# Patient Record
Sex: Male | Born: 1960
Health system: Southern US, Community
[De-identification: ages and names within clinical notes are randomized; demographics above are authoritative.]

## PROBLEM LIST (undated history)

## (undated) DIAGNOSIS — Z792 Long term (current) use of antibiotics: Secondary | ICD-10-CM

## (undated) DIAGNOSIS — J309 Allergic rhinitis, unspecified: Secondary | ICD-10-CM

## (undated) DIAGNOSIS — R011 Cardiac murmur, unspecified: Secondary | ICD-10-CM

## (undated) DIAGNOSIS — I447 Left bundle-branch block, unspecified: Secondary | ICD-10-CM

## (undated) DIAGNOSIS — Z7901 Long term (current) use of anticoagulants: Secondary | ICD-10-CM

## (undated) DIAGNOSIS — K635 Polyp of colon: Secondary | ICD-10-CM

## (undated) DIAGNOSIS — K219 Gastro-esophageal reflux disease without esophagitis: Secondary | ICD-10-CM

## (undated) DIAGNOSIS — F988 Other specified behavioral and emotional disorders with onset usually occurring in childhood and adolescence: Secondary | ICD-10-CM

## (undated) DIAGNOSIS — E785 Hyperlipidemia, unspecified: Secondary | ICD-10-CM

## (undated) DIAGNOSIS — E669 Obesity, unspecified: Secondary | ICD-10-CM

## (undated) DIAGNOSIS — I10 Essential (primary) hypertension: Secondary | ICD-10-CM

## (undated) DIAGNOSIS — F418 Other specified anxiety disorders: Secondary | ICD-10-CM

## (undated) DIAGNOSIS — G4730 Sleep apnea, unspecified: Secondary | ICD-10-CM

## (undated) DIAGNOSIS — N529 Male erectile dysfunction, unspecified: Secondary | ICD-10-CM

## (undated) DIAGNOSIS — E559 Vitamin D deficiency, unspecified: Secondary | ICD-10-CM

## (undated) DIAGNOSIS — R404 Transient alteration of awareness: Secondary | ICD-10-CM

## (undated) DIAGNOSIS — Z8709 Personal history of other diseases of the respiratory system: Secondary | ICD-10-CM

## (undated) DIAGNOSIS — I480 Paroxysmal atrial fibrillation: Secondary | ICD-10-CM

## (undated) DIAGNOSIS — I509 Heart failure, unspecified: Secondary | ICD-10-CM

## (undated) DIAGNOSIS — M1990 Unspecified osteoarthritis, unspecified site: Secondary | ICD-10-CM

## (undated) DIAGNOSIS — G43109 Migraine with aura, not intractable, without status migrainosus: Secondary | ICD-10-CM

## (undated) DIAGNOSIS — F40240 Claustrophobia: Secondary | ICD-10-CM

## (undated) DIAGNOSIS — I421 Obstructive hypertrophic cardiomyopathy: Secondary | ICD-10-CM

## (undated) DIAGNOSIS — I639 Cerebral infarction, unspecified: Secondary | ICD-10-CM

## (undated) DIAGNOSIS — Z952 Presence of prosthetic heart valve: Secondary | ICD-10-CM

## (undated) DIAGNOSIS — I499 Cardiac arrhythmia, unspecified: Secondary | ICD-10-CM

## (undated) DIAGNOSIS — Z5189 Encounter for other specified aftercare: Secondary | ICD-10-CM

## (undated) DIAGNOSIS — D689 Coagulation defect, unspecified: Secondary | ICD-10-CM

## (undated) DIAGNOSIS — G4733 Obstructive sleep apnea (adult) (pediatric): Secondary | ICD-10-CM

## (undated) DIAGNOSIS — T7840XA Allergy, unspecified, initial encounter: Secondary | ICD-10-CM

## (undated) HISTORY — DX: Long term (current) use of anticoagulants: Z79.01

## (undated) HISTORY — DX: Cerebral infarction, unspecified: I63.9

## (undated) HISTORY — DX: Heart failure, unspecified: I50.9

## (undated) HISTORY — DX: Gastro-esophageal reflux disease without esophagitis: K21.9

## (undated) HISTORY — DX: Claustrophobia: F40.240

## (undated) HISTORY — DX: Encounter for other specified aftercare: Z51.89

## (undated) HISTORY — DX: Vitamin D deficiency, unspecified: E55.9

## (undated) HISTORY — DX: Other specified behavioral and emotional disorders with onset usually occurring in childhood and adolescence: F98.8

## (undated) HISTORY — DX: Left bundle-branch block, unspecified: I44.7

## (undated) HISTORY — DX: Hyperlipidemia, unspecified: E78.5

## (undated) HISTORY — DX: Obstructive hypertrophic cardiomyopathy: I42.1

## (undated) HISTORY — DX: Personal history of other diseases of the respiratory system: Z87.09

## (undated) HISTORY — DX: Essential (primary) hypertension: I10

## (undated) HISTORY — DX: Long term (current) use of antibiotics: Z79.2

## (undated) HISTORY — DX: Presence of prosthetic heart valve: Z95.2

## (undated) HISTORY — DX: Paroxysmal atrial fibrillation: I48.0

## (undated) HISTORY — DX: Cardiac arrhythmia, unspecified: I49.9

## (undated) HISTORY — DX: Unspecified osteoarthritis, unspecified site: M19.90

## (undated) HISTORY — DX: Migraine with aura, not intractable, without status migrainosus: G43.109

## (undated) HISTORY — DX: Male erectile dysfunction, unspecified: N52.9

## (undated) HISTORY — PX: KNEE ARTHROSCOPY: SUR90

## (undated) HISTORY — DX: Polyp of colon: K63.5

## (undated) HISTORY — DX: Allergy, unspecified, initial encounter: T78.40XA

## (undated) HISTORY — PX: EYE SURGERY: SHX253

## (undated) HISTORY — DX: Allergic rhinitis, unspecified: J30.9

## (undated) HISTORY — DX: Cardiac murmur, unspecified: R01.1

## (undated) HISTORY — DX: Obesity, unspecified: E66.9

## (undated) HISTORY — DX: Sleep apnea, unspecified: G47.30

## (undated) HISTORY — DX: Obstructive sleep apnea (adult) (pediatric): G47.33

## (undated) HISTORY — DX: Coagulation defect, unspecified: D68.9

## (undated) HISTORY — DX: Other specified anxiety disorders: F41.8

## (undated) HISTORY — DX: Transient alteration of awareness: R40.4

---
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
## 1985-06-19 HISTORY — PX: INGUINAL HERNIA REPAIR: SUR1180

## 1990-06-19 HISTORY — PX: VASECTOMY: SHX75

## 2009-04-19 DIAGNOSIS — K635 Polyp of colon: Secondary | ICD-10-CM

## 2009-04-19 HISTORY — PX: COLONOSCOPY: SHX174

## 2009-04-19 HISTORY — DX: Polyp of colon: K63.5

## 2012-01-17 ENCOUNTER — Encounter: Payer: Self-pay | Admitting: Family Medicine

## 2012-01-17 ENCOUNTER — Ambulatory Visit (INDEPENDENT_AMBULATORY_CARE_PROVIDER_SITE_OTHER): Payer: PRIVATE HEALTH INSURANCE | Admitting: Family Medicine

## 2012-01-17 VITALS — BP 110/70 | HR 88 | Temp 98.0°F | Ht 66.5 in | Wt 241.2 lb

## 2012-01-17 DIAGNOSIS — E559 Vitamin D deficiency, unspecified: Secondary | ICD-10-CM

## 2012-01-17 DIAGNOSIS — I10 Essential (primary) hypertension: Secondary | ICD-10-CM

## 2012-01-17 DIAGNOSIS — F341 Dysthymic disorder: Secondary | ICD-10-CM

## 2012-01-17 DIAGNOSIS — I421 Obstructive hypertrophic cardiomyopathy: Secondary | ICD-10-CM

## 2012-01-17 DIAGNOSIS — F4323 Adjustment disorder with mixed anxiety and depressed mood: Secondary | ICD-10-CM | POA: Insufficient documentation

## 2012-01-17 DIAGNOSIS — G4733 Obstructive sleep apnea (adult) (pediatric): Secondary | ICD-10-CM

## 2012-01-17 DIAGNOSIS — G43109 Migraine with aura, not intractable, without status migrainosus: Secondary | ICD-10-CM | POA: Insufficient documentation

## 2012-01-17 DIAGNOSIS — J309 Allergic rhinitis, unspecified: Secondary | ICD-10-CM

## 2012-01-17 DIAGNOSIS — F418 Other specified anxiety disorders: Secondary | ICD-10-CM | POA: Insufficient documentation

## 2012-01-17 DIAGNOSIS — E785 Hyperlipidemia, unspecified: Secondary | ICD-10-CM

## 2012-01-17 MED ORDER — SIMVASTATIN 20 MG PO TABS: 20.0000 mg | ORAL_TABLET | Freq: Every day | ORAL | Status: DC

## 2012-01-17 MED ORDER — VALSARTAN 40 MG PO TABS: 40.0000 mg | ORAL_TABLET | Freq: Every day | ORAL | Status: DC

## 2012-01-17 NOTE — Assessment & Plan Note (Signed)
Sounds like classic migraines with aura.  Pt states he will continue to monitor for now, if worsening to return for further evaluation.

## 2012-01-17 NOTE — Assessment & Plan Note (Signed)
Followed by psych. Seems stable, slowly titrating off meds.  Prescribed stimulant by psych.

## 2012-01-17 NOTE — Progress Notes (Signed)
Subjective:    Patient ID: Dylan Fletcher, male    DOB: [birth year], 51 y.o.   MRN: [id number]  HPI CC: new pt to establish  HTN - compliant with meds.  No HA, vision changes, CP/tightness, SOB, leg swelling.   H/o HOCM.  Cards - Dr. Annalee Genta near [place], continues to see him, sees yearly.  No surgeries needed up to now.  States asymptomatic from this.  Occasional arrhythmias.  Some lightheadedness with walking up flight of stairs - this has been present since started taking metoprolol 10 years ago.  Told to push through this, has been encouraged to exercise.  No dyspnea with this or chest pain/tightness.  HLD - taking simvastatin in am.  No myalgias. Out of simvastatin for last few months.  H/o migraines with aura - gets one every several weeks.  Has not been on triptans or other abortive meds.  Resolves migraine with aleve and sleep.  Anxiety - seeing psych, counselor, stable from this.  Slowly coming off meds.  recenty came off cymbalta, now coming off wellbutrin.  Sees psych - [name] (in [place], [place]) every 6 months.  Prescribed adderall by them.  Preventative: Last CPE thinks >1 yr ago Colon screening - colonoscopy done 2011, 1 polyp, rec rpt 3-5 yrs (Dr. Susanne Greenhouse [place]).  Caffeine: quart of soda/day Lives with wife, 3 cats Occupation: Futures trader Edu: Bachelor's degree Activity: golf, walking, mowing lawn Diet: good water, fruits/vegetables daily  Medications and allergies reviewed and updated in chart.  Past histories reviewed and updated if relevant as below. There is no problem list on file for this patient.  Past Medical History  Diagnosis Date  . History of asthma     as child  . Anxiety associated with depression     sees psych in [place], [place] (meds from there)  . Allergic rhinitis     Ragweed, mold, mildew  . Arrhythmia   . HTN (hypertension)   . HLD (hyperlipidemia)   . Migraine with aura   . Colon polyp     thinks colonoscopy ~2011, rec rpt 3  yrs  . Sleep apnea     CPAP at night, up to 16 mmHg  . Hypertrophic obstructive cardiomyopathy     Cards (Dr. Marcha Dutton) in [place], [place]   Past Surgical History  Procedure Date  . Eye surgery 1969; 1971  . Wisdom tooth extraction 1977  . Inguinal hernia repair 1987  . Vasectomy 1992  . Knee arthroscopy 2006; 2007    right   History  Substance Use Topics  . Smoking status: Never Smoker   . Smokeless tobacco: Never Used  . Alcohol Use: Yes     1-2 drinks/weekly   Family History  Problem Relation Age of Onset  . Alcohol abuse Brother   . Cancer Mother 51    breast  . Psoriasis Father   . Ulcers Mother   . Heart disease Mother     HOCM  . Cancer Maternal Grandfather     colon  . Coronary artery disease Paternal Grandfather 90    MI  . Diabetes Neg Hx    No Known Allergies Current Outpatient Prescriptions on File Prior to Visit  Medication Sig Dispense Refill  . metoprolol succinate (TOPROL-XL) 50 MG 24 hr tablet Take 50 mg by mouth daily. Take with or immediately following a meal.      . amphetamine-dextroamphetamine (ADDERALL) 30 MG tablet Take 30 mg by mouth as directed. 30 mg QAM; 30 mg  lunchtime; 15 mg late afternoon      . buPROPion (WELLBUTRIN XL) 150 MG 24 hr tablet Take 150 mg by mouth daily.      . simvastatin (ZOCOR) 20 MG tablet Take 20 mg by mouth at bedtime.       . valsartan (DIOVAN) 40 MG tablet Take 40 mg by mouth daily.         Review of Systems  Constitutional: Negative for fever, chills, activity change, appetite change, fatigue and unexpected weight change.  HENT: Negative for hearing loss and neck pain.   Eyes: Negative for visual disturbance.  Respiratory: Negative for cough, chest tightness, shortness of breath and wheezing.   Cardiovascular: Positive for leg swelling (better if avoids sodium). Negative for chest pain and palpitations.  Gastrointestinal: Negative for nausea, vomiting, abdominal pain, diarrhea, constipation, blood in stool and  abdominal distention.  Genitourinary: Negative for hematuria and difficulty urinating.  Musculoskeletal: Negative for myalgias and arthralgias.  Skin: Negative for rash.  Neurological: Positive for dizziness and headaches. Negative for seizures and syncope.  Hematological: Does not bruise/bleed easily.  Psychiatric/Behavioral: Negative for dysphoric mood. The patient is not nervous/anxious.        Objective:   Physical Exam  Nursing note and vitals reviewed. Constitutional: He is oriented to person, place, and time. He appears well-developed and well-nourished. No distress.       obese  HENT:  Head: Normocephalic and atraumatic.  Right Ear: Hearing, tympanic membrane, external ear and ear canal normal.  Left Ear: Hearing, tympanic membrane, external ear and ear canal normal.  Nose: Nose normal.  Mouth/Throat: Oropharynx is clear and moist. No oropharyngeal exudate.  Eyes: Conjunctivae and EOM are normal. Pupils are equal, round, and reactive to light. No scleral icterus.  Neck: Normal range of motion. Neck supple. No thyromegaly present.  Cardiovascular: Normal rate, regular rhythm and intact distal pulses.   Murmur (4/6 SEM best at [facility]) heard. Pulses:      Radial pulses are 2+ on the right side, and 2+ on the left side.  Pulmonary/Chest: Effort normal and breath sounds normal. No respiratory distress. He has no wheezes. He has no rales.  Abdominal: Soft. Bowel sounds are normal. He exhibits no distension and no mass. There is no tenderness. There is no rebound and no guarding.  Musculoskeletal: Normal range of motion. He exhibits no edema.  Lymphadenopathy:    He has no cervical adenopathy.  Neurological: He is alert and oriented to person, place, and time.       CN grossly intact, station and gait intact  Skin: Skin is warm and dry. No rash noted.  Psychiatric: He has a normal mood and affect. His behavior is normal. Judgment and thought content normal.      Assessment &  Plan:

## 2012-01-17 NOTE — Assessment & Plan Note (Signed)
Chronic. Stable on CPAP

## 2012-01-17 NOTE — Assessment & Plan Note (Signed)
Chronic, stable.  Continue med.  Good control.

## 2012-01-17 NOTE — Assessment & Plan Note (Addendum)
Chronic, stable.  Check FLP next fasting blood work. On fish oil and zocor. Have requested records from prior PCP

## 2012-01-17 NOTE — Patient Instructions (Signed)
Return at your convenience for physical.  Check fasting blood work a week prior to physical. Good to see you today, call us with questions. I will await records from prior physicians.

## 2012-01-17 NOTE — Assessment & Plan Note (Signed)
Have requested records from cards. Stable from cardiac standpoint, asxs.

## 2012-01-22 LAB — CBC
Hemoglobin: 15.5 g/dL (ref 13.5–17.5)
WBC: 8.6
platelet count: 256

## 2012-01-22 LAB — LIPID PANEL: Cholesterol: 187 mg/dL (ref 0–200)

## 2012-01-22 LAB — COMPREHENSIVE METABOLIC PANEL
ALT: 39 U/L (ref 10–40)
AST: 26 U/L
Alkaline Phosphatase: 53 U/L
Creat: 1.15
Total Bilirubin: 0.6 mg/dL

## 2012-02-01 ENCOUNTER — Encounter: Payer: Self-pay | Admitting: Family Medicine

## 2012-02-05 ENCOUNTER — Encounter: Payer: Self-pay | Admitting: Family Medicine

## 2012-02-06 ENCOUNTER — Ambulatory Visit (INDEPENDENT_AMBULATORY_CARE_PROVIDER_SITE_OTHER): Payer: PRIVATE HEALTH INSURANCE | Admitting: Family Medicine

## 2012-02-06 ENCOUNTER — Encounter: Payer: Self-pay | Admitting: Family Medicine

## 2012-02-06 VITALS — BP 140/84 | HR 76 | Temp 99.2°F | Ht 65.75 in | Wt 240.0 lb

## 2012-02-06 DIAGNOSIS — I421 Obstructive hypertrophic cardiomyopathy: Secondary | ICD-10-CM

## 2012-02-06 DIAGNOSIS — E785 Hyperlipidemia, unspecified: Secondary | ICD-10-CM

## 2012-02-06 DIAGNOSIS — I10 Essential (primary) hypertension: Secondary | ICD-10-CM

## 2012-02-06 DIAGNOSIS — E669 Obesity, unspecified: Secondary | ICD-10-CM

## 2012-02-06 DIAGNOSIS — Z0000 Encounter for general adult medical examination without abnormal findings: Secondary | ICD-10-CM

## 2012-02-06 NOTE — Progress Notes (Signed)
Subjective:    Patient ID: Dylan Fletcher, male    DOB: 1960/12/15, 51 y.o.   MRN: [id number]  HPI CC: CPE  Off wellbutrin for last week, feels doing well mood wise.  HTN - bp slightly elevated today, but at work (where he checks) bp great to a bit low (100/50s).  Compliant with diovan 40mg  daily and toprol xl 50mg  daily.  HOCM - no HA, SOB, chest pain.  Wt Readings from Last 3 Encounters:  02/06/12 240 lb (108.863 kg)  01/17/12 241 lb 4 oz (109.43 kg)   Body mass index is 39.03 kg/(m^2).  Preventative: Colon screening - colonoscopy done 04/2009, 1 polyp, rec rpt 3-5 yrs (Dr. Susanne Greenhouse [place]). Prostate cancer screening - thinks had PSA done and normal last year.  Would like to hold off on screening for now. Nocturia x1.  Ok stream, attributes some weakness to adderall.  May consider repeating at age 22. Tetanus - unsure.  Would like to hold off for now.  Will check into old records.  Caffeine: cutting back - 12-14 oz soda/day Lives with wife, 3 cats Occupation: Futures trader Edu: Bachelor's degree Activity: golf, walking, mowing lawn Diet: good water, fruits/vegetables daily  Medications and allergies reviewed and updated in chart.  Past histories reviewed and updated if relevant as below. Patient Active Problem List  Diagnosis  . Vitamin d deficiency  . Hypertrophic obstructive cardiomyopathy  . OSA (obstructive sleep apnea)  . Migraine with aura  . HLD (hyperlipidemia)  . HTN (hypertension)  . Allergic rhinitis  . Anxiety associated with depression   Past Medical History  Diagnosis Date  . History of asthma     as child  . Anxiety associated with depression     sees psych in [place], [place] (meds from there)  . Allergic rhinitis     Ragweed, mold, mildew  . Arrhythmia   . HTN (hypertension)   . HLD (hyperlipidemia)   . Migraine with aura   . Colon polyp 04/2009    rectal tubular adenoma, rec rpt 3 yrs  . OSA (obstructive sleep apnea)     CPAP at night, up to  16 mmHg  . Hypertrophic obstructive cardiomyopathy     Cards (Dr. Marcha Dutton) in [place], [place]  . Vitamin d deficiency   . ADD (attention deficit disorder)     sees psych in [place], [place] (meds from there)   Past Surgical History  Procedure Date  . Eye surgery 1969; 1971  . Wisdom tooth extraction 1977  . Inguinal hernia repair 1987  . Vasectomy 1992  . Knee arthroscopy 2006; 2007    right  . Colonoscopy 04/2009    rectal tubular adenoma x1    History  Substance Use Topics  . Smoking status: Never Smoker   . Smokeless tobacco: Never Used  . Alcohol Use: Yes     1-2 drinks/weekly   Family History  Problem Relation Age of Onset  . Alcohol abuse Brother   . Cancer Mother 54    breast  . Psoriasis Father   . Ulcers Mother   . Heart disease Mother     HOCM  . Cancer Maternal Grandfather     colon  . Coronary artery disease Paternal Grandfather 69    MI  . Diabetes Neg Hx    No Known Allergies Current Outpatient Prescriptions on File Prior to Visit  Medication Sig Dispense Refill  . amphetamine-dextroamphetamine (ADDERALL) 30 MG tablet Take 30 mg by mouth as directed. 30 mg  QAM; 30 mg lunchtime; 15 mg late afternoon      . Cholecalciferol (D3 SUPER STRENGTH) 2000 UNITS CAPS Take 4,000 Units by mouth daily.      Marland Kitchen GLUCOSAMINE PO Take by mouth daily.      . L-Glutamine 500 MG CAPS Take 1,000 mg by mouth daily.      . metoprolol succinate (TOPROL-XL) 50 MG 24 hr tablet Take 50 mg by mouth daily. Take with or immediately following a meal.      . Omega-3 Fatty Acids (FISH OIL) 1200 MG CAPS Take 1 capsule by mouth daily.      . simvastatin (ZOCOR) 20 MG tablet Take 1 tablet (20 mg total) by mouth at bedtime.  90 tablet  3  . valsartan (DIOVAN) 40 MG tablet Take 1 tablet (40 mg total) by mouth daily.  90 tablet  3   Review of Systems  Constitutional: Negative for fever, chills, activity change, appetite change, fatigue and unexpected weight change.  HENT: Negative for hearing loss  and neck pain.   Eyes: Negative for visual disturbance.  Respiratory: Negative for cough, chest tightness, shortness of breath and wheezing.   Cardiovascular: Positive for leg swelling (better if avoids sodium). Negative for chest pain and palpitations.  Gastrointestinal: Positive for diarrhea (recent stomach flu). Negative for nausea, vomiting, abdominal pain, constipation, blood in stool and abdominal distention.  Genitourinary: Negative for hematuria and difficulty urinating.  Musculoskeletal: Negative for myalgias and arthralgias.  Skin: Negative for rash.  Neurological: Positive for dizziness and headaches. Negative for seizures and syncope.  Hematological: Does not bruise/bleed easily.  Psychiatric/Behavioral: Negative for dysphoric mood. The patient is not nervous/anxious.        Objective:   Physical Exam  Nursing note and vitals reviewed. Constitutional: He is oriented to person, place, and time. He appears well-developed and well-nourished. No distress.       obese  HENT:  Head: Normocephalic and atraumatic.  Right Ear: External ear normal.  Left Ear: External ear normal.  Nose: Nose normal.  Mouth/Throat: Oropharynx is clear and moist. No oropharyngeal exudate.  Eyes: Conjunctivae and EOM are normal. Pupils are equal, round, and reactive to light. No scleral icterus.  Neck: Normal range of motion. Neck supple. Carotid bruit is not present.  Cardiovascular: Normal rate, regular rhythm and intact distal pulses.   Murmur (3/6 SEM) heard. Pulses:      Radial pulses are 2+ on the right side, and 2+ on the left side.  Pulmonary/Chest: Effort normal and breath sounds normal. No respiratory distress. He has no wheezes. He has no rales.  Abdominal: Soft. Bowel sounds are normal. He exhibits no distension and no mass. There is no tenderness. There is no rebound and no guarding.  Genitourinary:       deferred  Musculoskeletal: Normal range of motion. He exhibits no edema.    Lymphadenopathy:    He has no cervical adenopathy.  Neurological: He is alert and oriented to person, place, and time.       CN grossly intact, station and gait intact  Skin: Skin is warm and dry. No rash noted.  Psychiatric: He has a normal mood and affect. His behavior is normal. Judgment and thought content normal.       Assessment & Plan:

## 2012-02-06 NOTE — Patient Instructions (Addendum)
Return in 1 year for next physical. Keep eye on blood pressure - if too low or too high, let me know. Good to see you today, call us with questions. Check into latest tetanus shot and latest PSA.

## 2012-02-07 ENCOUNTER — Encounter: Payer: Self-pay | Admitting: Family Medicine

## 2012-02-07 DIAGNOSIS — E669 Obesity, unspecified: Secondary | ICD-10-CM | POA: Insufficient documentation

## 2012-02-07 DIAGNOSIS — Z0000 Encounter for general adult medical examination without abnormal findings: Secondary | ICD-10-CM | POA: Insufficient documentation

## 2012-02-07 NOTE — Assessment & Plan Note (Signed)
Chronic, stable.  bp slightly elevated today, however states at home /work stays lower.  No changes today.

## 2012-02-07 NOTE — Assessment & Plan Note (Signed)
Reviewed blood work - stable.  Encouraged increased aerobic exercise to raise HDL.

## 2012-02-07 NOTE — Assessment & Plan Note (Signed)
Preventative protocols reviewed and updated unless pt declined. Discussed healthy diet/lifestyle. Encouraged increased activity to affect weight loss. Body mass index is 39.03 kg/(m^2).

## 2012-02-07 NOTE — Assessment & Plan Note (Signed)
Chronic, stable, asxs.

## 2012-02-07 NOTE — Assessment & Plan Note (Signed)
Discussed healthy diet/lifestyle to affect weight loss.

## 2012-06-19 HISTORY — PX: CARDIAC CATHETERIZATION: SHX172

## 2012-07-31 ENCOUNTER — Telehealth: Payer: Self-pay | Admitting: Family Medicine

## 2012-07-31 NOTE — Telephone Encounter (Signed)
Noted  

## 2012-07-31 NOTE — Telephone Encounter (Signed)
Patient Information:  Caller Name: Terin  Phone: [phone]  Patient: Dylan Fletcher, Dylan Fletcher  Gender: Male  DOB: August 18, 1960  Age: 52 Years  PCP: [facility])  Office Follow Up:  Does the office need to follow up with this patient?: No  Instructions For The Office: N/A  RN Note:  [facility]  Symptoms  Reason For Call & Symptoms: Colld symptoms  onset 2/10/ 14 and "pink eye" 07/31/12.  Afebrile/subjective.  Left eye with light yellowish discharge upon waking up with eyelashes stuck shut.  Marland Kitchen .States symptoms are just starting and he is worried that he will get snowed in.  Occasional cough.  Emergent symptoms ruled out.  Home care and standing order for Polytrim eye drops  Reviewed Health History In EMR: Yes  Reviewed Medications In EMR: Yes  Reviewed Allergies In EMR: Yes  Reviewed Surgeries / Procedures: Yes  Date of Onset of Symptoms: 07/29/2012  Treatments Tried: Pseudoephedrine HCL  Treatments Tried Worked: No  Guideline(s) Used:  Eye - Pus or Discharge  Disposition Per Guideline:   Home Care  Reason For Disposition Reached:   Eye with yellow/green discharge or eyelashes stick together, and PCP standing order to call in antibiotic eye drops  Advice Given:  N/A

## 2013-01-06 ENCOUNTER — Telehealth: Payer: Self-pay | Admitting: Family Medicine

## 2013-01-06 NOTE — Telephone Encounter (Signed)
Pt can get his bloodwork drawn at his work for free and would like to do this but he does not know what tests he needs to have drawn.  He is having his CPE next month (August) but would like to get the fasting labwork done now and bring it with him to the appt. Please call pt.

## 2013-01-07 ENCOUNTER — Other Ambulatory Visit: Payer: Self-pay | Admitting: Family Medicine

## 2013-01-07 NOTE — Telephone Encounter (Signed)
Message left notifying patient. Order mailed to patient just in case he needs it.

## 2013-01-07 NOTE — Telephone Encounter (Signed)
Please advise what labs you would like for him to have. He does not need a written order.

## 2013-01-07 NOTE — Telephone Encounter (Signed)
Wrote out order for patient and placed in Kim's box. CBC, FLP, BMP, Vit D

## 2013-01-28 LAB — LIPID PANEL
Cholesterol: 202 mg/dL — AB (ref 0–200)
Triglycerides: 160

## 2013-01-28 LAB — COMPREHENSIVE METABOLIC PANEL
ALT: 35 U/L (ref 10–40)
AST: 30 U/L
Alkaline Phosphatase: 61 U/L
BUN: 15 mg/dL (ref 4–21)
Total Bilirubin: 0.6 mg/dL

## 2013-01-28 LAB — CBC
HGB: 15.3 g/dL
WBC: 8.8

## 2013-01-28 LAB — TSH: TSH: 1.48

## 2013-02-06 ENCOUNTER — Encounter: Payer: Self-pay | Admitting: Gastroenterology

## 2013-02-06 ENCOUNTER — Ambulatory Visit (INDEPENDENT_AMBULATORY_CARE_PROVIDER_SITE_OTHER): Payer: PRIVATE HEALTH INSURANCE | Admitting: Family Medicine

## 2013-02-06 ENCOUNTER — Encounter: Payer: Self-pay | Admitting: Family Medicine

## 2013-02-06 VITALS — BP 122/86 | HR 78 | Temp 98.7°F | Ht 66.0 in | Wt 239.5 lb

## 2013-02-06 DIAGNOSIS — I10 Essential (primary) hypertension: Secondary | ICD-10-CM

## 2013-02-06 DIAGNOSIS — Z0000 Encounter for general adult medical examination without abnormal findings: Secondary | ICD-10-CM

## 2013-02-06 DIAGNOSIS — E559 Vitamin D deficiency, unspecified: Secondary | ICD-10-CM

## 2013-02-06 DIAGNOSIS — Z86010 Personal history of colonic polyps: Secondary | ICD-10-CM

## 2013-02-06 DIAGNOSIS — Z23 Encounter for immunization: Secondary | ICD-10-CM

## 2013-02-06 DIAGNOSIS — E785 Hyperlipidemia, unspecified: Secondary | ICD-10-CM

## 2013-02-06 DIAGNOSIS — N529 Male erectile dysfunction, unspecified: Secondary | ICD-10-CM

## 2013-02-06 DIAGNOSIS — I421 Obstructive hypertrophic cardiomyopathy: Secondary | ICD-10-CM

## 2013-02-06 MED ORDER — SILDENAFIL CITRATE 100 MG PO TABS: 100.0000 mg | ORAL_TABLET | Freq: Every day | ORAL | Status: DC | PRN

## 2013-02-06 NOTE — Progress Notes (Signed)
Subjective:    Patient ID: Dylan Fletcher, male    DOB: 1961-01-13, 52 y.o.   MRN: [id number]  HPI CC: CPE  ED - would like refill of viagra.  Works well for him.  Thinks he has 100mg  at home.    H/o HOCM - seeing [facility] Dr. Marcha Dutton and possibly Dr. Danae Orleans.  Working on increasing exercise - biking, walking.  Considering martial arts.  Lightheaded at initiation of exercise.  Pending f/u treadmill stress test by cardiologist.  Wt Readings from Last 3 Encounters:  02/06/13 239 lb 8 oz (108.636 kg)  02/06/12 240 lb (108.863 kg)  01/17/12 241 lb 4 oz (109.43 kg)  Body mass index is 38.67 kg/(m^2).    Preventative:  Colon screening - colonoscopy done 04/2009, 1 polyp, rec rpt 3-5 yrs (Dr. Susanne Greenhouse [place]).  Pt wants to undergo repeat colonoscopy with local gastroenterologist - wants to have lighter prep to avoid dehydration. Prostate cancer screening - PSA normal.  Will check DRE today.  Nocturia x1. Ok stream, attributes some weakness to adderall. May consider repeating at age 5.  Tetanus - 2004.  Due for repeat.  Caffeine: cutting back - 12-14 oz soda/day  Lives with wife, 3 cats  Occupation: Futures trader  Edu: Bachelor's degree  Activity: golf, walking, mowing lawn, biking Diet: good water, fruits/vegetables daily   Medications and allergies reviewed and updated in chart.  Past histories reviewed and updated if relevant as below. Patient Active Problem List   Diagnosis Date Noted  . Healthcare maintenance 02/07/2012  . Obesity   . Vitamin d deficiency   . Hypertrophic obstructive cardiomyopathy   . OSA (obstructive sleep apnea)   . Migraine with aura   . HLD (hyperlipidemia)   . HTN (hypertension)   . Allergic rhinitis   . Anxiety associated with depression    Past Medical History  Diagnosis Date  . History of asthma     as child  . Anxiety associated with depression     sees psych in [place], [place] (meds from there)  . Allergic rhinitis     Ragweed,  mold, mildew  . Arrhythmia   . HTN (hypertension)   . HLD (hyperlipidemia)   . Migraine with aura   . Colon polyp 04/2009    rectal tubular adenoma, rec rpt 3 yrs  . OSA (obstructive sleep apnea)     CPAP at night, up to 16 mmHg  . Hypertrophic obstructive cardiomyopathy(425.11)     Cards (Dr. Marcha Dutton) in [place], [place]  . Vitamin D deficiency   . ADD (attention deficit disorder)     sees psych in [place], [place] (meds from there)  . Obesity    Past Surgical History  Procedure Laterality Date  . Eye surgery  1969; 1971  . Wisdom tooth extraction  1977  . Inguinal hernia repair  1987  . Vasectomy  1992  . Knee arthroscopy  2006; 2007    right  . Colonoscopy  04/2009    rectal tubular adenoma x1    History  Substance Use Topics  . Smoking status: Never Smoker   . Smokeless tobacco: Never Used  . Alcohol Use: Yes     Comment: 1-2 drinks/weekly   Family History  Problem Relation Age of Onset  . Alcohol abuse Brother   . Cancer Mother 59    breast  . Psoriasis Father   . Ulcers Mother   . Heart disease Mother     HOCM  . Cancer Maternal Grandfather  colon  . Coronary artery disease Paternal Grandfather 50    MI  . Diabetes Neg Hx    No Known Allergies Current Outpatient Prescriptions on File Prior to Visit  Medication Sig Dispense Refill  . amphetamine-dextroamphetamine (ADDERALL) 30 MG tablet Take 30 mg by mouth as directed. 30 mg QAM; 30 mg lunchtime; 15 mg late afternoon      . Cholecalciferol (D3 SUPER STRENGTH) 2000 UNITS CAPS Take 4,000 Units by mouth daily.      . metoprolol succinate (TOPROL-XL) 50 MG 24 hr tablet Take 50 mg by mouth daily. Take with or immediately following a meal.      . Omega-3 Fatty Acids (FISH OIL) 1200 MG CAPS Take 1 capsule by mouth daily.      . simvastatin (ZOCOR) 20 MG tablet TAKE ONE TABLET BY MOUTH NIGHTLY AT BEDTIME  30 tablet  0  . GLUCOSAMINE PO Take by mouth as needed.        No current facility-administered medications on  file prior to visit.     Review of Systems  Constitutional: Negative for fever, chills, activity change, appetite change, fatigue and unexpected weight change.  HENT: Positive for congestion, rhinorrhea and sneezing. Negative for hearing loss and neck pain.   Eyes: Negative for visual disturbance.  Respiratory: Positive for cough (allergy attributed). Negative for chest tightness, shortness of breath and wheezing.   Cardiovascular: Negative for chest pain, palpitations and leg swelling.  Gastrointestinal: Negative for nausea, vomiting, abdominal pain, diarrhea, constipation, blood in stool and abdominal distention.  Genitourinary: Negative for hematuria and difficulty urinating.  Musculoskeletal: Negative for myalgias and arthralgias.  Skin: Negative for rash.  Neurological: Negative for dizziness, seizures, syncope and headaches.  Hematological: Negative for adenopathy. Does not bruise/bleed easily.  Psychiatric/Behavioral: Negative for dysphoric mood. The patient is not nervous/anxious.        Objective:   Physical Exam  Nursing note and vitals reviewed. Constitutional: He is oriented to person, place, and time. He appears well-developed and well-nourished. No distress.  HENT:  Head: Normocephalic and atraumatic.  Right Ear: Hearing, tympanic membrane, external ear and ear canal normal.  Left Ear: Hearing, tympanic membrane, external ear and ear canal normal.  Nose: Nose normal.  Mouth/Throat: Oropharynx is clear and moist. No oropharyngeal exudate.  Eyes: Conjunctivae and EOM are normal. Pupils are equal, round, and reactive to light. No scleral icterus.  Neck: Normal range of motion. Neck supple. No thyromegaly present.  Cardiovascular: Normal rate, regular rhythm, normal heart sounds and intact distal pulses.   No murmur heard. Pulses:      Radial pulses are 2+ on the right side, and 2+ on the left side.  Pulmonary/Chest: Effort normal and breath sounds normal. No respiratory  distress. He has no wheezes. He has no rales.  Abdominal: Soft. Bowel sounds are normal. He exhibits no distension and no mass. There is no tenderness. There is no rebound and no guarding.  Genitourinary: Rectum normal and prostate normal. Rectal exam shows no external hemorrhoid, no internal hemorrhoid, no fissure, no mass, no tenderness and anal tone normal. Prostate is not enlarged (15-20gm) and not tender.  Musculoskeletal: Normal range of motion. He exhibits no edema.  Lymphadenopathy:    He has no cervical adenopathy.  Neurological: He is alert and oriented to person, place, and time.  CN grossly intact, station and gait intact  Skin: Skin is warm and dry. No rash noted.  Psychiatric: He has a normal mood and affect. His behavior is  normal. Judgment and thought content normal.       Assessment & Plan:

## 2013-02-06 NOTE — Assessment & Plan Note (Signed)
Chronic, stable. continue meds. BP Readings from Last 3 Encounters:  02/06/13 122/86  02/06/12 140/84  01/17/12 110/70

## 2013-02-06 NOTE — Assessment & Plan Note (Signed)
Chronic, stable. Continue meds. Encouraged healthy diet/lifestyle changes.

## 2013-02-06 NOTE — Assessment & Plan Note (Signed)
Preventative protocols reviewed and updated unless pt declined. Discussed healthy diet and lifestyle. Refer to GI as due for colonoscopy - with discussion with MD or PA prior to colonoscopy.

## 2013-02-06 NOTE — Addendum Note (Signed)
Addended by: Shon Millet on: 02/06/2013 04:25 PM   Modules accepted: Orders

## 2013-02-06 NOTE — Patient Instructions (Addendum)
Pass by Marion's office for referral to local gastroenterologist - I'd like you to talk to them prior to scheduling colonoscopy. Good to see you today, call us with questions. Return as needed or in 1 year for next physical. Tdap today (tetanus and pertussis).

## 2013-02-06 NOTE — Assessment & Plan Note (Signed)
Has restarted vit D.

## 2013-02-06 NOTE — Assessment & Plan Note (Signed)
Followed by cards °

## 2013-02-12 ENCOUNTER — Encounter: Payer: Self-pay | Admitting: Family Medicine

## 2013-02-13 DIAGNOSIS — R079 Chest pain, unspecified: Secondary | ICD-10-CM | POA: Insufficient documentation

## 2013-02-13 DIAGNOSIS — I421 Obstructive hypertrophic cardiomyopathy: Secondary | ICD-10-CM | POA: Insufficient documentation

## 2013-02-14 ENCOUNTER — Telehealth: Payer: Self-pay

## 2013-02-14 DIAGNOSIS — I421 Obstructive hypertrophic cardiomyopathy: Secondary | ICD-10-CM

## 2013-02-14 DIAGNOSIS — Z86010 Personal history of colonic polyps: Secondary | ICD-10-CM

## 2013-02-14 NOTE — Telephone Encounter (Signed)
Pt seen 02/06/13 and pt wants cb; pt has heart cath scheduled 02/20/13 after having pain following stress test; pt said had stress test 02/13/13  And during cool down on treadmill pt had extreme lt arm pain, EKG had changed pt was sent to [facility] ([facility] where cardiac enzymes were negative and pt was sent home. While at hospital pt had syncope episode due to pain while IV was being started. Today pt feels fine but stressed due to concern what caused pain on 02/13/13, pts mitral valve is also leaking. Pt wants to know if needs to put GI referral and colonoscopy on hold for now. Pt request cb. Pt was given Ntg and ASA 81 mg. Pt waiting on cb from cardiologist to verify how to take Ntg. Pt request cb.

## 2013-02-14 NOTE — Telephone Encounter (Signed)
Let's hold GI referral for now while heart is evaluated.  May cancel appt on our end- to call us when desires referral. NTG - can take 1 sublingual tablet prn chest pain, may repeat x 2.  If used, needs to seek urgent care.

## 2013-02-14 NOTE — Telephone Encounter (Signed)
Patient notified. GI appt cancelled and patient will call when ready to reschedule. Advised about NTG dosing and patient verbalized understanding.

## 2013-03-05 NOTE — Addendum Note (Signed)
Addended by: Eustaquio Boyden on: 03/05/2013 05:20 PM   Modules accepted: Orders

## 2013-03-05 NOTE — Telephone Encounter (Addendum)
Pt left v/m; pt has had some med changes since had heart cath. Pt also request referral for colonoscopy to see GI doctor. Called pt and heart cath showed no CAD and pt has no plaque build up and cardiologist stopped Simvastatin and low dose ASA and pt does not need nitro.Cardiologist said unless LDL goes up above 160 does not need to take statin. Pt understood due to time of day will not here today.

## 2013-03-05 NOTE — Telephone Encounter (Addendum)
Placed GI referral. Removed statin from list.

## 2013-03-06 ENCOUNTER — Encounter: Payer: Self-pay | Admitting: Gastroenterology

## 2013-03-06 NOTE — Telephone Encounter (Signed)
GI consult appt made and patient aware. MK

## 2013-03-11 ENCOUNTER — Ambulatory Visit: Payer: PRIVATE HEALTH INSURANCE | Admitting: Gastroenterology

## 2013-04-04 ENCOUNTER — Other Ambulatory Visit: Payer: Self-pay | Admitting: Family Medicine

## 2013-04-08 ENCOUNTER — Ambulatory Visit: Payer: PRIVATE HEALTH INSURANCE | Admitting: Gastroenterology

## 2013-05-09 ENCOUNTER — Ambulatory Visit: Payer: PRIVATE HEALTH INSURANCE | Admitting: Gastroenterology

## 2013-05-19 DIAGNOSIS — G4730 Sleep apnea, unspecified: Secondary | ICD-10-CM | POA: Insufficient documentation

## 2013-05-19 DIAGNOSIS — I480 Paroxysmal atrial fibrillation: Secondary | ICD-10-CM | POA: Insufficient documentation

## 2013-05-19 DIAGNOSIS — F909 Attention-deficit hyperactivity disorder, unspecified type: Secondary | ICD-10-CM | POA: Insufficient documentation

## 2013-05-27 ENCOUNTER — Encounter: Payer: Self-pay | Admitting: Family Medicine

## 2013-05-27 ENCOUNTER — Other Ambulatory Visit: Payer: Self-pay | Admitting: Family Medicine

## 2013-05-27 MED ORDER — ASPIRIN EC 325 MG PO TBEC: 325.0000 mg | DELAYED_RELEASE_TABLET | Freq: Every day | ORAL | Status: DC

## 2013-05-27 MED ORDER — FISH OIL 1200 MG PO CAPS: 1.0000 | ORAL_CAPSULE | Freq: Every day | ORAL | Status: DC

## 2013-06-17 ENCOUNTER — Telehealth: Payer: Self-pay

## 2013-06-17 ENCOUNTER — Encounter: Payer: Self-pay | Admitting: Family Medicine

## 2013-06-17 NOTE — Telephone Encounter (Signed)
Pt was discharged from [facility] today for afib and tachycardia. Pt will have a f/u appt with pts electrophysiologist in pts cardiology office on 07/10/13. Pt's med were changed while in hospital and pt wanted Dr Reece Agar to be aware. Pt is presently on Sotalol 80 mg twice a day, Xarelto 20 mg in evening.  ASA 325 mg, Diovan 40 mg, Metoprolol 100 mg were stopped. Pt said afib converted on 06/14/13 without cardioversion. Pt feels very tired. Resting heartbeat now in low 50's and high 40's. Pt request cb with Dr Timoteo Expose opinion and does Dr Reece Agar want pt to schedule appt with him.pt said a cb after Dr Timoteo Expose return on 06/26/13 will be OK. Pt said already has appt in cardiologist office and if has episode of tach or afib pt will go to ED.

## 2013-06-22 NOTE — Telephone Encounter (Signed)
Noted. plz update med list. Would suggest he schedule appt with me after he sees EP 07/10/2012, sooner if needed

## 2013-06-23 ENCOUNTER — Encounter: Payer: Self-pay | Admitting: Family Medicine

## 2013-06-23 NOTE — Telephone Encounter (Signed)
Appt scheduled and med list updated.

## 2013-07-07 ENCOUNTER — Encounter: Payer: Self-pay | Admitting: Family Medicine

## 2013-07-08 LAB — PULMONARY FUNCTION TEST

## 2013-07-10 ENCOUNTER — Encounter: Payer: Self-pay | Admitting: Family Medicine

## 2013-07-15 ENCOUNTER — Ambulatory Visit (INDEPENDENT_AMBULATORY_CARE_PROVIDER_SITE_OTHER): Payer: PRIVATE HEALTH INSURANCE | Admitting: Family Medicine

## 2013-07-15 ENCOUNTER — Encounter: Payer: Self-pay | Admitting: Family Medicine

## 2013-07-15 VITALS — BP 124/82 | HR 60 | Temp 98.1°F | Wt 236.5 lb

## 2013-07-15 DIAGNOSIS — G4733 Obstructive sleep apnea (adult) (pediatric): Secondary | ICD-10-CM

## 2013-07-15 DIAGNOSIS — Z8679 Personal history of other diseases of the circulatory system: Secondary | ICD-10-CM | POA: Insufficient documentation

## 2013-07-15 DIAGNOSIS — I480 Paroxysmal atrial fibrillation: Secondary | ICD-10-CM

## 2013-07-15 DIAGNOSIS — I4891 Unspecified atrial fibrillation: Secondary | ICD-10-CM

## 2013-07-15 DIAGNOSIS — I421 Obstructive hypertrophic cardiomyopathy: Secondary | ICD-10-CM

## 2013-07-15 NOTE — Progress Notes (Signed)
Pre-visit discussion using our clinic review tool. No additional management support is needed unless otherwise documented below in the visit note.  

## 2013-07-15 NOTE — Patient Instructions (Signed)
Good to see you today, call us with questions. We will await second opinion by Dr. Shearon Stalls in [place]. Ok to take amoxicillin around dental procedures. Return as needed.

## 2013-07-15 NOTE — Progress Notes (Signed)
Subjective:    Patient ID: Dylan Fletcher, male    DOB: 11-Nov-1960, 53 y.o.   MRN: [id number]  HPI CC: f/u [facility] hospitalization.  Dylan Fletcher presents today for f/u after several recent hospitalizations with atrial fibrillation presenting as crushing chest pain with tachycardia to 170s.  He is complicated by h/o HOCM.  Not controlled with Toprol XL Tried and failed sotalol (some QT issues) then multaq.  Currently loading on amiodarone. Also on xanax and percocets prn afib episodes with chest pain - but has not needed this and prefers not to use.  He did have catheterization in hospital without significant blockages. Since he's been home, no more chest pain or tachycardia episodes.  This all started while he was in [place] caring for father after a fall with fracture.  Has had 9 Afib episodes in last 2 months.   Last hospitalization last week at [facility].   Adderall is not felt to be contributing.  Has stopped caffeine.  EP - Dr. Manuella Ghazi and Dr. Carlena Bjornstad Cards - Dr. Evelina Bucy Pending 2nd opinion with Dr. Shearon Stalls in [facility] (one of leading HOCM docs in the country).  Pt requests abx prophylaxis prior to dental procedures despite new AHA guidelines - brings reference from HOCM expert Dr. Holley Bouche Will need to postpone colonoscopy.  Wt Readings from Last 3 Encounters:  07/15/13 236 lb 8 oz (107.276 kg)  02/06/13 239 lb 8 oz (108.636 kg)  02/06/12 240 lb (108.863 kg)    Past Medical History  Diagnosis Date  . History of asthma     as child  . Anxiety associated with depression     sees psych in [place], [place] (meds from there)  . Allergic rhinitis     Ragweed, mold, mildew  . Arrhythmia   . HTN (hypertension)   . HLD (hyperlipidemia)   . Migraine with aura   . Colon polyp 04/2009    rectal tubular adenoma, rec rpt 3 yrs  . OSA (obstructive sleep apnea)     CPAP at night, up to 16 mmHg  . Hypertrophic obstructive cardiomyopathy(425.11)     Cards (Dr. Evelina Bucy) in [place], [place] [phone])  .  Vitamin D deficiency   . ADD (attention deficit disorder)     sees psych in [place], [place] (meds from there)  . Obesity   . ED (erectile dysfunction)   . Paroxysmal atrial fibrillation 04/2013, 05/2013    with RVR; hospitalization thought due to stimulant, spontaneous conversion, referred to Dr. Manuella Ghazi EP, then recurrence - failed sotalol, now trial of multaq (both with spont conversions)    Past Surgical History  Procedure Laterality Date  . Eye surgery  1969; 1971  . Wisdom tooth extraction  1977  . Inguinal hernia repair  1987  . Vasectomy  1992  . Knee arthroscopy  2006; 2007    right  . Colonoscopy  04/2009    rectal tubular adenoma x1   . Cardiac catheterization  2014    done for chest pain, no plaque buildup  . Spirometry  06/2013    FVC 79%, FEV1 72%, ratio 0.71 - mild obstruction   Review of Systems Per HPI    Objective:   Physical Exam  Nursing note and vitals reviewed. Constitutional: He appears well-developed and well-nourished. No distress.  HENT:  Head: Normocephalic and atraumatic.  Mouth/Throat: Oropharynx is clear and moist. No oropharyngeal exudate.  Eyes: Conjunctivae and EOM are normal. Pupils are equal, round, and reactive to light. No scleral icterus.  Cardiovascular: Normal  rate, regular rhythm and intact distal pulses.   Murmur (4/6 holosystolic murmur) heard. Pulmonary/Chest: Effort normal and breath sounds normal. No respiratory distress. He has no wheezes. He has no rales.  Musculoskeletal: He exhibits no edema.       Assessment & Plan:

## 2013-07-15 NOTE — Assessment & Plan Note (Signed)
Continues compliant with CPAP.

## 2013-07-15 NOTE — Assessment & Plan Note (Signed)
Complicated history with several recent hospitalizations. Reviewed records and story. Amiodarone seems to be keeping afib under control. Continue this as well as metoprolol 50mg  xl

## 2013-07-15 NOTE — Assessment & Plan Note (Signed)
Planning 2nd opinion by Dr. Shearon Stalls at [facility].

## 2013-07-20 HISTORY — PX: OTHER SURGICAL HISTORY: SHX169

## 2013-08-10 ENCOUNTER — Encounter: Payer: Self-pay | Admitting: Family Medicine

## 2013-08-17 DIAGNOSIS — I639 Cerebral infarction, unspecified: Secondary | ICD-10-CM

## 2013-08-17 DIAGNOSIS — Z952 Presence of prosthetic heart valve: Secondary | ICD-10-CM | POA: Insufficient documentation

## 2013-08-17 DIAGNOSIS — I447 Left bundle-branch block, unspecified: Secondary | ICD-10-CM | POA: Insufficient documentation

## 2013-08-17 HISTORY — DX: Left bundle-branch block, unspecified: I44.7

## 2013-08-17 HISTORY — DX: Cerebral infarction, unspecified: I63.9

## 2013-08-17 HISTORY — DX: Presence of prosthetic heart valve: Z95.2

## 2013-08-17 HISTORY — PX: MITRAL VALVE REPLACEMENT: SHX147

## 2013-08-20 DIAGNOSIS — I10 Essential (primary) hypertension: Secondary | ICD-10-CM | POA: Insufficient documentation

## 2013-08-22 HISTORY — PX: MYOMECTOMY: SHX85

## 2013-08-24 DIAGNOSIS — I340 Nonrheumatic mitral (valve) insufficiency: Secondary | ICD-10-CM | POA: Insufficient documentation

## 2013-08-26 DIAGNOSIS — Z8619 Personal history of other infectious and parasitic diseases: Secondary | ICD-10-CM | POA: Insufficient documentation

## 2013-08-26 DIAGNOSIS — A0472 Enterocolitis due to Clostridium difficile, not specified as recurrent: Secondary | ICD-10-CM | POA: Insufficient documentation

## 2013-09-01 ENCOUNTER — Encounter: Payer: Self-pay | Admitting: Family Medicine

## 2013-09-02 DIAGNOSIS — R4701 Aphasia: Secondary | ICD-10-CM | POA: Insufficient documentation

## 2013-09-02 DIAGNOSIS — G8918 Other acute postprocedural pain: Secondary | ICD-10-CM | POA: Insufficient documentation

## 2013-09-02 DIAGNOSIS — J9811 Atelectasis: Secondary | ICD-10-CM | POA: Insufficient documentation

## 2013-09-03 DIAGNOSIS — I639 Cerebral infarction, unspecified: Secondary | ICD-10-CM | POA: Insufficient documentation

## 2013-09-05 DIAGNOSIS — I4719 Other supraventricular tachycardia: Secondary | ICD-10-CM | POA: Insufficient documentation

## 2013-09-05 DIAGNOSIS — I471 Supraventricular tachycardia: Secondary | ICD-10-CM | POA: Insufficient documentation

## 2013-09-06 DIAGNOSIS — R7881 Bacteremia: Secondary | ICD-10-CM | POA: Insufficient documentation

## 2013-09-09 DIAGNOSIS — Z9889 Other specified postprocedural states: Secondary | ICD-10-CM | POA: Insufficient documentation

## 2013-09-09 DIAGNOSIS — Z952 Presence of prosthetic heart valve: Secondary | ICD-10-CM | POA: Insufficient documentation

## 2013-09-12 ENCOUNTER — Other Ambulatory Visit: Payer: Self-pay | Admitting: Family Medicine

## 2013-09-12 ENCOUNTER — Encounter: Payer: Self-pay | Admitting: Family Medicine

## 2013-09-12 ENCOUNTER — Ambulatory Visit (INDEPENDENT_AMBULATORY_CARE_PROVIDER_SITE_OTHER): Payer: PRIVATE HEALTH INSURANCE | Admitting: Family Medicine

## 2013-09-12 VITALS — BP 114/70 | HR 76 | Temp 97.3°F | Wt 222.5 lb

## 2013-09-12 DIAGNOSIS — I69920 Aphasia following unspecified cerebrovascular disease: Secondary | ICD-10-CM

## 2013-09-12 DIAGNOSIS — I6930 Unspecified sequelae of cerebral infarction: Secondary | ICD-10-CM | POA: Insufficient documentation

## 2013-09-12 DIAGNOSIS — D472 Monoclonal gammopathy: Secondary | ICD-10-CM | POA: Insufficient documentation

## 2013-09-12 DIAGNOSIS — I10 Essential (primary) hypertension: Secondary | ICD-10-CM

## 2013-09-12 DIAGNOSIS — I421 Obstructive hypertrophic cardiomyopathy: Secondary | ICD-10-CM

## 2013-09-12 DIAGNOSIS — I639 Cerebral infarction, unspecified: Secondary | ICD-10-CM

## 2013-09-12 DIAGNOSIS — D72829 Elevated white blood cell count, unspecified: Secondary | ICD-10-CM | POA: Insufficient documentation

## 2013-09-12 DIAGNOSIS — I6350 Cerebral infarction due to unspecified occlusion or stenosis of unspecified cerebral artery: Secondary | ICD-10-CM

## 2013-09-12 DIAGNOSIS — I4891 Unspecified atrial fibrillation: Secondary | ICD-10-CM

## 2013-09-12 DIAGNOSIS — Z952 Presence of prosthetic heart valve: Secondary | ICD-10-CM

## 2013-09-12 DIAGNOSIS — Z7901 Long term (current) use of anticoagulants: Secondary | ICD-10-CM

## 2013-09-12 DIAGNOSIS — D72820 Lymphocytosis (symptomatic): Secondary | ICD-10-CM | POA: Insufficient documentation

## 2013-09-12 DIAGNOSIS — I480 Paroxysmal atrial fibrillation: Secondary | ICD-10-CM

## 2013-09-12 DIAGNOSIS — E785 Hyperlipidemia, unspecified: Secondary | ICD-10-CM

## 2013-09-12 DIAGNOSIS — Z954 Presence of other heart-valve replacement: Secondary | ICD-10-CM

## 2013-09-12 DIAGNOSIS — I69320 Aphasia following cerebral infarction: Secondary | ICD-10-CM | POA: Insufficient documentation

## 2013-09-12 LAB — BASIC METABOLIC PANEL
BUN: 21 mg/dL (ref 6–23)
CALCIUM: 8.9 mg/dL (ref 8.4–10.5)
CHLORIDE: 105 meq/L (ref 96–112)
CO2: 25 meq/L (ref 19–32)
Creatinine, Ser: 1.2 mg/dL (ref 0.4–1.5)
GFR: 67.56 mL/min (ref >60.00)
GLUCOSE: 84 mg/dL (ref 70–99)
POTASSIUM: 4.3 meq/L (ref 3.5–5.1)
SODIUM: 137 meq/L (ref 135–145)

## 2013-09-12 LAB — CBC WITH DIFFERENTIAL/PLATELET
BASOS PCT: 0.8 % (ref 0.0–3.0)
Basophils Absolute: 0.1 10*3/uL (ref 0.0–0.1)
EOS ABS: 0.1 10*3/uL (ref 0.0–0.7)
Eosinophils Relative: 1.4 % (ref 0.0–5.0)
HCT: 28.7 % — ABNORMAL LOW (ref 39.0–52.0)
HEMOGLOBIN: 9.1 g/dL — AB (ref 13.0–17.0)
LYMPHS ABS: 2.6 10*3/uL (ref 0.7–4.0)
LYMPHS PCT: 27.1 % (ref 12.0–46.0)
MCHC: 31.7 g/dL (ref 30.0–36.0)
MCV: 91.9 fl (ref 78.0–100.0)
Monocytes Absolute: 0.9 10*3/uL (ref 0.1–1.0)
Monocytes Relative: 9.1 % (ref 3.0–12.0)
Neutro Abs: 6 10*3/uL (ref 1.4–7.7)
Neutrophils Relative %: 61.6 % (ref 43.0–77.0)
Platelets: 644 10*3/uL — ABNORMAL HIGH (ref 150.0–400.0)
RBC: 3.12 Mil/uL — AB (ref 4.22–5.81)
RDW: 14.8 % — ABNORMAL HIGH (ref 11.5–14.6)
WBC: 9.7 10*3/uL (ref 4.5–10.5)

## 2013-09-12 LAB — PROTIME-INR
INR: 4.9 ratio — ABNORMAL HIGH (ref 0.8–1.0)
Prothrombin Time: 50.1 s — ABNORMAL HIGH (ref 10.2–12.4)

## 2013-09-12 NOTE — Patient Instructions (Signed)
Blood work today (blood counts and INR) and I will fax results to Nunzio Cory at [phone] Pass by Marion's office to schedule appointment with speech therapy outpatient in [facility] Good to see you today.  Keep appointment with cardiology next Thursday. Return to see me in 3-4 weeks for follow up

## 2013-09-12 NOTE — Assessment & Plan Note (Signed)
S/p myomectomy at [facility]

## 2013-09-12 NOTE — Assessment & Plan Note (Signed)
Chronic, stable. Continue meds.  Just on lasix 20mg  daily.

## 2013-09-12 NOTE — Assessment & Plan Note (Signed)
Unexplained per patient althoguh recently completed c diff treatment.  Will check CBC today to trend and fax report to [facility] clinic cards per request.

## 2013-09-12 NOTE — Assessment & Plan Note (Signed)
Now resolved s/p myomectomy and MAZE

## 2013-09-12 NOTE — Progress Notes (Signed)
BP 114/70  Pulse 76  Temp(Src) 97.3 F (36.3 C) (Oral)  Wt 222 lb 8 oz (100.925 kg)   CC: hosp f/u  Subjective:    Patient ID: Dylan Fletcher, male    DOB: Jun 01, 1961, 53 y.o.   MRN: [id number]  HPI: Dylan Fletcher is a 53 y.o. male presenting on 09/12/2013 for Follow-up   Dylan Fletcher presents today for hospital follow up after hospitalization at [facility] from 3/2-23/2015 for myomectomy and biatrial MAZE procedure for his HOCM with symptomatic atrial fibrillation.  Surgery complicated by severe central mitral regurgitation leading to MVR with St Jude valve a few days later.  Started on chronic anticoagulation with coumadin, due for check today.  Hospitalization also complicated by C diff infection s/p oral vancomycin course.   Planning on establishing with cardiac rehab after returns to see his cardiologist.  Mention of mild CVA with residual aphasia postop, rec outpt speech therapy which we will set him up with today.   Coumadin w/ goal INR 2.5-3.5 - coumadin started late in hospital stay, currently taking coumadin 7.5mg  daily.  May be interested in home monitoring system.  Unexplained leukocytosis needs CBC f/u today.   Denies chest pain, tightness, dyspnea, minimal cough.  No more afib episodes since discharge.  Relevant past medical, surgical, family and social history reviewed and updated as indicated.  Allergies and medications reviewed and updated. Current Outpatient Prescriptions on File Prior to Visit  Medication Sig  . amoxicillin (AMOXIL) 500 MG tablet Take 500 mg by mouth as directed. 1 BID; start 24 hours prior to dental procedure and 24 hours post dental procedure  . amphetamine-dextroamphetamine (ADDERALL) 30 MG tablet Take 30 mg by mouth as directed. 30 mg QAM; 30 mg lunchtime; 15 mg late afternoon  . cholecalciferol (VITAMIN D) 1000 UNITS tablet Take 1,000 Units by mouth daily.  . L-Glutamine POWD Take 5 g by mouth daily.  Marland Kitchen oxyCODONE-acetaminophen  (PERCOCET/ROXICET) 5-325 MG per tablet Take 1 tablet by mouth every 4 (four) hours as needed for severe pain.  . sildenafil (VIAGRA) 100 MG tablet Take 1 tablet (100 mg total) by mouth daily as needed for erectile dysfunction.   No current facility-administered medications on file prior to visit.    Review of Systems Per HPI unless specifically indicated above    Objective:    BP 114/70  Pulse 76  Temp(Src) 97.3 F (36.3 C) (Oral)  Wt 222 lb 8 oz (100.925 kg)  Physical Exam  Nursing note and vitals reviewed. Constitutional: He appears well-developed and well-nourished. No distress.  HENT:  Head: Normocephalic and atraumatic.  Mouth/Throat: Oropharynx is clear and moist. No oropharyngeal exudate.  Eyes: Conjunctivae and EOM are normal. Pupils are equal, round, and reactive to light.  Cardiovascular: Normal rate, regular rhythm and intact distal pulses.   Murmur (4/6 SEM at apex, mechanical click) heard. Pulmonary/Chest: Effort normal and breath sounds normal. No respiratory distress. He has no wheezes. He has no rales.  Musculoskeletal: He exhibits edema (1+ pedal).  Skin: Skin is warm and dry. There is pallor.  Psychiatric: He has a normal mood and affect.       Assessment & Plan:   Problem List Items Addressed This Visit   Aphasia due to recent cerebral infarction     Refer to speech therapy in [facility] per pt preference    Relevant Orders      Ambulatory referral to Speech Therapy   CVA (cerebral vascular accident)     Post cardiothoracic surgery  with mild residual aphasia (08/2013).  Today no gross dysarthria noted.  Will continue to monitor, refer to outpatient ST. Will scan MRI report On aspirin and coumadin. I don't think he currently needs statin as recent catheterization was WNL according to patient, but will ask pt to get cardiology opinion.    Relevant Medications      warfarin (COUMADIN) 7.5 MG tablet      furosemide (LASIX) 20 MG tablet      aspirin 81  MG tablet   Other Relevant Orders      Ambulatory referral to Speech Therapy   H/O mitral valve replacement   History of atrial fibrillation     Now resolved s/p myomectomy and MAZE    HLD (hyperlipidemia)     Will defer statin therapy to cards.    HTN (hypertension)     Chronic, stable. Continue meds.  Just on lasix 20mg  daily.    Relevant Orders      Basic metabolic panel   Hypertrophic obstructive cardiomyopathy(425.11)     S/p myomectomy at [facility]    Leukocytosis, unspecified     Unexplained per patient althoguh recently completed c diff treatment.  Will check CBC today to trend and fax report to [facility] clinic cards per request.      Warfarin anticoagulation - Primary     Will check INR today and then call pt with plan.  Currently on coumadin 7.5mg  daily.  rec return Monday to establish with our coumadin clinic.    Relevant Orders      CBC with Differential      Protime-INR       Follow up plan: Return in about 4 weeks (around 10/10/2013), or as needed, for follow up visit (but return Monday to establish with Coumadin clinic).

## 2013-09-12 NOTE — Progress Notes (Signed)
Pre visit review using our clinic review tool, if applicable. No additional management support is needed unless otherwise documented below in the visit note. 

## 2013-09-12 NOTE — Assessment & Plan Note (Signed)
Will defer statin therapy to cards.

## 2013-09-12 NOTE — Assessment & Plan Note (Addendum)
Post cardiothoracic surgery with mild residual aphasia (08/2013).  Today no gross dysarthria noted.  Will continue to monitor, refer to outpatient ST. Will scan MRI report On aspirin and coumadin. I don't think he currently needs statin as recent catheterization was WNL according to patient, but will ask pt to get cardiology opinion.

## 2013-09-12 NOTE — Assessment & Plan Note (Signed)
Will check INR today and then call pt with plan.  Currently on coumadin 7.5mg  daily.  rec return Monday to establish with our coumadin clinic.

## 2013-09-12 NOTE — Assessment & Plan Note (Signed)
Refer to speech therapy in [place] per pt preference

## 2013-09-13 ENCOUNTER — Telehealth: Payer: Self-pay | Admitting: Family Medicine

## 2013-09-13 NOTE — Telephone Encounter (Signed)
Relevant patient education assigned to patient using Emmi. ° °

## 2013-09-18 ENCOUNTER — Encounter: Payer: Self-pay | Admitting: Family Medicine

## 2013-09-18 ENCOUNTER — Ambulatory Visit: Payer: PRIVATE HEALTH INSURANCE

## 2013-09-18 ENCOUNTER — Ambulatory Visit (INDEPENDENT_AMBULATORY_CARE_PROVIDER_SITE_OTHER): Payer: PRIVATE HEALTH INSURANCE | Admitting: Family Medicine

## 2013-09-18 DIAGNOSIS — I440 Atrioventricular block, first degree: Secondary | ICD-10-CM | POA: Insufficient documentation

## 2013-09-18 DIAGNOSIS — Z5181 Encounter for therapeutic drug level monitoring: Secondary | ICD-10-CM

## 2013-09-18 LAB — POCT INR: INR: 1.9

## 2013-09-20 DIAGNOSIS — Z7901 Long term (current) use of anticoagulants: Secondary | ICD-10-CM | POA: Insufficient documentation

## 2013-09-20 DIAGNOSIS — Z9889 Other specified postprocedural states: Secondary | ICD-10-CM | POA: Insufficient documentation

## 2013-09-20 DIAGNOSIS — Z8679 Personal history of other diseases of the circulatory system: Secondary | ICD-10-CM | POA: Insufficient documentation

## 2013-09-20 DIAGNOSIS — I639 Cerebral infarction, unspecified: Secondary | ICD-10-CM | POA: Insufficient documentation

## 2013-09-28 ENCOUNTER — Encounter: Payer: Self-pay | Admitting: Family Medicine

## 2013-09-28 ENCOUNTER — Other Ambulatory Visit: Payer: Self-pay | Admitting: Family Medicine

## 2013-10-03 ENCOUNTER — Ambulatory Visit (INDEPENDENT_AMBULATORY_CARE_PROVIDER_SITE_OTHER)
Admission: RE | Admit: 2013-10-03 | Discharge: 2013-10-03 | Disposition: A | Discharge status: Home / Self Care | Payer: PRIVATE HEALTH INSURANCE | Source: Ambulatory Visit | Attending: Internal Medicine | Admitting: Internal Medicine

## 2013-10-03 ENCOUNTER — Ambulatory Visit (INDEPENDENT_AMBULATORY_CARE_PROVIDER_SITE_OTHER): Payer: PRIVATE HEALTH INSURANCE | Admitting: Internal Medicine

## 2013-10-03 ENCOUNTER — Encounter: Payer: Self-pay | Admitting: Internal Medicine

## 2013-10-03 VITALS — BP 118/62 | HR 93 | Temp 99.6°F | Wt 215.0 lb

## 2013-10-03 DIAGNOSIS — I421 Obstructive hypertrophic cardiomyopathy: Secondary | ICD-10-CM

## 2013-10-03 DIAGNOSIS — R0602 Shortness of breath: Secondary | ICD-10-CM

## 2013-10-03 DIAGNOSIS — R05 Cough: Secondary | ICD-10-CM

## 2013-10-03 DIAGNOSIS — R059 Cough, unspecified: Secondary | ICD-10-CM

## 2013-10-03 LAB — COMPREHENSIVE METABOLIC PANEL
ALT: 16 U/L (ref 0–53)
AST: 18 U/L (ref 0–37)
Albumin: 3.4 g/dL — ABNORMAL LOW (ref 3.5–5.2)
Alkaline Phosphatase: 65 U/L (ref 39–117)
BUN: 14 mg/dL (ref 6–23)
CALCIUM: 9.2 mg/dL (ref 8.4–10.5)
CHLORIDE: 105 meq/L (ref 96–112)
CO2: 26 meq/L (ref 19–32)
CREATININE: 0.9 mg/dL (ref 0.4–1.5)
GFR: 90.9 mL/min (ref >60.00)
Glucose, Bld: 120 mg/dL — ABNORMAL HIGH (ref 70–99)
Potassium: 4.1 mEq/L (ref 3.5–5.1)
SODIUM: 138 meq/L (ref 135–145)
TOTAL PROTEIN: 7.7 g/dL (ref 6.0–8.3)
Total Bilirubin: 0.8 mg/dL (ref 0.3–1.2)

## 2013-10-03 LAB — D-DIMER, QUANTITATIVE: D-Dimer, Quant: 3.92 ug/mL-FEU — ABNORMAL HIGH (ref 0.00–0.48)

## 2013-10-03 LAB — CBC
HCT: 34.8 % — ABNORMAL LOW (ref 39.0–52.0)
HEMOGLOBIN: 11.2 g/dL — AB (ref 13.0–17.0)
MCHC: 32.1 g/dL (ref 30.0–36.0)
MCV: 86.4 fl (ref 78.0–100.0)
Platelets: 457 10*3/uL — ABNORMAL HIGH (ref 150.0–400.0)
RBC: 4.03 Mil/uL — ABNORMAL LOW (ref 4.22–5.81)
RDW: 15.4 % — AB (ref 11.5–14.6)
WBC: 14 10*3/uL — ABNORMAL HIGH (ref 4.5–10.5)

## 2013-10-03 NOTE — Progress Notes (Signed)
HPI  Pt presents to the clinic today with c/o cough. He reports this started 2 days ago. The cough is mostly non productive but the mucous is clear at times. He has had some associated shortness of breath. He denies fever, chills, body aches, nasal congestion or allergy symptoms. He has not tried anything OTC. He has not had sick contacts that he is aware of. He does have a history of allergies and asthma.  Of note, he has had a recent complicated hospital admission 08/2012 for myomectomy for HOCM, s/p severe mitral regurg with subsequent mitral valve repair using St. Jude valve. He then developed C diff colitis and was treated with oral vanc. Hospital follow up note reviewed.  Review of Systems      Past Medical History  Diagnosis Date  . History of asthma     as child  . Anxiety associated with depression     sees psych in [place], [place] (meds from there)  . Allergic rhinitis     Ragweed, mold, mildew  . Arrhythmia   . HTN (hypertension)   . HLD (hyperlipidemia)   . Migraine with aura   . Colon polyp 04/2009    rectal tubular adenoma, rec rpt 3 yrs  . OSA (obstructive sleep apnea)     CPAP at night, up to 16 mmHg  . Hypertrophic obstructive cardiomyopathy(425.11)     Cards (Dr. Evelina Bucy) in [place], [place] [phone]) - need abx prophylaxis - now established with [facility] Dr. Rosetta Posner [phone]) and Dr. Shearon Stalls and Octavia Heir s/p myomectomy, now cardiac rehab [facility] 09/2013  . Vitamin D deficiency   . ADD (attention deficit disorder)     sees psych in [place], [place] (meds from there)  . Obesity   . ED (erectile dysfunction)   . Paroxysmal atrial fibrillation 04/2013, 05/2013    with RVR; s/p multiple hospitalizations, spontaneous conversion, referred to Dr. Manuella Ghazi EP, then recurrence - failed sotalol, now trial of multaq (both with spont conversions)  . Claustrophobia   . Warfarin anticoagulation     goal INR 2.5-3.5  . H/O mitral valve replacement 08/2013    St Jude   . CVA (cerebral infarction) 08/2013    post CT surgery - L thalamic lacunar infarct    Family History  Problem Relation Age of Onset  . Alcohol abuse Brother   . Cancer Mother 94    breast  . Psoriasis Father   . Ulcers Mother   . Heart disease Mother     HOCM  . Cancer Maternal Grandfather     colon  . Coronary artery disease Paternal Grandfather 8    MI  . Diabetes Neg Hx   . Stroke Neg Hx     History   Social History  . Marital Status: Married    Spouse Name: N/A    Number of Children: N/A  . Years of Education: N/A   Occupational History  . Not on file.   Social History Main Topics  . Smoking status: Never Smoker   . Smokeless tobacco: Never Used  . Alcohol Use: Yes     Comment: 1-2 drinks/weekly  . Drug Use: No  . Sexual Activity: Not on file   Other Topics Concern  . Not on file   Social History Narrative   Caffeine: quart of soda/day   Lives with wife, 3 cats   Occupation: Nurse, children's   Edu: Bachelor's degree   Activity: golf, walking, mowing lawn   Diet: good  water, fruits/vegetables daily    Allergies  Allergen Reactions  . Calcium Channel Blockers Other (See Comments)    Shortness of breath     Constitutional: Denies headache, fatigue, fever or abrupt weight changes.  HEENT: Denies eye redness, eye pain, pressure behind the eyes, facial pain, nasal congestion, ear pain, ringing in the ears, wax buildup, runny nose or bloody nose. Respiratory: Positive cough and shortness of breath. Denies difficulty breathing.  Cardiovascular: Denies chest pain, chest tightness, palpitations or swelling in the hands or feet.   No other specific complaints in a complete review of systems (except as listed in HPI above).  Objective:   BP 118/62  Pulse 93  Temp(Src) 99.6 F (37.6 C) (Tympanic)  Wt 215 lb (97.523 kg)  SpO2 96% Wt Readings from Last 3 Encounters:  10/03/13 215 lb (97.523 kg)  09/12/13 222 lb 8 oz (100.925 kg)  07/15/13 236 lb 8 oz  (107.276 kg)     General: Appears his stated age, well developed, well nourished in NAD. Cardiovascular: Normal rate and rhythm. S1,S2 noted.  No murmur, rubs or gallops noted. Click noted. No JVD or BLE edema. No carotid bruits noted. Pulmonary/Chest: Normal effort and fine crackles noted in the bases. No respiratory distress. No wheezes or ronchi noted.      Assessment & Plan:   Cough and shortness of breath:  Will check chest xray, cbc, cmet and d dimer Will treat according to findings If worse before I get the results back, go to the ER immediately  RTC as needed or if symptoms persist.

## 2013-10-03 NOTE — Patient Instructions (Signed)

## 2013-10-03 NOTE — Progress Notes (Signed)
Pre visit review using our clinic review tool, if applicable. No additional management support is needed unless otherwise documented below in the visit note. 

## 2013-10-04 DIAGNOSIS — J90 Pleural effusion, not elsewhere classified: Secondary | ICD-10-CM | POA: Insufficient documentation

## 2013-10-10 ENCOUNTER — Ambulatory Visit: Payer: PRIVATE HEALTH INSURANCE

## 2013-10-13 ENCOUNTER — Ambulatory Visit: Payer: PRIVATE HEALTH INSURANCE | Admitting: Family Medicine

## 2013-10-17 ENCOUNTER — Encounter: Payer: Self-pay | Admitting: Family Medicine

## 2013-10-18 ENCOUNTER — Encounter: Payer: Self-pay | Admitting: Family Medicine

## 2013-10-20 ENCOUNTER — Telehealth: Payer: Self-pay | Admitting: Family Medicine

## 2013-10-20 NOTE — Telephone Encounter (Signed)
Left message for pt to return call to schedule hospital f/u appt.

## 2013-10-24 ENCOUNTER — Ambulatory Visit (INDEPENDENT_AMBULATORY_CARE_PROVIDER_SITE_OTHER): Payer: PRIVATE HEALTH INSURANCE | Admitting: Family Medicine

## 2013-10-24 ENCOUNTER — Encounter: Payer: Self-pay | Admitting: Family Medicine

## 2013-10-24 VITALS — BP 126/72 | HR 80 | Temp 97.9°F | Wt 205.5 lb

## 2013-10-24 DIAGNOSIS — I69920 Aphasia following unspecified cerebrovascular disease: Secondary | ICD-10-CM

## 2013-10-24 DIAGNOSIS — I421 Obstructive hypertrophic cardiomyopathy: Secondary | ICD-10-CM

## 2013-10-24 DIAGNOSIS — I69320 Aphasia following cerebral infarction: Secondary | ICD-10-CM

## 2013-10-24 DIAGNOSIS — I10 Essential (primary) hypertension: Secondary | ICD-10-CM

## 2013-10-24 DIAGNOSIS — D649 Anemia, unspecified: Secondary | ICD-10-CM | POA: Insufficient documentation

## 2013-10-24 NOTE — Assessment & Plan Note (Signed)
Stable. S/p myomectomy and MAZE procedure. Recent complicated hospitalization for pleural effusions s/p thoracentesis of 2.4L fluid.  Did complete doxy course for possible PNA. Slowly recovering.  Has f/u scheduled with cards and pulm.

## 2013-10-24 NOTE — Assessment & Plan Note (Signed)
Presumed ABLA from recent procedures - will recommend 1 more month of iron OTC and then recheck CBC next visit.

## 2013-10-24 NOTE — Patient Instructions (Signed)
Good to see you today.  You are doing well.  Call us with questions. Return to see me after you next see Dr. Elba Barman or sooner if needed. Restart OTC iron (65 FE) once a day for the next month and we will recheck blood work next visit.

## 2013-10-24 NOTE — Assessment & Plan Note (Signed)
Chronic, stable. Continue meds.  Lasix and toprol XL

## 2013-10-24 NOTE — Progress Notes (Signed)
Pre visit review using our clinic review tool, if applicable. No additional management support is needed unless otherwise documented below in the visit note. 

## 2013-10-24 NOTE — Assessment & Plan Note (Addendum)
Continue working with SLP Has established with [employer]. Pending f/u neuropsychological testing s/p CVA to help guide return to work.

## 2013-10-24 NOTE — Progress Notes (Signed)
BP 126/72  Pulse 80  Temp(Src) 97.9 F (36.6 C) (Oral)  Wt 205 lb 8 oz (93.214 kg)   CC: hosp f/u  Subjective:    Patient ID: Dylan Fletcher, male    DOB: 20-Jun-1960, 53 y.o.   MRN: [id number]  HPI: Dylan Fletcher is a 53 y.o. male presenting on 10/24/2013 for Follow-up   See prior note for details.  Briefly, pleasant 53 yo s/p recent myomectomy and biatrial MAZE procedure for his HOCM with symptomatic atrial fibrillation at [facility]. Surgery complicated by severe central mitral regurgitation leading to MVR with St Jude valve a few days later. On chronic anticoagulation with coumadin.  Hospitalization also complicated by C diff infection s/p oral vancomycin course and mild CVA with residual aphasia postop.    Seen here last month with cough and found to have pleural effusion, referred to [facility] where CT showed small bilat pleural effusions s/p L sided thoracentesis of 2.4L.  Treated for PNA as well with doxycycline course.  Has set up with speech therapist which is helping.  Wants to have neuropsychological testing done.  Scheduled for June 22nd.  Seeing [place] group.  Able to do his job at home - will see if this is cleared.  Gets fatigued after 2-3 hours of mental work.  Sees Dr. Elba Barman at [facility] and vascular of [facility] regularly.  To start cardiac rehab at [facility].  Followed by coumadin clinic there.  Saw Dr. Juel Burrow NP yesterday as well (pulm) states lungs ok.  IMPRESSION: Small bilateral pleural effusions present. Parenchymal infiltrate in the left lower lobe most consistent with pneumonia. Minimal left basilar atelectasis present. Mild ectasia of the ascending thoracic aorta.   Relevant past medical, surgical, family and social history reviewed and updated as indicated.  Allergies and medications reviewed and updated. Current Outpatient Prescriptions on File Prior to Visit  Medication Sig  . acetaminophen (TYLENOL) 325 MG tablet Take 650 mg by mouth as needed.  Marland Kitchen  amoxicillin (AMOXIL) 500 MG tablet Take 500 mg by mouth as directed. 1 BID; start 24 hours prior to dental procedure and 24 hours post dental procedure  . amphetamine-dextroamphetamine (ADDERALL) 30 MG tablet Take 30 mg by mouth as directed. 30 mg QAM; 30 mg lunchtime; 15 mg late afternoon  . cholecalciferol (VITAMIN D) 1000 UNITS tablet Take 1,000 Units by mouth daily.  . Lactobacillus-Inulin (CULTURELLE DIGESTIVE HEALTH PO) Take 1 tablet by mouth daily.  . Multiple Vitamin (MULTIVITAMIN) tablet Take 1 tablet by mouth daily.  Marland Kitchen oxyCODONE-acetaminophen (PERCOCET/ROXICET) 5-325 MG per tablet Take 1 tablet by mouth every 4 (four) hours as needed for severe pain.  . pantoprazole (PROTONIX) 20 MG tablet Take 20 mg by mouth daily.  . sildenafil (VIAGRA) 100 MG tablet Take 1 tablet (100 mg total) by mouth daily as needed for erectile dysfunction.  Marland Kitchen warfarin (COUMADIN) 5 MG tablet Take 5 mg by mouth as directed. M,W,F   No current facility-administered medications on file prior to visit.    Review of Systems Per HPI unless specifically indicated above    Objective:    BP 126/72  Pulse 80  Temp(Src) 97.9 F (36.6 C) (Oral)  Wt 205 lb 8 oz (93.214 kg)  Physical Exam  Nursing note and vitals reviewed. Constitutional: He appears well-developed and well-nourished. No distress.  HENT:  Mouth/Throat: Oropharynx is clear and moist. No oropharyngeal exudate.  Cardiovascular: Normal rate, regular rhythm and intact distal pulses.   Murmur (mechanical heart sounds) heard. Pulmonary/Chest: Effort  normal and breath sounds normal. No respiratory distress. He has no wheezes. He has no rales.  Musculoskeletal: He exhibits no edema.   Results for orders placed in visit on 10/03/13  CBC      Result Value Ref Range   WBC 14.0 (*) 4.5 - 10.5 K/uL   RBC 4.03 (*) 4.22 - 5.81 Mil/uL   Platelets 457.0 (*) 150.0 - 400.0 K/uL   Hemoglobin 11.2 (*) 13.0 - 17.0 g/dL   HCT 34.8 (*) 39.0 - 52.0 %   MCV 86.4   78.0 - 100.0 fl   MCHC 32.1  30.0 - 36.0 g/dL   RDW 15.4 (*) 11.5 - 14.6 %  COMPREHENSIVE METABOLIC PANEL      Result Value Ref Range   Sodium 138  135 - 145 mEq/L   Potassium 4.1  3.5 - 5.1 mEq/L   Chloride 105  96 - 112 mEq/L   CO2 26  19 - 32 mEq/L   Glucose, Bld 120 (*) 70 - 99 mg/dL   BUN 14  6 - 23 mg/dL   Creatinine, Ser 0.9  0.4 - 1.5 mg/dL   Total Bilirubin 0.8  0.3 - 1.2 mg/dL   Alkaline Phosphatase 65  39 - 117 U/L   AST 18  0 - 37 U/L   ALT 16  0 - 53 U/L   Total Protein 7.7  6.0 - 8.3 g/dL   Albumin 3.4 (*) 3.5 - 5.2 g/dL   Calcium 9.2  8.4 - 10.5 mg/dL   GFR 90.90  >60.00 mL/min  D-DIMER, QUANTITATIVE      Result Value Ref Range   D-Dimer, Quant 3.92 (*) 0.00 - 0.48 ug/mL-FEU      Assessment & Plan:   Problem List Items Addressed This Visit   Hypertrophic obstructive cardiomyopathy(425.11)     Stable. S/p myomectomy and MAZE procedure. Recent complicated hospitalization for pleural effusions s/p thoracentesis of 2.4L fluid.  Did complete doxy course for possible PNA. Slowly recovering.  Has f/u scheduled with cards and pulm.    Relevant Medications      furosemide (LASIX) 20 MG tablet      warfarin (COUMADIN) 7.5 MG tablet      aspirin EC 81 MG tablet      metoprolol succinate (TOPROL-XL) 25 MG 24 hr tablet   HTN (hypertension)     Chronic, stable. Continue meds.  Lasix and toprol XL    Relevant Medications      furosemide (LASIX) 20 MG tablet      warfarin (COUMADIN) 7.5 MG tablet      aspirin EC 81 MG tablet      metoprolol succinate (TOPROL-XL) 25 MG 24 hr tablet   Aphasia due to recent cerebral infarction - Primary     Continue working with SLP Has established with [employer]. Pending f/u neuropsychological testing s/p CVA to help guide return to work.    Anemia     Presumed ABLA from recent procedures - will recommend 1 more month of iron OTC and then recheck CBC next visit.        Follow up plan: Return in about 7 weeks  (around 12/09/2013), or as needed, for follow up.

## 2013-10-25 ENCOUNTER — Encounter: Payer: Self-pay | Admitting: Family Medicine

## 2013-10-29 ENCOUNTER — Encounter: Payer: Self-pay | Admitting: Cardiology

## 2013-11-17 ENCOUNTER — Encounter: Payer: Self-pay | Admitting: Family Medicine

## 2013-11-17 ENCOUNTER — Encounter: Payer: Self-pay | Admitting: Cardiology

## 2013-12-09 ENCOUNTER — Encounter: Payer: Self-pay | Admitting: Family Medicine

## 2013-12-09 ENCOUNTER — Ambulatory Visit (INDEPENDENT_AMBULATORY_CARE_PROVIDER_SITE_OTHER): Payer: PRIVATE HEALTH INSURANCE | Admitting: Family Medicine

## 2013-12-09 VITALS — BP 110/76 | HR 84 | Temp 97.6°F | Wt 206.0 lb

## 2013-12-09 DIAGNOSIS — D649 Anemia, unspecified: Secondary | ICD-10-CM

## 2013-12-09 DIAGNOSIS — I69920 Aphasia following unspecified cerebrovascular disease: Secondary | ICD-10-CM

## 2013-12-09 DIAGNOSIS — I10 Essential (primary) hypertension: Secondary | ICD-10-CM

## 2013-12-09 DIAGNOSIS — I69320 Aphasia following cerebral infarction: Secondary | ICD-10-CM

## 2013-12-09 DIAGNOSIS — I421 Obstructive hypertrophic cardiomyopathy: Secondary | ICD-10-CM

## 2013-12-09 LAB — BASIC METABOLIC PANEL
BUN: 14 mg/dL (ref 6–23)
CALCIUM: 9.7 mg/dL (ref 8.4–10.5)
CO2: 31 meq/L (ref 19–32)
Chloride: 101 mEq/L (ref 96–112)
Creatinine, Ser: 1.1 mg/dL (ref 0.4–1.5)
GFR: 72.39 mL/min (ref >60.00)
GLUCOSE: 101 mg/dL — AB (ref 70–99)
POTASSIUM: 4.1 meq/L (ref 3.5–5.1)
SODIUM: 138 meq/L (ref 135–145)

## 2013-12-09 LAB — CBC WITH DIFFERENTIAL/PLATELET
BASOS ABS: 0.1 10*3/uL (ref 0.0–0.1)
BASOS PCT: 0.6 % (ref 0.0–3.0)
Eosinophils Absolute: 0.1 10*3/uL (ref 0.0–0.7)
Eosinophils Relative: 1.2 % (ref 0.0–5.0)
HEMATOCRIT: 43.8 % (ref 39.0–52.0)
HEMOGLOBIN: 14.3 g/dL (ref 13.0–17.0)
LYMPHS ABS: 2.5 10*3/uL (ref 0.7–4.0)
LYMPHS PCT: 28.9 % (ref 12.0–46.0)
MCHC: 32.6 g/dL (ref 30.0–36.0)
MCV: 82.9 fl (ref 78.0–100.0)
Monocytes Absolute: 0.6 10*3/uL (ref 0.1–1.0)
Monocytes Relative: 7.4 % (ref 3.0–12.0)
NEUTROS ABS: 5.4 10*3/uL (ref 1.4–7.7)
Neutrophils Relative %: 61.9 % (ref 43.0–77.0)
Platelets: 281 10*3/uL (ref 150.0–400.0)
RBC: 5.28 Mil/uL (ref 4.22–5.81)
RDW: 19.4 % — AB (ref 11.5–15.5)
WBC: 8.8 10*3/uL (ref 4.0–10.5)

## 2013-12-09 LAB — IBC PANEL
Iron: 81 ug/dL (ref 42–165)
SATURATION RATIOS: 20.9 % (ref 20.0–50.0)
Transferrin: 276.9 mg/dL (ref 212.0–360.0)

## 2013-12-09 LAB — FERRITIN: Ferritin: 24 ng/mL (ref 22.0–322.0)

## 2013-12-09 NOTE — Assessment & Plan Note (Signed)
Check iron panel and CBC today. That will determine need for continued oral iron intake.

## 2013-12-09 NOTE — Assessment & Plan Note (Signed)
S/p MAZE with MR repair. Discussed anticipated recovery course. Reassured.

## 2013-12-09 NOTE — Progress Notes (Signed)
Pre visit review using our clinic review tool, if applicable. No additional management support is needed unless otherwise documented below in the visit note. 

## 2013-12-09 NOTE — Patient Instructions (Addendum)
Good to see you today, call us with questions. Blood work today. If we see 3lb increase in weight over 3 days or 2 lbs in 1 day, increase lasix to twice daily for 1-2 days. If persistent weight gain despite this, call me. Return to see me in 3 months for follow up.

## 2013-12-09 NOTE — Assessment & Plan Note (Signed)
Chronic, stable. Continue regimen. 

## 2013-12-09 NOTE — Progress Notes (Signed)
BP 110/76  Pulse 84  Temp(Src) 97.6 F (36.4 C) (Oral)  Wt 206 lb (93.441 kg)   CC: 6 wk f/u  Subjective:    Patient ID: Dylan Fletcher, male    DOB: 02-07-1961, 53 y.o.   MRN: [id number]  HPI: Dylan Fletcher is a 53 y.o. male presenting on 12/09/2013 for Follow-up   See prior notes for details. Briefly, pleasant 53 yo s/p recent myomectomy and biatrial MAZE procedure for his HOCM with symptomatic atrial fibrillation at [facility]. Surgery complicated by severe central mitral regurgitation leading to MVR with St Jude valve a few days later. On chronic anticoagulation with coumadin. Hospitalization also complicated by C diff infection s/p oral vancomycin course and mild CVA with residual aphasia postop.   Continue seeing [place].  Staying frustrated because he stays fatigued and notices continued trouble with aphasia. Wants to feel better quicker. Now going back to work - restarted 3 wks ago, 6 hours a day, 5 days a week. Works out of house. Planning on starting driving to [place] some.  Saw neuropsych last month - expected 98-99% recovery  Wt Readings from Last 3 Encounters:  12/09/13 206 lb (93.441 kg)  10/24/13 205 lb 8 oz (93.214 kg)  10/03/13 215 lb (97.523 kg)  Body mass index is 33.27 kg/(m^2).   Relevant past medical, surgical, family and social history reviewed and updated as indicated.  Allergies and medications reviewed and updated. Current Outpatient Prescriptions on File Prior to Visit  Medication Sig  . acetaminophen (TYLENOL) 325 MG tablet Take 650 mg by mouth as needed.  Marland Kitchen amoxicillin (AMOXIL) 500 MG tablet Take 500 mg by mouth as directed. 1 BID; start 24 hours prior to dental procedure and 24 hours post dental procedure  . amphetamine-dextroamphetamine (ADDERALL) 30 MG tablet Take 30 mg by mouth as directed. 30 mg QAM; 30 mg lunchtime; 15 mg late afternoon  . buPROPion (WELLBUTRIN XL) 150 MG 24 hr tablet Take 150 mg by mouth daily.  .  cholecalciferol (VITAMIN D) 1000 UNITS tablet Take 1,000 Units by mouth daily.  . furosemide (LASIX) 20 MG tablet Take 20 mg by mouth daily.  . metoprolol succinate (TOPROL-XL) 25 MG 24 hr tablet Take 1 tablet (25 mg total) by mouth daily.  . Multiple Vitamin (MULTIVITAMIN) tablet Take 1 tablet by mouth daily.  Marland Kitchen oxyCODONE-acetaminophen (PERCOCET/ROXICET) 5-325 MG per tablet Take 1 tablet by mouth every 4 (four) hours as needed for severe pain.  . sildenafil (VIAGRA) 100 MG tablet Take 1 tablet (100 mg total) by mouth daily as needed for erectile dysfunction.  Marland Kitchen warfarin (COUMADIN) 5 MG tablet Take 5 mg by mouth as directed. Tu;Th; Sat; Sun  . warfarin (COUMADIN) 7.5 MG tablet Take 7.5 mg by mouth as directed. M,W,F   No current facility-administered medications on file prior to visit.    Review of Systems Per HPI unless specifically indicated above    Objective:    BP 110/76  Pulse 84  Temp(Src) 97.6 F (36.4 C) (Oral)  Wt 206 lb (93.441 kg)  Physical Exam  Nursing note and vitals reviewed. Constitutional: He appears well-developed and well-nourished. No distress.  HENT:  Mouth/Throat: Oropharynx is clear and moist. No oropharyngeal exudate.  Cardiovascular: Normal rate, regular rhythm and intact distal pulses.   Murmur (mechanical heart sound) heard. Pulmonary/Chest: Effort normal and breath sounds normal. No respiratory distress. He has no wheezes. He has no rales.  Musculoskeletal: He exhibits no edema.  Assessment & Plan:   Problem List Items Addressed This Visit   Hypertrophic obstructive cardiomyopathy(425.11)     S/p MAZE with MR repair. Discussed anticipated recovery course. Reassured.    HTN (hypertension)     Chronic, stable. Continue regimen.    Relevant Orders      Basic metabolic panel   Aphasia due to recent cerebral infarction     Continue working with SLP (and [facility] aphasia project) S/p neuropsych testing.    Anemia - Primary     Check iron  panel and CBC today. That will determine need for continued oral iron intake.    Relevant Medications      ferrous sulfate 324 (65 FE) MG TBEC   Other Relevant Orders      CBC with Differential      IBC panel      Ferritin       Follow up plan: Return in about 3 months (around 03/11/2014), or as needed, for follow up visit.

## 2013-12-09 NOTE — Assessment & Plan Note (Signed)
Continue working with SLP (and triangle aphasia project) S/p neuropsych testing.

## 2013-12-10 ENCOUNTER — Other Ambulatory Visit: Payer: Self-pay | Admitting: Family Medicine

## 2013-12-10 MED ORDER — FERROUS SULFATE 324 (65 FE) MG PO TBEC: 1.0000 | DELAYED_RELEASE_TABLET | ORAL | Status: DC

## 2013-12-15 ENCOUNTER — Emergency Department: Payer: Self-pay | Admitting: Emergency Medicine

## 2013-12-15 LAB — PRO B NATRIURETIC PEPTIDE: B-TYPE NATIURETIC PEPTID: 339 pg/mL — AB (ref 0–125)

## 2013-12-15 LAB — URINALYSIS, COMPLETE
BLOOD: NEGATIVE
Bacteria: NONE SEEN
Bilirubin,UR: NEGATIVE
Glucose,UR: NEGATIVE mg/dL (ref 0–75)
Leukocyte Esterase: NEGATIVE
Nitrite: NEGATIVE
Ph: 6 (ref 4.5–8.0)
Protein: NEGATIVE
RBC,UR: NONE SEEN /HPF (ref 0–5)
SQUAMOUS EPITHELIAL: NONE SEEN
Specific Gravity: 1.023 (ref 1.003–1.030)
WBC UR: 2 /HPF (ref 0–5)

## 2013-12-15 LAB — CBC
HCT: 45.1 % (ref 40.0–52.0)
HGB: 14.3 g/dL (ref 13.0–18.0)
MCH: 26.2 pg (ref 26.0–34.0)
MCHC: 31.6 g/dL — ABNORMAL LOW (ref 32.0–36.0)
MCV: 83 fL (ref 80–100)
Platelet: 302 10*3/uL (ref 150–440)
RBC: 5.45 10*6/uL (ref 4.40–5.90)
RDW: 19.8 % — AB (ref 11.5–14.5)
WBC: 12.1 10*3/uL — ABNORMAL HIGH (ref 3.8–10.6)

## 2013-12-15 LAB — CK TOTAL AND CKMB (NOT AT ARMC)
CK, Total: 61 U/L
CK-MB: 0.5 ng/mL (ref 0.5–3.6)

## 2013-12-15 LAB — PROTIME-INR
INR: 2
Prothrombin Time: 22 secs — ABNORMAL HIGH (ref 11.5–14.7)

## 2013-12-15 LAB — TROPONIN I: Troponin-I: <0.02 ng/mL

## 2013-12-15 LAB — TSH: Thyroid Stimulating Horm: 0.798 u[IU]/mL

## 2013-12-15 LAB — APTT: Activated PTT: 45.4 secs — ABNORMAL HIGH (ref 23.6–35.9)

## 2013-12-15 LAB — D-DIMER(ARMC): D-DIMER: 503 ng/mL

## 2013-12-16 ENCOUNTER — Encounter: Payer: Self-pay | Admitting: Family Medicine

## 2013-12-17 ENCOUNTER — Telehealth: Payer: Self-pay | Admitting: Family Medicine

## 2013-12-17 ENCOUNTER — Encounter: Payer: Self-pay | Admitting: Cardiology

## 2013-12-17 NOTE — Telephone Encounter (Signed)
Pt's wife called and says she has faxed over all the ER d/c notes to you from pt's visit to [facility] ER 12/15/2013. She says pt is still having chills, then he'll get hot and cold again. His temp is below normal. Mrs. Pearletha Furl says pt is miserable and she is concerned about possible infections. She wants to know if you have reviewed the d/c notes and if pt needs to make an apptmt to be seen. Please advise. Thank you.

## 2013-12-17 NOTE — Telephone Encounter (Signed)
Patient's wife confirmed he is taking abx.. No openings today with any provider. Added tomorrow on your schedule. Advised if worsening throughout the night to return to [facility]/ER. Wife verbalized understanding. Records requested.

## 2013-12-17 NOTE — Telephone Encounter (Signed)
Reviewed note she faxed - ensure he is taking azithromcyin prescribed at ER. I would recommend eval for this. I am gone this afternoon but would offer appt today if another provider has availability today. Otherwise could see me tomorrow at 2:30 or 2:45pm. Also can we get [facility] ER records?

## 2013-12-18 ENCOUNTER — Ambulatory Visit (INDEPENDENT_AMBULATORY_CARE_PROVIDER_SITE_OTHER)
Admission: RE | Admit: 2013-12-18 | Discharge: 2013-12-18 | Disposition: A | Discharge status: Home / Self Care | Payer: PRIVATE HEALTH INSURANCE | Source: Ambulatory Visit | Attending: Family Medicine | Admitting: Family Medicine

## 2013-12-18 ENCOUNTER — Encounter: Payer: Self-pay | Admitting: Family Medicine

## 2013-12-18 ENCOUNTER — Ambulatory Visit (INDEPENDENT_AMBULATORY_CARE_PROVIDER_SITE_OTHER): Payer: PRIVATE HEALTH INSURANCE | Admitting: Family Medicine

## 2013-12-18 VITALS — BP 138/86 | HR 79 | Temp 97.8°F | Wt 203.8 lb

## 2013-12-18 DIAGNOSIS — R5381 Other malaise: Secondary | ICD-10-CM

## 2013-12-18 DIAGNOSIS — R5383 Other fatigue: Secondary | ICD-10-CM

## 2013-12-18 DIAGNOSIS — Z7901 Long term (current) use of anticoagulants: Secondary | ICD-10-CM

## 2013-12-18 DIAGNOSIS — R5382 Chronic fatigue, unspecified: Secondary | ICD-10-CM | POA: Insufficient documentation

## 2013-12-18 LAB — COMPREHENSIVE METABOLIC PANEL
ALK PHOS: 59 U/L (ref 39–117)
ALT: 21 U/L (ref 0–53)
AST: 23 U/L (ref 0–37)
Albumin: 4.5 g/dL (ref 3.5–5.2)
BILIRUBIN TOTAL: 0.4 mg/dL (ref 0.2–1.2)
BUN: 16 mg/dL (ref 6–23)
CO2: 31 mEq/L (ref 19–32)
CREATININE: 1.2 mg/dL (ref 0.4–1.5)
Calcium: 9.5 mg/dL (ref 8.4–10.5)
Chloride: 102 mEq/L (ref 96–112)
GFR: 70.21 mL/min (ref >60.00)
GLUCOSE: 100 mg/dL — AB (ref 70–99)
Potassium: 4.5 mEq/L (ref 3.5–5.1)
Sodium: 138 mEq/L (ref 135–145)
Total Protein: 8 g/dL (ref 6.0–8.3)

## 2013-12-18 LAB — POCT INR: INR: 2.2

## 2013-12-18 NOTE — Progress Notes (Signed)
Pre visit review using our clinic review tool, if applicable. No additional management support is needed unless otherwise documented below in the visit note. 

## 2013-12-18 NOTE — Progress Notes (Addendum)
BP 138/86  Pulse 79  Temp(Src) 97.8 F (36.6 C) (Oral)  Wt 203 lb 12.8 oz (92.443 kg)  SpO2 98%   CC: ER f/u  Subjective:    Patient ID: Dylan Fletcher, male    DOB: June 09, 1961, 53 y.o.   MRN: [id number]  HPI: Dylan Fletcher is a 52 y.o. male presenting on 12/18/2013 for Hospitalization Follow-up   See recent phone note. Seen at [facility] ER 12/15/2013 after sweats/weakness/chills developed at [facility] cardiac rehab. Workup unrevealing except for possible mild PNA on CXR and WBC of 12.1 - dx with gen weakness. Treated with zpack. Records reviewed. EKG - NSR with LBBB at rate of 80s CXR - mild patchy retrocardiac opacity - atx vs PNA, second opacity lateral overlying lower thoracic spine - infection vs nodule TSH WNL 0.7 BNP 330s  Since he's been home, has alternated chills and sweats. Staying fatigued/weak. Malaise comes and goes. Appetite down. No cough or congestion, no new rashes, abd pain, joint pains, diarrhea, constipation, nausea/vomiting, dysuria, urgency or frequency. No recent steroid use.   Relevant past medical, surgical, family and social history reviewed and updated as indicated.  Allergies and medications reviewed and updated. Current Outpatient Prescriptions on File Prior to Visit  Medication Sig  . acetaminophen (TYLENOL) 325 MG tablet Take 650 mg by mouth as needed.  Marland Kitchen amphetamine-dextroamphetamine (ADDERALL) 30 MG tablet Take 30 mg by mouth as directed. 30 mg QAM; 30 mg lunchtime; 15 mg late afternoon  . buPROPion (WELLBUTRIN XL) 150 MG 24 hr tablet Take 150 mg by mouth daily.  . cholecalciferol (VITAMIN D) 1000 UNITS tablet Take 1,000 Units by mouth daily.  . ferrous sulfate 324 (65 FE) MG TBEC Take 1 tablet (325 mg total) by mouth every Monday, Wednesday, and Friday.  . furosemide (LASIX) 20 MG tablet Take 20 mg by mouth daily.  . metoprolol succinate (TOPROL-XL) 25 MG 24 hr tablet Take 1 tablet (25 mg total) by mouth daily.  . Multiple Vitamin (MULTIVITAMIN)  tablet Take 1 tablet by mouth daily.  Marland Kitchen oxyCODONE-acetaminophen (PERCOCET/ROXICET) 5-325 MG per tablet Take 1 tablet by mouth every 4 (four) hours as needed for severe pain.  . sildenafil (VIAGRA) 100 MG tablet Take 1 tablet (100 mg total) by mouth daily as needed for erectile dysfunction.  Marland Kitchen warfarin (COUMADIN) 5 MG tablet Take 5 mg by mouth as directed. Tu,Th,Sun  . warfarin (COUMADIN) 7.5 MG tablet Take 7.5 mg by mouth as directed. M,W,F,Sat  . amoxicillin (AMOXIL) 500 MG tablet Take 500 mg by mouth as directed. 1 BID; start 24 hours prior to dental procedure and 24 hours post dental procedure   No current facility-administered medications on file prior to visit.    Review of Systems Per HPI unless specifically indicated above    Objective:    BP 138/86  Pulse 79  Temp(Src) 97.8 F (36.6 C) (Oral)  Wt 203 lb 12.8 oz (92.443 kg)  SpO2 98%  Physical Exam  Nursing note and vitals reviewed. Constitutional: He is oriented to person, place, and time. He appears well-developed and well-nourished. No distress.  HENT:  Mouth/Throat: Oropharynx is clear and moist. No oropharyngeal exudate.  Eyes: Conjunctivae and EOM are normal. Pupils are equal, round, and reactive to light. No scleral icterus.  Neck: Normal range of motion. Neck supple.  Cardiovascular: Normal rate, regular rhythm and intact distal pulses.   Murmur (4/6 SEM at apex, mechanical click) heard. Pulmonary/Chest: Effort normal and breath sounds normal. No respiratory distress. He has no  wheezes. He has no rales.  Abdominal: Soft. Bowel sounds are normal. He exhibits no distension and no mass. There is no tenderness. There is no rebound and no guarding.  Musculoskeletal: He exhibits no edema.  Neurological: He is alert and oriented to person, place, and time. He has normal strength. No cranial nerve deficit or sensory deficit. He displays a negative Romberg sign. Coordination normal.  CN 2-12 intact FTN intact  Skin: Skin is  warm and dry. No rash noted.       Assessment & Plan:   Problem List Items Addressed This Visit   Warfarin anticoagulation     INR check today - 2.2. Will take 10mg  today then change to 5mg  three times a week and 7.5mg  four times a week. F/u with cardiology coumadin clinic next week.    Relevant Orders      POCT INR (Completed)   Malaise - Primary     Complicated cardiac patient  Now with 1 wk h/o temperature instability that started at cardiac rehab - check CMP today (was not checked at ER). ?adrenal insuff.  No evidence of infection currently, finishing zpack for presumed PNA at [facility] ER. Recheck CXR today to f/u abnormalities seen on ER CXR (?small PNA and nodule). Will contact pt with results over weekend.    Relevant Orders      DG Chest 2 View (Completed)      Comprehensive metabolic panel (Completed)       Follow up plan: Return if symptoms worsen or fail to improve.

## 2013-12-18 NOTE — Assessment & Plan Note (Signed)
Complicated cardiac patient  Now with 1 wk h/o temperature instability that started at cardiac rehab - check CMP today (was not checked at ER). ?adrenal insuff.  No evidence of infection currently, finishing zpack for presumed PNA at [facility] ER. Recheck CXR today to f/u abnormalities seen on ER CXR (?small PNA and nodule). Will contact pt with results over weekend.

## 2013-12-18 NOTE — Addendum Note (Signed)
Addended by: Ellamae Sia on: 12/18/2013 03:53 PM   Modules accepted: Orders

## 2013-12-18 NOTE — Addendum Note (Signed)
Addended by: Ria Bush on: 12/18/2013 03:36 PM   Modules accepted: Orders

## 2013-12-18 NOTE — Assessment & Plan Note (Signed)
INR check today - 2.2. Will take 10mg  today then change to 5mg  three times a week and 7.5mg  four times a week. F/u with cardiology coumadin clinic next week.

## 2013-12-18 NOTE — Patient Instructions (Addendum)
Let's get blood work today and xray today. We will contact you with results and plan. If feeling worse over weekend, seek urgent care (fever, or low temperature <97, or more dizzy).  For coumadin 5mg  - take 2 tablets today, then start taking 1 tablet Tu, Th, Sun, and 1.5 tablet M, W, F, Sat.

## 2013-12-19 ENCOUNTER — Other Ambulatory Visit: Payer: Self-pay | Admitting: Family Medicine

## 2013-12-19 ENCOUNTER — Other Ambulatory Visit: Payer: Self-pay | Admitting: Cardiology

## 2013-12-19 ENCOUNTER — Encounter: Payer: Self-pay | Admitting: Family Medicine

## 2013-12-19 LAB — TSH: THYROID STIMULATING HORM: 0.666 u[IU]/mL

## 2013-12-19 MED ORDER — AZITHROMYCIN 250 MG PO TABS: 250.0000 mg | ORAL_TABLET | Freq: Every day | ORAL | Status: DC

## 2013-12-24 LAB — CULTURE, BLOOD (SINGLE)

## 2014-02-05 ENCOUNTER — Ambulatory Visit: Payer: Self-pay | Admitting: Family Medicine

## 2014-02-05 DIAGNOSIS — Z5181 Encounter for therapeutic drug level monitoring: Secondary | ICD-10-CM

## 2014-02-09 ENCOUNTER — Encounter: Payer: Self-pay | Admitting: Family Medicine

## 2014-02-10 ENCOUNTER — Encounter: Payer: PRIVATE HEALTH INSURANCE | Admitting: Family Medicine

## 2014-02-25 ENCOUNTER — Other Ambulatory Visit: Payer: Self-pay | Admitting: Family Medicine

## 2014-02-25 DIAGNOSIS — Z125 Encounter for screening for malignant neoplasm of prostate: Secondary | ICD-10-CM

## 2014-02-25 DIAGNOSIS — E785 Hyperlipidemia, unspecified: Secondary | ICD-10-CM

## 2014-02-25 DIAGNOSIS — D649 Anemia, unspecified: Secondary | ICD-10-CM

## 2014-02-25 DIAGNOSIS — I10 Essential (primary) hypertension: Secondary | ICD-10-CM

## 2014-02-25 DIAGNOSIS — E559 Vitamin D deficiency, unspecified: Secondary | ICD-10-CM

## 2014-02-25 DIAGNOSIS — E669 Obesity, unspecified: Secondary | ICD-10-CM

## 2014-03-04 ENCOUNTER — Other Ambulatory Visit (INDEPENDENT_AMBULATORY_CARE_PROVIDER_SITE_OTHER): Payer: PRIVATE HEALTH INSURANCE

## 2014-03-04 DIAGNOSIS — E785 Hyperlipidemia, unspecified: Secondary | ICD-10-CM

## 2014-03-04 DIAGNOSIS — Z125 Encounter for screening for malignant neoplasm of prostate: Secondary | ICD-10-CM

## 2014-03-04 DIAGNOSIS — I10 Essential (primary) hypertension: Secondary | ICD-10-CM

## 2014-03-04 DIAGNOSIS — E559 Vitamin D deficiency, unspecified: Secondary | ICD-10-CM

## 2014-03-04 DIAGNOSIS — D649 Anemia, unspecified: Secondary | ICD-10-CM

## 2014-03-04 DIAGNOSIS — Z0000 Encounter for general adult medical examination without abnormal findings: Secondary | ICD-10-CM

## 2014-03-04 LAB — COMPREHENSIVE METABOLIC PANEL
ALT: 37 U/L (ref 0–53)
AST: 33 U/L (ref 0–37)
Albumin: 4.3 g/dL (ref 3.5–5.2)
Alkaline Phosphatase: 67 U/L (ref 39–117)
BILIRUBIN TOTAL: 0.9 mg/dL (ref 0.2–1.2)
BUN: 17 mg/dL (ref 6–23)
CALCIUM: 9.3 mg/dL (ref 8.4–10.5)
CHLORIDE: 102 meq/L (ref 96–112)
CO2: 29 meq/L (ref 19–32)
Creatinine, Ser: 1.2 mg/dL (ref 0.4–1.5)
GFR: 69.46 mL/min (ref >60.00)
Glucose, Bld: 123 mg/dL — ABNORMAL HIGH (ref 70–99)
Potassium: 4.1 mEq/L (ref 3.5–5.1)
Sodium: 137 mEq/L (ref 135–145)
TOTAL PROTEIN: 7.3 g/dL (ref 6.0–8.3)

## 2014-03-04 LAB — LIPID PANEL
Cholesterol: 269 mg/dL — ABNORMAL HIGH (ref 0–200)
HDL: 42.8 mg/dL (ref >39.00)
NonHDL: 226.2
TRIGLYCERIDES: 256 mg/dL — AB (ref 0.0–149.0)
Total CHOL/HDL Ratio: 6
VLDL: 51.2 mg/dL — ABNORMAL HIGH (ref 0.0–40.0)

## 2014-03-04 LAB — VITAMIN D 25 HYDROXY (VIT D DEFICIENCY, FRACTURES): VITD: 28.71 ng/mL — ABNORMAL LOW (ref 30.00–100.00)

## 2014-03-04 LAB — PSA: PSA: 0.78 ng/mL (ref 0.10–4.00)

## 2014-03-04 LAB — TSH: TSH: 0.53 u[IU]/mL (ref 0.35–4.50)

## 2014-03-05 LAB — CBC WITH DIFFERENTIAL/PLATELET
BASOS ABS: 0.1 10*3/uL (ref 0.0–0.1)
Basophils Relative: 0.8 % (ref 0.0–3.0)
EOS PCT: 1.8 % (ref 0.0–5.0)
Eosinophils Absolute: 0.2 10*3/uL (ref 0.0–0.7)
HCT: 44.6 % (ref 39.0–52.0)
HEMOGLOBIN: 14.7 g/dL (ref 13.0–17.0)
LYMPHS ABS: 3 10*3/uL (ref 0.7–4.0)
LYMPHS PCT: 28.4 % (ref 12.0–46.0)
MCHC: 32.9 g/dL (ref 30.0–36.0)
MCV: 89.4 fl (ref 78.0–100.0)
MONOS PCT: 6.3 % (ref 3.0–12.0)
Monocytes Absolute: 0.7 10*3/uL (ref 0.1–1.0)
Neutro Abs: 6.6 10*3/uL (ref 1.4–7.7)
Neutrophils Relative %: 62.7 % (ref 43.0–77.0)
PLATELETS: 288 10*3/uL (ref 150.0–400.0)
RBC: 4.99 Mil/uL (ref 4.22–5.81)
RDW: 16.6 % — ABNORMAL HIGH (ref 11.5–15.5)
WBC: 10.5 10*3/uL (ref 4.0–10.5)

## 2014-03-05 LAB — LDL CHOLESTEROL, DIRECT: LDL DIRECT: 202.9 mg/dL

## 2014-03-10 ENCOUNTER — Encounter: Payer: Self-pay | Admitting: Family Medicine

## 2014-03-10 ENCOUNTER — Ambulatory Visit (INDEPENDENT_AMBULATORY_CARE_PROVIDER_SITE_OTHER): Payer: PRIVATE HEALTH INSURANCE | Admitting: Family Medicine

## 2014-03-10 VITALS — BP 114/68 | HR 95 | Temp 98.1°F | Ht 66.0 in | Wt 209.5 lb

## 2014-03-10 DIAGNOSIS — F418 Other specified anxiety disorders: Secondary | ICD-10-CM

## 2014-03-10 DIAGNOSIS — E785 Hyperlipidemia, unspecified: Secondary | ICD-10-CM

## 2014-03-10 DIAGNOSIS — E559 Vitamin D deficiency, unspecified: Secondary | ICD-10-CM

## 2014-03-10 DIAGNOSIS — Z0000 Encounter for general adult medical examination without abnormal findings: Secondary | ICD-10-CM

## 2014-03-10 DIAGNOSIS — F341 Dysthymic disorder: Secondary | ICD-10-CM

## 2014-03-10 DIAGNOSIS — Z86010 Personal history of colonic polyps: Secondary | ICD-10-CM

## 2014-03-10 DIAGNOSIS — I10 Essential (primary) hypertension: Secondary | ICD-10-CM

## 2014-03-10 DIAGNOSIS — I421 Obstructive hypertrophic cardiomyopathy: Secondary | ICD-10-CM

## 2014-03-10 NOTE — Assessment & Plan Note (Signed)
Chronic, stable. Continue toprol xl  

## 2014-03-10 NOTE — Assessment & Plan Note (Signed)
S/p MAZE with MR repair. Overall doing well.

## 2014-03-10 NOTE — Assessment & Plan Note (Signed)
Preventative protocols reviewed and updated unless pt declined. Discussed healthy diet and lifestyle.  

## 2014-03-10 NOTE — Progress Notes (Signed)
Pre visit review using our clinic review tool, if applicable. No additional management support is needed unless otherwise documented below in the visit note. 

## 2014-03-10 NOTE — Progress Notes (Addendum)
BP 114/68  Pulse 95  Temp(Src) 98.1 F (36.7 C) (Oral)  Ht 5\' 6"  (1.676 m)  Wt 209 lb 8 oz (95.029 kg)  BMI 33.83 kg/m2   CC: CPE  Subjective:    Patient ID: Dylan Fletcher, male    DOB: April 01, 1961, 53 y.o.   MRN: [id number]  HPI: Dylan Fletcher is a 53 y.o. male presenting on 03/10/2014 for Annual Exam   Pleasant 53 yo s/p recent myomectomy and biatrial MAZE procedure for his HOCM with symptomatic atrial fibrillation at [facility] 08/2013. Surgery complicated by severe central mitral regurgitation leading to MVR with St Jude valve a few days later. On chronic anticoagulation with coumadin. Hospitalization also complicated by C diff infection s/p oral vancomycin course and mild CVA with residual aphasia postop. Recently had unexplained subjective fevers/chills - s/p unrevealing eval by cardiology. Even saw cardiology in [place].  Preventative: Colon screening - colonoscopy done 04/2009, 1 polyp, rec rpt 3-5 yrs (Dr. Rosemarie Beath [place]). Pt wants to undergo repeat colonoscopy with local gastroenterologist - wants to have lighter prep to avoid dehydration. We had deferred due to above cardiac procedures.  Prostate cancer screening - requests continued screening. Nocturia x1. Slight weakening of stream. Tdap 01/2103 Flu at work.  Caffeine: cutting back - 12-14 oz soda/day  Lives with wife, 3 cats  Occupation: Nurse, children's  Edu: Bachelor's degree  Activity: walking several times a week Diet: good water, fruits/vegetables daily   Relevant past medical, surgical, family and social history reviewed and updated as indicated.  Allergies and medications reviewed and updated. Current Outpatient Prescriptions on File Prior to Visit  Medication Sig  . acetaminophen (TYLENOL) 325 MG tablet Take 650 mg by mouth as needed.  Marland Kitchen amoxicillin (AMOXIL) 500 MG tablet Take 500 mg by mouth as directed. 1 BID; start 24 hours prior to dental procedure and 24 hours post dental procedure  .  amphetamine-dextroamphetamine (ADDERALL) 30 MG tablet Take 30 mg by mouth as directed. 30 mg QAM; 30 mg lunchtime; 15 mg late afternoon  . ferrous sulfate 324 (65 FE) MG TBEC Take 1 tablet (325 mg total) by mouth every Monday, Wednesday, and Friday.  . furosemide (LASIX) 20 MG tablet Take 20 mg by mouth daily.  . metoprolol succinate (TOPROL-XL) 25 MG 24 hr tablet Take 1 tablet (25 mg total) by mouth daily.  . Multiple Vitamin (MULTIVITAMIN) tablet Take 1 tablet by mouth daily.  Marland Kitchen oxyCODONE-acetaminophen (PERCOCET/ROXICET) 5-325 MG per tablet Take 1 tablet by mouth every 4 (four) hours as needed for severe pain.  . sildenafil (VIAGRA) 100 MG tablet Take 1 tablet (100 mg total) by mouth daily as needed for erectile dysfunction.  Marland Kitchen warfarin (COUMADIN) 5 MG tablet Take 5 mg by mouth as directed. Tu,Th,Sun  . warfarin (COUMADIN) 7.5 MG tablet Take 7.5 mg by mouth as directed. M,W,F,Sat   No current facility-administered medications on file prior to visit.    Review of Systems  Constitutional: Negative for fever, chills, activity change, appetite change, fatigue and unexpected weight change.  HENT: Negative for hearing loss.   Eyes: Negative for visual disturbance.  Respiratory: Negative for cough, chest tightness, shortness of breath and wheezing.   Cardiovascular: Negative for chest pain, palpitations and leg swelling.  Gastrointestinal: Negative for nausea, vomiting, abdominal pain, diarrhea, constipation, blood in stool and abdominal distention.  Genitourinary: Negative for hematuria and difficulty urinating.  Musculoskeletal: Negative for arthralgias, myalgias and neck pain.  Skin: Negative for rash.  Neurological: Positive for  dizziness. Negative for seizures, syncope and headaches.  Hematological: Negative for adenopathy. Does not bruise/bleed easily.  Psychiatric/Behavioral: Negative for dysphoric mood. The patient is not nervous/anxious.    Per HPI unless specifically indicated above     Objective:    BP 114/68  Pulse 95  Temp(Src) 98.1 F (36.7 C) (Oral)  Ht 5\' 6"  (1.676 m)  Wt 209 lb 8 oz (95.029 kg)  BMI 33.83 kg/m2  Physical Exam  Nursing note and vitals reviewed. Constitutional: He is oriented to person, place, and time. He appears well-developed and well-nourished. No distress.  HENT:  Head: Normocephalic and atraumatic.  Right Ear: Hearing, tympanic membrane, external ear and ear canal normal.  Left Ear: Hearing, tympanic membrane, external ear and ear canal normal.  Nose: Nose normal.  Mouth/Throat: Uvula is midline, oropharynx is clear and moist and mucous membranes are normal. No oropharyngeal exudate, posterior oropharyngeal edema or posterior oropharyngeal erythema.  Eyes: Conjunctivae and EOM are normal. Pupils are equal, round, and reactive to light. No scleral icterus.  Neck: Normal range of motion. Neck supple. No thyromegaly present.  Cardiovascular: Normal rate, regular rhythm and intact distal pulses.   Murmur (holosystolic best at LUSB, with mechanical click best at apex) heard. Pulses:      Radial pulses are 2+ on the right side, and 2+ on the left side.  Pulmonary/Chest: Effort normal and breath sounds normal. No respiratory distress. He has no wheezes. He has no rales.  Abdominal: Soft. Bowel sounds are normal. He exhibits no distension and no mass. There is no tenderness. There is no rebound and no guarding.  Genitourinary: Rectum normal. Rectal exam shows no external hemorrhoid, no internal hemorrhoid, no fissure, no mass, no tenderness and anal tone normal. Prostate is enlarged (25gm). Prostate is not tender.  Musculoskeletal: Normal range of motion. He exhibits no edema.  Lymphadenopathy:    He has no cervical adenopathy.  Neurological: He is alert and oriented to person, place, and time.  CN grossly intact, station and gait intact  Skin: Skin is warm and dry. No rash noted.  Psychiatric: He has a normal mood and affect. His behavior is  normal. Judgment and thought content normal.   Results for orders placed in visit on 03/04/14  VITAMIN D 25 HYDROXY      Result Value Ref Range   VITD 28.71 (*) 30.00 - 100.00 ng/mL  LIPID PANEL      Result Value Ref Range   Cholesterol 269 (*) 0 - 200 mg/dL   Triglycerides 256.0 (*) 0.0 - 149.0 mg/dL   HDL 42.80  >39.00 mg/dL   VLDL 51.2 (*) 0.0 - 40.0 mg/dL   Total CHOL/HDL Ratio 6     NonHDL 226.20    COMPREHENSIVE METABOLIC PANEL      Result Value Ref Range   Sodium 137  135 - 145 mEq/L   Potassium 4.1  3.5 - 5.1 mEq/L   Chloride 102  96 - 112 mEq/L   CO2 29  19 - 32 mEq/L   Glucose, Bld 123 (*) 70 - 99 mg/dL   BUN 17  6 - 23 mg/dL   Creatinine, Ser 1.2  0.4 - 1.5 mg/dL   Total Bilirubin 0.9  0.2 - 1.2 mg/dL   Alkaline Phosphatase 67  39 - 117 U/L   AST 33  0 - 37 U/L   ALT 37  0 - 53 U/L   Total Protein 7.3  6.0 - 8.3 g/dL   Albumin 4.3  3.5 - 5.2 g/dL   Calcium 9.3  8.4 - 10.5 mg/dL   GFR 69.46  >60.00 mL/min  CBC WITH DIFFERENTIAL      Result Value Ref Range   WBC 10.5  4.0 - 10.5 K/uL   RBC 4.99  4.22 - 5.81 Mil/uL   Hemoglobin 14.7  13.0 - 17.0 g/dL   HCT 44.6  39.0 - 52.0 %   MCV 89.4  78.0 - 100.0 fl   MCHC 32.9  30.0 - 36.0 g/dL   RDW 16.6 (*) 11.5 - 15.5 %   Platelets 288.0  150.0 - 400.0 K/uL   Neutrophils Relative % 62.7  43.0 - 77.0 %   Lymphocytes Relative 28.4  12.0 - 46.0 %   Monocytes Relative 6.3  3.0 - 12.0 %   Eosinophils Relative 1.8  0.0 - 5.0 %   Basophils Relative 0.8  0.0 - 3.0 %   Neutro Abs 6.6  1.4 - 7.7 K/uL   Lymphs Abs 3.0  0.7 - 4.0 K/uL   Monocytes Absolute 0.7  0.1 - 1.0 K/uL   Eosinophils Absolute 0.2  0.0 - 0.7 K/uL   Basophils Absolute 0.1  0.0 - 0.1 K/uL  PSA      Result Value Ref Range   PSA 0.78  0.10 - 4.00 ng/mL  TSH      Result Value Ref Range   TSH 0.53  0.35 - 4.50 uIU/mL  LDL CHOLESTEROL, DIRECT      Result Value Ref Range   Direct LDL 202.9        Assessment & Plan:   Problem List Items Addressed This  Visit   Vitamin D deficiency     Increase to 2000 units daily.    Hypertrophic obstructive cardiomyopathy(425.11)     S/p MAZE with MR repair. Overall doing well.    HTN (hypertension)     Chronic, stable. Continue toprol xl.    HLD (hyperlipidemia)     Not fasting at last FLP - will return fasting to recheck this.    Relevant Orders      Lipid panel   History of adenomatous polyp of colon     H/o rectal tubular adenoma 2010. I will check with local GI re use of virtual colonoscopy in this setting given complicated cardiac history.    Healthcare maintenance - Primary     Preventative protocols reviewed and updated unless pt declined. Discussed healthy diet and lifestyle.    Anxiety associated with depression     Stable on lexapro.        Follow up plan: Return in about 6 months (around 09/08/2014), or as needed, for follow up visit.

## 2014-03-10 NOTE — Assessment & Plan Note (Signed)
Increase to 2000 units daily

## 2014-03-10 NOTE — Assessment & Plan Note (Signed)
Not fasting at last FLP - will return fasting to recheck this.

## 2014-03-10 NOTE — Assessment & Plan Note (Signed)
H/o rectal tubular adenoma 2010. I will check with local GI re use of virtual colonoscopy in this setting given complicated cardiac history.

## 2014-03-10 NOTE — Patient Instructions (Signed)
Flu shot at work - let us know when you receive to update your chart. Return fasting for lab work to recheck cholesterol levels. I will check on colon cancer screening and contact you - but also check with [facility] about virtual colonoscopy. Good to see you today, call us with quesitons. Return as needed or in 6 months for follow up.

## 2014-03-10 NOTE — Assessment & Plan Note (Signed)
Stable on lexapro.   

## 2014-03-12 ENCOUNTER — Other Ambulatory Visit (INDEPENDENT_AMBULATORY_CARE_PROVIDER_SITE_OTHER): Payer: PRIVATE HEALTH INSURANCE

## 2014-03-12 DIAGNOSIS — E785 Hyperlipidemia, unspecified: Secondary | ICD-10-CM

## 2014-03-12 LAB — LIPID PANEL
CHOLESTEROL: 252 mg/dL — AB (ref 0–200)
HDL: 40.9 mg/dL (ref >39.00)
LDL CALC: 181 mg/dL — AB (ref 0–99)
NonHDL: 211.1
TRIGLYCERIDES: 153 mg/dL — AB (ref 0.0–149.0)
Total CHOL/HDL Ratio: 6
VLDL: 30.6 mg/dL (ref 0.0–40.0)

## 2014-03-17 ENCOUNTER — Telehealth: Payer: Self-pay | Admitting: Family Medicine

## 2014-03-17 ENCOUNTER — Telehealth: Payer: Self-pay

## 2014-03-17 ENCOUNTER — Encounter: Payer: Self-pay | Admitting: Family Medicine

## 2014-03-17 NOTE — Telephone Encounter (Signed)
Message copied by Marlon Pel on Tue Mar 17, 2014  4:31 PM ------      Message from: Gatha Mayer      Created: Tue Mar 17, 2014  4:11 PM      Regarding: FW: question about rpt colonoscopy       Sheri,            Please get this patient a non-urgent appt.            Thanks            CEG      ----- Message -----         From: Ria Bush, MD         Sent: 03/17/2014   4:05 PM           To: Gatha Mayer, MD      Subject: RE: question about rpt colonoscopy                       Thank you! That would be great!      javier      ----- Message -----         From: Gatha Mayer, MD         Sent: 03/17/2014  12:50 PM           To: Ria Bush, MD      Subject: RE: question about rpt colonoscopy                       I reviewed his chart            Original recall should have been 5 not 3 years based upon size and tyoe of polyp so he is fine to consider now though could even go a little longer perhaps.            I would be happy to see and review his options with him.            Shall I have my RN contact him to get an appt?            Glendell Docker      ----- Message -----         From: Ria Bush, MD         Sent: 03/17/2014  12:00 PM           To: Gatha Mayer, MD      Subject: question about rpt colonoscopy                           Hey Dr Carlean Purl,      I hope you're having a good week.      I had a question for you regarding follow up colonoscopy on this patient.  He has significant cardiac history over the last year - h/o HOCM with afib s/p maze procedure, myomectomy and MVR at [facility] with complicating resultant stroke (08/2013) on chronic coumadin therapy now followed by [facility] cards.       He has history of rectal tubular adenoma 04/2009, the rec was for f/u colonoscopy 3 yrs later, but this was deferred with recent cardiac work. Now he is asking about f/u colonoscopy and wonders about option of virtual colonoscopy as he's hesitant to stop AC, but  given his polyp location I wasn't sure what to recommend or who to refer to.  Can I send him to [facility] to discuss this? Or would you recommend referral to tertiary center?      Thanks for your input,      Garlon Hatchet                   ------

## 2014-03-17 NOTE — Telephone Encounter (Signed)
appt scheduled with the patient for 05/19/14 10:00

## 2014-03-17 NOTE — Telephone Encounter (Signed)
Sent message via mychart

## 2014-04-05 ENCOUNTER — Encounter: Payer: Self-pay | Admitting: Family Medicine

## 2014-04-07 ENCOUNTER — Telehealth: Payer: Self-pay | Admitting: Family Medicine

## 2014-04-07 ENCOUNTER — Encounter: Payer: Self-pay | Admitting: Family Medicine

## 2014-04-07 ENCOUNTER — Ambulatory Visit (INDEPENDENT_AMBULATORY_CARE_PROVIDER_SITE_OTHER): Payer: Commercial Managed Care - PPO | Admitting: Family Medicine

## 2014-04-07 VITALS — BP 110/70 | HR 80 | Temp 98.0°F | Wt 216.8 lb

## 2014-04-07 DIAGNOSIS — J019 Acute sinusitis, unspecified: Secondary | ICD-10-CM | POA: Insufficient documentation

## 2014-04-07 DIAGNOSIS — J029 Acute pharyngitis, unspecified: Secondary | ICD-10-CM

## 2014-04-07 LAB — POCT RAPID STREP A (OFFICE): RAPID STREP A SCREEN: NEGATIVE

## 2014-04-07 MED ORDER — AMOXICILLIN-POT CLAVULANATE 875-125 MG PO TABS
1.0000 | ORAL_TABLET | Freq: Two times a day (BID) | ORAL | Status: AC
Start: 2014-04-07 — End: 2014-04-17

## 2014-04-07 NOTE — Patient Instructions (Signed)
You have a sinus infection, likely viral. Push fluids and plenty of rest. Honey with lemon can soothe the throat, salt water gargles. Tylenol for discomfort. Fill antibiotic (prescription provided today) if fever >101, worsening productive cough, or persistent symptoms past 7-10 days. Let us know if not improving as expected.

## 2014-04-07 NOTE — Telephone Encounter (Signed)
Appt scheduled

## 2014-04-07 NOTE — Progress Notes (Signed)
BP 110/70  Pulse 80  Temp(Src) 98 F (36.7 C) (Oral)  Wt 216 lb 12 oz (98.317 kg)   CC: "I feel like crap" Subjective:    Patient ID: Dylan Fletcher, male    DOB: [birth year], 53 y.o.   MRN: [id number]  HPI: Dylan Fletcher is a 53 y.o. male presenting on 04/07/2014 for Sore Throat   Started feeling ill 2d ago with body soreness. Yesterday felt better, today felt worse again - has been working from home. ST, dry coughing, fatigue. + RN and PNDrainage, sneezing.  No fevers, chest pain or wheezing or dyspnea, ear or tooth pain, headache.  Has tried tylenol for malaise.   No sick contacts at home. No smokers at home. No h/o asthma. No h/o allergic rhinitis.  Relevant past medical, surgical, family and social history reviewed and updated as indicated.  Allergies and medications reviewed and updated. Current Outpatient Prescriptions on File Prior to Visit  Medication Sig  . acetaminophen (TYLENOL) 325 MG tablet Take 650 mg by mouth as needed.  Marland Kitchen amoxicillin (AMOXIL) 500 MG tablet Take 500 mg by mouth as directed. 1 BID; start 24 hours prior to dental procedure and 24 hours post dental procedure  . amphetamine-dextroamphetamine (ADDERALL) 30 MG tablet Take 30 mg by mouth as directed. 30 mg QAM; 30 mg lunchtime; 15 mg late afternoon  . Cholecalciferol (VITAMIN D) 2000 UNITS CAPS Take 1 capsule by mouth daily.  Marland Kitchen escitalopram (LEXAPRO) 20 MG tablet Take 20 mg by mouth daily.  . ferrous sulfate 324 (65 FE) MG TBEC Take 1 tablet (325 mg total) by mouth every Monday, Wednesday, and Friday.  . furosemide (LASIX) 20 MG tablet Take 20 mg by mouth daily.  . metoprolol succinate (TOPROL-XL) 25 MG 24 hr tablet Take 1 tablet (25 mg total) by mouth daily.  . Multiple Vitamin (MULTIVITAMIN) tablet Take 1 tablet by mouth daily.  Marland Kitchen oxyCODONE-acetaminophen (PERCOCET/ROXICET) 5-325 MG per tablet Take 1 tablet by mouth every 4 (four) hours as needed for severe pain.  . sildenafil (VIAGRA) 100 MG  tablet Take 1 tablet (100 mg total) by mouth daily as needed for erectile dysfunction.  Marland Kitchen warfarin (COUMADIN) 5 MG tablet Take 5 mg by mouth as directed. Mon,Thurs  . warfarin (COUMADIN) 7.5 MG tablet Take 7.5 mg by mouth as directed. Tues,W,F,Sat, Sun   No current facility-administered medications on file prior to visit.    Review of Systems Per HPI unless specifically indicated above    Objective:    BP 110/70  Pulse 80  Temp(Src) 98 F (36.7 C) (Oral)  Wt 216 lb 12 oz (98.317 kg)  Physical Exam  Nursing note and vitals reviewed. Constitutional: He appears well-developed and well-nourished. No distress.  HENT:  Head: Normocephalic and atraumatic.  Right Ear: Hearing, tympanic membrane, external ear and ear canal normal.  Left Ear: Hearing, tympanic membrane, external ear and ear canal normal.  Nose: No mucosal edema or rhinorrhea. Right sinus exhibits frontal sinus tenderness. Right sinus exhibits no maxillary sinus tenderness. Left sinus exhibits frontal sinus tenderness. Left sinus exhibits no maxillary sinus tenderness.  Mouth/Throat: Uvula is midline and mucous membranes are normal. Posterior oropharyngeal edema and posterior oropharyngeal erythema present. No oropharyngeal exudate or tonsillar abscesses.  Raw posterior oropharynx  Eyes: Conjunctivae and EOM are normal. Pupils are equal, round, and reactive to light. No scleral icterus.  Neck: Normal range of motion. Neck supple.  Cardiovascular: Normal rate, regular rhythm, normal heart sounds and intact distal pulses.  No murmur heard. Pulmonary/Chest: Effort normal and breath sounds normal. No respiratory distress. He has no wheezes. He has no rales.  Lymphadenopathy:    He has no cervical adenopathy.  Skin: Skin is warm and dry. No rash noted.   Results for orders placed in visit on 04/07/14  POCT RAPID STREP A (OFFICE)      Result Value Ref Range   Rapid Strep A Screen Negative  Negative      Assessment & Plan:     Problem List Items Addressed This Visit   Acute sinusitis - Primary     Anticipate viral infection given short duration. Treat with tylenol and further supportive care as per instructions Discussed reasons to start antibiotic including fever >101, worsening productive cough, or persistent sxs past 7-10 days. Discussed anticipated course of resolution of illness.    Relevant Medications      amoxicillin-clavulanate (AUGMENTIN) tablet 875-125 mg    Other Visit Diagnoses   Sore throat        Relevant Orders       POCT rapid strep A (Completed)        Follow up plan: Return if symptoms worsen or fail to improve.

## 2014-04-07 NOTE — Telephone Encounter (Signed)
plz work patient in this afternoon.

## 2014-04-07 NOTE — Assessment & Plan Note (Signed)
Anticipate viral infection given short duration. Treat with tylenol and further supportive care as per instructions Discussed reasons to start antibiotic including fever >101, worsening productive cough, or persistent sxs past 7-10 days. Discussed anticipated course of resolution of illness.

## 2014-04-07 NOTE — Progress Notes (Signed)
Pre visit review using our clinic review tool, if applicable. No additional management support is needed unless otherwise documented below in the visit note. 

## 2014-04-07 NOTE — Telephone Encounter (Signed)
Patient Information:  Caller Name: Fionn  Phone: [phone]  Patient: Dylan Fletcher, Dylan Fletcher  Gender: Male  DOB: 1960-11-15  Age: 53 Years  PCP: Ria Bush [facility]  Office Follow Up:  Does the office need to follow up with this patient?: Yes  Instructions For The Office: No appts. available at [facility] office.  Patient declines an appt. at another office location. Patient is requesting a possible work in appt. with [name]. Patient is verbalizing concern due to having a Mitral Valve replacement 08/2013. Patient uses [facility] at [address] at [phone].  Please return call to patient at [phone] with Provider recommendation.  RN Note:  Patient states he developed a Sore Throat, onset 04/05/14. Patient states he has an occasional, mild, non-productive cough. Patient is taking fluids well. Patient states he can visualize white exudate on his tonsils.  Care advice given per guidelines. Call back parameters reviewed. Patient verbalizes understanding. No appts. available at [facility] office.  Patient declines an appt. at another office location. Patient is requesting a possible work in appt. with [name]. Patient is verbalizing concern due to having a Mitral Valve replacement 08/2013. Patient uses [facility] at [address] at [phone].  Please return call to patient at [phone] with Provider recommendation.   Symptoms  Reason For Call & Symptoms: Sore throat  Reviewed Health History In EMR: Yes  Reviewed Medications In EMR: Yes  Reviewed Allergies In EMR: Yes  Reviewed Surgeries / Procedures: Yes  Date of Onset of Symptoms: 04/05/2014  Guideline(s) Used:  Sore Throat  Disposition Per Guideline:   Strep Test Only Visit Today or Tomorrow  Reason For Disposition Reached:   Sore throat is the main symptom and persists > 48 hours  Advice Given:  For Relief of Sore Throat Pain:  Sip warm chicken broth or apple  juice.  Suck on hard candy or a throat lozenge (over-the-counter).  Gargle warm salt water 3 times daily (1 teaspoon of salt in 8 oz or 240 ml of warm water).  Avoid cigarette smoke.  Pain Medicines:  Acetaminophen (e.g., Tylenol):  Regular Strength Tylenol: Take 650 mg (two 325 mg pills) by mouth every 4-6 hours as needed. Each Regular Strength Tylenol pill has 325 mg of acetaminophen.  Extra Strength Tylenol: Take 1,000 mg (two 500 mg pills) every 8 hours as needed. Each Extra Strength Tylenol pill has 500 mg of acetaminophen.  The most you should take each day is 3,000 mg (10 Regular Strength or 6 Extra Strength pills a day).  Soft Diet:   Cold drinks and milk shakes are especially good (Reason: swollen tonsils can make some foods hard to swallow).  Liquids:  Adequate liquid intake is important to prevent dehydration. Drink 6-8 glasses of water per day.  Call Back If:  You become worse.  Patient Will Follow Care Advice:  YES

## 2014-04-19 HISTORY — PX: OTHER SURGICAL HISTORY: SHX169

## 2014-04-19 HISTORY — PX: COLONOSCOPY: SHX174

## 2014-04-30 DIAGNOSIS — Z5181 Encounter for therapeutic drug level monitoring: Secondary | ICD-10-CM | POA: Insufficient documentation

## 2014-05-01 ENCOUNTER — Encounter: Payer: Self-pay | Admitting: Family Medicine

## 2014-05-05 ENCOUNTER — Encounter: Payer: Self-pay | Admitting: Family Medicine

## 2014-05-06 ENCOUNTER — Encounter: Payer: Self-pay | Admitting: Family Medicine

## 2014-05-06 DIAGNOSIS — R200 Anesthesia of skin: Secondary | ICD-10-CM | POA: Insufficient documentation

## 2014-05-09 ENCOUNTER — Encounter: Payer: Self-pay | Admitting: Family Medicine

## 2014-05-19 ENCOUNTER — Ambulatory Visit: Payer: PRIVATE HEALTH INSURANCE | Admitting: Internal Medicine

## 2014-05-19 DIAGNOSIS — R404 Transient alteration of awareness: Secondary | ICD-10-CM

## 2014-05-19 HISTORY — DX: Transient alteration of awareness: R40.4

## 2014-06-28 ENCOUNTER — Encounter: Payer: Self-pay | Admitting: Family Medicine

## 2014-07-13 NOTE — Telephone Encounter (Addendum)
plz request copy of colonoscopy by Dr Orlean Bradford at [facility] ( fax at [phone].) I can't find it in care everywhere

## 2014-07-20 ENCOUNTER — Encounter: Payer: Self-pay | Admitting: Family Medicine

## 2014-08-22 ENCOUNTER — Encounter: Payer: Self-pay | Admitting: Family Medicine

## 2014-08-23 IMAGING — CR DG CHEST 2V
2 series · 2 of 2 positions shown · non-contrast
Comparison: None.

CLINICAL DATA: Cough. Shortness of breath. Hypertrophic obstructive
cardiomyopathy.

EXAM:
CHEST  2 VIEW

[view not recorded (1 of 2)]
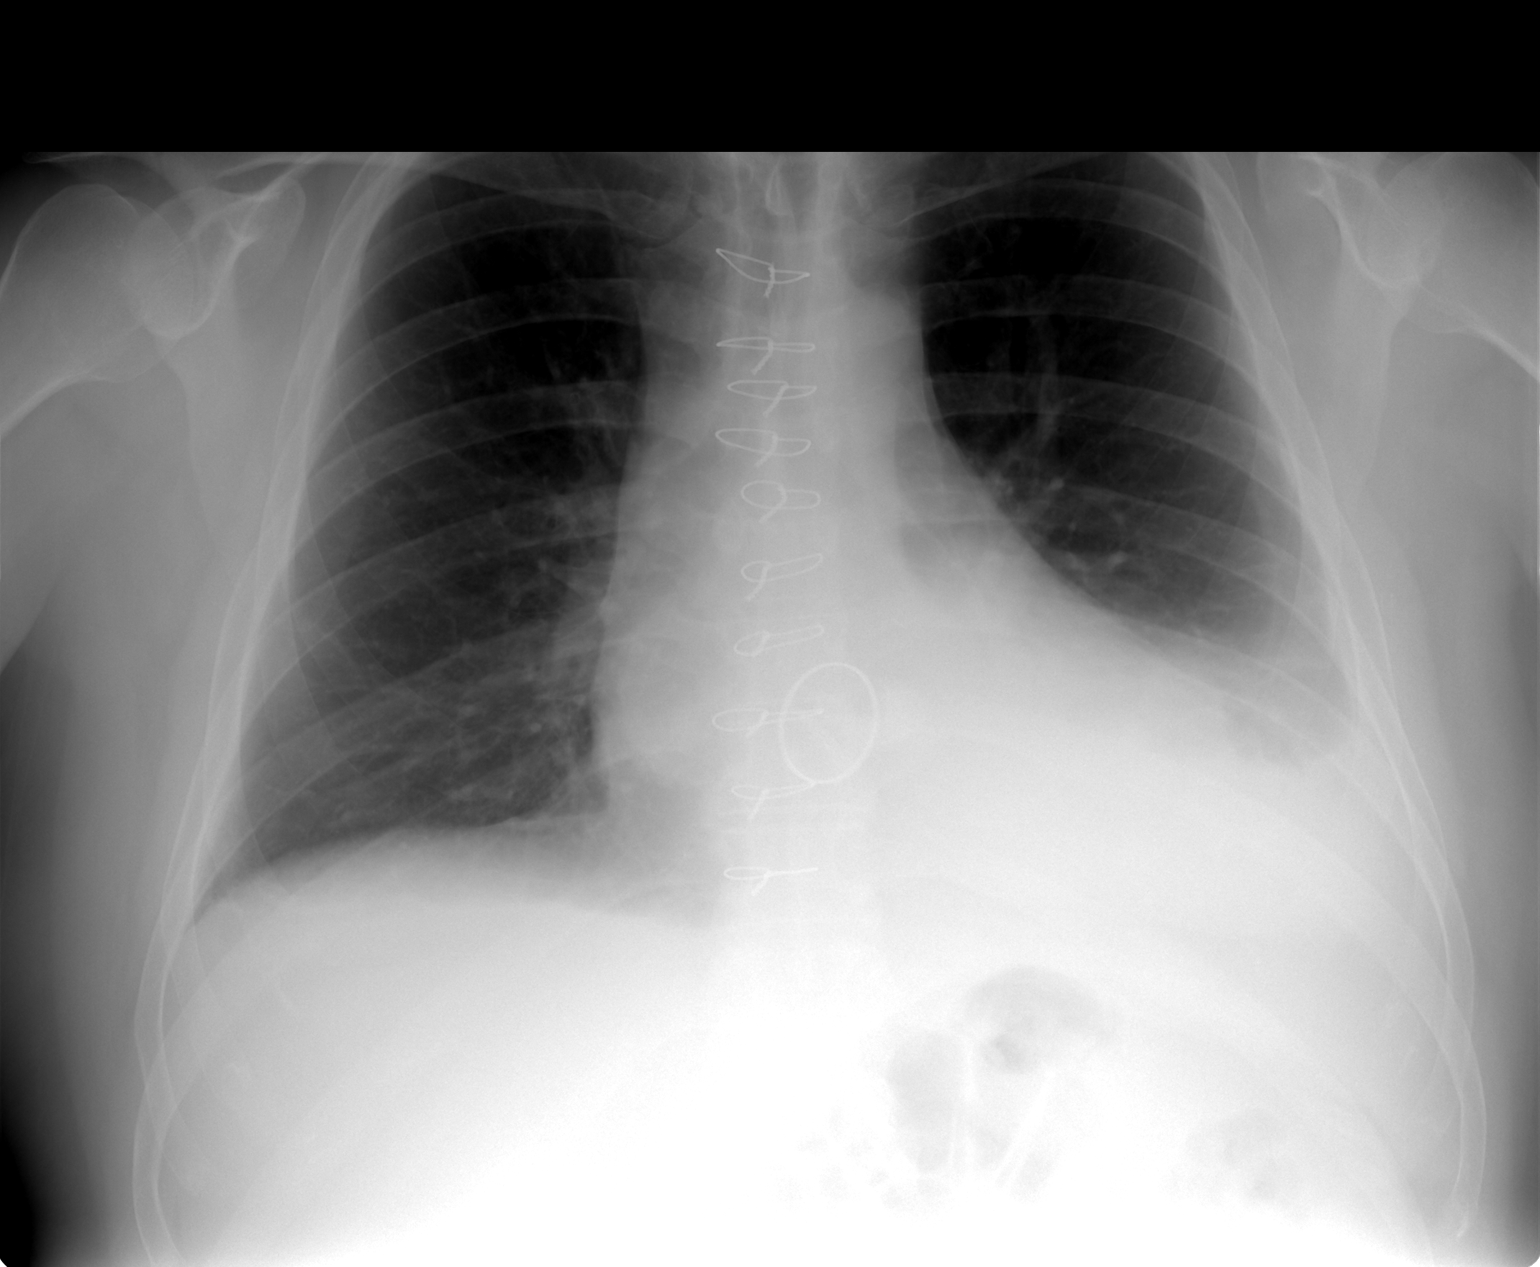

[view not recorded (2 of 2)]
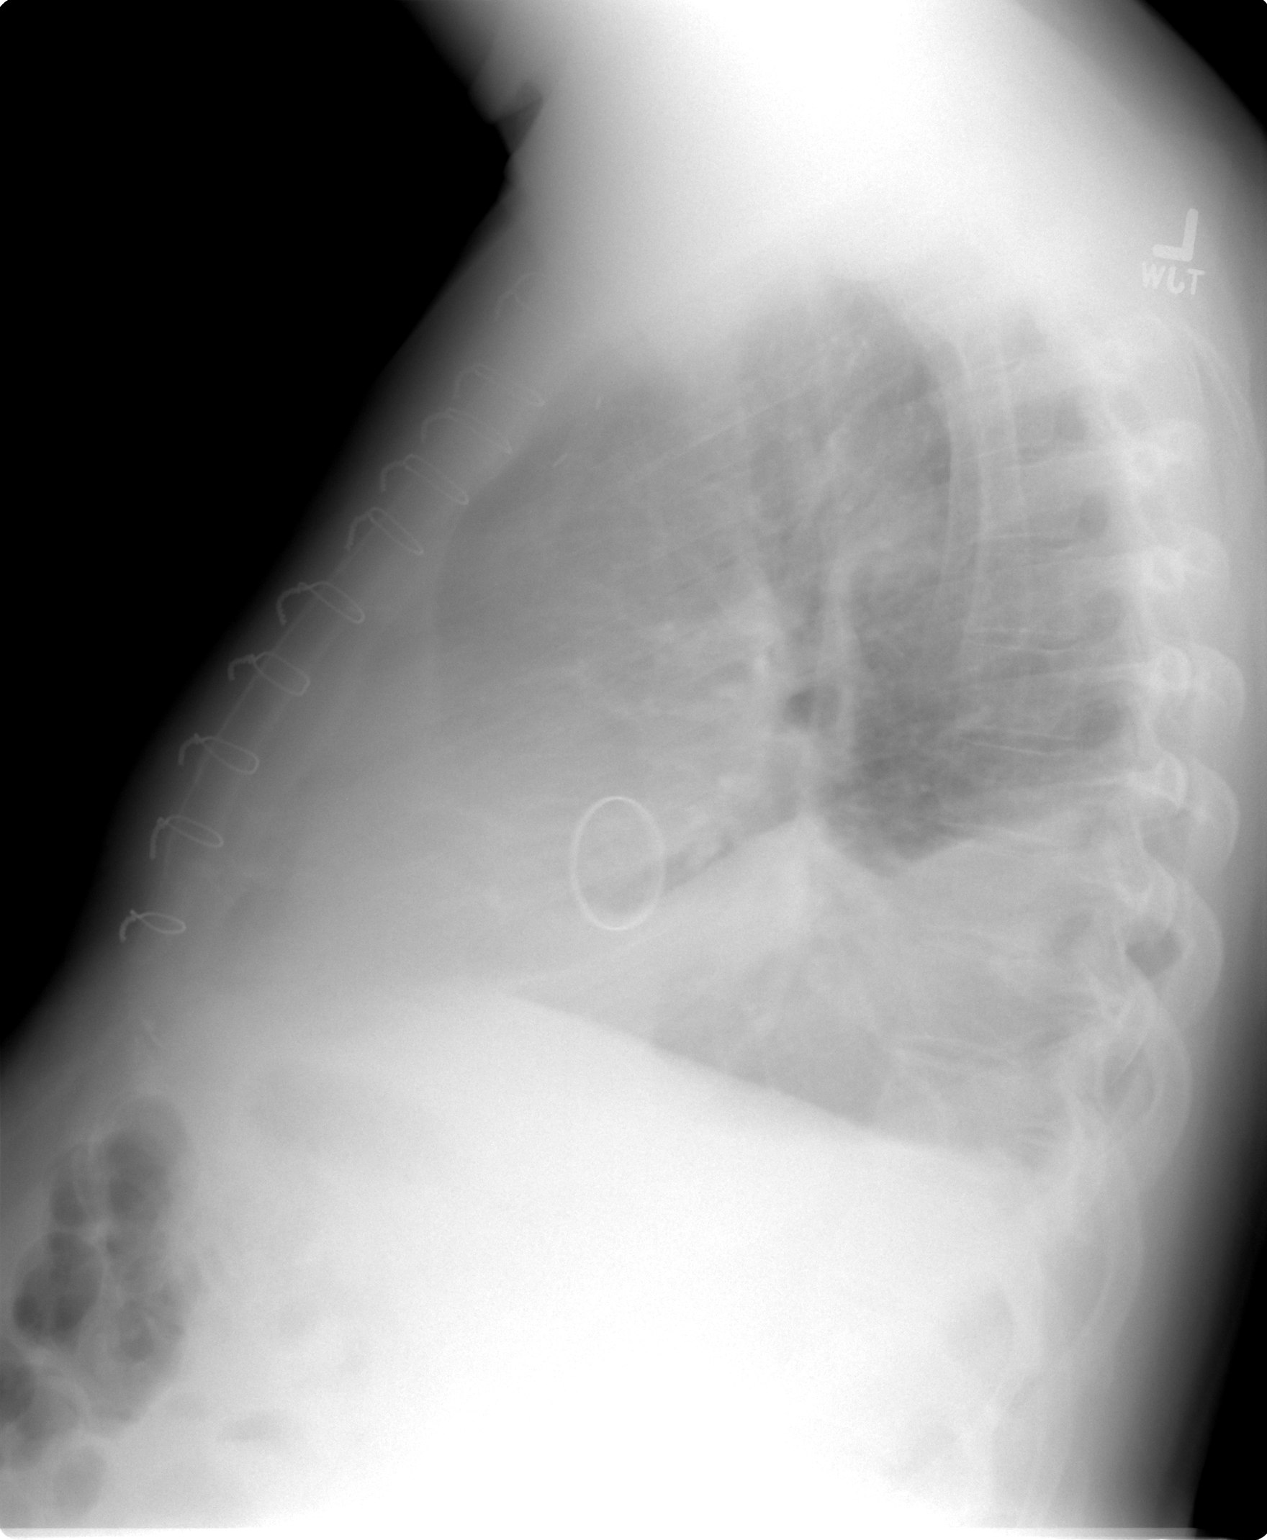

[2 of 2 positions shown; findings below may reference images not displayed]

FINDINGS: Mitral valve prosthesis noted. Moderate left pleural effusion with
passive atelectasis. The right lung appears clear. Mild
cardiomegaly.
IMPRESSION: 1. Moderate left pleural effusion with associated passive
atelectasis.
2. Mitral valve prosthesis.
3. Mild cardiomegaly.

## 2014-09-08 ENCOUNTER — Encounter: Payer: Self-pay | Admitting: Family Medicine

## 2014-09-08 ENCOUNTER — Ambulatory Visit (INDEPENDENT_AMBULATORY_CARE_PROVIDER_SITE_OTHER): Payer: Commercial Managed Care - PPO | Admitting: Family Medicine

## 2014-09-08 VITALS — BP 122/84 | HR 88 | Temp 98.4°F | Wt 231.8 lb

## 2014-09-08 DIAGNOSIS — E669 Obesity, unspecified: Secondary | ICD-10-CM

## 2014-09-08 DIAGNOSIS — E785 Hyperlipidemia, unspecified: Secondary | ICD-10-CM

## 2014-09-08 DIAGNOSIS — I10 Essential (primary) hypertension: Secondary | ICD-10-CM | POA: Diagnosis not present

## 2014-09-08 DIAGNOSIS — I639 Cerebral infarction, unspecified: Secondary | ICD-10-CM

## 2014-09-08 DIAGNOSIS — D649 Anemia, unspecified: Secondary | ICD-10-CM

## 2014-09-08 DIAGNOSIS — G4733 Obstructive sleep apnea (adult) (pediatric): Secondary | ICD-10-CM

## 2014-09-08 DIAGNOSIS — I421 Obstructive hypertrophic cardiomyopathy: Secondary | ICD-10-CM | POA: Diagnosis not present

## 2014-09-08 NOTE — Assessment & Plan Note (Addendum)
Chronic, stable. Continue toprol XL and lasix.

## 2014-09-08 NOTE — Patient Instructions (Addendum)
You are doing well today. We need to keep an eye on weight.  Let's stop iron. We will recheck levels at your physical. Return in 6 months for physical. We will refer you to a nutritionist. Check with Dr Elba Barman regarding exercise clearance. Then if ok with him slowly increase aerobic exercise.

## 2014-09-08 NOTE — Progress Notes (Signed)
Pre visit review using our clinic review tool, if applicable. No additional management support is needed unless otherwise documented below in the visit note. 

## 2014-09-08 NOTE — Assessment & Plan Note (Signed)
rec stop iron. Check iron panel next labs.

## 2014-09-08 NOTE — Assessment & Plan Note (Signed)
s/p surgery. Sees Dr Elba Barman at [facility].

## 2014-09-08 NOTE — Assessment & Plan Note (Signed)
Compliant with CPAP, has device chekced regularly

## 2014-09-08 NOTE — Progress Notes (Signed)
BP 122/84 mmHg  Pulse 88  Temp(Src) 98.4 F (36.9 C)  Wt 231 lb 12.8 oz (105.144 kg)   CC: f/u visit  Subjective:    Patient ID: Dylan Fletcher, male    DOB: 11/21/1960, 54 y.o.   MRN: [id number]  HPI: Dylan Fletcher is a 54 y.o. male presenting on 09/08/2014 for Follow-up   Pleasant 54 yo s/p recent myomectomy and biatrial MAZE procedure for his HOCM with symptomatic atrial fibrillation at [facility] 08/2013. Surgery complicated by severe central mitral regurgitation leading to MVR with St Jude valve a few days later. On chronic anticoagulation with coumadin. Hospitalization also complicated by C diff infection s/p oral vancomycin course and mild CVA with residual aphasia postop.  Presents today for 6 mo f/u visit. Feels he's doing well. However he does notice persistent fatigue. Averages 10 hours of sleep. Uses CPAP 16 mmHg for OSA. Last checked 04/2014.   Was given clearance to hold statin for 1 month to see effect on memory. He has held atorvastatin 10mg  dose for 1 month and he feels this has helped.   Weight gain noted. Pt has noted he's putting on weight. Doesn't feel eating significantly more. Feels he stays active. Treadmill or TV 25-30 min 5d/wk. Interested in nutritionist referral.  Relevant past medical, surgical, family and social history reviewed and updated as indicated. Interim medical history since our last visit reviewed. Allergies and medications reviewed and updated. Current Outpatient Prescriptions on File Prior to Visit  Medication Sig  . acetaminophen (TYLENOL) 325 MG tablet Take 650 mg by mouth as needed.  Marland Kitchen amoxicillin (AMOXIL) 500 MG tablet Take 500 mg by mouth as directed. 1 BID; start 24 hours prior to dental procedure and 24 hours post dental procedure  . amphetamine-dextroamphetamine (ADDERALL) 30 MG tablet Take 30 mg by mouth as directed. 30 mg QAM; 30 mg lunchtime; 15 mg late afternoon  . Cholecalciferol (VITAMIN D) 2000 UNITS CAPS Take 1  capsule by mouth daily.  Marland Kitchen escitalopram (LEXAPRO) 20 MG tablet Take 10 mg by mouth daily.   . ferrous sulfate 324 (65 FE) MG TBEC Take 1 tablet (325 mg total) by mouth every Monday, Wednesday, and Friday.  . furosemide (LASIX) 20 MG tablet Take 20 mg by mouth daily.  . metoprolol succinate (TOPROL-XL) 25 MG 24 hr tablet Take 1 tablet (25 mg total) by mouth daily.  . Multiple Vitamin (MULTIVITAMIN) tablet Take 1 tablet by mouth daily.  Marland Kitchen oxyCODONE-acetaminophen (PERCOCET/ROXICET) 5-325 MG per tablet Take 1 tablet by mouth every 4 (four) hours as needed for severe pain.  . sildenafil (VIAGRA) 100 MG tablet Take 1 tablet (100 mg total) by mouth daily as needed for erectile dysfunction.  Marland Kitchen warfarin (COUMADIN) 5 MG tablet Take 5 mg by mouth as directed. Saturday  . warfarin (COUMADIN) 7.5 MG tablet Take 7.5 mg by mouth as directed. Mon, Tues, Wed, Thurs, Fri, Sun   No current facility-administered medications on file prior to visit.    Review of Systems Per HPI unless specifically indicated above     Objective:    BP 122/84 mmHg  Pulse 88  Temp(Src) 98.4 F (36.9 C)  Wt 231 lb 12.8 oz (105.144 kg)  Wt Readings from Last 3 Encounters:  09/08/14 231 lb 12.8 oz (105.144 kg)  04/07/14 216 lb 12 oz (98.317 kg)  03/10/14 209 lb 8 oz (95.029 kg)    Physical Exam  Constitutional: He appears well-developed and well-nourished. No distress.  HENT:  Mouth/Throat:  Oropharynx is clear and moist. No oropharyngeal exudate.  Cardiovascular: Normal rate, regular rhythm and intact distal pulses.   Murmur (soft click LUSB) heard. Pulmonary/Chest: Effort normal and breath sounds normal. No respiratory distress. He has no wheezes. He has no rales.  Musculoskeletal: He exhibits no edema.  Psychiatric: He has a normal mood and affect.  Nursing note and vitals reviewed.      Assessment & Plan:   Problem List Items Addressed This Visit    OSA (obstructive sleep apnea)    Compliant with CPAP, has  device chekced regularly      Relevant Orders   Amb ref to Medical Nutrition Therapy-MNT   Obesity    Body mass index is 37.43 kg/(m^2).  Marked weight gain noted over last 6 months - discussed healthy diet and lifestyle changes to affect sustainable weight loss. I asked him to get Dr Meredith Staggers recs regarding limitations to exercise.  Referred to nutritionist per pt request today. Limited diet 2/2 warfarin use.       Relevant Orders   Amb ref to Medical Nutrition Therapy-MNT   Hypertrophic obstructive cardiomyopathy    s/p surgery. Sees [facility] Elba Barman at [facility].      Relevant Orders   Amb ref to Medical Nutrition Therapy-MNT   HTN (hypertension) - Primary    Chronic, stable. Continue toprol XL and lasix.      Relevant Orders   Amb ref to Medical Nutrition Therapy-MNT   HLD (hyperlipidemia)    Currently off lipitor - to check with cards re recommendations given he did notice improvement in memory off statin.      Relevant Orders   Amb ref to Medical Nutrition Therapy-MNT   CVA (cerebral vascular accident)   Anemia    rec stop iron. Check iron panel next labs.          Follow up plan: Return in about 6 months (around 03/11/2015), or as needed, for annual exam, prior fasting for blood work.

## 2014-09-08 NOTE — Assessment & Plan Note (Signed)
Currently off lipitor - to check with cards re recommendations given he did notice improvement in memory off statin.

## 2014-09-08 NOTE — Assessment & Plan Note (Signed)
Body mass index is 37.43 kg/(m^2).  Marked weight gain noted over last 6 months - discussed healthy diet and lifestyle changes to affect sustainable weight loss. I asked him to get Dr Meredith Staggers recs regarding limitations to exercise.  Referred to nutritionist per pt request today. Limited diet 2/2 warfarin use.

## 2014-12-02 ENCOUNTER — Ambulatory Visit (INDEPENDENT_AMBULATORY_CARE_PROVIDER_SITE_OTHER): Payer: Commercial Managed Care - PPO | Admitting: Family Medicine

## 2014-12-02 ENCOUNTER — Encounter: Payer: Self-pay | Admitting: Family Medicine

## 2014-12-02 VITALS — BP 140/90 | HR 88 | Temp 97.4°F | Wt 233.0 lb

## 2014-12-02 DIAGNOSIS — I447 Left bundle-branch block, unspecified: Secondary | ICD-10-CM | POA: Diagnosis not present

## 2014-12-02 DIAGNOSIS — E049 Nontoxic goiter, unspecified: Secondary | ICD-10-CM | POA: Diagnosis not present

## 2014-12-02 DIAGNOSIS — R0989 Other specified symptoms and signs involving the circulatory and respiratory systems: Secondary | ICD-10-CM | POA: Insufficient documentation

## 2014-12-02 DIAGNOSIS — E010 Iodine-deficiency related diffuse (endemic) goiter: Secondary | ICD-10-CM

## 2014-12-02 LAB — T4, FREE: Free T4: 0.89 ng/dL (ref 0.60–1.60)

## 2014-12-02 LAB — CBC WITH DIFFERENTIAL/PLATELET
Basophils Absolute: 0.1 10*3/uL (ref 0.0–0.1)
Basophils Relative: 0.7 % (ref 0.0–3.0)
EOS ABS: 0.1 10*3/uL (ref 0.0–0.7)
Eosinophils Relative: 1.6 % (ref 0.0–5.0)
HCT: 46.3 % (ref 39.0–52.0)
Hemoglobin: 15.6 g/dL (ref 13.0–17.0)
LYMPHS ABS: 2.4 10*3/uL (ref 0.7–4.0)
LYMPHS PCT: 28.4 % (ref 12.0–46.0)
MCHC: 33.8 g/dL (ref 30.0–36.0)
MCV: 89.5 fl (ref 78.0–100.0)
MONOS PCT: 9.9 % (ref 3.0–12.0)
Monocytes Absolute: 0.9 10*3/uL (ref 0.1–1.0)
NEUTROS ABS: 5.1 10*3/uL (ref 1.4–7.7)
NEUTROS PCT: 59.4 % (ref 43.0–77.0)
Platelets: 273 10*3/uL (ref 150.0–400.0)
RBC: 5.18 Mil/uL (ref 4.22–5.81)
RDW: 13.6 % (ref 11.5–15.5)
WBC: 8.6 10*3/uL (ref 4.0–10.5)

## 2014-12-02 LAB — TSH: TSH: 0.59 u[IU]/mL (ref 0.35–4.50)

## 2014-12-02 NOTE — Progress Notes (Signed)
Pre visit review using our clinic review tool, if applicable. No additional management support is needed unless otherwise documented below in the visit note. 

## 2014-12-02 NOTE — Assessment & Plan Note (Signed)
Lump in throat feeling without pharyngitis sxs, associated with no other symptoms.  Did not check RST today. On exam mild right sided thyromegaly without nodule.  Will check CBC, TSH/free T4/T3 and order thyroid US to further eval for transient thyroiditis.

## 2014-12-02 NOTE — Patient Instructions (Signed)
Let's check thyroid ultrasound as well as blood work today. Pass by Marion's office to schedule thyroid ultrasound.

## 2014-12-02 NOTE — Progress Notes (Signed)
BP 140/90 mmHg  Pulse 88  Temp(Src) 97.4 F (36.3 C) (Oral)  Wt 233 lb (105.688 kg)   CC: "I have a lump in my throat"  Subjective:    Patient ID: Dylan Fletcher, male    DOB: February 01, 1961, 54 y.o.   MRN: [id number]  HPI: Dylan Fletcher is a 54 y.o. male presenting on 12/02/2014 for Check throat   5d h/o "something stuck in throat". No pain, fevers/chill, dysphagia, ST, PNdrainage, congestion or rhinorrhea, cough. No GERD sxs. Saw nurse at work who felt lump in throat.   No med changes recently.  So far hasn't tried anything for this. No new foods.  No personal or fmhx thyroid disease.  Relevant past medical, surgical, family and social history reviewed and updated as indicated. Interim medical history since our last visit reviewed. Allergies and medications reviewed and updated. Current Outpatient Prescriptions on File Prior to Visit  Medication Sig  . acetaminophen (TYLENOL) 325 MG tablet Take 650 mg by mouth as needed.  Marland Kitchen amoxicillin (AMOXIL) 500 MG tablet Take 500 mg by mouth as directed. 1 BID; start 24 hours prior to dental procedure and 24 hours post dental procedure  . amphetamine-dextroamphetamine (ADDERALL) 30 MG tablet Take 30 mg by mouth as directed. 30 mg QAM; 30 mg lunchtime; 15 mg late afternoon  . buPROPion (WELLBUTRIN XL) 150 MG 24 hr tablet Take 150 mg by mouth daily.  . Cholecalciferol (VITAMIN D) 2000 UNITS CAPS Take 1 capsule by mouth daily.  . furosemide (LASIX) 20 MG tablet Take 20 mg by mouth daily.  . Glucosamine Sulfate 500 MG CAPS Take 1 capsule by mouth 3 (three) times daily.  . metoprolol succinate (TOPROL-XL) 25 MG 24 hr tablet Take 1 tablet (25 mg total) by mouth daily.  . Multiple Vitamin (MULTIVITAMIN) tablet Take 1 tablet by mouth daily.  Marland Kitchen oxyCODONE-acetaminophen (PERCOCET/ROXICET) 5-325 MG per tablet Take 1 tablet by mouth every 4 (four) hours as needed for severe pain.  . sildenafil (VIAGRA) 100 MG tablet Take 1 tablet (100 mg total) by  mouth daily as needed for erectile dysfunction.  Marland Kitchen warfarin (COUMADIN) 7.5 MG tablet Take 7.5 mg by mouth as directed.    No current facility-administered medications on file prior to visit.    Review of Systems Per HPI unless specifically indicated above     Objective:    BP 140/90 mmHg  Pulse 88  Temp(Src) 97.4 F (36.3 C) (Oral)  Wt 233 lb (105.688 kg)  Wt Readings from Last 3 Encounters:  12/02/14 233 lb (105.688 kg)  09/08/14 231 lb 12.8 oz (105.144 kg)  04/07/14 216 lb 12 oz (98.317 kg)    Physical Exam  Constitutional: He appears well-developed and well-nourished. No distress.  HENT:  Head: Normocephalic and atraumatic.  Mouth/Throat: Uvula is midline and mucous membranes are normal. Posterior oropharyngeal erythema (raw oropharynx) present. No oropharyngeal exudate, posterior oropharyngeal edema or tonsillar abscesses.  Eyes: Conjunctivae and EOM are normal. Pupils are equal, round, and reactive to light.  Neck: Normal range of motion. Neck supple. No tracheal deviation present. Thyromegaly (R sided, mild discomfort to palpation) present.  Musculoskeletal: He exhibits no edema.  Lymphadenopathy:    He has no cervical adenopathy.  Skin: Skin is warm and dry. No rash noted.  Psychiatric: He has a normal mood and affect.  Nursing note and vitals reviewed.     Assessment & Plan:   Problem List Items Addressed This Visit    LBBB (left bundle branch  block)   Thyromegaly - Primary    Lump in throat feeling without pharyngitis sxs, associated with no other symptoms.  Did not check RST today. On exam mild right sided thyromegaly without nodule.  Will check CBC, TSH/free T4/T3 and order thyroid US to further eval for transient thyroiditis.      Relevant Orders   US Soft Tissue Head/Neck   TSH   T4, free   T3   CBC with Differential/Platelet       Follow up plan: No Follow-up on file.

## 2014-12-03 LAB — T3: T3, Total: 113.8 ng/dL (ref 80.0–204.0)

## 2014-12-04 ENCOUNTER — Ambulatory Visit
Admission: RE | Admit: 2014-12-04 | Discharge: 2014-12-04 | Disposition: A | Discharge status: Home / Self Care | Payer: Commercial Managed Care - PPO | Source: Ambulatory Visit | Attending: Family Medicine | Admitting: Family Medicine

## 2014-12-04 DIAGNOSIS — E010 Iodine-deficiency related diffuse (endemic) goiter: Secondary | ICD-10-CM

## 2014-12-04 DIAGNOSIS — E049 Nontoxic goiter, unspecified: Secondary | ICD-10-CM | POA: Diagnosis not present

## 2014-12-06 ENCOUNTER — Encounter: Payer: Self-pay | Admitting: Family Medicine

## 2014-12-07 ENCOUNTER — Ambulatory Visit: Payer: Commercial Managed Care - PPO

## 2014-12-09 ENCOUNTER — Other Ambulatory Visit: Payer: Self-pay | Admitting: Family Medicine

## 2014-12-09 DIAGNOSIS — R0989 Other specified symptoms and signs involving the circulatory and respiratory systems: Secondary | ICD-10-CM

## 2014-12-18 ENCOUNTER — Encounter: Payer: Commercial Managed Care - PPO | Attending: Family Medicine | Admitting: Dietician

## 2014-12-18 ENCOUNTER — Encounter: Payer: Self-pay | Admitting: Dietician

## 2014-12-18 VITALS — Ht 66.0 in | Wt 231.0 lb

## 2014-12-18 DIAGNOSIS — E785 Hyperlipidemia, unspecified: Secondary | ICD-10-CM | POA: Diagnosis present

## 2014-12-18 DIAGNOSIS — I421 Obstructive hypertrophic cardiomyopathy: Secondary | ICD-10-CM

## 2014-12-18 DIAGNOSIS — E669 Obesity, unspecified: Secondary | ICD-10-CM | POA: Insufficient documentation

## 2014-12-18 DIAGNOSIS — I10 Essential (primary) hypertension: Secondary | ICD-10-CM | POA: Insufficient documentation

## 2014-12-18 NOTE — Patient Instructions (Signed)
Balance meals with protein, 2-4 servings of carbohydrate and non-starchy vegetables. Spread 13 servings of carbohydrate over 3 meals and 2-3 snacks. Choose egg on English muffin rather than  2 sausage burritos for breakfast. Take a lunch to work so can control fat and sodium better.  Control portions at evening meal. Use guide to know portions. Measure some of the starchy choices so can better judge amounts better.

## 2014-12-18 NOTE — Progress Notes (Signed)
Medical Nutrition Therapy: Visit start time: 13:40  end time: 14:50pm  Assessment:  Diagnosis: obesity, hypertension, hyperlipidemia, sleep apnea, cardiomyopathy Psychosocial issues/ stress concerns: Patient rates his stress as low. Preferred learning method:  . Visual  Current weight: 231 lbs  Height: 66 in Medications, supplements: see list Progress and evaluation:  Patient accompanied by his wife in for initial nutrition assessment. He expressed frustration regarding weight gain of approx. 30 lbs in the last year following his heart surgery. Problem areas are frequent eating "out" making sodium, fat and calories hard to control. He also works through lunch on some days and is extremely hungry  and eats large portions for evening meal. His beverages are diet soda, sugar-free Koolaid and some water. He has stopped adding salt to foods. Physical activity: walks for exercise 4-5 days per week for 1 hour.  Dietary Intake:  Usual eating pattern includes 2-3 meals and 2-3 snacks per day. Dining out frequency: 9-10 meals per week.  Breakfast: 2 sausage burritos on way to work, diet soda or at home may eat oatmeal or eggs/tortillas Snack: vending machine snack Lunch: eats "out" 5 days/week- [employer] or [employer] or [employer] Supper: Ex. 2 hamburger patties and vegetables and starch; most dinner meals are prepared at home. Snack: cottage cheese or nuts   Nutrition Care Education:  Weight control: Instructed on a meal plan based on 1800 calories. Meal by meal discussed changes that could be made to lower fat, sodium and calories. Discussed the importance of spacing meals 4-6 hours apart to avoid excessive hunger. Discussed lunches patient can pack as well as healthier snacks. Also, discussed healthier choices when eating "out". Hypertension:  importance of controlling BP, identifying high sodium foods. Discussed specific low sodium products for patient to try. During visit, using  calorieking website was able to show patient sodium in 2 sausage burritos that totalled over 1500mg  sodium and discussed alternatives.   Nutritional Diagnosis:  NI-1.5 Excessive energy intake As related to frequent dining out, high fat choices, large portions at evening meal.  As evidenced by 30 lb weight gain in past year.  Intervention/Goals: Balance meals with protein, 2-4 servings of carbohydrate and non-starchy vegetables. Spread 13 servings of carbohydrate over 3 meals and 2-3 snacks. Choose egg on English muffin rather than  2 sausage burritos for breakfast. Take a lunch to work so can control fat and sodium better.  Control portions at evening meal. Use guide to know portions. Measure some of the starchy choices so can better judge amounts better. Use "My Fitness Pal" to look up sodium, fat and calories especially in foods when dining "out".  Education Materials given:  . General diet guidelines for Hypertension . Food lists/ Planning A Balanced Meal . Goals/ instructions Learner/ who was taught:  . Patient and spouse  Level of understanding: Marland Kitchen Verbalizes/ demonstrates competency Learning barriers: . None  Willingness to learn/ readiness for change: . Eager, change in progress  Monitoring and Evaluation:  No follow-up scheduled. Pt. To call if desires further help with his diet/nutrition.

## 2014-12-19 ENCOUNTER — Encounter: Payer: Self-pay | Admitting: Family Medicine

## 2014-12-24 ENCOUNTER — Ambulatory Visit: Payer: Commercial Managed Care - PPO

## 2014-12-26 ENCOUNTER — Encounter: Payer: Self-pay | Admitting: Family Medicine

## 2014-12-31 ENCOUNTER — Encounter: Payer: Self-pay | Admitting: Family Medicine

## 2015-02-01 ENCOUNTER — Ambulatory Visit (INDEPENDENT_AMBULATORY_CARE_PROVIDER_SITE_OTHER)
Admission: RE | Admit: 2015-02-01 | Discharge: 2015-02-01 | Disposition: A | Discharge status: Home / Self Care | Payer: Commercial Managed Care - PPO | Source: Ambulatory Visit | Attending: Primary Care | Admitting: Primary Care

## 2015-02-01 ENCOUNTER — Encounter: Payer: Self-pay | Admitting: Primary Care

## 2015-02-01 ENCOUNTER — Ambulatory Visit (INDEPENDENT_AMBULATORY_CARE_PROVIDER_SITE_OTHER): Payer: Commercial Managed Care - PPO | Admitting: Primary Care

## 2015-02-01 VITALS — BP 146/86 | HR 73 | Temp 97.6°F | Wt 230.0 lb

## 2015-02-01 DIAGNOSIS — R05 Cough: Secondary | ICD-10-CM | POA: Diagnosis not present

## 2015-02-01 DIAGNOSIS — R059 Cough, unspecified: Secondary | ICD-10-CM

## 2015-02-01 LAB — BASIC METABOLIC PANEL
BUN: 13 mg/dL (ref 6–23)
CO2: 29 mEq/L (ref 19–32)
CREATININE: 1.16 mg/dL (ref 0.40–1.50)
Calcium: 9.4 mg/dL (ref 8.4–10.5)
Chloride: 104 mEq/L (ref 96–112)
GFR: 69.23 mL/min (ref >60.00)
Glucose, Bld: 98 mg/dL (ref 70–99)
Potassium: 4.5 mEq/L (ref 3.5–5.1)
SODIUM: 139 meq/L (ref 135–145)

## 2015-02-01 LAB — CBC WITH DIFFERENTIAL/PLATELET
Basophils Absolute: 0.1 10*3/uL (ref 0.0–0.1)
Basophils Relative: 0.7 % (ref 0.0–3.0)
EOS PCT: 1.2 % (ref 0.0–5.0)
Eosinophils Absolute: 0.1 10*3/uL (ref 0.0–0.7)
HCT: 43.8 % (ref 39.0–52.0)
Hemoglobin: 14.6 g/dL (ref 13.0–17.0)
LYMPHS ABS: 1.9 10*3/uL (ref 0.7–4.0)
Lymphocytes Relative: 22.4 % (ref 12.0–46.0)
MCHC: 33.4 g/dL (ref 30.0–36.0)
MCV: 90.3 fl (ref 78.0–100.0)
MONOS PCT: 10 % (ref 3.0–12.0)
Monocytes Absolute: 0.8 10*3/uL (ref 0.1–1.0)
NEUTROS PCT: 65.7 % (ref 43.0–77.0)
Neutro Abs: 5.6 10*3/uL (ref 1.4–7.7)
Platelets: 256 10*3/uL (ref 150.0–400.0)
RBC: 4.85 Mil/uL (ref 4.22–5.81)
RDW: 14.3 % (ref 11.5–15.5)
WBC: 8.5 10*3/uL (ref 4.0–10.5)

## 2015-02-01 LAB — BRAIN NATRIURETIC PEPTIDE: Pro B Natriuretic peptide (BNP): 98 pg/mL (ref 0.0–100.0)

## 2015-02-01 MED ORDER — HYDROCODONE-HOMATROPINE 5-1.5 MG/5ML PO SYRP: 5.0000 mL | ORAL_SOLUTION | Freq: Three times a day (TID) | ORAL | Status: DC | PRN

## 2015-02-01 NOTE — Patient Instructions (Signed)
Complete lab work prior to leaving today. I will notify you of your results.  Complete xray(s) prior to leaving today. I will contact you regarding your results.  You may take Hycodan every 8 hours as needed for cough.  I will be in touch with you later today.  It was a pleasure meeting you!

## 2015-02-01 NOTE — Progress Notes (Signed)
Subjective:    Patient ID: Dylan Fletcher, male    DOB: [birth year], 54 y.o.   MRN: [id number]  HPI  Dylan Fletcher is a 54 year old male with a chief complaint of cough, weight gain, and fatigue. He has a history of hypertropic obstructive cardiomyopathy, HTN, atrial fibrillation, and obesity. He is managed on lasix 20 mg tablets and coumadin. His cough has been present for 2 weeks with worsening cough yesterday morning. He reports some production to his cough, but has not been able to cough it up. His weight gain (228 on Friday, Sunday 232, Monday 231) was first noticed last Friday, denies SOB, edema to his lower extremities. He's been taking cough drops without relief. He is managed by cardiology in [place] but has been unable to get in touch with the office regarding his current symptoms.   Wt Readings from Last 3 Encounters:  02/01/15 230 lb (104.327 kg)  12/18/14 231 lb (104.781 kg)  12/02/14 233 lb (105.688 kg)     Review of Systems  Constitutional: Positive for appetite change. Negative for fever and chills.  HENT: Negative for congestion and sore throat.   Respiratory: Positive for cough. Negative for shortness of breath.   Cardiovascular: Negative for chest pain.  Gastrointestinal: Negative for nausea and vomiting.       Past Medical History  Diagnosis Date  . History of asthma     as child  . Anxiety associated with depression     sees psych in [place], [place] (meds from there)  . Allergic rhinitis     Ragweed, mold, mildew  . Arrhythmia   . HTN (hypertension)   . HLD (hyperlipidemia)   . Migraine with aura   . Colon polyp 04/2009    rectal tubular adenoma, rec rpt 3 yrs  . OSA (obstructive sleep apnea)     CPAP at night, up to 16 mmHg  . Hypertrophic obstructive cardiomyopathy(425.11)     Cards (Dr. Jodell [facility] - need abx prophylaxis - now established with [facility] Dr. Rosetta Posner [phone]) and Dr. Shearon Stalls and Octavia Heir s/p myomectomy, now  cardiac rehab [facility] 09/2013  . Vitamin D deficiency   . ADD (attention deficit disorder)     sees psych in [place], [place] (meds from there)  . Obesity     saw nutritionist 12/18/2014  . ED (erectile dysfunction)   . Paroxysmal atrial fibrillation 04/2013, 05/2013    with RVR; s/p multiple hospitalizations, spontaneous conversion, referred to Dr. Manuella Ghazi EP, then recurrence - failed sotalol, multaq (both with spont conversions)  . Claustrophobia   . Warfarin anticoagulation     goal INR 2.5-3.5  . H/O mitral valve replacement 08/2013    St Jude, needs abx ppx, completed cardiac rehab 12/2013  . CVA (cerebral infarction) 08/2013    L thalamic lacunar infarct post CVTS surgery, s/p neuropsychology testing, completed speech therapy. no ASA 2/2 no CAD hx  . Need for prophylactic antibiotic   . Transient alteration of awareness 05/2014    during hospitalization pending EEG [facility])  . LBBB (left bundle branch block) 08/2013    after myomectomy    Social History   Social History  . Marital Status: Married    Spouse Name: N/A  . Number of Children: N/A  . Years of Education: N/A   Occupational History  . Not on file.   Social History Main Topics  . Smoking status: Never Smoker   . Smokeless tobacco: Never Used  .  Alcohol Use: Yes     Comment: 1-2 drinks/weekly  . Drug Use: No  . Sexual Activity: Not on file   Other Topics Concern  . Not on file   Social History Narrative   Caffeine: quart of soda/day   Lives with wife [name]), 3 cats. No children.   Occupation: Nurse, children's   Edu: Bachelor's degree   Activity: walking several times a week   Diet: good water, fruits/vegetables daily       Cards: Dr Jodell Cipro [facility] (HOCM) and Dr Manuella Ghazi (EP), as well as [facility] Dr Shearon Stalls    Past Surgical History  Procedure Laterality Date  . Eye surgery  1969; 1971  . Wisdom tooth extraction  1977  . Inguinal hernia repair  1987  . Vasectomy  1992  . Knee arthroscopy  2006; 2007     right  . Colonoscopy  04/2009    rectal tubular adenoma x1, rpt 3 yrs  . Cardiac catheterization  2014    done for chest pain, no plaque buildup  . Pfts  07/2013    FVC 79%, FEV1 72%, ratio 0.71 - mild obstruction, o/w normal  . Myomectomy  08/22/2013    with MAZE procedure [facility] (Drs [place])  . Mitral valve replacement  08/2013    St Jude MV for severe central mitral regurg  . Hospitalization  04/2014    subtherapeutic INR, heparin bridge  . Colonoscopy  04/2014    mild diverticulosis, no polyps, rec rpt 5 yrs ([facility])    Family History  Problem Relation Age of Onset  . Alcohol abuse Brother   . Cancer Mother 17    breast  . Psoriasis Father   . Ulcers Mother   . Heart disease Mother     HOCM  . Cancer Maternal Grandfather     colon  . Coronary artery disease Paternal Grandfather 59    MI  . Diabetes Neg Hx   . Stroke Neg Hx     Allergies  Allergen Reactions  . Nicardipine Hcl Shortness Of Breath    Shortness of breath  . Nitroglycerin Anaphylaxis  . Calcium Channel Blockers Other (See Comments)    Shortness of breath    Current Outpatient Prescriptions on File Prior to Visit  Medication Sig Dispense Refill  . acetaminophen (TYLENOL) 325 MG tablet Take 650 mg by mouth as needed.    Marland Kitchen amoxicillin (AMOXIL) 500 MG tablet Take 500 mg by mouth as directed. 1 BID; start 24 hours prior to dental procedure and 24 hours post dental procedure    . amphetamine-dextroamphetamine (ADDERALL) 30 MG tablet Take 30 mg by mouth as directed. 30 mg QAM; 30 mg lunchtime; 15 mg late afternoon    . buPROPion (WELLBUTRIN XL) 150 MG 24 hr tablet Take 150 mg by mouth daily.    . Cholecalciferol (VITAMIN D) 2000 UNITS CAPS Take 1 capsule by mouth daily.    Marland Kitchen donepezil (ARICEPT) 5 MG tablet 10 mg.   1  . escitalopram (LEXAPRO) 10 MG tablet Take 10 mg by mouth daily.    . furosemide (LASIX) 20 MG tablet Take 20 mg by mouth daily.    . Glucosamine Sulfate 500 MG CAPS Take 1  capsule by mouth 3 (three) times daily.    . metoprolol succinate (TOPROL-XL) 25 MG 24 hr tablet Take 1 tablet (25 mg total) by mouth daily.    . Multiple Vitamin (MULTIVITAMIN) tablet Take 1 tablet by mouth daily.    Marland Kitchen  oxyCODONE-acetaminophen (PERCOCET/ROXICET) 5-325 MG per tablet Take 1 tablet by mouth every 4 (four) hours as needed for severe pain.    . sildenafil (VIAGRA) 100 MG tablet Take 1 tablet (100 mg total) by mouth daily as needed for erectile dysfunction. 10 tablet 3  . warfarin (COUMADIN) 7.5 MG tablet Take 7.5 mg by mouth as directed.      No current facility-administered medications on file prior to visit.    BP 146/86 mmHg  Pulse 73  Temp(Src) 97.6 F (36.4 C) (Oral)  Wt 230 lb (104.327 kg)  SpO2 98%    Objective:   Physical Exam  Constitutional: He is oriented to person, place, and time. He appears well-nourished. He appears ill.  Cardiovascular: Normal rate and regular rhythm.   No edema to lower extremities.  Pulmonary/Chest: Effort normal. He has no decreased breath sounds. He has rhonchi in the right upper field, the right lower field, the left middle field and the left lower field.  Neurological: He is alert and oriented to person, place, and time.  Skin: Skin is warm and dry.  Psychiatric: He has a normal mood and affect.          Assessment & Plan:  Bronchitis or pneumonia vs. Fluid overload:  Suspect bronchitis or pneumonia based on presentation, exam, and lack of fluid retention to extremities. Weight gain has been fairly stable. No crackles noted during exam. Will do chest xray, CBC, BMP, BNP for further evaluation. RX for hycodan to help with cough and provide rest. Vitals stable in office today.  Will update patient and family once results received.

## 2015-02-01 NOTE — Progress Notes (Signed)
Pre visit review using our clinic review tool, if applicable. No additional management support is needed unless otherwise documented below in the visit note. 

## 2015-02-02 ENCOUNTER — Telehealth: Payer: Self-pay | Admitting: Primary Care

## 2015-02-02 NOTE — Telephone Encounter (Signed)
Spoke with wife who reports patient is feeling better. She contacted his cardiologist's office who doubled his lasix, wife reports he's lost 2 pounds since 10am. She will update [place] with any changes.

## 2015-02-02 NOTE — Telephone Encounter (Signed)
Patient was seen yesterday.  Patient is wheezing and if he's not on the CPAP, he's coughing all the time.  Patient is worse today than yesterday.  Please call patient's wife to let her know what to do.

## 2015-02-03 DIAGNOSIS — I5030 Unspecified diastolic (congestive) heart failure: Secondary | ICD-10-CM | POA: Insufficient documentation

## 2015-02-03 DIAGNOSIS — I5033 Acute on chronic diastolic (congestive) heart failure: Secondary | ICD-10-CM | POA: Insufficient documentation

## 2015-02-11 ENCOUNTER — Telehealth: Payer: Self-pay

## 2015-02-11 NOTE — Telephone Encounter (Signed)
Mrs Dylan Fletcher said pt was recently in hospital and [facility] cardiologist changed lasix dosage and wanted pt to have full blood workup to ck K. Advised since not in [facility] network may want to ck with cardiologist to get printed lab order and go to [facility] [place]. Pts wife said has never done this before and so I asked her to get specific testing needed and could cb and I would send note to Dr Darnell Level. Mrs Dylan Fletcher voiced understanding.

## 2015-02-18 LAB — COMPREHENSIVE METABOLIC PANEL
BUN: 16 mg/dL (ref 4–21)
CREATININE: 1.17
Calcium: 9 mg/dL
Glucose: 91
Potassium: 4.3 mmol/L
SODIUM: 139

## 2015-02-24 ENCOUNTER — Telehealth: Payer: Self-pay | Admitting: Family Medicine

## 2015-02-24 ENCOUNTER — Telehealth: Payer: Self-pay

## 2015-02-24 NOTE — Telephone Encounter (Signed)
Pt left v/m; pt scheduled for CPX on 03/19/2015. Pt needs to have lab testing done for work and wants to get CPX labs done at work at the same time. Pt request order for CPX lab test. Pt request cb.

## 2015-02-24 NOTE — Telephone Encounter (Signed)
PLEASE NOTE: All timestamps contained within this report are represented as [place] Standard Time. CONFIDENTIALTY NOTICE: This fax transmission is intended only for the addressee. It contains information that is legally privileged, confidential or otherwise protected from use or disclosure. If you are not the intended recipient, you are strictly prohibited from reviewing, disclosing, copying using or disseminating any of this information or taking any action in reliance on or regarding this information. If you have received this fax in error, please notify us immediately by telephone so that we can arrange for its return to [place]. Phone: [phone], Toll-Free: [phone], Fax: [phone] Page: 1 of 1 Call Id: [id number] Forbes Patient Name: Dylan Fletcher DOB: 1961-03-13 Initial Comment Caller states he has a clogged ear/earache. Nurse Assessment Nurse: Ronnald Ramp, RN, Miranda Date/Time (Eastern Time): 02/24/2015 12:57:54 PM Confirm and document reason for call. If symptomatic, describe symptoms. ---Caller states he has right ear congestion for 24-36 hrs. Has the patient traveled out of the country within the last 30 days? ---Not Applicable Does the patient require triage? ---Yes Related visit to physician within the last 2 weeks? ---No Does the PT have any chronic conditions? (i.e. diabetes, asthma, etc.) ---Yes List chronic conditions. ---heart valve replacement Guidelines Guideline Title Affirmed Question Affirmed Notes Ear - Congestion Ear congestion (all triage questions negative) Final Disposition User Home Care Ronnald Ramp, RN, Miranda Comments He wanted to know if there are any interactions with medications and Coumadin. Told caller no interactions noted with Claritin, Zyrtec, Sudafed, or Afrin. Disagree/Comply: Comply

## 2015-02-25 NOTE — Telephone Encounter (Addendum)
Ordered and in Kim's box. CMP, FLP, PSA, vit D

## 2015-02-25 NOTE — Telephone Encounter (Signed)
Message left notifying patient and order placed up front for pick up.

## 2015-02-26 ENCOUNTER — Ambulatory Visit (INDEPENDENT_AMBULATORY_CARE_PROVIDER_SITE_OTHER): Payer: Commercial Managed Care - PPO | Admitting: Internal Medicine

## 2015-02-26 ENCOUNTER — Encounter: Payer: Self-pay | Admitting: Internal Medicine

## 2015-02-26 VITALS — BP 132/80 | HR 99 | Temp 97.9°F | Wt 227.0 lb

## 2015-02-26 DIAGNOSIS — H6983 Other specified disorders of Eustachian tube, bilateral: Secondary | ICD-10-CM | POA: Diagnosis not present

## 2015-02-26 NOTE — Progress Notes (Signed)
Pre visit review using our clinic review tool, if applicable. No additional management support is needed unless otherwise documented below in the visit note. 

## 2015-02-26 NOTE — Patient Instructions (Signed)
Take Flonase twice daily for the next 3 days Call me on Monday to let me know if things do not improve and we can try Prednisone

## 2015-02-26 NOTE — Progress Notes (Signed)
Subjective:    Patient ID: Dylan Fletcher, male    DOB: [birth year], 54 y.o.   MRN: [id number]  HPI  Pt presents to the clinic today with c/o bilateral ear fullness. This started 3 days ago. The right seems worse than the left. He feels like they are clogged. He has noticed some difficulty hearing. He denies runny nose, nasal congestion, sore throat or cough. He has had some associated mild dizziness but only with position changes. He denies chest pain or shortness of breath. He denies fever, chills or body aches. He has tried Sudafed, Zyrtec and Afrin without any relief. He does have a history of seasonal allergies. He has not had sick contacts that he is aware of.  Review of Systems      Past Medical History  Diagnosis Date  . History of asthma     as child  . Anxiety associated with depression     sees psych in [place], [place] (meds from there)  . Allergic rhinitis     Ragweed, mold, mildew  . Arrhythmia   . HTN (hypertension)   . HLD (hyperlipidemia)   . Migraine with aura   . Colon polyp 04/2009    rectal tubular adenoma, rec rpt 3 yrs  . OSA (obstructive sleep apnea)     CPAP at night, up to 16 mmHg  . Hypertrophic obstructive cardiomyopathy(425.11)     Cards (Dr. Jodell Cipro [facility] - need abx prophylaxis - now established with [facility] Dr. Rosetta Posner [phone]) and Dr. Shearon Stalls and Octavia Heir s/p myomectomy, now cardiac rehab [facility] 09/2013  . Vitamin D deficiency   . ADD (attention deficit disorder)     sees psych in [place], [place] (meds from there)  . Obesity     saw nutritionist 12/18/2014  . ED (erectile dysfunction)   . Paroxysmal atrial fibrillation 04/2013, 05/2013    with RVR; s/p multiple hospitalizations, spontaneous conversion, referred to Dr. Manuella Ghazi EP, then recurrence - failed sotalol, multaq (both with spont conversions)  . Claustrophobia   . Warfarin anticoagulation     goal INR 2.5-3.5  . H/O mitral valve replacement 08/2013    St Jude, needs abx  ppx, completed cardiac rehab 12/2013  . CVA (cerebral infarction) 08/2013    L thalamic lacunar infarct post CVTS surgery, s/p neuropsychology testing, completed speech therapy. no ASA 2/2 no CAD hx  . Need for prophylactic antibiotic   . Transient alteration of awareness 05/2014    during hospitalization pending EEG [facility])  . LBBB (left bundle branch block) 08/2013    after myomectomy    Current Outpatient Prescriptions  Medication Sig Dispense Refill  . acetaminophen (TYLENOL) 325 MG tablet Take 650 mg by mouth as needed.    Marland Kitchen amoxicillin (AMOXIL) 500 MG tablet Take 500 mg by mouth as directed. 1 BID; start 24 hours prior to dental procedure and 24 hours post dental procedure    . amphetamine-dextroamphetamine (ADDERALL) 30 MG tablet Take 30 mg by mouth as directed. 30 mg QAM; 30 mg lunchtime; 15 mg late afternoon    . buPROPion (WELLBUTRIN XL) 150 MG 24 hr tablet Take 150 mg by mouth daily.    . Cholecalciferol (VITAMIN D) 2000 UNITS CAPS Take 1 capsule by mouth daily.    Marland Kitchen donepezil (ARICEPT) 5 MG tablet 10 mg.   1  . escitalopram (LEXAPRO) 10 MG tablet Take 10 mg by mouth daily.    . furosemide (LASIX) 20 MG tablet Take 20  mg by mouth daily.    . Glucosamine Sulfate 500 MG CAPS Take 1 capsule by mouth 3 (three) times daily.    . metoprolol succinate (TOPROL-XL) 25 MG 24 hr tablet Take 1 tablet (25 mg total) by mouth daily.    . Multiple Vitamin (MULTIVITAMIN) tablet Take 1 tablet by mouth daily.    Marland Kitchen oxyCODONE-acetaminophen (PERCOCET/ROXICET) 5-325 MG per tablet Take 1 tablet by mouth every 4 (four) hours as needed for severe pain.    . sildenafil (VIAGRA) 100 MG tablet Take 1 tablet (100 mg total) by mouth daily as needed for erectile dysfunction. 10 tablet 3  . warfarin (COUMADIN) 7.5 MG tablet Take 7.5 mg by mouth as directed.     Marland Kitchen HYDROcodone-homatropine (HYCODAN) 5-1.5 MG/5ML syrup Take 5 mLs by mouth every 8 (eight) hours as needed for cough. (Patient not taking:  Reported on 02/26/2015) 150 mL 0   No current facility-administered medications for this visit.    Allergies  Allergen Reactions  . Nicardipine Hcl Shortness Of Breath    Shortness of breath  . Nitroglycerin Anaphylaxis  . Calcium Channel Blockers Other (See Comments)    Shortness of breath    Family History  Problem Relation Age of Onset  . Alcohol abuse Brother   . Cancer Mother 85    breast  . Psoriasis Father   . Ulcers Mother   . Heart disease Mother     HOCM  . Cancer Maternal Grandfather     colon  . Coronary artery disease Paternal Grandfather 69    MI  . Diabetes Neg Hx   . Stroke Neg Hx     Social History   Social History  . Marital Status: Married    Spouse Name: N/A  . Number of Children: N/A  . Years of Education: N/A   Occupational History  . Not on file.   Social History Main Topics  . Smoking status: Never Smoker   . Smokeless tobacco: Never Used  . Alcohol Use: Yes     Comment: 1-2 drinks/weekly  . Drug Use: No  . Sexual Activity: Not on file   Other Topics Concern  . Not on file   Social History Narrative   Caffeine: quart of soda/day   Lives with wife [name]), 3 cats. No children.   Occupation: Nurse, children's   Edu: Bachelor's degree   Activity: walking several times a week   Diet: good water, fruits/vegetables daily       Cards: Dr Jodell Cipro [facility] (HOCM) and Dr Manuella Ghazi (EP), as well as [facility] Dr Shearon Stalls     Constitutional: Denies fever, malaise, fatigue, headache or abrupt weight changes.  HEENT: Denies eye pain, eye redness, ear pain, ringing in the ears, wax buildup, runny nose, nasal congestion, bloody nose, or sore throat. Respiratory: Denies difficulty breathing, shortness of breath, cough or sputum production.   Cardiovascular: Denies chest pain, chest tightness, palpitations or swelling in the hands or feet.  Neurological: Pt reports dizziness. Denies difficulty with memory, difficulty with speech or problems with balance  and coordination.    No other specific complaints in a complete review of systems (except as listed in HPI above).  Objective:   Physical Exam  BP 132/80 mmHg  Pulse 99  Temp(Src) 97.9 F (36.6 C) (Oral)  Wt 227 lb (102.967 kg)  SpO2 97% Wt Readings from Last 3 Encounters:  02/26/15 227 lb (102.967 kg)  02/01/15 230 lb (104.327 kg)  12/18/14 231 lb (104.781 kg)  General: Appears his  stated age, obese in NAD. Skin: Warm, dry and intact. No rashes, lesions or ulcerations noted. HEENT: Head: normal shape and size, no sinus tenderness noted; Eyes: sclera white, no icterus, conjunctiva pink; Ears: Tm's pink but intact, normal light reflex; Nose: mucosa boggy and moist, septum midline; Throat/Mouth: Teeth present, mucosa pink and moist, no exudate, lesions or ulcerations noted.  Neck:  No adenopathy noted. Cardiovascular: Normal rate and rhythm. S1,S2 noted.  Click noted. Pulmonary/Chest: Normal effort and positive vesicular breath sounds. No respiratory distress. No wheezes, rales or ronchi noted.  Neurological: Alert and oriented. He does have some noted memory impairment (he attributes this to his stroke)  BMET    Component Value Date/Time   NA 139 02/01/2015 1147   NA 139 01/22/2012   K 4.5 02/01/2015 1147   K 4.2 01/28/2013   CL 104 02/01/2015 1147   CO2 29 02/01/2015 1147   GLUCOSE 98 02/01/2015 1147   BUN 13 02/01/2015 1147   BUN 15 01/28/2013   CREATININE 1.16 02/01/2015 1147   CREATININE 1.04 01/28/2013   CALCIUM 9.4 02/01/2015 1147   CALCIUM 9.7 01/22/2012    Lipid Panel     Component Value Date/Time   CHOL 252* 03/12/2014 1017   TRIG 153.0* 03/12/2014 1017   TRIG 160 01/28/2013   HDL 40.90 03/12/2014 1017   CHOLHDL 6 03/12/2014 1017   VLDL 30.6 03/12/2014 1017   LDLCALC 181* 03/12/2014 1017   LDLCALC 131 01/28/2013    CBC    Component Value Date/Time   WBC 8.5 02/01/2015 1147   WBC 12.1* 12/15/2013 1752   RBC 4.85 02/01/2015 1147   RBC 5.45  12/15/2013 1752   HGB 14.6 02/01/2015 1147   HGB 14.3 12/15/2013 1752   HGB 15.3 01/28/2013   HCT 43.8 02/01/2015 1147   HCT 45.1 12/15/2013 1752   PLT 256.0 02/01/2015 1147   PLT 302 12/15/2013 1752   MCV 90.3 02/01/2015 1147   MCV 83 12/15/2013 1752   MCH 26.2 12/15/2013 1752   MCHC 33.4 02/01/2015 1147   MCHC 31.6* 12/15/2013 1752   RDW 14.3 02/01/2015 1147   RDW 19.8* 12/15/2013 1752   LYMPHSABS 1.9 02/01/2015 1147   MONOABS 0.8 02/01/2015 1147   EOSABS 0.1 02/01/2015 1147   BASOSABS 0.1 02/01/2015 1147    Hgb A1C No results found for: HGBA1C       Assessment & Plan:   ETD bilateral:  Stop Sudafed and Afrin Continue Zyrtec Add Flonase BID x 3 days If no improvement, consider Prednisone  RTC as needed or if symptoms persist or worsens

## 2015-03-01 ENCOUNTER — Encounter: Payer: Self-pay | Admitting: Internal Medicine

## 2015-03-03 ENCOUNTER — Encounter: Payer: Self-pay | Admitting: *Deleted

## 2015-03-12 ENCOUNTER — Encounter: Payer: Self-pay | Admitting: Family Medicine

## 2015-03-15 ENCOUNTER — Emergency Department
Admission: EM | Admit: 2015-03-15 | Discharge: 2015-03-15 | Disposition: A | Discharge status: Home / Self Care | Payer: Commercial Managed Care - PPO

## 2015-03-15 ENCOUNTER — Other Ambulatory Visit: Payer: Commercial Managed Care - PPO

## 2015-03-15 ENCOUNTER — Telehealth: Payer: Self-pay | Admitting: Family Medicine

## 2015-03-15 LAB — COMPREHENSIVE METABOLIC PANEL
ALT: 29
AST: 31 U/L
Alkaline Phosphatase: 73 U/L
Creat: 1.14
Glucose: 100
Potassium: 4.5 mmol/L
Sodium: 143
Total Bilirubin: 0.6 mg/dL

## 2015-03-15 LAB — CBC
HGB: 15.9 g/dL
WBC: 8.2
platelet count: 325

## 2015-03-15 LAB — HEMOGLOBIN A1C: A1c: 5.7

## 2015-03-15 LAB — PSA: PSA: 1

## 2015-03-15 LAB — VITAMIN D 25 HYDROXY (VIT D DEFICIENCY, FRACTURES): Vit D, 25-Hydroxy: 31.6

## 2015-03-15 LAB — TSH: TSH: 0.803

## 2015-03-15 NOTE — Telephone Encounter (Signed)
Spouse called he is getting his cpx labs done at work  He needs order.

## 2015-03-15 NOTE — Telephone Encounter (Signed)
Spoke with patient. Based on what was drawn last year, I told him what he would need. He didn't need a "physical order", he just wanted to know what to tell them to draw.

## 2015-03-16 ENCOUNTER — Telehealth: Payer: Self-pay | Admitting: Emergency Medicine

## 2015-03-16 NOTE — ED Notes (Signed)
Called patient due to lwot to inquire about condition and follow up plans. Patient says he went to [place] [facility] and is being treated.

## 2015-03-17 ENCOUNTER — Encounter: Payer: Commercial Managed Care - PPO | Admitting: Family Medicine

## 2015-03-18 LAB — LIPID PANEL
Cholesterol: 253
HDL Cholesterol: 37
LDL (calc): 149
Triglycerides: 336

## 2015-03-19 ENCOUNTER — Ambulatory Visit (INDEPENDENT_AMBULATORY_CARE_PROVIDER_SITE_OTHER): Payer: Commercial Managed Care - PPO | Admitting: Family Medicine

## 2015-03-19 ENCOUNTER — Encounter: Payer: Self-pay | Admitting: Family Medicine

## 2015-03-19 VITALS — BP 130/80 | HR 64 | Temp 97.5°F | Ht 66.0 in | Wt 231.5 lb

## 2015-03-19 DIAGNOSIS — IMO0001 Reserved for inherently not codable concepts without codable children: Secondary | ICD-10-CM

## 2015-03-19 DIAGNOSIS — Z7901 Long term (current) use of anticoagulants: Secondary | ICD-10-CM

## 2015-03-19 DIAGNOSIS — I10 Essential (primary) hypertension: Secondary | ICD-10-CM

## 2015-03-19 DIAGNOSIS — Z0000 Encounter for general adult medical examination without abnormal findings: Secondary | ICD-10-CM | POA: Diagnosis not present

## 2015-03-19 DIAGNOSIS — E559 Vitamin D deficiency, unspecified: Secondary | ICD-10-CM

## 2015-03-19 DIAGNOSIS — Z23 Encounter for immunization: Secondary | ICD-10-CM

## 2015-03-19 DIAGNOSIS — H43812 Vitreous degeneration, left eye: Secondary | ICD-10-CM | POA: Insufficient documentation

## 2015-03-19 DIAGNOSIS — H6591 Unspecified nonsuppurative otitis media, right ear: Secondary | ICD-10-CM

## 2015-03-19 DIAGNOSIS — E785 Hyperlipidemia, unspecified: Secondary | ICD-10-CM

## 2015-03-19 NOTE — Progress Notes (Signed)
BP 130/80 mmHg  Pulse 64  Temp(Src) 97.5 F (36.4 C) (Oral)  Ht 5\' 6"  (1.676 m)  Wt 231 lb 8 oz (105.008 kg)  BMI 37.38 kg/m2   CC: CPE  Subjective:    Patient ID: Dylan Fletcher, male    DOB: 19-Mar-1961, 54 y.o.   MRN: [id number]  HPI: Cartez Mogle is a 54 y.o. male presenting on 03/19/2015 for Annual Exam   Had labs done at work. 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  Recent posterior vitreous detachment on Monday. Seeing Dr Lamonte Sakai.   Recent ear congestion as well. Treating with zyrtec. Has stopped flonsae.   Pleasant 54 yo s/p myomectomy and biatrial MAZE procedure for his HOCM with symptomatic atrial fibrillation at [facility] 08/2013. Surgery complicated by severe central mitral regurgitation leading to MVR with St Jude valve a few days later. On chronic anticoagulation with coumadin. Hospitalization also complicated by C diff infection s/p oral vancomycin course and mild CVA with residual aphasia postop. Had transient alteration of awareness s/p hospitalization 05/2014. Then hospitalized 01/2015 with acute on chronic diastolic CHF and cough - cough thought viral PNA related, treated with doxycycline course. Echo from 10/03/13 showing EF 60%, normally functioning prosthetic mitral valve, abnormal filling pressure.   Preventative: COLONOSCOPY Date: 04/2014 mild diverticulosis, no polyps, rec rpt 5 yrs [facility]) Prostate cancer screening - requests continued screening. Nocturia x1. Slight weakening of stream. Tdap 01/2013 Flu today Pneumovax today. Seat belt use discussed Sunscreen use discussed. No changing moles on skin.  Caffeine: cutting back - 12-14 oz soda/day  Lives with wife, 3 cats  Occupation: Nurse, children's  Edu: Bachelor's degree  Activity: walking several times a week  Diet: good water, fruits/vegetables daily   Relevant past medical, surgical, family and social history reviewed and updated as indicated. Interim medical history since our last visit reviewed. Allergies  and medications reviewed and updated. Current Outpatient Prescriptions on File Prior to Visit  Medication Sig  . acetaminophen (TYLENOL) 325 MG tablet Take 650 mg by mouth as needed.  Marland Kitchen amoxicillin (AMOXIL) 500 MG tablet Take 500 mg by mouth as directed. 1 BID; start 24 hours prior to dental procedure and 24 hours post dental procedure  . amphetamine-dextroamphetamine (ADDERALL) 30 MG tablet Take 30 mg by mouth as directed. 30 mg QAM; 30 mg lunchtime; 15 mg late afternoon  . buPROPion (WELLBUTRIN XL) 150 MG 24 hr tablet Take 150 mg by mouth daily.  . Cholecalciferol (VITAMIN D) 2000 UNITS CAPS Take 1 capsule by mouth daily.  Marland Kitchen escitalopram (LEXAPRO) 10 MG tablet Take 10 mg by mouth daily.  . Glucosamine Sulfate 500 MG CAPS Take 1 capsule by mouth 3 (three) times daily.  . metoprolol succinate (TOPROL-XL) 25 MG 24 hr tablet Take 1 tablet (25 mg total) by mouth daily.  . Multiple Vitamin (MULTIVITAMIN) tablet Take 1 tablet by mouth daily.  Marland Kitchen oxyCODONE-acetaminophen (PERCOCET/ROXICET) 5-325 MG per tablet Take 1 tablet by mouth every 4 (four) hours as needed for severe pain.  . sildenafil (VIAGRA) 100 MG tablet Take 1 tablet (100 mg total) by mouth daily as needed for erectile dysfunction.  Marland Kitchen warfarin (COUMADIN) 7.5 MG tablet Take 7.5 mg by mouth as directed.    No current facility-administered medications on file prior to visit.    Review of Systems  Constitutional: Negative for fever, chills, activity change, appetite change, fatigue and unexpected weight change.  HENT: Negative for hearing loss.   Eyes: Positive for visual disturbance (see HPI).  Respiratory:  Negative for cough, chest tightness, shortness of breath and wheezing.   Cardiovascular: Negative for chest pain, palpitations and leg swelling.  Gastrointestinal: Negative for nausea, vomiting, abdominal pain, diarrhea, constipation, blood in stool and abdominal distention.  Genitourinary: Negative for hematuria and difficulty  urinating.  Musculoskeletal: Negative for myalgias, arthralgias and neck pain.  Skin: Negative for rash.  Neurological: Negative for dizziness, seizures, syncope and headaches.  Hematological: Negative for adenopathy. Bruises/bleeds easily.  Psychiatric/Behavioral: Negative for dysphoric mood. The patient is not nervous/anxious.    Per HPI unless specifically indicated above     Objective:    BP 130/80 mmHg  Pulse 64  Temp(Src) 97.5 F (36.4 C) (Oral)  Ht 5\' 6"  (1.676 m)  Wt 231 lb 8 oz (105.008 kg)  BMI 37.38 kg/m2  Wt Readings from Last 3 Encounters:  03/19/15 231 lb 8 oz (105.008 kg)  02/26/15 227 lb (102.967 kg)  02/01/15 230 lb (104.327 kg)    Physical Exam  Constitutional: He is oriented to person, place, and time. He appears well-developed and well-nourished. No distress.  HENT:  Head: Normocephalic and atraumatic.  Right Ear: Hearing, external ear and ear canal normal.  Left Ear: Hearing, tympanic membrane, external ear and ear canal normal.  Nose: Nose normal.  Mouth/Throat: Uvula is midline, oropharynx is clear and moist and mucous membranes are normal. No oropharyngeal exudate, posterior oropharyngeal edema or posterior oropharyngeal erythema.  Fluid behind R ear  Eyes: Conjunctivae and EOM are normal. Pupils are equal, round, and reactive to light. No scleral icterus.  Neck: Normal range of motion. Neck supple. No thyromegaly present.  Cardiovascular: Normal rate, regular rhythm and intact distal pulses.   Murmur (mild) heard. Pulses:      Radial pulses are 2+ on the right side, and 2+ on the left side.  Pulmonary/Chest: Effort normal and breath sounds normal. No respiratory distress. He has no wheezes. He has no rales.  Abdominal: Soft. Bowel sounds are normal. He exhibits no distension and no mass. There is no tenderness. There is no rebound and no guarding.  obese  Genitourinary: Rectum normal and prostate normal. Rectal exam shows no external hemorrhoid, no  internal hemorrhoid, no fissure, no mass, no tenderness and anal tone normal. Prostate is not enlarged (20gm) and not tender.  Musculoskeletal: Normal range of motion. He exhibits no edema.  Lymphadenopathy:    He has no cervical adenopathy.  Neurological: He is alert and oriented to person, place, and time.  CN grossly intact, station and gait intact  Skin: Skin is warm and dry. No rash noted.  Psychiatric: He has a normal mood and affect. His behavior is normal. Judgment and thought content normal.  Nursing note and vitals reviewed.  Results for orders placed or performed in visit on 03/03/15  Comprehensive metabolic panel  Result Value Ref Range   Sodium 139    Potassium 4.3 mmol/L   BUN 16 4 - 21 mg/dL   Creat 1.17    Glucose 91    Calcium 9.0 mg/dL      Assessment & Plan:  Hep C screen next labwork. Problem List Items Addressed This Visit    Warfarin anticoagulation    Continue anticoagulation.      Vitamin D deficiency    Continue 2000 IU daily.      Right serous otitis media    No pain, fever, no evidence of infection Treat with restarting flonase, continue zyrtec.      PVD (posterior vitreous detachment), left eye  Followed by ophtho - f/u appt scheduled for 3 wks.      Obesity, Class II, BMI 35-39.9, with comorbidity    Discussed healthy diet and increased lifestyle.       HTN (hypertension)    Chronic, stable.       Relevant Medications   furosemide (LASIX) 40 MG tablet   HLD (hyperlipidemia)    Off statin for last year. Reviewed elevated lipid levels. Discussed dietary changes to affect change. Pt endorses clear arteries.       Relevant Medications   furosemide (LASIX) 40 MG tablet   Health maintenance examination - Primary    Preventative protocols reviewed and updated unless pt declined. Discussed healthy diet and lifestyle.           Follow up plan: Return in about 6 months (around 09/16/2015), or as needed, for follow up visit.

## 2015-03-19 NOTE — Assessment & Plan Note (Signed)
Discussed healthy diet and increased lifestyle.

## 2015-03-19 NOTE — Assessment & Plan Note (Signed)
Preventative protocols reviewed and updated unless pt declined. Discussed healthy diet and lifestyle.  

## 2015-03-19 NOTE — Assessment & Plan Note (Signed)
Followed by ophtho - f/u appt scheduled for 3 wks.

## 2015-03-19 NOTE — Patient Instructions (Addendum)
Flu shot today. Pneumovax today.  Start flonase.  Decrease added sugars, eliminate trans fats, increase fiber and limit alcohol.  All these changes together can drop triglycerides by almost 50%.  Hep C screen next lab draw. Return in 6 months for follow up visit, sooner if needed.  Health Maintenance A healthy lifestyle and preventative care can promote health and wellness.  Maintain regular health, dental, and eye exams.  Eat a healthy diet. Foods like vegetables, fruits, whole grains, low-fat dairy products, and lean protein foods contain the nutrients you need and are low in calories. Decrease your intake of foods high in solid fats, added sugars, and salt. Get information about a proper diet from your health care provider, if necessary.  Regular physical exercise is one of the most important things you can do for your health. Most adults should get at least 150 minutes of moderate-intensity exercise (any activity that increases your heart rate and causes you to sweat) each week. In addition, most adults need muscle-strengthening exercises on 2 or more days a week.   Maintain a healthy weight. The body mass index (BMI) is a screening tool to identify possible weight problems. It provides an estimate of body fat based on height and weight. Your health care provider can find your BMI and can help you achieve or maintain a healthy weight. For males 20 years and older:  A BMI below 18.5 is considered underweight.  A BMI of 18.5 to 24.9 is normal.  A BMI of 25 to 29.9 is considered overweight.  A BMI of 30 and above is considered obese.  Maintain normal blood lipids and cholesterol by exercising and minimizing your intake of saturated fat. Eat a balanced diet with plenty of fruits and vegetables. Blood tests for lipids and cholesterol should begin at age 65 and be repeated every 5 years. If your lipid or cholesterol levels are high, you are over age 64, or you are at high risk for heart  disease, you may need your cholesterol levels checked more frequently.Ongoing high lipid and cholesterol levels should be treated with medicines if diet and exercise are not working.  If you smoke, find out from your health care provider how to quit. If you do not use tobacco, do not start.  Lung cancer screening is recommended for adults aged 33-80 years who are at high risk for developing lung cancer because of a history of smoking. A yearly low-dose CT scan of the lungs is recommended for people who have at least a 30-pack-year history of smoking and are current smokers or have quit within the past 15 years. A pack year of smoking is smoking an average of 1 pack of cigarettes a day for 1 year (for example, a 30-pack-year history of smoking could mean smoking 1 pack a day for 30 years or 2 packs a day for 15 years). Yearly screening should continue until the smoker has stopped smoking for at least 15 years. Yearly screening should be stopped for people who develop a health problem that would prevent them from having lung cancer treatment.  If you choose to drink alcohol, do not have more than 2 drinks per day. One drink is considered to be 12 oz (360 mL) of beer, 5 oz (150 mL) of wine, or 1.5 oz (45 mL) of liquor.  Avoid the use of street drugs. Do not share needles with anyone. Ask for help if you need support or instructions about stopping the use of drugs.  High blood  pressure causes heart disease and increases the risk of stroke. Blood pressure should be checked at least every 1-2 years. Ongoing high blood pressure should be treated with medicines if weight loss and exercise are not effective.  If you are 17-29 years old, ask your health care provider if you should take aspirin to prevent heart disease.  Diabetes screening involves taking a blood sample to check your fasting blood sugar level. This should be done once every 3 years after age 45 if you are at a normal weight and without risk  factors for diabetes. Testing should be considered at a younger age or be carried out more frequently if you are overweight and have at least 1 risk factor for diabetes.  Colorectal cancer can be detected and often prevented. Most routine colorectal cancer screening begins at the age of 53 and continues through age 59. However, your health care provider may recommend screening at an earlier age if you have risk factors for colon cancer. On a yearly basis, your health care provider may provide home test kits to check for hidden blood in the stool. A small camera at the end of a tube may be used to directly examine the colon (sigmoidoscopy or colonoscopy) to detect the earliest forms of colorectal cancer. Talk to your health care provider about this at age 24 when routine screening begins. A direct exam of the colon should be repeated every 5-10 years through age 32, unless early forms of precancerous polyps or small growths are found.  People who are at an increased risk for hepatitis B should be screened for this virus. You are considered at high risk for hepatitis B if:  You were born in a country where hepatitis B occurs often. Talk with your health care provider about which countries are considered high risk.  Your parents were born in a high-risk country and you have not received a shot to protect against hepatitis B (hepatitis B vaccine).  You have HIV or AIDS.  You use needles to inject street drugs.  You live with, or have sex with, someone who has hepatitis B.  You are a man who has sex with other men (MSM).  You get hemodialysis treatment.  You take certain medicines for conditions like cancer, organ transplantation, and autoimmune conditions.  Hepatitis C blood testing is recommended for all people born from [birth year] through 1967 and any individual with known risk factors for hepatitis C.  Healthy men should no longer receive prostate-specific antigen (PSA) blood tests as part of  routine cancer screening. Talk to your health care provider about prostate cancer screening.  Testicular cancer screening is not recommended for adolescents or adult males who have no symptoms. Screening includes self-exam, a health care provider exam, and other screening tests. Consult with your health care provider about any symptoms you have or any concerns you have about testicular cancer.  Practice safe sex. Use condoms and avoid high-risk sexual practices to reduce the spread of sexually transmitted infections (STIs).  You should be screened for STIs, including gonorrhea and chlamydia if:  You are sexually active and are younger than 24 years.  You are older than 24 years, and your health care provider tells you that you are at risk for this type of infection.  Your sexual activity has changed since you were last screened, and you are at an increased risk for chlamydia or gonorrhea. Ask your health care provider if you are at risk.  If you are at risk  of being infected with HIV, it is recommended that you take a prescription medicine daily to prevent HIV infection. This is called pre-exposure prophylaxis (PrEP). You are considered at risk if:  You are a man who has sex with other men (MSM).  You are a heterosexual man who is sexually active with multiple partners.  You take drugs by injection.  You are sexually active with a partner who has HIV.  Talk with your health care provider about whether you are at high risk of being infected with HIV. If you choose to begin PrEP, you should first be tested for HIV. You should then be tested every 3 months for as long as you are taking PrEP.  Use sunscreen. Apply sunscreen liberally and repeatedly throughout the day. You should seek shade when your shadow is shorter than you. Protect yourself by wearing long sleeves, pants, a wide-brimmed hat, and sunglasses year round whenever you are outdoors.  Tell your health care provider of new moles  or changes in moles, especially if there is a change in shape or color. Also, tell your health care provider if a mole is larger than the size of a pencil eraser.  A one-time screening for abdominal aortic aneurysm (AAA) and surgical repair of large AAAs by ultrasound is recommended for men aged 34-75 years who are current or former smokers.  Stay current with your vaccines (immunizations). Document Released: 12/02/2007 Document Revised: 06/10/2013 Document Reviewed: 10/31/2010 [facility] Patient Information 2015 [place], [place]. This information is not intended to replace advice given to you by your health care provider. Make sure you discuss any questions you have with your health care provider.

## 2015-03-19 NOTE — Assessment & Plan Note (Signed)
Continue anticoagulation 

## 2015-03-19 NOTE — Assessment & Plan Note (Signed)
Continue 2000 IU daily.  

## 2015-03-19 NOTE — Progress Notes (Signed)
Pre visit review using our clinic review tool, if applicable. No additional management support is needed unless otherwise documented below in the visit note. 

## 2015-03-19 NOTE — Assessment & Plan Note (Signed)
No pain, fever, no evidence of infection Treat with restarting flonase, continue zyrtec.

## 2015-03-19 NOTE — Addendum Note (Signed)
Addended by: Royann Shivers A on: 03/19/2015 09:48 AM   Modules accepted: Orders

## 2015-03-19 NOTE — Assessment & Plan Note (Signed)
Off statin for last year. Reviewed elevated lipid levels. Discussed dietary changes to affect change. Pt endorses clear arteries.

## 2015-03-19 NOTE — Assessment & Plan Note (Signed)
Chronic, stable 

## 2015-03-24 ENCOUNTER — Encounter: Payer: Self-pay | Admitting: *Deleted

## 2015-04-20 HISTORY — PX: EYE SURGERY: SHX253

## 2015-05-28 ENCOUNTER — Encounter: Payer: Self-pay | Admitting: Family Medicine

## 2015-05-29 NOTE — Telephone Encounter (Signed)
plz call to schedule appt this week for evaluation of dizziness/fatigue.

## 2015-06-01 NOTE — Telephone Encounter (Signed)
Appt scheduled

## 2015-06-02 ENCOUNTER — Encounter: Payer: Self-pay | Admitting: Family Medicine

## 2015-06-02 ENCOUNTER — Ambulatory Visit (INDEPENDENT_AMBULATORY_CARE_PROVIDER_SITE_OTHER): Payer: Commercial Managed Care - PPO | Admitting: Family Medicine

## 2015-06-02 VITALS — BP 130/94 | HR 84 | Temp 97.5°F | Wt 228.0 lb

## 2015-06-02 DIAGNOSIS — I421 Obstructive hypertrophic cardiomyopathy: Secondary | ICD-10-CM

## 2015-06-02 DIAGNOSIS — R350 Frequency of micturition: Secondary | ICD-10-CM | POA: Diagnosis not present

## 2015-06-02 DIAGNOSIS — R5382 Chronic fatigue, unspecified: Secondary | ICD-10-CM

## 2015-06-02 DIAGNOSIS — G4733 Obstructive sleep apnea (adult) (pediatric): Secondary | ICD-10-CM

## 2015-06-02 DIAGNOSIS — R42 Dizziness and giddiness: Secondary | ICD-10-CM

## 2015-06-02 DIAGNOSIS — H8102 Meniere's disease, left ear: Secondary | ICD-10-CM | POA: Insufficient documentation

## 2015-06-02 DIAGNOSIS — Z1159 Encounter for screening for other viral diseases: Secondary | ICD-10-CM

## 2015-06-02 LAB — VITAMIN B12: VITAMIN B 12: 610 pg/mL (ref 211–911)

## 2015-06-02 LAB — POCT URINALYSIS DIPSTICK
Bilirubin, UA: NEGATIVE
Blood, UA: NEGATIVE
Glucose, UA: NEGATIVE
Ketones, UA: NEGATIVE
LEUKOCYTES UA: NEGATIVE
NITRITE UA: NEGATIVE
PH UA: 6
PROTEIN UA: NEGATIVE
Spec Grav, UA: 1.025
Urobilinogen, UA: 0.2

## 2015-06-02 LAB — CBC WITH DIFFERENTIAL/PLATELET
BASOS ABS: 0.1 10*3/uL (ref 0.0–0.1)
BASOS PCT: 0.5 % (ref 0.0–3.0)
EOS ABS: 0.1 10*3/uL (ref 0.0–0.7)
Eosinophils Relative: 0.7 % (ref 0.0–5.0)
HEMATOCRIT: 49.3 % (ref 39.0–52.0)
HEMOGLOBIN: 16.3 g/dL (ref 13.0–17.0)
LYMPHS PCT: 27.6 % (ref 12.0–46.0)
Lymphs Abs: 2.9 10*3/uL (ref 0.7–4.0)
MCHC: 32.9 g/dL (ref 30.0–36.0)
MCV: 90.4 fl (ref 78.0–100.0)
MONOS PCT: 8.1 % (ref 3.0–12.0)
Monocytes Absolute: 0.8 10*3/uL (ref 0.1–1.0)
NEUTROS ABS: 6.6 10*3/uL (ref 1.4–7.7)
Neutrophils Relative %: 63.1 % (ref 43.0–77.0)
Platelets: 275 10*3/uL (ref 150.0–400.0)
RBC: 5.46 Mil/uL (ref 4.22–5.81)
RDW: 13.6 % (ref 11.5–15.5)
WBC: 10.4 10*3/uL (ref 4.0–10.5)

## 2015-06-02 LAB — COMPREHENSIVE METABOLIC PANEL
ALBUMIN: 4.5 g/dL (ref 3.5–5.2)
ALT: 30 U/L (ref 0–53)
AST: 28 U/L (ref 0–37)
Alkaline Phosphatase: 59 U/L (ref 39–117)
BILIRUBIN TOTAL: 0.6 mg/dL (ref 0.2–1.2)
BUN: 14 mg/dL (ref 6–23)
CO2: 29 meq/L (ref 19–32)
Calcium: 9.5 mg/dL (ref 8.4–10.5)
Chloride: 103 mEq/L (ref 96–112)
Creatinine, Ser: 1.21 mg/dL (ref 0.40–1.50)
GFR: 65.86 mL/min (ref >60.00)
GLUCOSE: 86 mg/dL (ref 70–99)
POTASSIUM: 4.5 meq/L (ref 3.5–5.1)
SODIUM: 139 meq/L (ref 135–145)
TOTAL PROTEIN: 7.8 g/dL (ref 6.0–8.3)

## 2015-06-02 LAB — TSH: TSH: 0.45 u[IU]/mL (ref 0.35–4.50)

## 2015-06-02 LAB — T4, FREE: Free T4: 0.86 ng/dL (ref 0.60–1.60)

## 2015-06-02 NOTE — Progress Notes (Signed)
Pre visit review using our clinic review tool, if applicable. No additional management support is needed unless otherwise documented below in the visit note. 

## 2015-06-02 NOTE — Telephone Encounter (Signed)
Seeing today 

## 2015-06-02 NOTE — Assessment & Plan Note (Signed)
Reports compliance with CPAP, feels sleeping well.

## 2015-06-02 NOTE — Progress Notes (Signed)
BP 130/94 mmHg  Pulse 84  Temp(Src) 97.5 F (36.4 C) (Oral)  Wt 228 lb (103.42 kg)   CC: discuss dizziness/fatigue  Subjective:    Patient ID: Dylan Fletcher, male    DOB: 03-05-1961, 54 y.o.   MRN: [id number]  HPI: Dylan Fletcher is a 54 y.o. male presenting on 06/02/2015 for Fatigue   Pleasant 54 yo s/p myomectomy and biatrial MAZE procedure for his HOCM with symptomatic atrial fibrillation at [facility] 08/2013. Surgery complicated by severe central mitral regurgitation leading to MVR with St Jude valve a few days later. On chronic anticoagulation with coumadin. Hospitalization also complicated by C diff infection s/p oral vancomycin course and mild CVA with residual aphasia postop. Had transient alteration of awareness 05/2014 then acute on chronic diastolic CHF and viral PNA 01/2015. Echo from 10/03/13 showing EF 60%, normally functioning prosthetic mitral valve, abnormal filling pressure.   Presents today with wife with several month history of worsening dizziness and fatigue. 2 episodes of having to sit down because he felt weak like he was going to pass out. Episodes described as generalized weakness and unsteadiness. Fatigue affecting ability to do his job. Sedentary job, but still has to lie down due to extreme exhaustion. Takes 2 naps during the day. No fever/chill, palpitations, chest pain, dyspnea with these episodes. At baseline endorses intermittent racing heart but not related to above episodes.   Has also noticed intermittent increased urinary frequency but not necessarily related to lasix dosing. One episode of 5 full bladder voids in 1 hour. Denies fevers, nocturia, dysuria. Some urgency and rare episodes of urge incontinence. No stress incontinence sxs. Has urine bags in his car for prolonged car rides.  Continues CPAP nightly for OSA.  30-45 min walk 4x/week for exercise. No sxs with exertion. His potassium, orthostatic BP, o2 sat and pulse appear fine at  home. Upcoming appt at [facility] 06/07/2015 with Dr Su Monks. To leave for [facility] this afternoon.   Relevant past medical, surgical, family and social history reviewed and updated as indicated. Interim medical history since our last visit reviewed. Allergies and medications reviewed and updated. Current Outpatient Prescriptions on File Prior to Visit  Medication Sig  . acetaminophen (TYLENOL) 325 MG tablet Take 650 mg by mouth as needed.  Marland Kitchen amoxicillin (AMOXIL) 500 MG tablet Take 500 mg by mouth as directed. 1 BID; start 24 hours prior to dental procedure and 24 hours post dental procedure  . amphetamine-dextroamphetamine (ADDERALL) 30 MG tablet Take 30 mg by mouth as directed. 30 mg QAM; 30 mg lunchtime; 15 mg late afternoon  . buPROPion (WELLBUTRIN XL) 150 MG 24 hr tablet Take 150 mg by mouth daily.  . Cholecalciferol (VITAMIN D) 2000 UNITS CAPS Take 1 capsule by mouth daily.  Marland Kitchen donepezil (ARICEPT) 5 MG tablet Take 2 tablets (10 mg total) by mouth at bedtime.  Marland Kitchen escitalopram (LEXAPRO) 10 MG tablet Take 10 mg by mouth daily.  . furosemide (LASIX) 40 MG tablet Take 1 tablet (40 mg total) by mouth daily.  . Glucosamine Sulfate 500 MG CAPS Take 1 capsule by mouth 3 (three) times daily.  . metoprolol succinate (TOPROL-XL) 25 MG 24 hr tablet Take 1 tablet (25 mg total) by mouth daily.  . Multiple Vitamin (MULTIVITAMIN) tablet Take 1 tablet by mouth daily.  Marland Kitchen oxyCODONE-acetaminophen (PERCOCET/ROXICET) 5-325 MG per tablet Take 1 tablet by mouth every 4 (four) hours as needed for severe pain.  . sildenafil (VIAGRA) 100 MG tablet Take 1  tablet (100 mg total) by mouth daily as needed for erectile dysfunction.  Marland Kitchen warfarin (COUMADIN) 7.5 MG tablet Take 7.5 mg by mouth as directed.    No current facility-administered medications on file prior to visit.   Past Medical History  Diagnosis Date  . History of asthma     as child  . Anxiety associated with depression     sees psych in  [place], [place] (meds from there)  . Allergic rhinitis     Ragweed, mold, mildew  . Arrhythmia   . HTN (hypertension)   . HLD (hyperlipidemia)   . Migraine with aura   . Colon polyp 04/2009    rectal tubular adenoma, rec rpt 3 yrs  . OSA (obstructive sleep apnea)     CPAP at night, up to 16 mmHg  . Hypertrophic obstructive cardiomyopathy(425.11)     Cards (Dr. Jodell Cipro [facility] - need abx prophylaxis - now established with [facility] Dr. Rosetta Posner [phone]) and Dr. Shearon Stalls and Octavia Heir s/p myomectomy, now cardiac rehab [facility] 09/2013  . Vitamin D deficiency   . ADD (attention deficit disorder)     sees psych in [place], [place] (meds from there)  . Obesity     saw nutritionist 12/18/2014  . ED (erectile dysfunction)   . Paroxysmal atrial fibrillation ([place]) 04/2013, 05/2013    with RVR; s/p multiple hospitalizations, spontaneous conversion, referred to Dr. Manuella Ghazi EP, then recurrence - failed sotalol, multaq (both with spont conversions)  . Claustrophobia   . Warfarin anticoagulation     goal INR 2.5-3.5  . H/O mitral valve replacement 08/2013    St Jude, needs abx ppx, completed cardiac rehab 12/2013  . CVA (cerebral infarction) 08/2013    L thalamic lacunar infarct post CVTS surgery, s/p neuropsychology testing, completed speech therapy. no ASA 2/2 no CAD hx  . Need for prophylactic antibiotic   . Transient alteration of awareness 05/2014    during hospitalization pending EEG [facility])  . LBBB (left bundle branch block) 08/2013    after myomectomy    Past Surgical History  Procedure Laterality Date  . Eye surgery  1969; 1971  . Wisdom tooth extraction  1977  . Inguinal hernia repair  1987  . Vasectomy  1992  . Knee arthroscopy  2006; 2007    right  . Colonoscopy  04/2009    rectal tubular adenoma x1, rpt 3 yrs  . Cardiac catheterization  2014    done for chest pain, no plaque buildup  . Pfts  07/2013    FVC 79%, FEV1 72%, ratio 0.71 - mild obstruction, o/w  normal  . Myomectomy  08/22/2013    with MAZE procedure [facility] (Drs San Morelle)  . Mitral valve replacement  08/2013    St Jude MV for severe central mitral regurg  . Hospitalization  04/2014    subtherapeutic INR, heparin bridge  . Colonoscopy  04/2014    mild diverticulosis, no polyps, rec rpt 5 yrs [facility])  . Eye surgery Right 04/2015    for PVD   Review of Systems Per HPI unless specifically indicated in ROS section     Objective:    BP 130/94 mmHg  Pulse 84  Temp(Src) 97.5 F (36.4 C) (Oral)  Wt 228 lb (103.42 kg)  Wt Readings from Last 3 Encounters:  06/02/15 228 lb (103.42 kg)  03/19/15 231 lb 8 oz (105.008 kg)  02/26/15 227 lb (102.967 kg)    Physical Exam  Constitutional: He is  oriented to person, place, and time. He appears well-developed and well-nourished. No distress.  Tired appearing  HENT:  Head: Normocephalic and atraumatic.  Mouth/Throat: Oropharynx is clear and moist. No oropharyngeal exudate.  Eyes: Conjunctivae and EOM are normal. Pupils are equal, round, and reactive to light.  Cardiovascular: Normal rate and intact distal pulses.   Murmur (mechanical heart valve) heard. Pulmonary/Chest: Effort normal and breath sounds normal. No respiratory distress. He has no wheezes. He has no rales.  Musculoskeletal: He exhibits no edema (tr).  Neurological: He is alert and oriented to person, place, and time. He has normal strength. He displays a negative Romberg sign. Coordination normal.  Station and gait intact  Skin: Skin is warm and dry. No rash noted.  Psychiatric: He has a normal mood and affect.  Nursing note and vitals reviewed.  Results for orders placed or performed in visit on 06/02/15  Comprehensive metabolic panel  Result Value Ref Range   Sodium 139 135 - 145 mEq/L   Potassium 4.5 3.5 - 5.1 mEq/L   Chloride 103 96 - 112 mEq/L   CO2 29 19 - 32 mEq/L   Glucose, Bld 86 70 - 99 mg/dL   BUN 14 6 - 23 mg/dL   Creatinine, Ser 1.21 0.40 -  1.50 mg/dL   Total Bilirubin 0.6 0.2 - 1.2 mg/dL   Alkaline Phosphatase 59 39 - 117 U/L   AST 28 0 - 37 U/L   ALT 30 0 - 53 U/L   Total Protein 7.8 6.0 - 8.3 g/dL   Albumin 4.5 3.5 - 5.2 g/dL   Calcium 9.5 8.4 - 10.5 mg/dL   GFR 65.86 >60.00 mL/min  TSH  Result Value Ref Range   TSH 0.45 0.35 - 4.50 uIU/mL  CBC with Differential/Platelet  Result Value Ref Range   WBC 10.4 4.0 - 10.5 K/uL   RBC 5.46 4.22 - 5.81 Mil/uL   Hemoglobin 16.3 13.0 - 17.0 g/dL   HCT 49.3 39.0 - 52.0 %   MCV 90.4 78.0 - 100.0 fl   MCHC 32.9 30.0 - 36.0 g/dL   RDW 13.6 11.5 - 15.5 %   Platelets 275.0 150.0 - 400.0 K/uL   Neutrophils Relative % 63.1 43.0 - 77.0 %   Lymphocytes Relative 27.6 12.0 - 46.0 %   Monocytes Relative 8.1 3.0 - 12.0 %   Eosinophils Relative 0.7 0.0 - 5.0 %   Basophils Relative 0.5 0.0 - 3.0 %   Neutro Abs 6.6 1.4 - 7.7 K/uL   Lymphs Abs 2.9 0.7 - 4.0 K/uL   Monocytes Absolute 0.8 0.1 - 1.0 K/uL   Eosinophils Absolute 0.1 0.0 - 0.7 K/uL   Basophils Absolute 0.1 0.0 - 0.1 K/uL  T4, free  Result Value Ref Range   Free T4 0.86 0.60 - 1.60 ng/dL  Vitamin B12  Result Value Ref Range   Vitamin B-12 610 211 - 911 pg/mL  POCT Urinalysis Dipstick  Result Value Ref Range   Color, UA Yellow    Clarity, UA Clear    Glucose, UA Negative    Bilirubin, UA Negative    Ketones, UA Negative    Spec Grav, UA 1.025    Blood, UA Negative    pH, UA 6.0    Protein, UA Negative    Urobilinogen, UA 0.2    Nitrite, UA Negative    Leukocytes, UA Negative Negative  Orthostatics normal. Ambulatory pulse ox normal (maintains at 99%).    Assessment & Plan:   Problem  List Items Addressed This Visit    Urinary frequency    Rare intermittent episodes not necessarily associated with lasix. UA today normal. Encouraged good hydration status.       Relevant Orders   POCT Urinalysis Dipstick (Completed)   OSA (obstructive sleep apnea)    Reports compliance with CPAP, feels sleeping well.       Hypertrophic obstructive cardiomyopathy ([place])    Upcoming f/u with [facility] clinic.       Relevant Orders   Comprehensive metabolic panel (Completed)   TSH (Completed)   CBC with Differential/Platelet (Completed)   T4, free (Completed)   Dizziness - Primary    Describes lightheadedness not seemingly associated with cardiac cause. Significant persistent fatigue. ?dehydration related. Does not describe true vertigo or syncope/presyncope.  Will check labwork today - all normal. Will also await upcoming cards eval. Pt/wife agree with plan.      Relevant Orders   TSH (Completed)   T4, free (Completed)   Vitamin B12 (Completed)    Other Visit Diagnoses    Chronic fatigue        Relevant Orders    Comprehensive metabolic panel (Completed)    TSH (Completed)    Need for hepatitis C screening test        Relevant Orders    Hepatitis C antibody, reflex        Follow up plan: Return if symptoms worsen or fail to improve.

## 2015-06-02 NOTE — Assessment & Plan Note (Signed)
Rare intermittent episodes not necessarily associated with lasix. UA today normal. Encouraged good hydration status.

## 2015-06-02 NOTE — Patient Instructions (Addendum)
Ambulatory pulse ox today. Orthostatic blood pressure.  labwork and urine check today. We will fax results to Dr Shearon Stalls.

## 2015-06-02 NOTE — Assessment & Plan Note (Addendum)
Describes lightheadedness not seemingly associated with cardiac cause. Significant persistent fatigue. ?dehydration related. Does not describe true vertigo or syncope/presyncope.  Will check labwork today - all normal. Will also await upcoming cards eval. Pt/wife agree with plan.

## 2015-06-02 NOTE — Assessment & Plan Note (Signed)
Upcoming f/u with [facility] clinic.

## 2015-06-29 ENCOUNTER — Encounter: Payer: Self-pay | Admitting: Family Medicine

## 2015-06-29 DIAGNOSIS — G4733 Obstructive sleep apnea (adult) (pediatric): Secondary | ICD-10-CM

## 2015-06-29 DIAGNOSIS — I421 Obstructive hypertrophic cardiomyopathy: Secondary | ICD-10-CM

## 2015-08-05 DIAGNOSIS — Z006 Encounter for examination for normal comparison and control in clinical research program: Secondary | ICD-10-CM | POA: Insufficient documentation

## 2015-08-28 ENCOUNTER — Encounter: Payer: Self-pay | Admitting: Family Medicine

## 2015-11-16 ENCOUNTER — Ambulatory Visit (INDEPENDENT_AMBULATORY_CARE_PROVIDER_SITE_OTHER): Payer: Commercial Managed Care - PPO | Admitting: Family

## 2015-11-16 ENCOUNTER — Encounter: Payer: Self-pay | Admitting: Family

## 2015-11-16 VITALS — BP 138/88 | HR 76 | Temp 97.8°F | Ht 66.0 in | Wt 221.6 lb

## 2015-11-16 DIAGNOSIS — W57XXXA Bitten or stung by nonvenomous insect and other nonvenomous arthropods, initial encounter: Secondary | ICD-10-CM | POA: Diagnosis not present

## 2015-11-16 DIAGNOSIS — T148 Other injury of unspecified body region: Secondary | ICD-10-CM | POA: Diagnosis not present

## 2015-11-16 MED ORDER — DOXYCYCLINE HYCLATE 100 MG PO TABS: 100.0000 mg | ORAL_TABLET | Freq: Two times a day (BID) | ORAL | Status: DC

## 2015-11-16 NOTE — Progress Notes (Signed)
Subjective:    Patient ID: Dylan Fletcher, male    DOB: June 08, 1961, 55 y.o.   MRN: [id number]   Dylan Fletcher is a 55 y.o. male who presents today for an acute visit.    HPI Comments: Patient here for evaluation of 3 tick bites on his left lower leg 2 weeks ago. Feels like left lower leg is warm to the touch. Did not see any attach ticks. Wife reports bull's-eye rash for one bite on posterior LLE.  No fever or chills. On warfarin.  Past Medical History  Diagnosis Date  . History of asthma     as child  . Anxiety associated with depression     sees psych in [place], [place] (meds from there)  . Allergic rhinitis     Ragweed, mold, mildew  . Arrhythmia   . HTN (hypertension)   . HLD (hyperlipidemia)   . Migraine with aura   . Colon polyp 04/2009    rectal tubular adenoma, rec rpt 3 yrs  . OSA (obstructive sleep apnea)     CPAP at night, up to 16 mmHg (Dr [place] Crane at [facility])  . Hypertrophic obstructive cardiomyopathy(425.11)     Cards (Dr. Jodell Cipro [facility] - need abx prophylaxis - now established with [facility] Dr. Rosetta Posner [phone]) and Dr. Shearon Stalls and Octavia Heir s/p myomectomy, now cardiac rehab [facility] 09/2013  . Vitamin D deficiency   . ADD (attention deficit disorder)     sees psych in [place], [place] (meds from there)  . Obesity     saw nutritionist 12/18/2014  . ED (erectile dysfunction)   . Paroxysmal atrial fibrillation ([place]) 04/2013, 05/2013    with RVR; s/p multiple hospitalizations, spontaneous conversion, referred to Dr. Manuella Ghazi EP, then recurrence - failed sotalol, multaq (both with spont conversions)  . Claustrophobia   . Warfarin anticoagulation     goal INR 2.5-3.5  . H/O mitral valve replacement 08/2013    [employer], needs abx ppx, completed cardiac rehab 12/2013  . CVA (cerebral infarction) 08/2013    L thalamic lacunar infarct post CVTS surgery, s/p neuropsychology testing, completed speech therapy. no ASA 2/2 no CAD hx  . Need for prophylactic antibiotic     . Transient alteration of awareness 05/2014    during hospitalization pending EEG [facility])  . LBBB (left bundle branch block) 08/2013    after myomectomy   Allergies: Nicardipine hcl; Nitroglycerin; and Calcium channel blockers Current Outpatient Prescriptions on File Prior to Visit  Medication Sig Dispense Refill  . acetaminophen (TYLENOL) 325 MG tablet Take 650 mg by mouth as needed.    Dylan Fletcher amoxicillin (AMOXIL) 500 MG tablet Take 500 mg by mouth as directed. 1 BID; start 24 hours prior to dental procedure and 24 hours post dental procedure    . amphetamine-dextroamphetamine (ADDERALL) 30 MG tablet Take 30 mg by mouth as directed. 30 mg QAM; 30 mg lunchtime; 15 mg late afternoon    . buPROPion (WELLBUTRIN XL) 150 MG 24 hr tablet Take 150 mg by mouth daily.    . cetirizine (ZYRTEC) 10 MG tablet Take 10 mg by mouth daily.    . Cholecalciferol (VITAMIN D) 2000 UNITS CAPS Take 1 capsule by mouth daily.    Dylan Fletcher donepezil (ARICEPT) 5 MG tablet Take 2 tablets (10 mg total) by mouth at bedtime.    Dylan Fletcher escitalopram (LEXAPRO) 10 MG tablet Take 10 mg by mouth daily.    . famotidine (PEPCID) 20 MG tablet Take 20 mg  by mouth daily.    . fluticasone (FLONASE) 50 MCG/ACT nasal spray Place 2 sprays into both nostrils daily.    . furosemide (LASIX) 20 MG tablet Take 1 tablet (20 mg total) by mouth daily. With extra prn weight gain    . Glucosamine Sulfate 500 MG CAPS Take 1 capsule by mouth 3 (three) times daily.    . metoprolol succinate (TOPROL-XL) 25 MG 24 hr tablet Take 1 tablet (25 mg total) by mouth daily.    . Multiple Vitamin (MULTIVITAMIN) tablet Take 1 tablet by mouth daily.    Dylan Fletcher oxyCODONE-acetaminophen (PERCOCET/ROXICET) 5-325 MG per tablet Take 1 tablet by mouth every 4 (four) hours as needed for severe pain.    . sildenafil (VIAGRA) 100 MG tablet Take 1 tablet (100 mg total) by mouth daily as needed for erectile dysfunction. 10 tablet 3  . warfarin (COUMADIN) 7.5 MG tablet Take 7.5 mg by  mouth as directed.      No current facility-administered medications on file prior to visit.    Social History  Substance Use Topics  . Smoking status: Never Smoker   . Smokeless tobacco: Never Used  . Alcohol Use: Yes     Comment: 1-2 drinks/weekly    Review of Systems  Constitutional: Negative for fever and chills.  Respiratory: Negative for cough.   Cardiovascular: Negative for chest pain, palpitations and leg swelling.  Gastrointestinal: Negative for nausea, vomiting and abdominal distention.  Musculoskeletal: Negative for myalgias and arthralgias.  Skin: Negative for wound.  Neurological: Negative for headaches.      Objective:    BP 138/88 mmHg  Pulse 76  Temp(Src) 97.8 F (36.6 C)  Ht 5\' 6"  (1.676 m)  Wt 221 lb 9.6 oz (100.517 kg)  BMI 35.78 kg/m2  SpO2 96%   Physical Exam  Constitutional: He appears well-developed and well-nourished.  Cardiovascular: Regular rhythm and normal heart sounds.   No LE edema, palpable cords or masses. No erythema or increased warmth. Negative Homan sign bilaterally.  LE hair growth symmetric and present. No discoloration of varicosities noted. LE warm and palpable pedal pulses.   Pulmonary/Chest: Effort normal and breath sounds normal. No respiratory distress. He has no wheezes. He has no rhonchi. He has no rales.  Lymphadenopathy:       Head (left side): No submandibular and no preauricular adenopathy present.  Neurological: He is alert.  Skin: Skin is warm and dry. Rash noted. Rash is not vesicular. There is erythema.     Largest area of erythema LLE approx 2 cm dorsal aspect of chin. No bulleye, swelling, or drainage.    Noted 3 other scabs, healed - one on front of LLE and 2 on the calf.   Psychiatric: He has a normal mood and affect. His speech is normal and behavior is normal.  Vitals reviewed.      Assessment & Plan:   1. Tick bite Working diagnosis of lyme disease. Full treatment for Lyme disease as wife reports  bull's-eye rash. No systemic features.   - doxycycline (VIBRA-TABS) 100 MG tablet; Take 1 tablet (100 mg total) by mouth 2 (two) times daily.  Dispense: 20 tablet; Refill: 0 - check INR within 2 days   I am having Mr. Roche Hartel maintain his amphetamine-dextroamphetamine, sildenafil, amoxicillin, oxyCODONE-acetaminophen, multivitamin, acetaminophen, warfarin, metoprolol succinate, Vitamin D, Glucosamine Sulfate, buPROPion, escitalopram, donepezil, famotidine, fluticasone, cetirizine, and furosemide.   No orders of the defined types were placed in this encounter.     Start medications as  prescribed and explained to patient on After Visit Summary ( AVS). Risks, benefits, and alternatives of the medications and treatment plan prescribed today were discussed, and patient expressed understanding.   Education regarding symptom management and diagnosis given to patient.   Follow-up:Plan follow-up and return precautions given if any worsening symptoms or change in condition.   Continue to follow with Ria Bush, MD for routine health maintenance.   Peggyann Shoals and I agreed with plan.   Mable Paris, FNP

## 2015-11-16 NOTE — Patient Instructions (Addendum)
Please have INR checked within 2 days of this drug administration and levels may increase.  Let's follow these tick bites with close observation. Information below on Lyme disease and Tick bites in general.   If there is no improvement in your symptoms, or if there is any worsening of symptoms, or if you have any additional concerns, please return for re-evaluation; or, if we are closed, consider going to the Emergency Room for evaluation if symptoms urgent.  Patient education: Lyme disease (The Basics)[place] in [place]  Written by the doctors and Therapist, music at [employer]  What is Lyme disease? - Lyme disease is an illness that can make you feel like you have the flu. It can also cause a rash, fever, or nerve, joint, or heart problems. People can get Lyme disease after being bitten by a tiny insect called a tick. When a certain type of tick bites you, it can transmit the germ that causes Lyme disease from its body to yours. But a tick can infect you only if it stays attached for at least a day. The ticks that carry Lyme disease feed on deer and mice. Ticks are found in tall grass and on shrubs, and can attach to animals and people walking by. Ticks cannot fly or jump. What are the symptoms of Lyme disease? - Symptoms can start days or weeks after a tick bite. They include: ?A rash where you were bitten - The rash often appears within a month of getting bitten. It is red, but its center can be the color of your skin. It might get bigger over a few days. To some, it looks like a "bull's eye" (picture 1). ?Fever ?Feeling tired ?Body aches and pains ?Heart problems such as a slowed heart rate ?Headache and stiff neck ?Feelings of pain, weakness, or numbness If a person is not treated, further symptoms can occur months to years after a tick bite. These include: ?Pain and swelling of joints, such as your knees ?Trouble with your memory and thinking ?Skin problems, such as skin swelling or thinning (this  occurs mostly in [place]) Is there a test for Lyme disease? - Yes. Blood tests can show if you are infected with the germ that causes Lyme disease. But, it takes time for the blood tests to turn positive. This means the tests won't work if you get them right after being bitten. Also, sometimes the blood tests come back negative even when you have the rash that goes with Lyme disease. Because of this, if you have the rash, the blood test is not needed to confirm that you have Lyme disease. If your doctor or nurse suspects you have Lyme disease, he or she will do an exam and ask you questions. The doctor or nurse will use this information (and your blood test result, if needed) to decide about treatment. What should I do if I get bitten by a tick or if my child gets bitten? - If you find a tick on your body or on your child, use tweezers to grab it. Then pull it out slowly and gently. After that, wash the area with soap and water. You do not need to keep the tick. But knowing what it looked like can help your doctor decide about your treatment. See if you can tell: ?Its color and size ?If it was attached to your skin or just resting on your skin ?If it was big, round, and full of blood (picture 2) You should watch the area around  the bite for a month to see if a rash occurs. Should I see a doctor or nurse? - See your doctor or nurse if you have a tick and you cannot get it off or if you think you have had a tick attached for at least 36 hours (a day and a half). You should also see a doctor or nurse if you develop symptoms of Lyme disease. Some people don't know that they were bitten by a tick. Or they might not remember having a rash or early symptoms of Lyme disease. How is Lyme disease treated? - Lyme disease is usually treated with antibiotics. Treatment with antibiotics should help your symptoms go away. Sometimes, symptoms improve quickly. Other times, it can take weeks or months for symptoms to go  away. Your doctor might prescribe medicine for you to take right after a tick bite. Or your doctor might wait to see if you first develop symptoms. Either way, the medicine will treat your Lyme disease. What can I do to try to avoid getting bitten by a tick? - You can: ?Wear shoes, long-sleeved shirts, and long pants when you go outside. Keep ticks away from your skin by tucking your pants into your socks. ?Wear light colors so you can spot any ticks that get on your clothes ?Use bug sprays to keep ticks off your skin or clothes ?Shower within 2 hours of being outdoors if you think you have been in an area where there are ticks ?Check your clothes and body for ticks after being outdoors. Be sure to check your scalp, waist, armpits, groin, and backs of your knees. Check your children, too. ?If you live in a place that has deer or mice nearby, take steps to keep those animals away. Deer and mice carry ticks.   Tick Bite Information Ticks are insects that attach themselves to the skin and draw blood for food. There are various types of ticks. Common types include wood ticks and deer ticks. Most ticks live in shrubs and grassy areas. Ticks can climb onto your body when you make contact with leaves or grass where the tick is waiting. The most common places on the body for ticks to attach themselves are the scalp, neck, armpits, waist, and groin. Most tick bites are harmless, but sometimes ticks carry germs that cause diseases. These germs can be spread to a person during the tick's feeding process. The chance of a disease spreading through a tick bite depends on:   The type of tick.  Time of year.   How long the tick is attached.   Geographic location.  HOW CAN YOU PREVENT TICK BITES? Take these steps to help prevent tick bites when you are outdoors:  Wear protective clothing. Long sleeves and long pants are best.   Wear white clothes so you can see ticks more easily.  Tuck your pant legs  into your socks.   If walking on a trail, stay in the middle of the trail to avoid brushing against bushes.  Avoid walking through areas with long grass.  Put insect repellent on all exposed skin and along boot tops, pant legs, and sleeve cuffs.   Check clothing, hair, and skin repeatedly and before going inside.   Brush off any ticks that are not attached.  Take a shower or bath as soon as possible after being outdoors.  WHAT IS THE PROPER WAY TO REMOVE A TICK? Ticks should be removed as soon as possible to help prevent diseases caused  by tick bites. 1. If latex gloves are available, put them on before trying to remove a tick.  2. Using fine-point tweezers, grasp the tick as close to the skin as possible. You may also use curved forceps or a tick removal tool. Grasp the tick as close to its head as possible. Avoid grasping the tick on its body. 3. Pull gently with steady upward pressure until the tick lets go. Do not twist the tick or jerk it suddenly. This may break off the tick's head or mouth parts. 4. Do not squeeze or crush the tick's body. This could force disease-carrying fluids from the tick into your body.  5. After the tick is removed, wash the bite area and your hands with soap and water or other disinfectant such as alcohol. 6. Apply a small amount of antiseptic cream or ointment to the bite site.  7. Wash and disinfect any instruments that were used.  Do not try to remove a tick by applying a hot match, petroleum jelly, or fingernail polish to the tick. These methods do not work and may increase the chances of disease being spread from the tick bite.  WHEN SHOULD YOU SEEK MEDICAL CARE? Contact your health care provider if you are unable to remove a tick from your skin or if a part of the tick breaks off and is stuck in the skin.  After a tick bite, you need to be aware of signs and symptoms that could be related to diseases spread by ticks. Contact your health care  provider if you develop any of the following in the days or weeks after the tick bite:  Unexplained fever.  Rash. A circular rash that appears days or weeks after the tick bite may indicate the possibility of Lyme disease. The rash may resemble a target with a bull's-eye and may occur at a different part of your body than the tick bite.  Redness and swelling in the area of the tick bite.   Tender, swollen lymph glands.   Diarrhea.   Weight loss.   Cough.   Fatigue.   Muscle, joint, or bone pain.   Abdominal pain.   Headache.   Lethargy or a change in your level of consciousness.  Difficulty walking or moving your legs.   Numbness in the legs.   Paralysis.  Shortness of breath.   Confusion.   Repeated vomiting.    This information is not intended to replace advice given to you by your health care provider. Make sure you discuss any questions you have with your health care provider.   Document Released: 06/02/2000 Document Revised: 06/26/2014 Document Reviewed: 11/13/2012 Elsevier Interactive Patient Education [employer].

## 2016-01-23 ENCOUNTER — Encounter: Payer: Self-pay | Admitting: Family Medicine

## 2016-01-29 ENCOUNTER — Emergency Department
Admission: EM | Admit: 2016-01-29 | Discharge: 2016-01-29 | Disposition: A | Discharge status: Home / Self Care | Payer: Commercial Managed Care - PPO | Attending: Student | Admitting: Student

## 2016-01-29 ENCOUNTER — Encounter: Payer: Self-pay | Admitting: Emergency Medicine

## 2016-01-29 DIAGNOSIS — Z79899 Other long term (current) drug therapy: Secondary | ICD-10-CM | POA: Diagnosis not present

## 2016-01-29 DIAGNOSIS — Y93H2 Activity, gardening and landscaping: Secondary | ICD-10-CM | POA: Insufficient documentation

## 2016-01-29 DIAGNOSIS — T63461A Toxic effect of venom of wasps, accidental (unintentional), initial encounter: Secondary | ICD-10-CM | POA: Diagnosis not present

## 2016-01-29 DIAGNOSIS — Z7901 Long term (current) use of anticoagulants: Secondary | ICD-10-CM | POA: Insufficient documentation

## 2016-01-29 DIAGNOSIS — X58XXXA Exposure to other specified factors, initial encounter: Secondary | ICD-10-CM | POA: Diagnosis not present

## 2016-01-29 DIAGNOSIS — R52 Pain, unspecified: Secondary | ICD-10-CM | POA: Diagnosis present

## 2016-01-29 DIAGNOSIS — F909 Attention-deficit hyperactivity disorder, unspecified type: Secondary | ICD-10-CM | POA: Insufficient documentation

## 2016-01-29 DIAGNOSIS — Y92007 Garden or yard of unspecified non-institutional (private) residence as the place of occurrence of the external cause: Secondary | ICD-10-CM | POA: Diagnosis not present

## 2016-01-29 DIAGNOSIS — I10 Essential (primary) hypertension: Secondary | ICD-10-CM | POA: Diagnosis not present

## 2016-01-29 DIAGNOSIS — J45909 Unspecified asthma, uncomplicated: Secondary | ICD-10-CM | POA: Insufficient documentation

## 2016-01-29 DIAGNOSIS — Z792 Long term (current) use of antibiotics: Secondary | ICD-10-CM | POA: Insufficient documentation

## 2016-01-29 DIAGNOSIS — Y999 Unspecified external cause status: Secondary | ICD-10-CM | POA: Diagnosis not present

## 2016-01-29 LAB — PROTIME-INR
INR: 2.36
Prothrombin Time: 26.2 seconds — ABNORMAL HIGH (ref 11.4–15.2)

## 2016-01-29 MED ORDER — MORPHINE SULFATE (PF) 4 MG/ML IV SOLN
INTRAVENOUS | Status: AC
  Filled 2016-01-29: qty 1

## 2016-01-29 MED ORDER — MORPHINE SULFATE (PF) 4 MG/ML IV SOLN
4.0000 mg | Freq: Once | INTRAVENOUS | Status: AC
  Administered 2016-01-29: 4 mg via INTRAMUSCULAR

## 2016-01-29 MED ORDER — OXYCODONE HCL 5 MG PO TABS
10.0000 mg | ORAL_TABLET | Freq: Once | ORAL | Status: AC
  Administered 2016-01-29: 10 mg via ORAL
  Filled 2016-01-29: qty 2

## 2016-01-29 NOTE — ED Triage Notes (Signed)
Approx 45 minutes ago multiple wasp stings, weak, no resp distress.

## 2016-01-29 NOTE — ED Provider Notes (Signed)
[facility] Emergency Department Provider Note   ____________________________________________   First MD Initiated Contact with Patient 01/29/16 1722     (approximate)  I have reviewed the triage vital signs and the nursing notes.   HISTORY  Chief Complaint Insect Bite    HPI Dylan Fletcher is a 55 y.o. male with history of hypertrophic cardiomyopathy, mitral valve replacement on Coumadin, LBBB, hypertension, hyperlipidemia, ADHD who presents for evaluation of pain in various parts of his body related to multiple wasp stings which occurred suddenly just prior to arrival, pain has been constant, severe, no modifying factors. Patient reports that he was doing some gardening when he accidentally found a wasp nest on the ground and he was attacked by wasps, he was stung at least 12 times. He denies any mouth, lip or tongue swelling, no shortness of breath, no nausea or vomiting. He reports that his things are not pruritic, they're just painful. He denies any chest pain. He has no history of an anaphylactic reaction to anything, has never required an EpiPen. He did take Benadryl prior to arrival.   Past Medical History:  Diagnosis Date  . ADD (attention deficit disorder)    sees psych in [place], [place] (meds from there)  . Allergic rhinitis    Ragweed, mold, mildew  . Anxiety associated with depression    sees psych in [place], [place] (meds from there)  . Arrhythmia   . Claustrophobia   . Colon polyp 04/2009   rectal tubular adenoma, rec rpt 3 yrs  . CVA (cerebral infarction) 08/2013   L thalamic lacunar infarct post CVTS surgery, s/p neuropsychology testing, completed speech therapy. no ASA 2/2 no CAD hx  . ED (erectile dysfunction)   . H/O mitral valve replacement 08/2013   St Jude, needs abx ppx, completed cardiac rehab 12/2013  . History of asthma    as child  . HLD (hyperlipidemia)   . HTN (hypertension)   . Hypertrophic obstructive cardiomyopathy(425.11)      Cards (Dr. Jodell Cipro [facility] - need abx prophylaxis - now established with [facility] Dr. Rosetta Posner [phone]) and Dr. Shearon Stalls and Octavia Heir s/p myomectomy, now cardiac rehab [facility] 09/2013  . LBBB (left bundle branch block) 08/2013   after myomectomy  . Migraine with aura   . Need for prophylactic antibiotic   . Obesity    saw nutritionist 12/18/2014  . OSA (obstructive sleep apnea)    CPAP at night, up to 16 mmHg (Dr Nevada Crane at [facility])  . Paroxysmal atrial fibrillation ([place]) 04/2013, 05/2013   with RVR; s/p multiple hospitalizations, spontaneous conversion, referred to Dr. Manuella Ghazi EP, then recurrence - failed sotalol, multaq (both with spont conversions)  . Transient alteration of awareness 05/2014   during hospitalization pending EEG [facility])  . Vitamin D deficiency   . Warfarin anticoagulation    goal INR 2.5-3.5    Patient Active Problem List   Diagnosis Date Noted  . Urinary frequency 06/02/2015  . Dizziness 06/02/2015  . Health maintenance examination 03/19/2015  . PVD (posterior vitreous detachment), left eye 03/19/2015  . Globus sensation 12/02/2014  . Transient alteration of awareness 05/19/2014  . History of adenomatous polyp of colon 03/10/2014  . Malaise 12/18/2013  . Anemia 10/24/2013  . Encounter for therapeutic drug monitoring 09/18/2013  . Aphasia due to recent cerebral infarction 09/12/2013  . CVA (cerebral vascular accident) ([place]) 09/12/2013  . Warfarin anticoagulation   . H/O mitral valve replacement 08/17/2013  . LBBB (  left bundle branch block) 08/17/2013  . History of atrial fibrillation   . ED (erectile dysfunction)   . Healthcare maintenance 02/07/2012  . Obesity, Class II, BMI 35-39.9, with comorbidity ([place])   . Vitamin D deficiency   . Hypertrophic obstructive cardiomyopathy ([place])   . OSA (obstructive sleep apnea)   . Migraine with aura   . HLD (hyperlipidemia)   . HTN (hypertension)   . Allergic rhinitis   . Anxiety  associated with depression     Past Surgical History:  Procedure Laterality Date  . CARDIAC CATHETERIZATION  2014   done for chest pain, no plaque buildup  . COLONOSCOPY  04/2009   rectal tubular adenoma x1, rpt 3 yrs  . COLONOSCOPY  04/2014   mild diverticulosis, no polyps, rec rpt 5 yrs [facility])  . [place]; 1971  . EYE SURGERY Right 04/2015   for PVD  . hospitalization  04/2014   subtherapeutic INR, heparin bridge  . INGUINAL HERNIA REPAIR  1987  . KNEE ARTHROSCOPY  2006; 2007   right  . MITRAL VALVE REPLACEMENT  08/2013   St Jude MV for severe central mitral regurg  . MYOMECTOMY  08/22/2013   with MAZE procedure [facility] (Drs [place])  . PFTs  07/2013   FVC 79%, FEV1 72%, ratio 0.71 - mild obstruction, o/w normal  . VASECTOMY  1992  . [place]    Prior to Admission medications   Medication Sig Start Date End Date Taking? Authorizing Provider  acetaminophen (TYLENOL) 325 MG tablet Take 650 mg by mouth as needed.    Historical Provider, MD  amoxicillin (AMOXIL) 500 MG tablet Take 500 mg by mouth as directed. 1 BID; start 24 hours prior to dental procedure and 24 hours post dental procedure    Historical Provider, MD  amphetamine-dextroamphetamine (ADDERALL) 30 MG tablet Take 30 mg by mouth as directed. 30 mg QAM; 30 mg lunchtime; 15 mg late afternoon    Historical Provider, MD  buPROPion (WELLBUTRIN XL) 150 MG 24 hr tablet Take 150 mg by mouth daily.    Historical Provider, MD  cetirizine (ZYRTEC) 10 MG tablet Take 10 mg by mouth daily.    Historical Provider, MD  Cholecalciferol (VITAMIN D) 2000 UNITS CAPS Take 1 capsule by mouth daily.    Historical Provider, MD  donepezil (ARICEPT) 5 MG tablet Take 2 tablets (10 mg total) by mouth at bedtime. 03/19/15   Ria Bush, MD  doxycycline (VIBRA-TABS) 100 MG tablet Take 1 tablet (100 mg total) by mouth 2 (two) times daily. 11/16/15   Burnard Hawthorne, FNP  escitalopram (LEXAPRO) 10 MG  tablet Take 10 mg by mouth daily.    Historical Provider, MD  famotidine (PEPCID) 20 MG tablet Take 20 mg by mouth daily.    Historical Provider, MD  fluticasone (FLONASE) 50 MCG/ACT nasal spray Place 2 sprays into both nostrils daily.    Historical Provider, MD  furosemide (LASIX) 20 MG tablet Take 1 tablet (20 mg total) by mouth daily. With extra prn weight gain 07/01/15   Ria Bush, MD  Glucosamine Sulfate 500 MG CAPS Take 1 capsule by mouth 3 (three) times daily.    Historical Provider, MD  metoprolol succinate (TOPROL-XL) 25 MG 24 hr tablet Take 1 tablet (25 mg total) by mouth daily. 10/24/13   Ria Bush, MD  Multiple Vitamin (MULTIVITAMIN) tablet Take 1 tablet by mouth daily.    Historical Provider, MD  oxyCODONE-acetaminophen (PERCOCET/ROXICET) 5-325 MG  per tablet Take 1 tablet by mouth every 4 (four) hours as needed for severe pain.    Historical Provider, MD  sildenafil (VIAGRA) 100 MG tablet Take 1 tablet (100 mg total) by mouth daily as needed for erectile dysfunction. 02/06/13   Ria Bush, MD  warfarin (COUMADIN) 7.5 MG tablet Take 7.5 mg by mouth as directed.     Historical Provider, MD    Allergies Nicardipine hcl; Nitroglycerin; and Calcium channel blockers  Family History  Problem Relation Age of Onset  . Alcohol abuse Brother   . Cancer Mother 50    breast  . Ulcers Mother   . Heart disease Mother     HOCM  . Psoriasis Father   . Cancer Maternal Grandfather     colon  . Coronary artery disease Paternal Grandfather 35    MI  . Diabetes Neg Hx   . Stroke Neg Hx     Social History Social History  Substance Use Topics  . Smoking status: Never Smoker  . Smokeless tobacco: Never Used  . Alcohol use Yes     Comment: 1-2 drinks/weekly    Review of Systems Constitutional: No fever/chills Eyes: No visual changes. ENT: No sore throat. Cardiovascular: Denies chest pain. Respiratory: Denies shortness of breath. Gastrointestinal: No abdominal pain.   No nausea, no vomiting.  No diarrhea.  No constipation. Genitourinary: Negative for dysuria. Musculoskeletal: Negative for back pain. Skin: Negative for rash. Neurological: Negative for headaches, focal weakness or numbness.  10-point ROS otherwise negative.  ____________________________________________   PHYSICAL EXAM:  Vitals:   01/29/16 1730 01/29/16 1800 01/29/16 1830 01/29/16 1900  BP: (!) 144/83 (!) 122/96 133/82 130/75  Pulse: 91 91 88 83  Resp:  (!) 24 (!) 24 15  Temp:      TempSrc:      SpO2: 96% 99% 99% 95%  Weight:      Height:        VITAL SIGNS: ED Triage Vitals  Enc Vitals Group     BP 01/29/16 1705 (!) 140/93     Pulse Rate 01/29/16 1705 92     Resp 01/29/16 1705 18     Temp 01/29/16 1705 98.5 F (36.9 C)     Temp Source 01/29/16 1705 Oral     SpO2 01/29/16 1705 95 %     Weight 01/29/16 1706 217 lb (98.4 kg)     Height 01/29/16 1706 5\' 6"  (1.676 m)     Head Circumference --      Peak Flow --      Pain Score 01/29/16 1706 9     Pain Loc --      Pain Edu? --      Excl. in [place]? --     Constitutional: Alert and oriented. Nontoxic- appearing and in no acute distress. Eyes: Conjunctivae are normal. PERRL. EOMI. Head: Atraumatic. Nose: No congestion/rhinnorhea. Mouth/Throat: Mucous membranes are moist.  Oropharynx non-erythematous. No edema of the oropharynx. Neck: No stridor.   Cardiovascular: Normal rate, regular rhythm. Grossly normal heart sounds.  Good peripheral circulation. Respiratory: Normal respiratory effort.  No retractions. Lungs CTAB. No wheeze. Deferred. Gastrointestinal: Soft and nontender. No distention.  No CVA tenderness. Genitourinary:  Musculoskeletal: No lower extremity tenderness nor edema.  No joint effusions. Neurologic:  Normal speech and language. No gross focal neurologic deficits are appreciated. No gait instability. Skin:  Skin is warm, dry. Several small, discrete, less than 0.5 cm raised erythematous papules on the left  hand, 2 on the  abdomen, 3 on the neck consistent with wasp stings/insect bites. Psychiatric: Mood and affect are normal. Speech and behavior are normal.  ____________________________________________   LABS (all labs ordered are listed, but only abnormal results are displayed)  Labs Reviewed  PROTIME-INR - Abnormal; Notable for the following:       Result Value   Prothrombin Time 26.2 (*)    All other components within normal limits   ____________________________________________  EKG  none ____________________________________________  RADIOLOGY  none ____________________________________________   PROCEDURES  Procedure(s) performed: None  Procedures  Critical Care performed: No  ____________________________________________   INITIAL IMPRESSION / ASSESSMENT AND PLAN / ED COURSE  Pertinent labs & imaging results that were available during my care of the patient were reviewed by me and considered in my medical decision making (see chart for details).  Jeremiyah Morgese is a 55 y.o. male with history of hypertrophic cardiomyopathy, mitral valve replacement on Coumadin, LBBB, hypertension, hyperlipidemia, ADHD who presents for evaluation of pain in various parts of his body related to multiple wasp stings which occurred suddenly just prior to arrival. On exam, he is nontoxic appearing and in no acute distress. Vital signs stable, he is afebrile. He appears to be having a local reaction elated to wasp stings, there is no confluence, there is no concern for airway compromise, not hypotensive, only one organ system involved, no concern for anaphylaxis. He received Benadryl at home, will treat with oxycodone for pain, check his INR given that he is treated with Coumadin and reassess for disposition.  ----------------------------------------- 8:18 PM on 01/29/2016 ----------------------------------------- Patient reports significant improvement of his pain at this time, he appears  comfortable, he is watching a football game on his phone. Still with no itching, no shortness of breath, no nausea or vomiting, no new symptoms, the bug bites on his body are unchanged. We Discussed Return Precautions and Need for Close PCP Follow-Up, He is comfortable  with the Discharge Plan. INR 2.36, Therapeutic.  Clinical Course     ____________________________________________   FINAL CLINICAL IMPRESSION(S) / ED DIAGNOSES  Final diagnoses:  Wasp sting, accidental or unintentional, initial encounter      NEW MEDICATIONS STARTED DURING THIS VISIT:  New Prescriptions   No medications on file     Note:  This document was prepared using Dragon voice recognition software and may include unintentional dictation errors.    Joanne Gavel, MD 01/29/16 2019

## 2016-04-19 ENCOUNTER — Encounter: Payer: Self-pay | Admitting: Family Medicine

## 2016-04-27 ENCOUNTER — Ambulatory Visit (INDEPENDENT_AMBULATORY_CARE_PROVIDER_SITE_OTHER)
Admission: RE | Admit: 2016-04-27 | Discharge: 2016-04-27 | Disposition: A | Discharge status: Home / Self Care | Payer: Commercial Managed Care - PPO | Source: Ambulatory Visit | Attending: Family Medicine | Admitting: Family Medicine

## 2016-04-27 ENCOUNTER — Encounter: Payer: Self-pay | Admitting: Family Medicine

## 2016-04-27 ENCOUNTER — Ambulatory Visit (INDEPENDENT_AMBULATORY_CARE_PROVIDER_SITE_OTHER): Payer: Commercial Managed Care - PPO | Admitting: Family Medicine

## 2016-04-27 VITALS — BP 130/80 | HR 80 | Temp 98.0°F | Wt 218.2 lb

## 2016-04-27 DIAGNOSIS — R0789 Other chest pain: Secondary | ICD-10-CM

## 2016-04-27 DIAGNOSIS — I421 Obstructive hypertrophic cardiomyopathy: Secondary | ICD-10-CM | POA: Diagnosis not present

## 2016-04-27 DIAGNOSIS — I6930 Unspecified sequelae of cerebral infarction: Secondary | ICD-10-CM | POA: Diagnosis not present

## 2016-04-27 DIAGNOSIS — E669 Obesity, unspecified: Secondary | ICD-10-CM

## 2016-04-27 DIAGNOSIS — IMO0001 Reserved for inherently not codable concepts without codable children: Secondary | ICD-10-CM

## 2016-04-27 MED ORDER — DICLOFENAC SODIUM 1 % TD GEL: 1.0000 "application " | Freq: Two times a day (BID) | TRANSDERMAL | 0 refills | Status: DC

## 2016-04-27 NOTE — Assessment & Plan Note (Signed)
Body mass index is 35.23 kg/m².

## 2016-04-27 NOTE — Assessment & Plan Note (Addendum)
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 I will await disability forms.

## 2016-04-27 NOTE — Patient Instructions (Addendum)
EKG today Xray today I think you have bony rib bruise or rib strain. Continue tylenol, heating pad.  Trial voltaren gel for topical relief.  Return for physical at your convenience.

## 2016-04-27 NOTE — Progress Notes (Addendum)
BP 130/80   Pulse 80   Temp 98 F (36.7 C) (Oral)   Wt 218 lb 4 oz (99 kg)   BMI 35.23 kg/m    CC: discuss chest wall/ribcage pain Subjective:    Patient ID: Peggyann Shoals, male    DOB: 1961-01-07, 55 y.o.   MRN: [id number]  HPI: Nhan Marandola is a 55 y.o. male presenting on 04/27/2016 for Chest Pain (left lower ribcage area; no recalled injury; increases with movement)   Last seen by me 05/2015. Patient s/p myomectomy and biatrial MAZE procedure for his HOCM with symptomatic atrial fibrillation at [facility] 08/2013. Surgery complicated by severe central mitral regurgitation leading to MVR with St Jude valve a few days later. On chronic anticoagulation with coumadin. Hospitalization also complicated by C diff infection s/p oral vancomycin course and mild CVA with residual aphasia postop. Had transient alteration of awareness 05/2014 then acute on chronic diastolic CHF and viral PNA 01/2015. Echo from 10/03/13 showing EF 60%, normally functioning prosthetic mitral valve, abnormal filling pressure.   Presents today with 10d h/o L lower ribcage pain reproducible with palpation, worse with coughing or sneezing or deep breaths. Pain has now localized to left lower costochondral junction. Heating pad, tens unit, tylenol have not helped. Denies inciting trauma/injury. He has had some falls but doesn't remember hitting his side. No improvement over the last 2 weeks. Some dyspnea from pain. No cough, palpitations. Movement causes more pain. No pain with mowing lawn or with playing pickle ball.   They have left over oxycodone from [place] clinic for breakthrough pain.   OSA on CPAP nightly. Has new machine. This is followed by Dr Nevada Crane pulmonologist at [facility].  To transition to new cardiologist at [facility].  Continues seeing speech therapy.   Unemployed since 07/2015. Has been unable to find job. Has seen neuropsych. To apply for disability.   Relevant past medical, surgical, family and  social history reviewed and updated as indicated. Interim medical history since our last visit reviewed. Allergies and medications reviewed and updated. Current Outpatient Prescriptions on File Prior to Visit  Medication Sig  . acetaminophen (TYLENOL) 325 MG tablet Take 650 mg by mouth as needed.  Marland Kitchen amoxicillin (AMOXIL) 500 MG tablet Take 500 mg by mouth as directed. 1 BID; start 24 hours prior to dental procedure and 24 hours post dental procedure  . buPROPion (WELLBUTRIN XL) 150 MG 24 hr tablet Take 150 mg by mouth daily.  . cetirizine (ZYRTEC) 10 MG tablet Take 10 mg by mouth daily.  . Cholecalciferol (VITAMIN D) 2000 UNITS CAPS Take 1 capsule by mouth daily.  Marland Kitchen donepezil (ARICEPT) 5 MG tablet Take 2 tablets (10 mg total) by mouth at bedtime.  Marland Kitchen doxycycline (VIBRA-TABS) 100 MG tablet Take 1 tablet (100 mg total) by mouth 2 (two) times daily. (Patient taking differently: Take 100 mg by mouth 2 (two) times daily. Prior to dental procedures)  . escitalopram (LEXAPRO) 10 MG tablet Take 10 mg by mouth daily.  . famotidine (PEPCID) 20 MG tablet Take 20 mg by mouth daily.  . furosemide (LASIX) 20 MG tablet Take 1 tablet (20 mg total) by mouth daily. With extra prn weight gain  . Glucosamine Sulfate 500 MG CAPS Take 1 capsule by mouth daily.   . metoprolol succinate (TOPROL-XL) 25 MG 24 hr tablet Take 1 tablet (25 mg total) by mouth daily.  . Multiple Vitamin (MULTIVITAMIN) tablet Take 1 tablet by mouth daily.  Marland Kitchen oxyCODONE-acetaminophen (PERCOCET/ROXICET) 5-325  MG per tablet Take 1 tablet by mouth every 4 (four) hours as needed for severe pain.  . sildenafil (VIAGRA) 100 MG tablet Take 1 tablet (100 mg total) by mouth daily as needed for erectile dysfunction.  Marland Kitchen warfarin (COUMADIN) 7.5 MG tablet Take 7.5 mg by mouth as directed.    No current facility-administered medications on file prior to visit.     Review of Systems Per HPI unless specifically indicated in ROS section     Objective:      BP 130/80   Pulse 80   Temp 98 F (36.7 C) (Oral)   Wt 218 lb 4 oz (99 kg)   BMI 35.23 kg/m   Wt Readings from Last 3 Encounters:  04/27/16 218 lb 4 oz (99 kg)  01/29/16 217 lb (98.4 kg)  11/16/15 221 lb 9.6 oz (100.5 kg)    Physical Exam  Constitutional: He appears well-developed and well-nourished. No distress.  HENT:  Mouth/Throat: Oropharynx is clear and moist. No oropharyngeal exudate.  Neck: Normal range of motion. Neck supple.  Cardiovascular: Normal rate, regular rhythm and intact distal pulses.   Murmur (2/6 SEM) heard. Pulmonary/Chest: Effort normal and breath sounds normal. No respiratory distress. He has no wheezes. He has no rales. He exhibits tenderness.  Marked point tender to palpation along left lower ribcage  Some soreness to palpation throughout sternum and bilateral upper costochondral junctions  Abdominal: Soft. Bowel sounds are normal. There is no tenderness.  Musculoskeletal: He exhibits no edema.  Lymphadenopathy:    He has no cervical adenopathy.  Skin: Skin is warm and dry. No rash noted.  No vesicular rash  Psychiatric: He has a normal mood and affect.  Nursing note and vitals reviewed.  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 I will await disability forms.      Hypertrophic obstructive cardiomyopathy ([place])    S/p maze procedure. To get new local cardiologist. Has f/u schedule with [facility] clinic next month.      Left-sided chest wall pain - Primary    Anticipate ribcage strain/bony contusion presumed from one of his recent falls although patient doesn't remember injury. Reassured. rec continued supportive care with tylenol, heating pad, will prescribe voltaren cream prn (careful with coumadin).  Update if not improving with treatment.   Not consistent with cardiac cause, shingles.  Given cardiac history, check EKG today.      Relevant Orders   DG Chest 2 View   DG Ribs Unilateral Left   EKG 12-Lead   Obesity, Class II, BMI 35-39.9, with comorbidity    Body mass index is 35.23 kg/m.       Relevant Medications   amphetamine-dextroamphetamine (ADDERALL) 20 MG tablet       Follow up plan: Return if symptoms worsen or fail to improve, for annual exam, prior fasting for blood work.  Ria Bush, MD

## 2016-04-27 NOTE — Assessment & Plan Note (Signed)
S/p maze procedure. To get new local cardiologist. Has f/u schedule with [facility] clinic next month.

## 2016-04-27 NOTE — Assessment & Plan Note (Addendum)
Anticipate ribcage strain/bony contusion presumed from one of his recent falls although patient doesn't remember injury. Reassured. rec continued supportive care with tylenol, heating pad, will prescribe voltaren cream prn (careful with coumadin).  Update if not improving with treatment.  Not consistent with cardiac cause, shingles.  Given cardiac history, check EKG today.

## 2016-04-27 NOTE — Progress Notes (Signed)
Pre visit review using our clinic review tool, if applicable. No additional management support is needed unless otherwise documented below in the visit note. 

## 2016-06-05 ENCOUNTER — Encounter: Payer: Self-pay | Admitting: Family Medicine

## 2016-06-07 ENCOUNTER — Other Ambulatory Visit: Payer: Self-pay | Admitting: Family Medicine

## 2016-06-07 DIAGNOSIS — D649 Anemia, unspecified: Secondary | ICD-10-CM

## 2016-06-07 DIAGNOSIS — Z1159 Encounter for screening for other viral diseases: Secondary | ICD-10-CM

## 2016-06-07 DIAGNOSIS — E559 Vitamin D deficiency, unspecified: Secondary | ICD-10-CM

## 2016-06-07 DIAGNOSIS — E785 Hyperlipidemia, unspecified: Secondary | ICD-10-CM

## 2016-06-07 DIAGNOSIS — R7303 Prediabetes: Secondary | ICD-10-CM

## 2016-06-07 DIAGNOSIS — R5381 Other malaise: Secondary | ICD-10-CM

## 2016-06-07 DIAGNOSIS — Z125 Encounter for screening for malignant neoplasm of prostate: Secondary | ICD-10-CM

## 2016-06-09 ENCOUNTER — Other Ambulatory Visit (INDEPENDENT_AMBULATORY_CARE_PROVIDER_SITE_OTHER): Payer: Commercial Managed Care - PPO

## 2016-06-09 DIAGNOSIS — Z125 Encounter for screening for malignant neoplasm of prostate: Secondary | ICD-10-CM

## 2016-06-09 DIAGNOSIS — R5381 Other malaise: Secondary | ICD-10-CM | POA: Diagnosis not present

## 2016-06-09 DIAGNOSIS — R7989 Other specified abnormal findings of blood chemistry: Secondary | ICD-10-CM | POA: Diagnosis not present

## 2016-06-09 DIAGNOSIS — R7303 Prediabetes: Secondary | ICD-10-CM | POA: Diagnosis not present

## 2016-06-09 DIAGNOSIS — E559 Vitamin D deficiency, unspecified: Secondary | ICD-10-CM

## 2016-06-09 DIAGNOSIS — D649 Anemia, unspecified: Secondary | ICD-10-CM | POA: Diagnosis not present

## 2016-06-09 DIAGNOSIS — E785 Hyperlipidemia, unspecified: Secondary | ICD-10-CM | POA: Diagnosis not present

## 2016-06-09 DIAGNOSIS — Z1159 Encounter for screening for other viral diseases: Secondary | ICD-10-CM

## 2016-06-09 LAB — COMPREHENSIVE METABOLIC PANEL
ALK PHOS: 64 U/L (ref 39–117)
ALT: 24 U/L (ref 0–53)
AST: 21 U/L (ref 0–37)
Albumin: 4.5 g/dL (ref 3.5–5.2)
BUN: 22 mg/dL (ref 6–23)
CO2: 31 meq/L (ref 19–32)
Calcium: 9.4 mg/dL (ref 8.4–10.5)
Chloride: 107 mEq/L (ref 96–112)
Creatinine, Ser: 1.11 mg/dL (ref 0.40–1.50)
GFR: 72.49 mL/min (ref >60.00)
GLUCOSE: 104 mg/dL — AB (ref 70–99)
POTASSIUM: 4.5 meq/L (ref 3.5–5.1)
SODIUM: 145 meq/L (ref 135–145)
TOTAL PROTEIN: 7.2 g/dL (ref 6.0–8.3)
Total Bilirubin: 0.5 mg/dL (ref 0.2–1.2)

## 2016-06-09 LAB — CBC WITH DIFFERENTIAL/PLATELET
BASOS PCT: 0.8 % (ref 0.0–3.0)
Basophils Absolute: 0.1 10*3/uL (ref 0.0–0.1)
EOS ABS: 0.2 10*3/uL (ref 0.0–0.7)
EOS PCT: 2 % (ref 0.0–5.0)
HCT: 44.1 % (ref 39.0–52.0)
HEMOGLOBIN: 15.1 g/dL (ref 13.0–17.0)
LYMPHS PCT: 26.2 % (ref 12.0–46.0)
Lymphs Abs: 2.3 10*3/uL (ref 0.7–4.0)
MCHC: 34.3 g/dL (ref 30.0–36.0)
MCV: 89.2 fl (ref 78.0–100.0)
Monocytes Absolute: 1 10*3/uL (ref 0.1–1.0)
Monocytes Relative: 11.1 % (ref 3.0–12.0)
NEUTROS ABS: 5.2 10*3/uL (ref 1.4–7.7)
NEUTROS PCT: 59.9 % (ref 43.0–77.0)
Platelets: 255 10*3/uL (ref 150.0–400.0)
RBC: 4.94 Mil/uL (ref 4.22–5.81)
RDW: 13.4 % (ref 11.5–15.5)
WBC: 8.6 10*3/uL (ref 4.0–10.5)

## 2016-06-09 LAB — LIPID PANEL
Cholesterol: 232 mg/dL — ABNORMAL HIGH (ref 0–200)
HDL: 38.5 mg/dL — ABNORMAL LOW (ref >39.00)
NonHDL: 193.02
Total CHOL/HDL Ratio: 6
Triglycerides: 264 mg/dL — ABNORMAL HIGH (ref 0.0–149.0)
VLDL: 52.8 mg/dL — ABNORMAL HIGH (ref 0.0–40.0)

## 2016-06-09 LAB — VITAMIN D 25 HYDROXY (VIT D DEFICIENCY, FRACTURES): VITD: 29.64 ng/mL — AB (ref 30.00–100.00)

## 2016-06-09 LAB — LDL CHOLESTEROL, DIRECT: Direct LDL: 140 mg/dL

## 2016-06-09 LAB — PSA: PSA: 1.16 ng/mL (ref 0.10–4.00)

## 2016-06-09 LAB — HEMOGLOBIN A1C: Hgb A1c MFr Bld: 5.4 % (ref 4.6–6.5)

## 2016-06-09 LAB — TSH: TSH: 0.64 u[IU]/mL (ref 0.35–4.50)

## 2016-06-10 LAB — HEPATITIS C ANTIBODY: HCV AB: NEGATIVE

## 2016-06-14 ENCOUNTER — Ambulatory Visit (INDEPENDENT_AMBULATORY_CARE_PROVIDER_SITE_OTHER): Payer: Commercial Managed Care - PPO | Admitting: Family Medicine

## 2016-06-14 ENCOUNTER — Encounter: Payer: Self-pay | Admitting: Family Medicine

## 2016-06-14 VITALS — BP 130/80 | HR 84 | Temp 97.6°F | Ht 66.0 in | Wt 221.5 lb

## 2016-06-14 DIAGNOSIS — I10 Essential (primary) hypertension: Secondary | ICD-10-CM

## 2016-06-14 DIAGNOSIS — E559 Vitamin D deficiency, unspecified: Secondary | ICD-10-CM

## 2016-06-14 DIAGNOSIS — Z0000 Encounter for general adult medical examination without abnormal findings: Secondary | ICD-10-CM | POA: Diagnosis not present

## 2016-06-14 DIAGNOSIS — Z952 Presence of prosthetic heart valve: Secondary | ICD-10-CM

## 2016-06-14 DIAGNOSIS — E785 Hyperlipidemia, unspecified: Secondary | ICD-10-CM

## 2016-06-14 DIAGNOSIS — N529 Male erectile dysfunction, unspecified: Secondary | ICD-10-CM | POA: Diagnosis not present

## 2016-06-14 DIAGNOSIS — F418 Other specified anxiety disorders: Secondary | ICD-10-CM | POA: Diagnosis not present

## 2016-06-14 DIAGNOSIS — E669 Obesity, unspecified: Secondary | ICD-10-CM

## 2016-06-14 DIAGNOSIS — I6930 Unspecified sequelae of cerebral infarction: Secondary | ICD-10-CM

## 2016-06-14 DIAGNOSIS — R5382 Chronic fatigue, unspecified: Secondary | ICD-10-CM

## 2016-06-14 DIAGNOSIS — G4733 Obstructive sleep apnea (adult) (pediatric): Secondary | ICD-10-CM

## 2016-06-14 DIAGNOSIS — H43812 Vitreous degeneration, left eye: Secondary | ICD-10-CM

## 2016-06-14 DIAGNOSIS — IMO0001 Reserved for inherently not codable concepts without codable children: Secondary | ICD-10-CM

## 2016-06-14 LAB — LYME ABY, WSTRN BLT IGG & IGM W/BANDS
B BURGDORFERI IGG ABS (IB): NEGATIVE
B BURGDORFERI IGM ABS (IB): NEGATIVE
LYME DISEASE 28 KD IGG: NONREACTIVE
LYME DISEASE 30 KD IGG: NONREACTIVE
LYME DISEASE 39 KD IGG: NONREACTIVE
LYME DISEASE 41 KD IGG: NONREACTIVE
LYME DISEASE 41 KD IGM: NONREACTIVE
LYME DISEASE 45 KD IGG: NONREACTIVE
LYME DISEASE 58 KD IGG: NONREACTIVE
LYME DISEASE 66 KD IGG: NONREACTIVE
LYME DISEASE 93 KD IGG: NONREACTIVE
Lyme Disease 18 kD IgG: NONREACTIVE
Lyme Disease 23 kD IgG: NONREACTIVE
Lyme Disease 23 kD IgM: NONREACTIVE
Lyme Disease 39 kD IgM: NONREACTIVE

## 2016-06-14 MED ORDER — SILDENAFIL CITRATE 100 MG PO TABS: 100.0000 mg | ORAL_TABLET | Freq: Every day | ORAL | 3 refills | Status: AC | PRN

## 2016-06-14 NOTE — Progress Notes (Signed)
BP 130/80   Pulse 84   Temp 97.6 F (36.4 C) (Oral)   Ht 5\' 6"  (1.676 m)   Wt 221 lb 8 oz (100.5 kg)   BMI 35.75 kg/m    CC: CPE Subjective:    Patient ID: Dylan Fletcher, male    DOB: March 21, 1961, 55 y.o.   MRN: [id number]  HPI: Dylan Fletcher is a 55 y.o. male presenting on 06/14/2016 for Annual Exam   Pleasant 55 yo s/p myomectomy and biatrial MAZE procedure for his HOCM with symptomatic atrial fibrillation at [facility] 08/2013. Surgery complicated by severe central mitral regurgitation leading to MVR with St Jude valve a few days later. On chronic anticoagulation with coumadin. Hospitalization also complicated by C diff infection s/p oral vancomycin course and mild CVA with residual aphasia postop. Had transient alteration of awareness s/p hospitalization 05/2014. Then hospitalized 01/2015 with acute on chronic diastolic CHF and cough - cough thought viral PNA related, treated with doxycycline course. Echo from 10/03/13 showing EF 60%, normally functioning prosthetic mitral valve, abnormal filling pressure.   Recent change from adderall to dextroamphetamine by neuropsych. Noticing night time headaches since he's started (for last 4 days). Upcoming rpt neuropsych testing.   Increased fatigue over last 6 months. Did have tick bite with bullseye rash, s/p doxycycline treatment. Wonders about chronic lyme disease - has requested lyme disease testing. Started Patent examiner.   Increasing ED - gets erections at night but not during the day. viagra was somewhat effective, asks about full dose. Occasional retrograde ejaculation. Denies dysuria, hematuria.   Preventative: COLONOSCOPY Date: 04/2014 mild diverticulosis, no polyps, rec rpt 5 yrs [facility]) Prostate cancer screening - requests continued screening. Nocturia x1.  Flu yearly Tdap 01/2013 Pneumovax 2016 Seat belt use discussed Sunscreen use discussed. No changing moles on skin.  Non smoker  Alcohol - 1  drink/mo  Caffeine: cutting back - 12-14 oz soda/day  Lives with wife, 3 cats  Occupation: Nurse, children's  Edu: Bachelor's degree  Activity: walking several times a week  Diet: good water, fruits/vegetables daily   Relevant past medical, surgical, family and social history reviewed and updated as indicated. Interim medical history since our last visit reviewed. Allergies and medications reviewed and updated. Current Outpatient Prescriptions on File Prior to Visit  Medication Sig  . acetaminophen (TYLENOL) 325 MG tablet Take 650 mg by mouth as needed.  Marland Kitchen amoxicillin (AMOXIL) 500 MG tablet Take 500 mg by mouth as directed. 1 BID; start 24 hours prior to dental procedure and 24 hours post dental procedure  . buPROPion (WELLBUTRIN XL) 150 MG 24 hr tablet Take 150 mg by mouth daily.  . cetirizine (ZYRTEC) 10 MG tablet Take 10 mg by mouth daily.  . Cholecalciferol (VITAMIN D) 2000 UNITS CAPS Take 1 capsule by mouth daily.  Marland Kitchen donepezil (ARICEPT) 5 MG tablet Take 2 tablets (10 mg total) by mouth at bedtime.  Marland Kitchen escitalopram (LEXAPRO) 10 MG tablet Take 10 mg by mouth daily.  . famotidine (PEPCID) 20 MG tablet Take 20 mg by mouth daily.  . furosemide (LASIX) 20 MG tablet Take 1 tablet (20 mg total) by mouth daily. With extra prn weight gain  . Glucosamine Sulfate 500 MG CAPS Take 1 capsule by mouth daily.   . metoprolol succinate (TOPROL-XL) 25 MG 24 hr tablet Take 1 tablet (25 mg total) by mouth daily.  . Multiple Vitamin (MULTIVITAMIN) tablet Take 1 tablet by mouth daily.  Marland Kitchen oxyCODONE-acetaminophen (PERCOCET/ROXICET) 5-325 MG per tablet Take  1 tablet by mouth every 4 (four) hours as needed for severe pain.  Marland Kitchen warfarin (COUMADIN) 7.5 MG tablet Take 7.5 mg by mouth as directed.    No current facility-administered medications on file prior to visit.     Review of Systems  Constitutional: Negative for activity change, appetite change, chills, fatigue, fever and unexpected weight change.  HENT:  Negative for hearing loss.   Eyes: Negative for visual disturbance.  Respiratory: Negative for cough, chest tightness, shortness of breath and wheezing.   Cardiovascular: Negative for chest pain, palpitations and leg swelling.  Gastrointestinal: Positive for constipation and diarrhea. Negative for abdominal distention, abdominal pain, blood in stool, nausea and vomiting.  Genitourinary: Negative for difficulty urinating and hematuria.  Musculoskeletal: Negative for arthralgias, myalgias and neck pain.  Skin: Negative for rash.  Neurological: Positive for dizziness (orthostatic) and headaches (see HPI). Negative for seizures and syncope.  Hematological: Negative for adenopathy. Bruises/bleeds easily (on coumadin).  Psychiatric/Behavioral: Negative for dysphoric mood. The patient is not nervous/anxious.    Per HPI unless specifically indicated in ROS section     Objective:    BP 130/80   Pulse 84   Temp 97.6 F (36.4 C) (Oral)   Ht 5\' 6"  (1.676 m)   Wt 221 lb 8 oz (100.5 kg)   BMI 35.75 kg/m   Wt Readings from Last 3 Encounters:  06/14/16 221 lb 8 oz (100.5 kg)  04/27/16 218 lb 4 oz (99 kg)  01/29/16 217 lb (98.4 kg)    Physical Exam  Constitutional: He is oriented to person, place, and time. He appears well-developed and well-nourished. No distress.  HENT:  Head: Normocephalic and atraumatic.  Right Ear: Hearing, tympanic membrane, external ear and ear canal normal.  Left Ear: Hearing, tympanic membrane, external ear and ear canal normal.  Nose: Nose normal.  Mouth/Throat: Uvula is midline, oropharynx is clear and moist and mucous membranes are normal. No oropharyngeal exudate, posterior oropharyngeal edema or posterior oropharyngeal erythema.  Eyes: Conjunctivae and EOM are normal. Pupils are equal, round, and reactive to light. No scleral icterus.  Neck: Normal range of motion. Neck supple. No thyromegaly present.  Cardiovascular: Normal rate, regular rhythm, normal heart  sounds and intact distal pulses.   No murmur heard. Pulses:      Radial pulses are 2+ on the right side, and 2+ on the left side.  Pulmonary/Chest: Effort normal and breath sounds normal. No respiratory distress. He has no wheezes. He has no rales.  Abdominal: Soft. Bowel sounds are normal. He exhibits no distension and no mass. There is no tenderness. There is no rebound and no guarding. Hernia confirmed negative in the right inguinal area and confirmed negative in the left inguinal area.  Genitourinary: Rectum normal, prostate normal, testes normal and penis normal. Rectal exam shows no external hemorrhoid, no internal hemorrhoid, no fissure, no mass, no tenderness and anal tone normal. Prostate is not enlarged (20gm) and not tender. Right testis shows no mass, no swelling and no tenderness. Right testis is descended. Left testis shows no mass, no swelling and no tenderness. Left testis is descended. Circumcised.  Musculoskeletal: Normal range of motion. He exhibits no edema.  Lymphadenopathy:    He has no cervical adenopathy.       Right: No inguinal adenopathy present.       Left: No inguinal adenopathy present.  Neurological: He is alert and oriented to person, place, and time.  CN grossly intact, station and gait intact  Skin: Skin  is warm and dry. No rash noted.  Psychiatric: He has a normal mood and affect. His behavior is normal. Judgment and thought content normal.  Nursing note and vitals reviewed.  Results for orders placed or performed in visit on 06/09/16  Lipid panel  Result Value Ref Range   Cholesterol 232 (H) 0 - 200 mg/dL   Triglycerides 264.0 (H) 0.0 - 149.0 mg/dL   HDL 38.50 (L) >39.00 mg/dL   VLDL 52.8 (H) 0.0 - 40.0 mg/dL   Total CHOL/HDL Ratio 6    NonHDL 193.02   Comprehensive metabolic panel  Result Value Ref Range   Sodium 145 135 - 145 mEq/L   Potassium 4.5 3.5 - 5.1 mEq/L   Chloride 107 96 - 112 mEq/L   CO2 31 19 - 32 mEq/L   Glucose, Bld 104 (H) 70 - 99  mg/dL   BUN 22 6 - 23 mg/dL   Creatinine, Ser 1.11 0.40 - 1.50 mg/dL   Total Bilirubin 0.5 0.2 - 1.2 mg/dL   Alkaline Phosphatase 64 39 - 117 U/L   AST 21 0 - 37 U/L   ALT 24 0 - 53 U/L   Total Protein 7.2 6.0 - 8.3 g/dL   Albumin 4.5 3.5 - 5.2 g/dL   Calcium 9.4 8.4 - 10.5 mg/dL   GFR 72.49 >60.00 mL/min  Hemoglobin A1c  Result Value Ref Range   Hgb A1c MFr Bld 5.4 4.6 - 6.5 %  PSA  Result Value Ref Range   PSA 1.16 0.10 - 4.00 ng/mL  CBC with Differential/Platelet  Result Value Ref Range   WBC 8.6 4.0 - 10.5 K/uL   RBC 4.94 4.22 - 5.81 Mil/uL   Hemoglobin 15.1 13.0 - 17.0 g/dL   HCT 44.1 39.0 - 52.0 %   MCV 89.2 78.0 - 100.0 fl   MCHC 34.3 30.0 - 36.0 g/dL   RDW 13.4 11.5 - 15.5 %   Platelets 255.0 150.0 - 400.0 K/uL   Neutrophils Relative % 59.9 43.0 - 77.0 %   Lymphocytes Relative 26.2 12.0 - 46.0 %   Monocytes Relative 11.1 3.0 - 12.0 %   Eosinophils Relative 2.0 0.0 - 5.0 %   Basophils Relative 0.8 0.0 - 3.0 %   Neutro Abs 5.2 1.4 - 7.7 K/uL   Lymphs Abs 2.3 0.7 - 4.0 K/uL   Monocytes Absolute 1.0 0.1 - 1.0 K/uL   Eosinophils Absolute 0.2 0.0 - 0.7 K/uL   Basophils Absolute 0.1 0.0 - 0.1 K/uL  VITAMIN D 25 Hydroxy (Vit-D Deficiency, Fractures)  Result Value Ref Range   VITD 29.64 (L) 30.00 - 100.00 ng/mL  TSH  Result Value Ref Range   TSH 0.64 0.35 - 4.50 uIU/mL  Hepatitis C antibody  Result Value Ref Range   HCV Ab NEGATIVE NEGATIVE  LDL cholesterol, direct  Result Value Ref Range   Direct LDL 140.0 mg/dL  Lyme Winferd Humphrey, Wstrn. Blt. IgG & IgM w/bands  Result Value Ref Range   B burgdorferi IgG Abs (IB) Negative    Lyme Disease 18 kD IgG Non Reactive    Lyme Disease 23 kD IgG Non Reactive    Lyme Disease 28 kD IgG Non Reactive    Lyme Disease 30 kD IgG Non Reactive    Lyme Disease 39 kD IgG Non Reactive    Lyme Disease 41 kD IgG Non Reactive    Lyme Disease 45 kD IgG Non Reactive    Lyme Disease 58 kD IgG Non Reactive  Lyme Disease 66 kD IgG Non Reactive     Lyme Disease 93 kD IgG Non Reactive    B burgdorferi IgM Abs (IB) Negative    Lyme Disease 23 kD IgM Non Reactive    Lyme Disease 39 kD IgM Non Reactive    Lyme Disease 41 kD IgM Non Reactive       Assessment & Plan:   Problem List Items Addressed This Visit    Anxiety associated with depression    Stable period on current regimen of lexapro and wellbutrin.       Chronic fatigue    Ongoing fatigue, pending second opinion from [facility] cardiology/neuropsych.  Lyme disease titers negative today.  Pt actually states change from adderall to dextroamphetamine has improved fatigue. Initially HAs with change, but they are dying down. Changed 4 days go.       ED (erectile dysfunction)    With some retrograde ejaculation, exam benign today. Pt states he's had normal testosterone eval in the past (not in our records). Reviewed viagra dosing. rec trial 100mg  daily, coupon provided today. If no improvement noted, discussed urology referral.       H/O mitral valve replacement    Continue coumadin therapy.      Healthcare maintenance - Primary    Preventative protocols reviewed and updated unless pt declined. Discussed healthy diet and lifestyle.       History of cerebrovascular accident (CVA) with residual deficit    Ongoing post stroke cognitive impairment - has applied for disability. Pending neurology appt with [facility] for 2nd opinion.       HLD (hyperlipidemia)    Reviewed elevated readings with patient. Off statin per cards, told no CAD. Will defer statin decision to pt and cardiology.       Relevant Medications   sildenafil (VIAGRA) 100 MG tablet   HTN (hypertension)    Chronic, stable. Continue current regimen.       Relevant Medications   sildenafil (VIAGRA) 100 MG tablet   Obesity, Class II, BMI 35-39.9, with comorbidity    Discussed healthy diet and lifestyle changes to affect sustainable weight loss. Reviewed staying active in setting of chronic fatigue.       Relevant  Medications   dextroamphetamine (DEXEDRINE) 15 MG 24 hr capsule   OSA (obstructive sleep apnea)    Continues CPAP.       PVD (posterior vitreous detachment), left eye    Sees ophtho regularly.      Vitamin D deficiency    Encouraged ongoing replacement.          Follow up plan: Return in about 6 months (around 12/13/2016) for follow up visit.  Ria Bush, MD

## 2016-06-14 NOTE — Assessment & Plan Note (Signed)
Sees ophtho regularly.  

## 2016-06-14 NOTE — Assessment & Plan Note (Addendum)
With some retrograde ejaculation, exam benign today. Pt states he's had normal testosterone eval in the past (not in our records). Reviewed viagra dosing. rec trial 100mg  daily, coupon provided today. If no improvement noted, discussed urology referral.

## 2016-06-14 NOTE — Assessment & Plan Note (Signed)
Reviewed elevated readings with patient. Off statin per cards, told no CAD. Will defer statin decision to pt and cardiology.

## 2016-06-14 NOTE — Assessment & Plan Note (Signed)
Preventative protocols reviewed and updated unless pt declined. Discussed healthy diet and lifestyle.  

## 2016-06-14 NOTE — Assessment & Plan Note (Signed)
-   Continues CPAP  

## 2016-06-14 NOTE — Assessment & Plan Note (Signed)
Ongoing fatigue, pending second opinion from [facility] cardiology/neuropsych.  Lyme disease titers negative today.  Pt actually states change from adderall to dextroamphetamine has improved fatigue. Initially HAs with change, but they are dying down. Changed 4 days go.

## 2016-06-14 NOTE — Assessment & Plan Note (Signed)
Ongoing post stroke cognitive impairment - has applied for disability. Pending neurology appt with [facility] for 2nd opinion.

## 2016-06-14 NOTE — Assessment & Plan Note (Signed)
Continue coumadin therapy. 

## 2016-06-14 NOTE — Assessment & Plan Note (Signed)
Encouraged ongoing replacement.

## 2016-06-14 NOTE — Assessment & Plan Note (Addendum)
Discussed healthy diet and lifestyle changes to affect sustainable weight loss. Reviewed staying active in setting of chronic fatigue.

## 2016-06-14 NOTE — Patient Instructions (Addendum)
Good to see you today. Trial viagra 100mg  - coupon provided today. If not helping, let me know for referral to urologist.  Let neuropsych know about headaches you're having since starting dextroamphetamine.  We will be in touch with lyme disease testing results.  Return as needed or in 6 months for follow up visit  Health Maintenance, Male A healthy lifestyle and preventative care can promote health and wellness.  Maintain regular health, dental, and eye exams.  Eat a healthy diet. Foods like vegetables, fruits, whole grains, low-fat dairy products, and lean protein foods contain the nutrients you need and are low in calories. Decrease your intake of foods high in solid fats, added sugars, and salt. Get information about a proper diet from your health care provider, if necessary.  Regular physical exercise is one of the most important things you can do for your health. Most adults should get at least 150 minutes of moderate-intensity exercise (any activity that increases your heart rate and causes you to sweat) each week. In addition, most adults need muscle-strengthening exercises on 2 or more days a week.   Maintain a healthy weight. The body mass index (BMI) is a screening tool to identify possible weight problems. It provides an estimate of body fat based on height and weight. Your health care provider can find your BMI and can help you achieve or maintain a healthy weight. For males 20 years and older:  A BMI below 18.5 is considered underweight.  A BMI of 18.5 to 24.9 is normal.  A BMI of 25 to 29.9 is considered overweight.  A BMI of 30 and above is considered obese.  Maintain normal blood lipids and cholesterol by exercising and minimizing your intake of saturated fat. Eat a balanced diet with plenty of fruits and vegetables. Blood tests for lipids and cholesterol should begin at age 26 and be repeated every 5 years. If your lipid or cholesterol levels are high, you are over age 76,  or you are at high risk for heart disease, you may need your cholesterol levels checked more frequently.Ongoing high lipid and cholesterol levels should be treated with medicines if diet and exercise are not working.  If you smoke, find out from your health care provider how to quit. If you do not use tobacco, do not start.  Lung cancer screening is recommended for adults aged 32-80 years who are at high risk for developing lung cancer because of a history of smoking. A yearly low-dose CT scan of the lungs is recommended for people who have at least a 30-pack-year history of smoking and are current smokers or have quit within the past 15 years. A pack year of smoking is smoking an average of 1 pack of cigarettes a day for 1 year (for example, a 30-pack-year history of smoking could mean smoking 1 pack a day for 30 years or 2 packs a day for 15 years). Yearly screening should continue until the smoker has stopped smoking for at least 15 years. Yearly screening should be stopped for people who develop a health problem that would prevent them from having lung cancer treatment.  If you choose to drink alcohol, do not have more than 2 drinks per day. One drink is considered to be 12 oz (360 mL) of beer, 5 oz (150 mL) of wine, or 1.5 oz (45 mL) of liquor.  Avoid the use of street drugs. Do not share needles with anyone. Ask for help if you need support or instructions about  stopping the use of drugs.  High blood pressure causes heart disease and increases the risk of stroke. High blood pressure is more likely to develop in:  People who have blood pressure in the end of the normal range (100-139/85-89 mm Hg).  People who are overweight or obese.  People who are African American.  If you are 46-32 years of age, have your blood pressure checked every 3-5 years. If you are 9 years of age or older, have your blood pressure checked every year. You should have your blood pressure measured twice-once when you  are at a hospital or clinic, and once when you are not at a hospital or clinic. Record the average of the two measurements. To check your blood pressure when you are not at a hospital or clinic, you can use:  An automated blood pressure machine at a pharmacy.  A home blood pressure monitor.  If you are 95-48 years old, ask your health care provider if you should take aspirin to prevent heart disease.  Diabetes screening involves taking a blood sample to check your fasting blood sugar level. This should be done once every 3 years after age 42 if you are at a normal weight and without risk factors for diabetes. Testing should be considered at a younger age or be carried out more frequently if you are overweight and have at least 1 risk factor for diabetes.  Colorectal cancer can be detected and often prevented. Most routine colorectal cancer screening begins at the age of 57 and continues through age 69. However, your health care provider may recommend screening at an earlier age if you have risk factors for colon cancer. On a yearly basis, your health care provider may provide home test kits to check for hidden blood in the stool. A small camera at the end of a tube may be used to directly examine the colon (sigmoidoscopy or colonoscopy) to detect the earliest forms of colorectal cancer. Talk to your health care provider about this at age 62 when routine screening begins. A direct exam of the colon should be repeated every 5-10 years through age 60, unless early forms of precancerous polyps or small growths are found.  People who are at an increased risk for hepatitis B should be screened for this virus. You are considered at high risk for hepatitis B if:  You were born in a country where hepatitis B occurs often. Talk with your health care provider about which countries are considered high risk.  Your parents were born in a high-risk country and you have not received a shot to protect against  hepatitis B (hepatitis B vaccine).  You have HIV or AIDS.  You use needles to inject street drugs.  You live with, or have sex with, someone who has hepatitis B.  You are a man who has sex with other men (MSM).  You get hemodialysis treatment.  You take certain medicines for conditions like cancer, organ transplantation, and autoimmune conditions.  Hepatitis C blood testing is recommended for all people born from [birth year] through 1967 and any individual with known risk factors for hepatitis C.  Healthy men should no longer receive prostate-specific antigen (PSA) blood tests as part of routine cancer screening. Talk to your health care provider about prostate cancer screening.  Testicular cancer screening is not recommended for adolescents or adult males who have no symptoms. Screening includes self-exam, a health care provider exam, and other screening tests. Consult with your health care provider about  any symptoms you have or any concerns you have about testicular cancer.  Practice safe sex. Use condoms and avoid high-risk sexual practices to reduce the spread of sexually transmitted infections (STIs).  You should be screened for STIs, including gonorrhea and chlamydia if:  You are sexually active and are younger than 24 years.  You are older than 24 years, and your health care provider tells you that you are at risk for this type of infection.  Your sexual activity has changed since you were last screened, and you are at an increased risk for chlamydia or gonorrhea. Ask your health care provider if you are at risk.  If you are at risk of being infected with HIV, it is recommended that you take a prescription medicine daily to prevent HIV infection. This is called pre-exposure prophylaxis (PrEP). You are considered at risk if:  You are a man who has sex with other men (MSM).  You are a heterosexual man who is sexually active with multiple partners.  You take drugs by  injection.  You are sexually active with a partner who has HIV.  Talk with your health care provider about whether you are at high risk of being infected with HIV. If you choose to begin PrEP, you should first be tested for HIV. You should then be tested every 3 months for as long as you are taking PrEP.  Use sunscreen. Apply sunscreen liberally and repeatedly throughout the day. You should seek shade when your shadow is shorter than you. Protect yourself by wearing long sleeves, pants, a wide-brimmed hat, and sunglasses year round whenever you are outdoors.  Tell your health care provider of new moles or changes in moles, especially if there is a change in shape or color. Also, tell your health care provider if a mole is larger than the size of a pencil eraser.  A one-time screening for abdominal aortic aneurysm (AAA) and surgical repair of large AAAs by ultrasound is recommended for men aged 52-75 years who are current or former smokers.  Stay current with your vaccines (immunizations). This information is not intended to replace advice given to you by your health care provider. Make sure you discuss any questions you have with your health care provider. Document Released: 12/02/2007 Document Revised: 06/26/2014 Document Reviewed: 03/09/2015 Elsevier Interactive Patient Education  2017 [employer].

## 2016-06-14 NOTE — Assessment & Plan Note (Signed)
Stable period on current regimen of lexapro and wellbutrin.

## 2016-06-14 NOTE — Progress Notes (Signed)
Pre visit review using our clinic review tool, if applicable. No additional management support is needed unless otherwise documented below in the visit note. 

## 2016-06-14 NOTE — Assessment & Plan Note (Signed)
Chronic, stable. Continue current regimen. 

## 2016-07-01 ENCOUNTER — Encounter: Payer: Self-pay | Admitting: Family Medicine

## 2016-07-01 DIAGNOSIS — R5382 Chronic fatigue, unspecified: Secondary | ICD-10-CM

## 2016-07-13 ENCOUNTER — Ambulatory Visit
Admission: EM | Admit: 2016-07-13 | Discharge: 2016-07-13 | Disposition: A | Discharge status: Home / Self Care | Payer: Commercial Managed Care - PPO | Attending: Family Medicine | Admitting: Family Medicine

## 2016-07-13 DIAGNOSIS — H6982 Other specified disorders of Eustachian tube, left ear: Secondary | ICD-10-CM

## 2016-07-13 DIAGNOSIS — H9202 Otalgia, left ear: Secondary | ICD-10-CM

## 2016-07-13 DIAGNOSIS — Q178 Other specified congenital malformations of ear: Secondary | ICD-10-CM

## 2016-07-13 MED ORDER — FEXOFENADINE-PSEUDOEPHED ER 180-240 MG PO TB24: 1.0000 | ORAL_TABLET | Freq: Every day | ORAL | 0 refills | Status: DC

## 2016-07-13 MED ORDER — FLUTICASONE PROPIONATE 50 MCG/ACT NA SUSP: 2.0000 | Freq: Every day | NASAL | 0 refills | Status: DC

## 2016-07-13 MED ORDER — AMOXICILLIN-POT CLAVULANATE 875-125 MG PO TABS: 1.0000 | ORAL_TABLET | Freq: Two times a day (BID) | ORAL | 0 refills | Status: DC

## 2016-07-13 NOTE — ED Triage Notes (Signed)
Patient complains of left ear "clogging". Patient states that he doesn't really have any pain. Patient states that this has been constant 10 days.

## 2016-07-13 NOTE — ED Provider Notes (Signed)
[facility]    CSN: [id number] Arrival date & time: 07/13/16  1201     History   Chief Complaint Chief Complaint  Patient presents with  . Otalgia    left    HPI Dylan Fletcher is a 56 y.o. male.   Patient's 56 year old white male with complaint of pressure in his left ear. States he's had a ear infection this time is had pain impressions left ear for about 10 days. He contacted his PCP but was going to be seen until Monday. Because of the left the time these have pain impressions left ear he decided to be seen there recommend he go to the urgent care. Now even though he's had a history of recurrent ear infection he is free to share that he's had a stroke he's had marked valve replacement he's had numerous hospitalizations his INR is over 4 and his PCP is working with him in titrating his Coumadin dose. He does inform me that he does have a poor memory and has had trouble with remembering certain things. Because this had a stroke sometimes focus on 20 tablets and current issues Mr. Childers. He states normally his wife comes with him to provide history but she is not here today.   The history is provided by the patient. The history is limited by the condition of the patient. No language interpreter was used.  Otalgia  Location:  Left Quality:  Pressure Onset quality:  Gradual Timing:  Constant Chronicity:  New Context: not direct blow, not elevation change, not foreign body in ear, not loud noise, not recent URI and not water in ear   Relieved by:  Nothing Worsened by:  Nothing Ineffective treatments:  None tried Risk factors: no chronic ear infection and no prior ear surgery     Past Medical History:  Diagnosis Date  . ADD (attention deficit disorder)    sees psych in [place], [place] (meds from there)  . Allergic rhinitis    Ragweed, mold, mildew  . Anxiety associated with depression    sees psych in [place], [place] (meds from there)  . Arrhythmia   .  Claustrophobia   . Colon polyp 04/2009   rectal tubular adenoma, rec rpt 3 yrs  . CVA (cerebral infarction) 08/2013   L thalamic lacunar infarct post CVTS surgery, s/p neuropsychology testing, completed speech therapy. no ASA 2/2 no CAD hx  . ED (erectile dysfunction)   . H/O mitral valve replacement 08/2013   St Jude, needs abx ppx, completed cardiac rehab 12/2013  . History of asthma    as child  . HLD (hyperlipidemia)   . HTN (hypertension)   . Hypertrophic obstructive cardiomyopathy(425.11)    Cards (Dr. Jodell Cipro [facility] - need abx prophylaxis - now established with [facility] Dr. Rosetta Posner [phone]) and Dr. Shearon Stalls and Octavia Heir s/p myomectomy, now cardiac rehab [facility] 09/2013  . LBBB (left bundle branch block) 08/2013   after myomectomy  . Migraine with aura   . Need for prophylactic antibiotic   . Obesity    saw nutritionist 12/18/2014  . OSA (obstructive sleep apnea)    CPAP at night, up to 16 mmHg (Dr Nevada Crane [facility])  . Paroxysmal atrial fibrillation ([place]) 04/2013, 05/2013   with RVR; s/p multiple hospitalizations, spontaneous conversion, referred to Dr. Manuella Ghazi EP, then recurrence - failed sotalol, multaq (both with spont conversions)  . Transient alteration of awareness 05/2014   during hospitalization pending EEG [facility])  .  Vitamin D deficiency   . Warfarin anticoagulation    goal INR 2.5-3.5    Patient Active Problem List   Diagnosis Date Noted  . Dizziness 06/02/2015  . PVD (posterior vitreous detachment), left eye 03/19/2015  . Globus sensation 12/02/2014  . Transient alteration of awareness 05/19/2014  . History of adenomatous polyp of colon 03/10/2014  . Chronic fatigue 12/18/2013  . Aphasia due to recent cerebral infarction 09/12/2013  . History of cerebrovascular accident (CVA) with residual deficit 09/12/2013  . Warfarin anticoagulation   . H/O mitral valve replacement 08/17/2013  . LBBB (left bundle branch block) 08/17/2013  .  History of atrial fibrillation   . ED (erectile dysfunction)   . Healthcare maintenance 02/07/2012  . Obesity, Class II, BMI 35-39.9, with comorbidity   . Vitamin D deficiency   . Hypertrophic obstructive cardiomyopathy ([place])   . OSA (obstructive sleep apnea)   . Migraine with aura   . HLD (hyperlipidemia)   . HTN (hypertension)   . Allergic rhinitis   . Anxiety associated with depression     Past Surgical History:  Procedure Laterality Date  . CARDIAC CATHETERIZATION  2014   done for chest pain, no plaque buildup  . COLONOSCOPY  04/2009   rectal tubular adenoma x1, rpt 3 yrs  . COLONOSCOPY  04/2014   mild diverticulosis, no polyps, rec rpt 5 yrs [facility])  . [place]; 1971  . EYE SURGERY Right 04/2015   for PVD  . hospitalization  04/2014   subtherapeutic INR, heparin bridge  . INGUINAL HERNIA REPAIR  1987  . KNEE ARTHROSCOPY  2006; 2007   right  . MITRAL VALVE REPLACEMENT  08/2013   St Jude MV for severe central mitral regurg  . MYOMECTOMY  08/22/2013   with MAZE procedure [facility] (Drs [place])  . PFTs  07/2013   FVC 79%, FEV1 72%, ratio 0.71 - mild obstruction, o/w normal  . VASECTOMY  1992  . WISDOM TOOTH EXTRACTION  1977       Home Medications    Prior to Admission medications   Medication Sig Start Date End Date Taking? Authorizing Provider  acetaminophen (TYLENOL) 325 MG tablet Take 650 mg by mouth as needed.   Yes Historical Provider, MD  amoxicillin (AMOXIL) 500 MG tablet Take 500 mg by mouth as directed. 1 BID; start 24 hours prior to dental procedure and 24 hours post dental procedure   Yes Historical Provider, MD  buPROPion (WELLBUTRIN XL) 150 MG 24 hr tablet Take 150 mg by mouth daily.   Yes Historical Provider, MD  cetirizine (ZYRTEC) 10 MG tablet Take 10 mg by mouth daily.   Yes Historical Provider, MD  Cholecalciferol (VITAMIN D) 2000 UNITS CAPS Take 1 capsule by mouth daily.   Yes Historical Provider, MD  dextroamphetamine  (DEXEDRINE) 15 MG 24 hr capsule Take 15 mg by mouth 3 (three) times daily.   Yes Historical Provider, MD  donepezil (ARICEPT) 5 MG tablet Take 2 tablets (10 mg total) by mouth at bedtime. 03/19/15  Yes Ria Bush, MD  escitalopram (LEXAPRO) 10 MG tablet Take 10 mg by mouth daily.   Yes Historical Provider, MD  famotidine (PEPCID) 20 MG tablet Take 20 mg by mouth daily.   Yes Historical Provider, MD  furosemide (LASIX) 20 MG tablet Take 1 tablet (20 mg total) by mouth daily. With extra prn weight gain 07/01/15  Yes Ria Bush, MD  Glucosamine Sulfate 500 MG CAPS Take 1 capsule by mouth  daily.    Yes Historical Provider, MD  metoprolol succinate (TOPROL-XL) 25 MG 24 hr tablet Take 1 tablet (25 mg total) by mouth daily. 10/24/13  Yes Ria Bush, MD  Multiple Vitamin (MULTIVITAMIN) tablet Take 1 tablet by mouth daily.   Yes Historical Provider, MD  oxyCODONE-acetaminophen (PERCOCET/ROXICET) 5-325 MG per tablet Take 1 tablet by mouth every 4 (four) hours as needed for severe pain.   Yes Historical Provider, MD  sildenafil (VIAGRA) 100 MG tablet Take 1 tablet (100 mg total) by mouth daily as needed for erectile dysfunction. 06/14/16  Yes Ria Bush, MD  warfarin (COUMADIN) 7.5 MG tablet Take 7.5 mg by mouth as directed.    Yes Historical Provider, MD  amoxicillin-clavulanate (AUGMENTIN) 875-125 MG tablet Take 1 tablet by mouth 2 (two) times daily. 07/13/16   Frederich Cha, MD  fexofenadine-pseudoephedrine (ALLEGRA-D ALLERGY & CONGESTION) 180-240 MG 24 hr tablet Take 1 tablet by mouth daily. 07/13/16   Frederich Cha, MD  fluticasone (FLONASE) 50 MCG/ACT nasal spray Place 2 sprays into both nostrils daily. 07/13/16   Frederich Cha, MD    Family History Family History  Problem Relation Age of Onset  . Alcohol abuse Brother   . Cancer Mother 78    breast  . Ulcers Mother   . Heart disease Mother     HOCM  . Psoriasis Father   . Cancer Maternal Grandfather     colon  . Coronary artery  disease Paternal Grandfather 82    MI  . Diabetes Neg Hx   . Stroke Neg Hx     Social History Social History  Substance Use Topics  . Smoking status: Never Smoker  . Smokeless tobacco: Never Used  . Alcohol use Yes     Comment: 1-2 drinks/weekly     Allergies   Nicardipine hcl; Nitroglycerin; and Calcium channel blockers   Review of Systems Review of Systems  Unable to perform ROS: Mental status change  HENT: Positive for ear pain.      Physical Exam Triage Vital Signs ED Triage Vitals  Enc Vitals Group     BP 07/13/16 1244 131/77     Pulse Rate 07/13/16 1244 65     Resp 07/13/16 1244 17     Temp 07/13/16 1244 98.3 F (36.8 C)     Temp Source 07/13/16 1244 Oral     SpO2 07/13/16 1244 100 %     Weight 07/13/16 1242 226 lb (102.5 kg)     Height 07/13/16 1242 5\' 6"  (1.676 m)     Head Circumference --      Peak Flow --      Pain Score 07/13/16 1244 0     Pain Loc --      Pain Edu? --      Excl. in [place]? --    No data found.   Updated Vital Signs BP 131/77 (BP Location: Left Arm)   Pulse 65   Temp 98.3 F (36.8 C) (Oral)   Resp 17   Ht 5\' 6"  (1.676 m)   Wt 226 lb (102.5 kg)   SpO2 100%   BMI 36.48 kg/m   Visual Acuity Right Eye Distance:   Left Eye Distance:   Bilateral Distance:    Right Eye Near:   Left Eye Near:    Bilateral Near:     Physical Exam  Constitutional: He appears well-developed. He appears distressed.  HENT:  Head: Normocephalic and atraumatic.  Right Ear: Hearing, external ear and ear canal  normal. Tympanic membrane is erythematous.  Left Ear: Hearing, external ear and ear canal normal. Tympanic membrane is bulging.  Nose: Mucosal edema present.  Mouth/Throat: Uvula is midline, oropharynx is clear and moist and mucous membranes are normal.  Eyes: EOM are normal. Pupils are equal, round, and reactive to light.  Neck: Neck supple.  Musculoskeletal: Normal range of motion.  Neurological: He is alert. No cranial nerve deficit.    Skin: Skin is warm.  Psychiatric: He has a normal mood and affect.  Vitals reviewed.    UC Treatments / Results  Labs (all labs ordered are listed, but only abnormal results are displayed) Labs Reviewed - No data to display  EKG  EKG Interpretation None       Radiology No results found.  Procedures Procedures (including critical care time)  Medications Ordered in UC Medications - No data to display   Initial Impression / Assessment and Plan / UC Course  I have reviewed the triage vital signs and the nursing notes.  Pertinent labs & imaging results that were available during my care of the patient were reviewed by me and considered in my medical decision making (see chart for details).     Valsalva maneuver did not help any with treat for eustachian tube dysfunction and will place him on Allegra-D 1 tablet daily Flonase nasal spray keeping his head down when he administered the spray and because of the left time that his symptoms of been going on we'll place him on Augmentin 875 on One  tablet twice a day for 10 days. Follow-up PCP if needed.    Final Clinical Impressions(s) / UC Diagnoses   Final diagnoses:  Left ear pain  Eustachian tube anomaly  Eustachian tube dysfunction, left    New Prescriptions Discharge Medication List as of 07/13/2016  1:58 PM    START taking these medications   Details  amoxicillin-clavulanate (AUGMENTIN) 875-125 MG tablet Take 1 tablet by mouth 2 (two) times daily., Starting Thu 07/13/2016, Normal    fexofenadine-pseudoephedrine (ALLEGRA-D ALLERGY & CONGESTION) 180-240 MG 24 hr tablet Take 1 tablet by mouth daily., Starting Thu 07/13/2016, Normal    fluticasone (FLONASE) 50 MCG/ACT nasal spray Place 2 sprays into both nostrils daily., Starting Thu 07/13/2016, Normal         Frederich Cha, MD 07/13/16 [phone]

## 2016-07-16 NOTE — Addendum Note (Signed)
Addended by: Ria Bush on: 07/16/2016 12:57 PM   Modules accepted: Orders

## 2016-07-21 ENCOUNTER — Ambulatory Visit: Payer: Commercial Managed Care - PPO | Admitting: Family Medicine

## 2016-07-21 ENCOUNTER — Ambulatory Visit (INDEPENDENT_AMBULATORY_CARE_PROVIDER_SITE_OTHER): Payer: Commercial Managed Care - PPO | Admitting: Family Medicine

## 2016-07-21 ENCOUNTER — Encounter: Payer: Self-pay | Admitting: Family Medicine

## 2016-07-21 VITALS — BP 132/78 | HR 79 | Temp 98.1°F | Resp 16 | Ht 66.0 in | Wt 224.2 lb

## 2016-07-21 DIAGNOSIS — H6502 Acute serous otitis media, left ear: Secondary | ICD-10-CM | POA: Diagnosis not present

## 2016-07-21 NOTE — Progress Notes (Signed)
BP 132/78   Pulse 79   Temp 98.1 F (36.7 C) (Oral)   Resp 16   Ht 5\' 6"  (1.676 m)   Wt 224 lb 4 oz (101.7 kg)   SpO2 98%   BMI 36.19 kg/m    CC: L ear clogged Subjective:    Patient ID: Dylan Fletcher, male    DOB: 04/28/1961, 56 y.o.   MRN: [id number]  HPI: Majdi Moeder is a 56 y.o. male presenting on 07/21/2016 for Cerumen Impaction (Left ear)   Seen at [facility] last week and dx with AOM and ETD and treated with augmentin 10d course, allegra D, flonase. 3 wk h/o muffled hearing out of left ear that has persisted.   No fevers/chills, congestion, ST, PNdrainage, cough.   He did recently see ENT Dr Nevada Crane.   Relevant past medical, surgical, family and social history reviewed and updated as indicated. Interim medical history since our last visit reviewed. Allergies and medications reviewed and updated. Current Outpatient Prescriptions on File Prior to Visit  Medication Sig  . acetaminophen (TYLENOL) 325 MG tablet Take 650 mg by mouth as needed.  Marland Kitchen amoxicillin (AMOXIL) 500 MG tablet Take 500 mg by mouth as directed. 1 BID; start 24 hours prior to dental procedure and 24 hours post dental procedure  . amoxicillin-clavulanate (AUGMENTIN) 875-125 MG tablet Take 1 tablet by mouth 2 (two) times daily.  Marland Kitchen buPROPion (WELLBUTRIN XL) 150 MG 24 hr tablet Take 150 mg by mouth daily.  . cetirizine (ZYRTEC) 10 MG tablet Take 10 mg by mouth daily.  . Cholecalciferol (VITAMIN D) 2000 UNITS CAPS Take 1 capsule by mouth daily.  Marland Kitchen dextroamphetamine (DEXEDRINE) 15 MG 24 hr capsule Take 15 mg by mouth 3 (three) times daily.  Marland Kitchen donepezil (ARICEPT) 5 MG tablet Take 2 tablets (10 mg total) by mouth at bedtime.  Marland Kitchen escitalopram (LEXAPRO) 10 MG tablet Take 10 mg by mouth daily.  . famotidine (PEPCID) 20 MG tablet Take 20 mg by mouth daily.  . fexofenadine-pseudoephedrine (ALLEGRA-D ALLERGY & CONGESTION) 180-240 MG 24 hr tablet Take 1 tablet by mouth daily.  . fluticasone (FLONASE) 50 MCG/ACT nasal  spray Place 2 sprays into both nostrils daily.  . furosemide (LASIX) 20 MG tablet Take 1 tablet (20 mg total) by mouth daily. With extra prn weight gain  . Glucosamine Sulfate 500 MG CAPS Take 1 capsule by mouth daily.   . metoprolol succinate (TOPROL-XL) 25 MG 24 hr tablet Take 1 tablet (25 mg total) by mouth daily.  . Multiple Vitamin (MULTIVITAMIN) tablet Take 1 tablet by mouth daily.  Marland Kitchen oxyCODONE-acetaminophen (PERCOCET/ROXICET) 5-325 MG per tablet Take 1 tablet by mouth every 4 (four) hours as needed for severe pain.  . sildenafil (VIAGRA) 100 MG tablet Take 1 tablet (100 mg total) by mouth daily as needed for erectile dysfunction.  Marland Kitchen warfarin (COUMADIN) 7.5 MG tablet Take 7.5 mg by mouth as directed.    No current facility-administered medications on file prior to visit.     Review of Systems Per HPI unless specifically indicated in ROS section     Objective:    BP 132/78   Pulse 79   Temp 98.1 F (36.7 C) (Oral)   Resp 16   Ht 5\' 6"  (1.676 m)   Wt 224 lb 4 oz (101.7 kg)   SpO2 98%   BMI 36.19 kg/m   Wt Readings from Last 3 Encounters:  07/21/16 224 lb 4 oz (101.7 kg)  07/13/16 226 lb (102.5  kg)  06/14/16 221 lb 8 oz (100.5 kg)    Physical Exam  Constitutional: He appears well-developed and well-nourished. No distress.  HENT:  Head: Normocephalic and atraumatic.  Right Ear: Hearing, tympanic membrane, external ear and ear canal normal.  Left Ear: Tympanic membrane, external ear and ear canal normal. Decreased hearing is noted.  Nose: Nose normal. No mucosal edema or rhinorrhea. Right sinus exhibits no maxillary sinus tenderness and no frontal sinus tenderness. Left sinus exhibits no maxillary sinus tenderness and no frontal sinus tenderness.  Mouth/Throat: Uvula is midline, oropharynx is clear and moist and mucous membranes are normal. No oropharyngeal exudate, posterior oropharyngeal edema, posterior oropharyngeal erythema or tonsillar abscesses.  Eyes: Conjunctivae  and EOM are normal. Pupils are equal, round, and reactive to light. No scleral icterus.  Neck: Normal range of motion. Neck supple.  Lymphadenopathy:    He has no cervical adenopathy.  Neurological:  Weber test - no lateralization Rinne test - abnormal on left, normal on right  Skin: Skin is warm and dry. No rash noted.  Psychiatric: He has a normal mood and affect.  Nursing note and vitals reviewed.     Assessment & Plan:   Problem List Items Addressed This Visit    Acute serous otitis media, left ear - Primary    Anticipate residual conductive hearing loss from recent AOM - with serous otitis.  Supportive care as per instructions.  Update if persistent symptoms past 3-4 wks for ENT referral. Pt agrees with plan.           Follow up plan: Return if symptoms worsen or fail to improve.  Ria Bush, MD

## 2016-07-21 NOTE — Progress Notes (Signed)
Pre-visit discussion using our clinic review tool. No additional management support is needed unless otherwise documented below in the visit note.  

## 2016-07-21 NOTE — Patient Instructions (Signed)
Muffled hearing of left ear may be from residual fluid in the middle ear - this can happen after an ear infection and can take 3-4 weeks to fully resolve. Let me know if ongoing muffled hearing past 3-4 weeks for referral to ENT.

## 2016-07-21 NOTE — Assessment & Plan Note (Addendum)
Anticipate residual conductive hearing loss from recent AOM - with serous otitis.  Supportive care as per instructions.  Update if persistent symptoms past 3-4 wks for ENT referral. Pt agrees with plan.

## 2016-07-27 ENCOUNTER — Other Ambulatory Visit (INDEPENDENT_AMBULATORY_CARE_PROVIDER_SITE_OTHER): Payer: Commercial Managed Care - PPO

## 2016-07-27 DIAGNOSIS — R5382 Chronic fatigue, unspecified: Secondary | ICD-10-CM | POA: Diagnosis not present

## 2016-07-27 LAB — CK: CK TOTAL: 116 U/L (ref 7–232)

## 2016-07-27 LAB — TESTOSTERONE: Testosterone: 235.89 ng/dL — ABNORMAL LOW (ref 300.00–890.00)

## 2016-07-30 ENCOUNTER — Other Ambulatory Visit: Payer: Self-pay | Admitting: Family Medicine

## 2016-07-30 DIAGNOSIS — R7989 Other specified abnormal findings of blood chemistry: Secondary | ICD-10-CM

## 2016-07-30 DIAGNOSIS — E291 Testicular hypofunction: Secondary | ICD-10-CM | POA: Insufficient documentation

## 2016-08-02 ENCOUNTER — Other Ambulatory Visit (INDEPENDENT_AMBULATORY_CARE_PROVIDER_SITE_OTHER): Payer: Commercial Managed Care - PPO

## 2016-08-02 DIAGNOSIS — E291 Testicular hypofunction: Secondary | ICD-10-CM | POA: Diagnosis not present

## 2016-08-02 DIAGNOSIS — R7989 Other specified abnormal findings of blood chemistry: Secondary | ICD-10-CM

## 2016-08-03 LAB — FSH/LH
FSH: 9 m[IU]/mL — ABNORMAL HIGH (ref 1.6–8.0)
LH: 2.9 m[IU]/mL (ref 1.5–9.3)

## 2016-08-03 LAB — PROLACTIN: Prolactin: 9.1 ng/mL (ref 2.0–18.0)

## 2016-08-07 DIAGNOSIS — E785 Hyperlipidemia, unspecified: Secondary | ICD-10-CM | POA: Insufficient documentation

## 2016-08-07 LAB — TESTOS,TOTAL,FREE AND SHBG (FEMALE)
SEX HORMONE BINDING GLOB.: 32 nmol/L (ref 22–77)
Testosterone, Free: 54.9 pg/mL (ref 35.0–155.0)
Testosterone,Total,LC/MS/MS: 327 ng/dL (ref 250–1100)

## 2016-08-10 ENCOUNTER — Encounter: Payer: Self-pay | Admitting: Family Medicine

## 2016-08-10 DIAGNOSIS — R5382 Chronic fatigue, unspecified: Secondary | ICD-10-CM

## 2016-08-10 DIAGNOSIS — R7989 Other specified abnormal findings of blood chemistry: Secondary | ICD-10-CM

## 2016-08-14 NOTE — Addendum Note (Signed)
Addended by: Ria Bush on: 08/14/2016 07:52 AM   Modules accepted: Orders

## 2016-08-16 ENCOUNTER — Telehealth: Payer: Self-pay | Admitting: Family Medicine

## 2016-08-16 NOTE — Telephone Encounter (Signed)
Kim I spoke with pt he was confused as to why he needed endocrinology referral  He has that you call his wife to discuss this with her.  I let pt know that I will fax patient's notes to [facility] endocrinology they will contact pt to schedule appointment  Thanks

## 2016-08-17 NOTE — Telephone Encounter (Signed)
Endo referral placed for low initial testosterone level with fatigue.

## 2016-08-17 NOTE — Telephone Encounter (Signed)
Patient's wife was notified and was actually aware of the reasoning for the referral. Shirlean Mylar- she asks that you have the referral person at [facility] call her for the appointment. Her number is in his chart.

## 2016-08-17 NOTE — Telephone Encounter (Signed)
Pt aware [facility] clinic will contact him

## 2016-08-25 ENCOUNTER — Telehealth: Payer: Self-pay | Admitting: Family Medicine

## 2016-08-25 DIAGNOSIS — H6502 Acute serous otitis media, left ear: Secondary | ICD-10-CM

## 2016-08-25 NOTE — Telephone Encounter (Signed)
Pt stated his ear is not doing any better he would like a referral to an ent.  Pt would like to go to [place]

## 2016-08-25 NOTE — Telephone Encounter (Signed)
Noted  Referral placed.

## 2016-08-30 ENCOUNTER — Telehealth: Payer: Self-pay | Admitting: Family Medicine

## 2016-08-30 ENCOUNTER — Telehealth: Payer: Self-pay

## 2016-08-30 NOTE — Telephone Encounter (Signed)
Anderson Malta speech therapist at [facility] at [facility] left v/m requesting faxed order to [phone] to do eval for outpt speech therapy. Pt has appt on 08/31/16 for speech eval.

## 2016-08-30 NOTE — Telephone Encounter (Signed)
Rx written and in Kim's box. 

## 2016-08-30 NOTE — Telephone Encounter (Signed)
Error

## 2016-08-31 NOTE — Telephone Encounter (Signed)
Rx faxed

## 2016-08-31 NOTE — Telephone Encounter (Signed)
I took everything out of my box and this wasn't in there?

## 2016-08-31 NOTE — Telephone Encounter (Signed)
It's on a small written Rx - in your box.

## 2016-09-06 DIAGNOSIS — IMO0001 Reserved for inherently not codable concepts without codable children: Secondary | ICD-10-CM | POA: Insufficient documentation

## 2016-09-06 DIAGNOSIS — S62172B Displaced fracture of trapezium [larger multangular], left wrist, initial encounter for open fracture: Secondary | ICD-10-CM | POA: Insufficient documentation

## 2016-09-25 ENCOUNTER — Other Ambulatory Visit: Payer: Self-pay | Admitting: Otolaryngology

## 2016-09-25 DIAGNOSIS — H918X2 Other specified hearing loss, left ear: Secondary | ICD-10-CM

## 2016-09-25 DIAGNOSIS — IMO0001 Reserved for inherently not codable concepts without codable children: Secondary | ICD-10-CM

## 2016-10-04 ENCOUNTER — Ambulatory Visit
Admission: RE | Admit: 2016-10-04 | Discharge: 2016-10-04 | Disposition: A | Discharge status: Home / Self Care | Payer: Commercial Managed Care - PPO | Source: Ambulatory Visit | Attending: Otolaryngology | Admitting: Otolaryngology

## 2016-10-04 DIAGNOSIS — H918X9 Other specified hearing loss, unspecified ear: Secondary | ICD-10-CM | POA: Diagnosis present

## 2016-10-04 DIAGNOSIS — H918X2 Other specified hearing loss, left ear: Secondary | ICD-10-CM

## 2016-10-04 DIAGNOSIS — I638 Other cerebral infarction: Secondary | ICD-10-CM | POA: Diagnosis not present

## 2016-10-04 DIAGNOSIS — IMO0001 Reserved for inherently not codable concepts without codable children: Secondary | ICD-10-CM

## 2016-10-04 MED ORDER — GADOBENATE DIMEGLUMINE 529 MG/ML IV SOLN
20.0000 mL | Freq: Once | INTRAVENOUS | Status: AC | PRN
  Administered 2016-10-04: 20 mL via INTRAVENOUS

## 2016-12-13 ENCOUNTER — Ambulatory Visit (INDEPENDENT_AMBULATORY_CARE_PROVIDER_SITE_OTHER): Payer: Commercial Managed Care - PPO | Admitting: Family Medicine

## 2016-12-13 ENCOUNTER — Encounter: Payer: Self-pay | Admitting: Family Medicine

## 2016-12-13 VITALS — BP 110/78 | HR 71 | Temp 97.9°F | Wt 216.5 lb

## 2016-12-13 DIAGNOSIS — H8102 Meniere's disease, left ear: Secondary | ICD-10-CM | POA: Diagnosis not present

## 2016-12-13 DIAGNOSIS — R5382 Chronic fatigue, unspecified: Secondary | ICD-10-CM

## 2016-12-13 DIAGNOSIS — E669 Obesity, unspecified: Secondary | ICD-10-CM

## 2016-12-13 DIAGNOSIS — I421 Obstructive hypertrophic cardiomyopathy: Secondary | ICD-10-CM

## 2016-12-13 DIAGNOSIS — I69320 Aphasia following cerebral infarction: Secondary | ICD-10-CM

## 2016-12-13 DIAGNOSIS — E291 Testicular hypofunction: Secondary | ICD-10-CM | POA: Diagnosis not present

## 2016-12-13 NOTE — Assessment & Plan Note (Addendum)
Congratulated on weight loss. Reviewed low salt diet.

## 2016-12-13 NOTE — Patient Instructions (Addendum)
Ask to have latest ENT note sent.  Send me copy of latest neurocognitive testing results.  You are doing well today.  Return in 1 month for lab only visit (8am, mid testosterone cycle).  Return in 6 months for physical.

## 2016-12-13 NOTE — Assessment & Plan Note (Signed)
Thought cochlear by [facility] Dr Richardson Landry - I have requested latest OV to review. Reviewed MRI in system.

## 2016-12-13 NOTE — Assessment & Plan Note (Signed)
Stable period. S/p maze procedure at [place] clinic - looking to establish locally with [facility] clinic.

## 2016-12-13 NOTE — Assessment & Plan Note (Signed)
Secondary to obesity. Has established with endo - but she is planning on moving - will establish with new endo at [facility].  Will return in 1 mo for labs.

## 2016-12-13 NOTE — Assessment & Plan Note (Signed)
Ongoing memory deficit - I have requested latest neurocognitive evaluation.

## 2016-12-13 NOTE — Progress Notes (Signed)
BP 110/78   Pulse 71   Temp 97.9 F (36.6 C) (Oral)   Wt 216 lb 8 oz (98.2 kg)   SpO2 100%   BMI 34.94 kg/m    CC: 6 mo f/u visit Subjective:    Patient ID: Dylan Fletcher, male    DOB: 17-Apr-1961, 56 y.o.   MRN: [id number]  HPI: Dylan Fletcher is a 56 y.o. male presenting on 12/13/2016 for Follow-up (87mo)    Has established with endocrinology Dr Graceann Congress with trial of IM testosterone which has been effective for hypogonadism likely due to obesity. He is currently receiving 50mg  Qweekly. Cream was too expensive. Persistent fatigue but significant improvement noted in mood, sex drive.   He continues to receive regular speech therapy for cognitive communication deficit after stroke.   Saw [facility] - dx with meniere's s/p MRI. Has been prescribed low sodium diet. This has helped his vertigo attacks.   Obesity - healthy diet changes, low sodium diet. 8 lb weight loss over last 4 months.   Had recent neuropsychological cognitive testing for disability - forms filled out by neurologist.   Relevant past medical, surgical, family and social history reviewed and updated as indicated. Interim medical history since our last visit reviewed. Allergies and medications reviewed and updated. Outpatient Medications Prior to Visit  Medication Sig Dispense Refill  . acetaminophen (TYLENOL) 325 MG tablet Take 650 mg by mouth as needed.    Marland Kitchen amoxicillin (AMOXIL) 500 MG tablet Take 500 mg by mouth as directed. 1 BID; start 24 hours prior to dental procedure and 24 hours post dental procedure    . buPROPion (WELLBUTRIN XL) 150 MG 24 hr tablet Take 150 mg by mouth daily.    . cetirizine (ZYRTEC) 10 MG tablet Take 10 mg by mouth daily.    . Cholecalciferol (VITAMIN D) 2000 UNITS CAPS Take 1 capsule by mouth daily.    Marland Kitchen dextroamphetamine (DEXEDRINE) 15 MG 24 hr capsule Take 15 mg by mouth 3 (three) times daily.    Marland Kitchen donepezil (ARICEPT) 5 MG tablet Take 2 tablets (10 mg total) by mouth at  bedtime.    Marland Kitchen escitalopram (LEXAPRO) 10 MG tablet Take 10 mg by mouth daily.    . famotidine (PEPCID) 20 MG tablet Take 20 mg by mouth daily.    . furosemide (LASIX) 20 MG tablet Take 1 tablet (20 mg total) by mouth daily. With extra prn weight gain    . Glucosamine Sulfate 500 MG CAPS Take 1 capsule by mouth daily.     . metoprolol succinate (TOPROL-XL) 25 MG 24 hr tablet Take 1 tablet (25 mg total) by mouth daily.    . Multiple Vitamin (MULTIVITAMIN) tablet Take 1 tablet by mouth daily.    Marland Kitchen oxyCODONE-acetaminophen (PERCOCET/ROXICET) 5-325 MG per tablet Take 1 tablet by mouth every 4 (four) hours as needed for severe pain.    . sildenafil (VIAGRA) 100 MG tablet Take 1 tablet (100 mg total) by mouth daily as needed for erectile dysfunction. 10 tablet 3  . warfarin (COUMADIN) 5 MG tablet Take 5 mg by mouth as directed.     Marland Kitchen amoxicillin-clavulanate (AUGMENTIN) 875-125 MG tablet Take 1 tablet by mouth 2 (two) times daily. 20 tablet 0  . fexofenadine-pseudoephedrine (ALLEGRA-D ALLERGY & CONGESTION) 180-240 MG 24 hr tablet Take 1 tablet by mouth daily. 30 tablet 0  . fluticasone (FLONASE) 50 MCG/ACT nasal spray Place 2 sprays into both nostrils daily. 16 g 0   No facility-administered medications  prior to visit.      Per HPI unless specifically indicated in ROS section below Review of Systems     Objective:    BP 110/78   Pulse 71   Temp 97.9 F (36.6 C) (Oral)   Wt 216 lb 8 oz (98.2 kg)   SpO2 100%   BMI 34.94 kg/m   Wt Readings from Last 3 Encounters:  12/13/16 216 lb 8 oz (98.2 kg)  07/21/16 224 lb 4 oz (101.7 kg)  07/13/16 226 lb (102.5 kg)    Physical Exam  Constitutional: He appears well-developed and well-nourished. No distress.  HENT:  Head: Normocephalic and atraumatic.  Mouth/Throat: Oropharynx is clear and moist. No oropharyngeal exudate.  Cardiovascular: Normal rate, regular rhythm, normal heart sounds and intact distal pulses.   No murmur heard. Stable click after  heart surgery  Pulmonary/Chest: Effort normal and breath sounds normal. No respiratory distress. He has no wheezes. He has no rales.  Musculoskeletal: He exhibits no edema.  Skin: Skin is warm and dry. No rash noted.  Psychiatric: He has a normal mood and affect.  Nursing note and vitals reviewed.  Results for orders placed or performed in visit on 08/02/16  Testos,Total,Free and SHBG (Male)  Result Value Ref Range   Testosterone,Total,LC/MS/MS 327 250 - 1,100 ng/dL   Testosterone, Free 54.9 35.0 - 155.0 pg/mL   Sex Hormone Binding Glob. 32 22 - 77 nmol/L  FSH/LH  Result Value Ref Range   FSH 9.0 (H) 1.6 - 8.0 mIU/mL   LH 2.9 1.5 - 9.3 mIU/mL  Prolactin  Result Value Ref Range   Prolactin 9.1 2.0 - 18.0 ng/mL      Assessment & Plan:  Over 25 minutes were spent face-to-face with the patient during this encounter and >50% of that time was spent on counseling and coordination of care  Problem List Items Addressed This Visit    Aphasia due to recent cerebral infarction    Ongoing memory deficit - I have requested latest neurocognitive evaluation.       Chronic fatigue    Ongoing despite testosterone replacement.       Hypertrophic obstructive cardiomyopathy (HCC)    Stable period. S/p maze procedure at [facility] - looking to establish locally with [facility] clinic.       Hypogonadism in male - Primary    Secondary to obesity. Has established with endo - but she is planning on moving - will establish with new endo at [facility].  Will return in 1 mo for labs.       Meniere disease, left    Thought cochlear by [facility] Dr Richardson Landry - I have requested latest OV to review. Reviewed MRI in system.       Obesity, Class I, BMI 30.0-34.9 (see actual BMI)    Congratulated on weight loss. Reviewed low salt diet.           Follow up plan: Return in about 6 months (around 06/14/2017) for annual exam, prior fasting for blood work.  Ria Bush, MD

## 2016-12-13 NOTE — Assessment & Plan Note (Signed)
Ongoing despite testosterone replacement.

## 2016-12-23 ENCOUNTER — Encounter: Payer: Self-pay | Admitting: Family Medicine

## 2016-12-23 DIAGNOSIS — I69319 Unspecified symptoms and signs involving cognitive functions following cerebral infarction: Secondary | ICD-10-CM | POA: Insufficient documentation

## 2017-01-10 ENCOUNTER — Other Ambulatory Visit: Payer: Self-pay | Admitting: Family Medicine

## 2017-01-10 DIAGNOSIS — E785 Hyperlipidemia, unspecified: Secondary | ICD-10-CM

## 2017-01-10 DIAGNOSIS — E291 Testicular hypofunction: Secondary | ICD-10-CM

## 2017-01-15 ENCOUNTER — Other Ambulatory Visit (INDEPENDENT_AMBULATORY_CARE_PROVIDER_SITE_OTHER): Payer: Commercial Managed Care - PPO

## 2017-01-15 DIAGNOSIS — E785 Hyperlipidemia, unspecified: Secondary | ICD-10-CM

## 2017-01-15 DIAGNOSIS — I10 Essential (primary) hypertension: Secondary | ICD-10-CM | POA: Diagnosis not present

## 2017-01-15 DIAGNOSIS — E291 Testicular hypofunction: Secondary | ICD-10-CM

## 2017-01-15 LAB — BASIC METABOLIC PANEL
BUN: 23 mg/dL (ref 6–23)
CALCIUM: 9.4 mg/dL (ref 8.4–10.5)
CO2: 28 meq/L (ref 19–32)
CREATININE: 1.22 mg/dL (ref 0.40–1.50)
Chloride: 102 mEq/L (ref 96–112)
GFR: 64.86 mL/min (ref >60.00)
Glucose, Bld: 93 mg/dL (ref 70–99)
Potassium: 4.7 mEq/L (ref 3.5–5.1)
Sodium: 136 mEq/L (ref 135–145)

## 2017-01-15 LAB — LIPID PANEL
Cholesterol: 192 mg/dL (ref 0–200)
HDL: 34.2 mg/dL — AB (ref >39.00)
NONHDL: 158.27
TRIGLYCERIDES: 205 mg/dL — AB (ref 0.0–149.0)
Total CHOL/HDL Ratio: 6
VLDL: 41 mg/dL — AB (ref 0.0–40.0)

## 2017-01-15 LAB — LDL CHOLESTEROL, DIRECT: Direct LDL: 129 mg/dL

## 2017-01-15 NOTE — Addendum Note (Signed)
Addended by: Ellamae Sia on: 01/15/2017 09:53 AM   Modules accepted: Orders

## 2017-01-19 LAB — TESTOS,TOTAL,FREE AND SHBG (FEMALE)
SEX HORMONE BINDING GLOB.: 29 nmol/L (ref 22–77)
TESTOSTERONE,TOTAL,LC/MS/MS: 407 ng/dL (ref 250–1100)
Testosterone, Free: 58.2 pg/mL (ref 35.0–155.0)

## 2017-02-05 ENCOUNTER — Telehealth: Payer: Self-pay | Admitting: Family Medicine

## 2017-02-05 NOTE — Telephone Encounter (Signed)
Pt has appt with Dr Danise Mina on 02/06/17 at 10:15.

## 2017-02-05 NOTE — Telephone Encounter (Signed)
[facility]  Patient Name: Dylan Fletcher  DOB: 01-21-1961    Initial Comment Pt got hit by a hornet or wasp above the ankle- the area around it is swollen, with dark red and bruising   Nurse Assessment  Nurse: Harlow Mares, RN, Suanne Marker Date/Time (Eastern Time): 02/05/2017 8:18:39 AM  Confirm and document reason for call. If symptomatic, describe symptoms. ---Pt got hit by a hornet or wasp above the ankle- the area around it is swollen, with dark red and bruising. Was stung on Friday afternoon. Visible bruising began 24 hour later. Reports that he is on blood thinners due to heart valve replacement.  Does the patient have any new or worsening symptoms? ---Yes  Will a triage be completed? ---Yes  Related visit to physician within the last 2 weeks? ---Yes  Does the PT have any chronic conditions? (i.e. diabetes, asthma, etc.) ---Yes  List chronic conditions. ---hx heart valve replacement (takes blood thinners);  Is this a behavioral health or substance abuse call? ---No     Guidelines    Guideline Title Affirmed Question Affirmed Notes  Bee or Yellow Jacket Sting [1] Red or very tender (to touch) area AND [2] started over 24 hours after the sting    Final Disposition User   See Physician within [place], RN, Suanne Marker    Comments  MD appt made with [name] at the [facility] office for 02/06/17 @ 10:30am.   Referrals  REFERRED TO PCP OFFICE   Disagree/Comply: [facility]

## 2017-02-06 ENCOUNTER — Encounter: Payer: Self-pay | Admitting: Family Medicine

## 2017-02-06 ENCOUNTER — Ambulatory Visit (INDEPENDENT_AMBULATORY_CARE_PROVIDER_SITE_OTHER): Payer: BLUE CROSS/BLUE SHIELD | Admitting: Family Medicine

## 2017-02-06 VITALS — BP 126/78 | HR 78 | Temp 97.7°F | Wt 213.5 lb

## 2017-02-06 DIAGNOSIS — T63481A Toxic effect of venom of other arthropod, accidental (unintentional), initial encounter: Secondary | ICD-10-CM

## 2017-02-06 NOTE — Assessment & Plan Note (Signed)
Reassured patient based on exam.  Supportive care as per instructions.  Not consistent with anaphylactoid reaction.

## 2017-02-06 NOTE — Progress Notes (Signed)
BP 126/78   Pulse 78   Temp 97.7 F (36.5 C) (Oral)   Wt 213 lb 8 oz (96.8 kg)   SpO2 99%   BMI 34.46 kg/m    CC: check insect bite Subjective:    Patient ID: Dylan Fletcher, male    DOB: Aug 21, 1960, 56 y.o.   MRN: [id number]  HPI: Dylan Fletcher is a 56 y.o. male presenting on 02/06/2017 for Insect Bite (8/17)   DOI: 02/02/2017 Insect sting (hornet or wasp) to L anterior ankle.  2 days later skin surrounding sting with redness and swelling.  Has tried keeping leg elevated, initially ice.  He started amoxicillin 500mg  qid x 1 day (had some at home for dental ppx).  Today insect sting site looking better.   Denies fevers/chills, nausea.   Has previously reacted to wasp stings - once in [place] had wasp sting s/p [facility] treatment with abx and meds. Without improvement seen next day at ER with treatment of IVF and IV abx.   Relevant past medical, surgical, family and social history reviewed and updated as indicated. Interim medical history since our last visit reviewed. Allergies and medications reviewed and updated. Outpatient Medications Prior to Visit  Medication Sig Dispense Refill  . acetaminophen (TYLENOL) 325 MG tablet Take 650 mg by mouth as needed.    Marland Kitchen amoxicillin (AMOXIL) 500 MG tablet Take 500 mg by mouth as directed. 1 BID; start 24 hours prior to dental procedure and 24 hours post dental procedure    . buPROPion (WELLBUTRIN XL) 150 MG 24 hr tablet Take 150 mg by mouth daily.    . cetirizine (ZYRTEC) 10 MG tablet Take 10 mg by mouth daily.    . Cholecalciferol (VITAMIN D) 2000 UNITS CAPS Take 1 capsule by mouth daily.    Marland Kitchen dextroamphetamine (DEXEDRINE) 15 MG 24 hr capsule Take 15 mg by mouth 3 (three) times daily.    Marland Kitchen donepezil (ARICEPT) 5 MG tablet Take 2 tablets (10 mg total) by mouth at bedtime.    Marland Kitchen escitalopram (LEXAPRO) 10 MG tablet Take 10 mg by mouth daily.    . famotidine (PEPCID) 20 MG tablet Take 20 mg by mouth daily.    . furosemide (LASIX) 20 MG  tablet Take 1 tablet (20 mg total) by mouth daily. With extra prn weight gain    . Glucosamine Sulfate 500 MG CAPS Take 1 capsule by mouth daily.     . metoprolol succinate (TOPROL-XL) 25 MG 24 hr tablet Take 1 tablet (25 mg total) by mouth daily.    . Multiple Vitamin (MULTIVITAMIN) tablet Take 1 tablet by mouth daily.    Marland Kitchen oxyCODONE-acetaminophen (PERCOCET/ROXICET) 5-325 MG per tablet Take 1 tablet by mouth every 4 (four) hours as needed for severe pain.    . sildenafil (VIAGRA) 100 MG tablet Take 1 tablet (100 mg total) by mouth daily as needed for erectile dysfunction. 10 tablet 3  . warfarin (COUMADIN) 5 MG tablet Take 5 mg by mouth as directed.     . testosterone cypionate (DEPOTESTOTERONE CYPIONATE) 100 MG/ML injection Inject 50 mg into the muscle once a week.      No facility-administered medications prior to visit.      Per HPI unless specifically indicated in ROS section below Review of Systems     Objective:    BP 126/78   Pulse 78   Temp 97.7 F (36.5 C) (Oral)   Wt 213 lb 8 oz (96.8 kg)   SpO2 99%  BMI 34.46 kg/m   Wt Readings from Last 3 Encounters:  02/06/17 213 lb 8 oz (96.8 kg)  12/13/16 216 lb 8 oz (98.2 kg)  07/21/16 224 lb 4 oz (101.7 kg)    Physical Exam  Constitutional: He appears well-developed and well-nourished. No distress.  Musculoskeletal: He exhibits edema (with tenderness surrounding insect sting).  2+ DP bilaterally  Skin: Skin is warm and dry. Ecchymosis noted.  Ecchymosis around insect sting site L anterior ankle  Nursing note and vitals reviewed.     Assessment & Plan:   Problem List Items Addressed This Visit    Local reaction to insect sting - Primary    Reassured patient based on exam.  Supportive care as per instructions.  Not consistent with anaphylactoid reaction.           Follow up plan: Return if symptoms worsen or fail to improve.  Ria Bush, MD

## 2017-02-06 NOTE — Patient Instructions (Signed)
You had local reaction to inset sting.  Elevate leg as much as able for the next week and use warm compresses several times daily to the area. May use tylenol as needed for pain. This should continue to heal well.  Watch for streaking redness, fevers, or malaise and call me if this happens.

## 2017-03-22 DIAGNOSIS — R7989 Other specified abnormal findings of blood chemistry: Secondary | ICD-10-CM | POA: Insufficient documentation

## 2017-04-10 ENCOUNTER — Encounter: Payer: Self-pay | Admitting: Family Medicine

## 2017-05-16 ENCOUNTER — Encounter: Payer: Self-pay | Admitting: Family Medicine

## 2017-05-17 ENCOUNTER — Ambulatory Visit: Payer: BLUE CROSS/BLUE SHIELD | Admitting: Family Medicine

## 2017-05-17 ENCOUNTER — Encounter: Payer: Self-pay | Admitting: Family Medicine

## 2017-05-17 VITALS — BP 120/70 | HR 69 | Temp 98.4°F | Wt 207.0 lb

## 2017-05-17 DIAGNOSIS — I725 Aneurysm of other precerebral arteries: Secondary | ICD-10-CM | POA: Diagnosis not present

## 2017-05-17 DIAGNOSIS — J22 Unspecified acute lower respiratory infection: Secondary | ICD-10-CM | POA: Diagnosis not present

## 2017-05-17 MED ORDER — GUAIFENESIN-CODEINE 100-10 MG/5ML PO SYRP: 5.0000 mL | ORAL_SOLUTION | Freq: Two times a day (BID) | ORAL | 0 refills | Status: DC | PRN

## 2017-05-17 NOTE — Telephone Encounter (Signed)
Pt was seen this morning.

## 2017-05-17 NOTE — Progress Notes (Addendum)
BP 120/70 (BP Location: Left Arm, Patient Position: Sitting, Cuff Size: Normal)   Pulse 69   Temp 98.4 F (36.9 C) (Oral)   Wt 207 lb (93.9 kg)   SpO2 99%   BMI 33.41 kg/m    CC: URI Subjective:    Patient ID: Dylan Fletcher, male    DOB: 01-Sep-1960, 56 y.o.   MRN: [id number]  HPI: Dylan Fletcher is a 56 y.o. male presenting on 05/17/2017 for Cough (mild. Started 05/15/17. Concerned due to wife having pneumonia)   Here with friend because wife is sick at home.   4d h/o cough with congestion associated with malaise and fatigue. Yesterday started feeling better then last night felt worse again. Headaches, some left earache. Occasional head congestion worse with CPAP machine. Nonproductive cough. Mild body aches.   Denies fevers, tooth pain, chest congestion, ST or PNdrainage. No dyspnea or wheezing.   Wife sick at home with ?PNA.  Non smoker.  Childhood asthma.  So far treating with cough drops.   Recent visit to [facility] ER with stroke like symptoms - increased confusion. Imaging was ok. He was found to have small 52mm basilar tip aneurysm. EEG to r/o frontal lobe seizures - WNL 04/2017. [facility] neuro f/u with Dr Phineas Douglas scheduled for 06/01/2017.   Relevant past medical, surgical, family and social history reviewed and updated as indicated. Interim medical history since our last visit reviewed. Allergies and medications reviewed and updated. Outpatient Medications Prior to Visit  Medication Sig Dispense Refill  . acetaminophen (TYLENOL) 325 MG tablet Take 650 mg by mouth as needed.    Marland Kitchen amoxicillin (AMOXIL) 500 MG tablet Take 500 mg by mouth as directed. 1 BID; start 24 hours prior to dental procedure and 24 hours post dental procedure    . buPROPion (WELLBUTRIN XL) 150 MG 24 hr tablet Take 150 mg by mouth daily.    . cetirizine (ZYRTEC) 10 MG tablet Take 10 mg by mouth daily.    . Cholecalciferol (VITAMIN D) 2000 UNITS CAPS Take 1 capsule by mouth daily.    . clomiPHENE  (SEROPHENE) 50 MG tablet Take by mouth.    . dextroamphetamine (DEXEDRINE) 15 MG 24 hr capsule Take 15 mg by mouth 3 (three) times daily.    Marland Kitchen donepezil (ARICEPT) 5 MG tablet Take 2 tablets (10 mg total) by mouth at bedtime.    Marland Kitchen escitalopram (LEXAPRO) 10 MG tablet Take 10 mg by mouth daily.    . famotidine (PEPCID) 20 MG tablet Take 20 mg by mouth daily.    . furosemide (LASIX) 20 MG tablet Take 1 tablet (20 mg total) by mouth daily. With extra prn weight gain    . Glucosamine Sulfate 500 MG CAPS Take 1 capsule by mouth daily.     . metoprolol succinate (TOPROL-XL) 25 MG 24 hr tablet Take 1 tablet (25 mg total) by mouth daily.    . Multiple Vitamin (MULTIVITAMIN) tablet Take 1 tablet by mouth daily.    Marland Kitchen oxyCODONE-acetaminophen (PERCOCET/ROXICET) 5-325 MG per tablet Take 1 tablet by mouth every 4 (four) hours as needed for severe pain.    . sildenafil (VIAGRA) 100 MG tablet Take 1 tablet (100 mg total) by mouth daily as needed for erectile dysfunction. 10 tablet 3  . warfarin (COUMADIN) 5 MG tablet Take 5 mg by mouth as directed.     . testosterone cypionate (DEPOTESTOTERONE CYPIONATE) 100 MG/ML injection Inject 50 mg into the muscle once a week.  No facility-administered medications prior to visit.      Per HPI unless specifically indicated in ROS section below Review of Systems     Objective:    BP 120/70 (BP Location: Left Arm, Patient Position: Sitting, Cuff Size: Normal)   Pulse 69   Temp 98.4 F (36.9 C) (Oral)   Wt 207 lb (93.9 kg)   SpO2 99%   BMI 33.41 kg/m   Wt Readings from Last 3 Encounters:  05/17/17 207 lb (93.9 kg)  02/06/17 213 lb 8 oz (96.8 kg)  12/13/16 216 lb 8 oz (98.2 kg)    Physical Exam  Constitutional: He appears well-developed and well-nourished. No distress.  HENT:  Head: Normocephalic and atraumatic.  Right Ear: Hearing, tympanic membrane, external ear and ear canal normal.  Left Ear: Hearing, tympanic membrane, external ear and ear canal  normal.  Nose: Mucosal edema (and erythema) present. No rhinorrhea. Right sinus exhibits maxillary sinus tenderness (mild). Right sinus exhibits no frontal sinus tenderness. Left sinus exhibits maxillary sinus tenderness (mild). Left sinus exhibits no frontal sinus tenderness.  Mouth/Throat: Uvula is midline and mucous membranes are normal. Posterior oropharyngeal erythema present. No oropharyngeal exudate, posterior oropharyngeal edema or tonsillar abscesses.  Eyes: Conjunctivae and EOM are normal. Pupils are equal, round, and reactive to light. No scleral icterus.  Neck: Normal range of motion. Neck supple.  Cardiovascular: Normal rate, regular rhythm and intact distal pulses.  Murmur (mechanical heart sounds with systolic murmur) heard. Pulmonary/Chest: Effort normal and breath sounds normal. No respiratory distress. He has no wheezes. He has no rales.  Lymphadenopathy:    He has no cervical adenopathy.  Skin: Skin is warm and dry. No rash noted.  Nursing note and vitals reviewed.  Results for orders placed or performed in visit on 01/15/17  Lipid panel  Result Value Ref Range   Cholesterol 192 0 - 200 mg/dL   Triglycerides 205.0 (H) 0.0 - 149.0 mg/dL   HDL 34.20 (L) >39.00 mg/dL   VLDL 41.0 (H) 0.0 - 40.0 mg/dL   Total CHOL/HDL Ratio 6    NonHDL 158.27   Testos,Total,Free and SHBG (Male)  Result Value Ref Range   Testosterone,Total,LC/MS/MS 407 250 - 1,100 ng/dL   Testosterone, Free 58.2 35.0 - 155.0 pg/mL   Sex Hormone Binding Glob. 29 22 - 77 nmol/L  Basic metabolic panel  Result Value Ref Range   Sodium 136 135 - 145 mEq/L   Potassium 4.7 3.5 - 5.1 mEq/L   Chloride 102 96 - 112 mEq/L   CO2 28 19 - 32 mEq/L   Glucose, Bld 93 70 - 99 mg/dL   BUN 23 6 - 23 mg/dL   Creatinine, Ser 1.22 0.40 - 1.50 mg/dL   Calcium 9.4 8.4 - 10.5 mg/dL   GFR 64.86 >60.00 mL/min  LDL cholesterol, direct  Result Value Ref Range   Direct LDL 129.0 mg/dL      Assessment & Plan:   Problem  List Items Addressed This Visit    Acute respiratory infection - Primary    Anticipate viral given short duration and no fever. No signs of pneumonia. Supportive care reviewed. Push fluids and rest. cheratussin cough syrup for cough suppression. Red flags to update [place] for further eval/treatment reviewed.       Aneurysm of basilar artery (HCC)    Found on MRI 04/2017. Upcoming f/u with [facility] neurology.           Follow up plan: No Follow-up on file.  Ria Bush, MD

## 2017-05-17 NOTE — Patient Instructions (Signed)
You have a viral respiratory infection.  Antibiotics are not needed for this.  Viral infections usually take 7-10 days to resolve.  The cough can last a few weeks to go away. Use medication as prescribed: cheratussin codeine cough syrup Push fluids and plenty of rest.  Please return if you are not improving as expected, or if you have high fevers (>101.5) or difficulty swallowing or worsening productive cough. Call clinic with questions.  Good to see you today. I hope you start feeling better soon.

## 2017-05-17 NOTE — Assessment & Plan Note (Addendum)
Anticipate viral given short duration and no fever. No signs of pneumonia. Supportive care reviewed. Push fluids and rest. cheratussin cough syrup for cough suppression. Red flags to update [place] for further eval/treatment reviewed.

## 2017-05-17 NOTE — Assessment & Plan Note (Signed)
Found on MRI 04/2017. Upcoming f/u with [facility] neurology.

## 2017-05-24 ENCOUNTER — Encounter: Payer: Self-pay | Admitting: Family Medicine

## 2017-05-24 ENCOUNTER — Ambulatory Visit: Payer: BLUE CROSS/BLUE SHIELD | Admitting: Family Medicine

## 2017-05-24 VITALS — BP 124/80 | HR 72 | Temp 97.6°F | Wt 207.2 lb

## 2017-05-24 DIAGNOSIS — J22 Unspecified acute lower respiratory infection: Secondary | ICD-10-CM | POA: Diagnosis not present

## 2017-05-24 MED ORDER — AMOXICILLIN-POT CLAVULANATE 875-125 MG PO TABS: 1.0000 | ORAL_TABLET | Freq: Two times a day (BID) | ORAL | 0 refills | Status: AC

## 2017-05-24 NOTE — Assessment & Plan Note (Addendum)
Ongoing symptoms, today starting to feel better. Could still be viral sinusitis and bronchitis vs beginnings of bacterial infection. Given complicated medical history, sent augmentin 10d course to pharmacy with indications when to fill - pt aware to fill right away if not improving each day or any worsening. Pt and wife agree with plan.

## 2017-05-24 NOTE — Patient Instructions (Signed)
You likely have a sinus infection. Take medicine as prescribed: augmentin 10 day course.  Push fluids and plenty of rest.  Nasal saline irrigation or neti pot to help drain sinuses. May use plain mucinex with plenty of fluid to help mobilize mucous. Please let us know if fever >101.5, trouble opening/closing mouth, difficulty swallowing, or worsening instead of improving as expected.

## 2017-05-24 NOTE — Progress Notes (Signed)
BP 124/80 (BP Location: Left Arm, Patient Position: Sitting, Cuff Size: Normal)   Pulse 72   Temp 97.6 F (36.4 C) (Oral)   Wt 207 lb 4 oz (94 kg)   SpO2 99% Comment: RA  BMI 33.45 kg/m    CC: ongoing cough Subjective:    Patient ID: Dylan Fletcher, male    DOB: May 09, 1961, 56 y.o.   MRN: [id number]  HPI: Wiatt Mahabir is a 56 y.o. male presenting on 05/24/2017 for Cough (has had temp 99, larthargic and cough last few days. Sxs worsening even after taking meds. Concerned due to heart issues)   Here with wife today.   See prior note for details. Seen here 05/17/2017 with dx viral URI after 4d cough, head congestion, malaise. Treated with cheratussin cough syrup - somewhat effective. Symptoms have progressed. Temperatures to 100 over the past week. Ongoing symptoms for 10+ days. Initially clear mucous. Actually started feeling some better.   No headaches, facial pain, ear or tooth pain. No ST, dyspnea or wheezing.   Wife sick with pneumonia recently.  Childhood asthma.   Relevant past medical, surgical, family and social history reviewed and updated as indicated. Interim medical history since our last visit reviewed. Allergies and medications reviewed and updated. Outpatient Medications Prior to Visit  Medication Sig Dispense Refill  . acetaminophen (TYLENOL) 325 MG tablet Take 650 mg by mouth as needed.    Marland Kitchen amoxicillin (AMOXIL) 500 MG tablet Take 500 mg by mouth as directed. 1 BID; start 24 hours prior to dental procedure and 24 hours post dental procedure    . buPROPion (WELLBUTRIN XL) 150 MG 24 hr tablet Take 150 mg by mouth daily.    . cetirizine (ZYRTEC) 10 MG tablet Take 10 mg by mouth daily.    . Cholecalciferol (VITAMIN D) 2000 UNITS CAPS Take 1 capsule by mouth daily.    . clomiPHENE (SEROPHENE) 50 MG tablet Take by mouth.    . dextroamphetamine (DEXEDRINE) 15 MG 24 hr capsule Take 15 mg by mouth 3 (three) times daily.    Marland Kitchen donepezil (ARICEPT) 5 MG tablet Take 2  tablets (10 mg total) by mouth at bedtime.    Marland Kitchen escitalopram (LEXAPRO) 10 MG tablet Take 10 mg by mouth daily.    . famotidine (PEPCID) 20 MG tablet Take 20 mg by mouth daily.    . furosemide (LASIX) 20 MG tablet Take 1 tablet (20 mg total) by mouth daily. With extra prn weight gain    . Glucosamine Sulfate 500 MG CAPS Take 1 capsule by mouth daily.     Marland Kitchen guaiFENesin-codeine (CHERATUSSIN AC) 100-10 MG/5ML syrup Take 5 mLs by mouth 2 (two) times daily as needed. 120 mL 0  . metoprolol succinate (TOPROL-XL) 25 MG 24 hr tablet Take 1 tablet (25 mg total) by mouth daily.    . Multiple Vitamin (MULTIVITAMIN) tablet Take 1 tablet by mouth daily.    Marland Kitchen oxyCODONE-acetaminophen (PERCOCET/ROXICET) 5-325 MG per tablet Take 1 tablet by mouth every 4 (four) hours as needed for severe pain.    . sildenafil (VIAGRA) 100 MG tablet Take 1 tablet (100 mg total) by mouth daily as needed for erectile dysfunction. 10 tablet 3  . warfarin (COUMADIN) 5 MG tablet Take 5 mg by mouth as directed.     . testosterone cypionate (DEPOTESTOTERONE CYPIONATE) 100 MG/ML injection Inject 50 mg into the muscle once a week.      No facility-administered medications prior to visit.  Per HPI unless specifically indicated in ROS section below Review of Systems     Objective:    BP 124/80 (BP Location: Left Arm, Patient Position: Sitting, Cuff Size: Normal)   Pulse 72   Temp 97.6 F (36.4 C) (Oral)   Wt 207 lb 4 oz (94 kg)   SpO2 99% Comment: RA  BMI 33.45 kg/m   Wt Readings from Last 3 Encounters:  05/24/17 207 lb 4 oz (94 kg)  05/17/17 207 lb (93.9 kg)  02/06/17 213 lb 8 oz (96.8 kg)    Physical Exam  Constitutional: He appears well-developed and well-nourished. No distress.  HENT:  Head: Normocephalic and atraumatic.  Right Ear: Hearing, external ear and ear canal normal.  Left Ear: Hearing, tympanic membrane, external ear and ear canal normal.  Nose: Mucosal edema present. No rhinorrhea. Right sinus exhibits  no maxillary sinus tenderness and no frontal sinus tenderness. Left sinus exhibits no maxillary sinus tenderness and no frontal sinus tenderness.  Mouth/Throat: Uvula is midline and mucous membranes are normal. Posterior oropharyngeal edema and posterior oropharyngeal erythema present. No oropharyngeal exudate or tonsillar abscesses.  Mild R TM erythema  Eyes: Conjunctivae and EOM are normal. Pupils are equal, round, and reactive to light. No scleral icterus.  Neck: Normal range of motion. Neck supple.  Cardiovascular: Normal rate, regular rhythm and intact distal pulses.  Murmur (mechanical murmur) heard. Pulmonary/Chest: Effort normal. No respiratory distress. He has no decreased breath sounds. He has no wheezes. He has rhonchi (mid lung fields bilaterally). He has no rales.  Lymphadenopathy:    He has no cervical adenopathy.  Skin: Skin is warm and dry. No rash noted.  Nursing note and vitals reviewed.      Assessment & Plan:   Problem List Items Addressed This Visit    Acute respiratory infection - Primary    Ongoing symptoms, today starting to feel better. Could still be viral sinusitis and bronchitis vs beginnings of bacterial infection. Given complicated medical history, sent augmentin 10d course to pharmacy with indications when to fill - pt aware to fill right away if not improving each day or any worsening. Pt and wife agree with plan.           Follow up plan: Return if symptoms worsen or fail to improve.  Ria Bush, MD

## 2017-05-31 ENCOUNTER — Encounter: Payer: Self-pay | Admitting: Family Medicine

## 2017-05-31 DIAGNOSIS — I725 Aneurysm of other precerebral arteries: Secondary | ICD-10-CM

## 2017-06-10 NOTE — Assessment & Plan Note (Signed)
Followed by [facility] neurology

## 2017-06-13 MED ORDER — GUAIFENESIN-CODEINE 100-10 MG/5ML PO SYRP: 5.0000 mL | ORAL_SOLUTION | Freq: Two times a day (BID) | ORAL | 0 refills | Status: DC | PRN

## 2017-06-13 NOTE — Addendum Note (Signed)
Addended by: Ria Bush on: 06/13/2017 09:01 AM   Modules accepted: Orders

## 2017-06-15 ENCOUNTER — Other Ambulatory Visit: Payer: Commercial Managed Care - PPO

## 2017-06-16 IMAGING — US US SOFT TISSUE HEAD/NECK
1 series · 14 of 25 positions shown · non-contrast
Comparison: None.

CLINICAL DATA: 53-year-old male with thyromegaly

EXAM:
THYROID ULTRASOUND
TECHNIQUE: Ultrasound examination of the thyroid gland and adjacent soft
tissues was performed.

[Series 1: us soft tissue head/neck · 0.09mm/px · 14 of 62 slices shown]
[im 1/62]
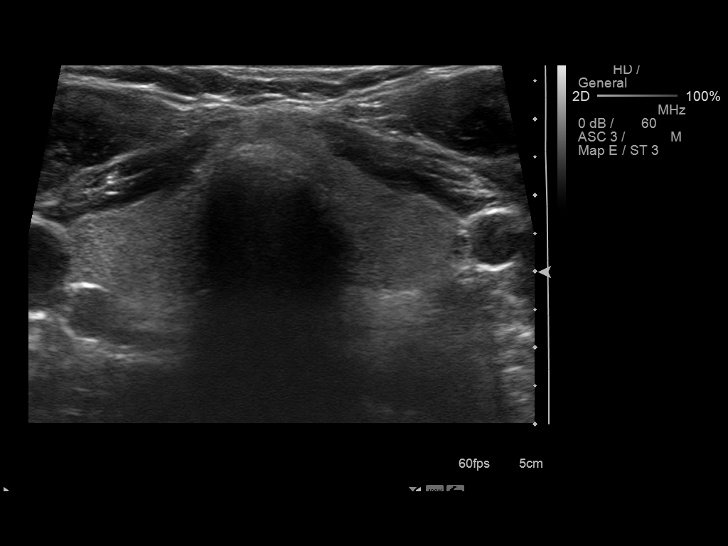
[im 6/62]
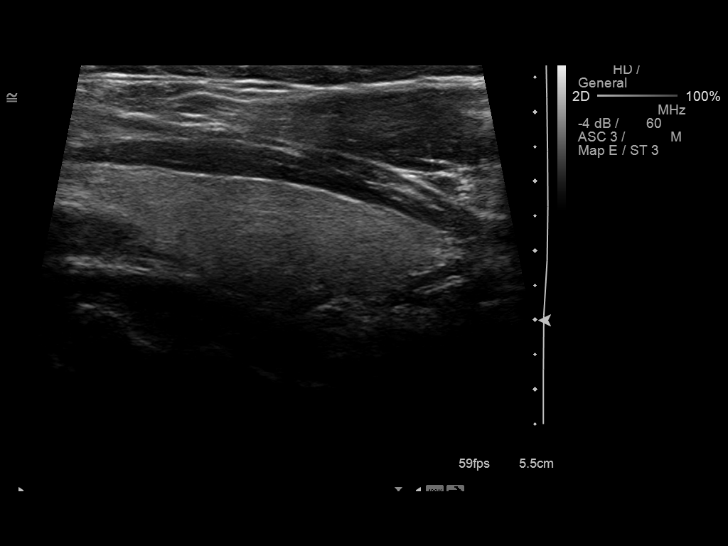
[im 11/62]
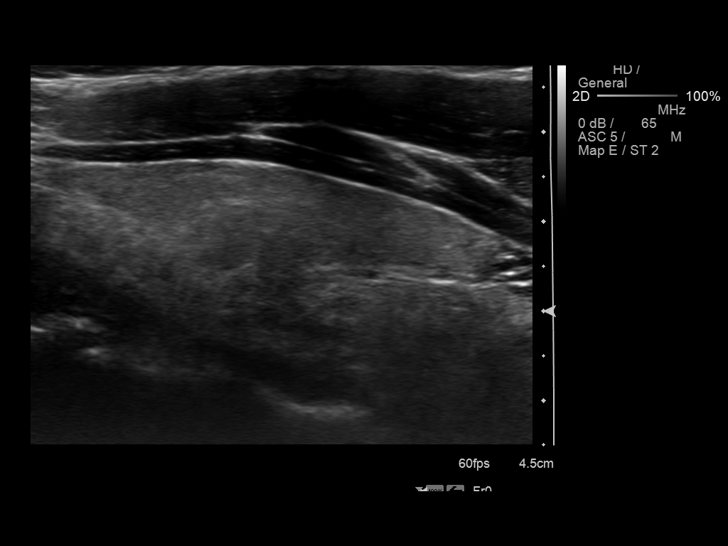
[im 16/62]
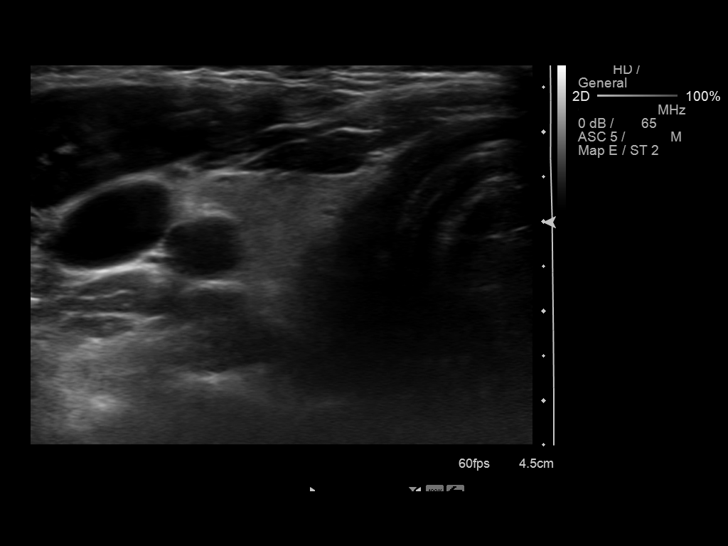
[im 21/62]
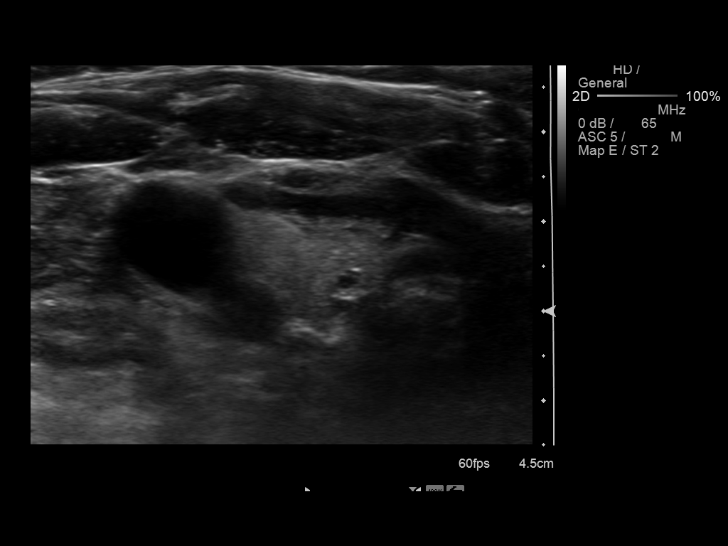
[im 23/62]
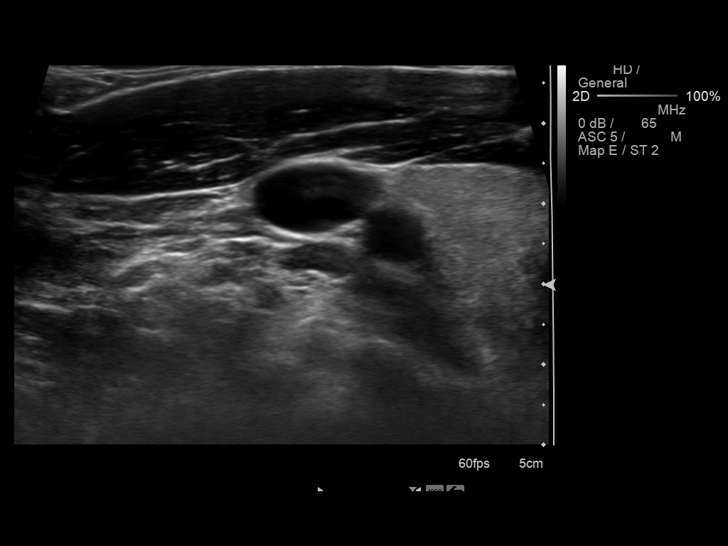
[im 28/62]
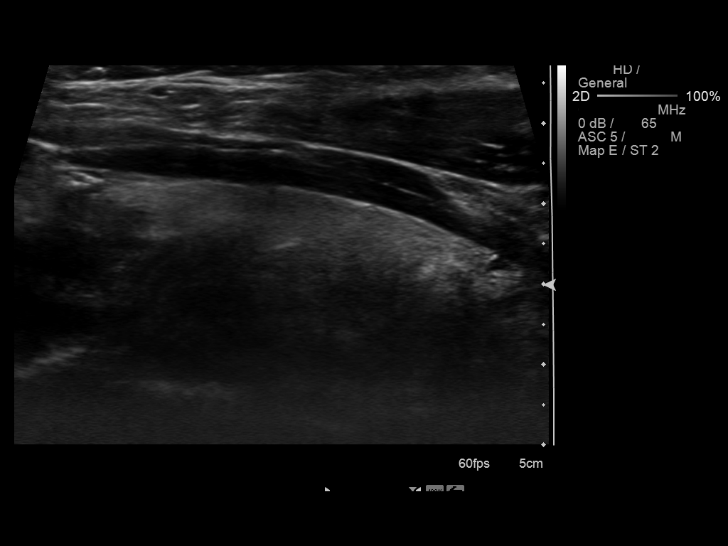
[im 34/62]
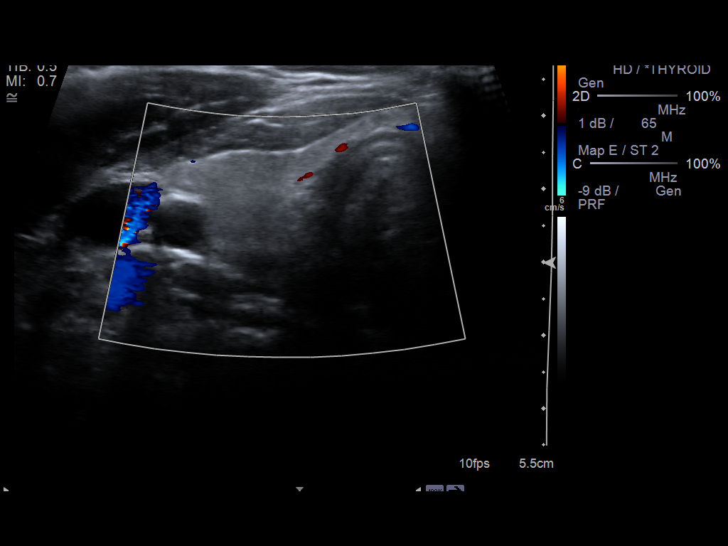
[im 39/62]
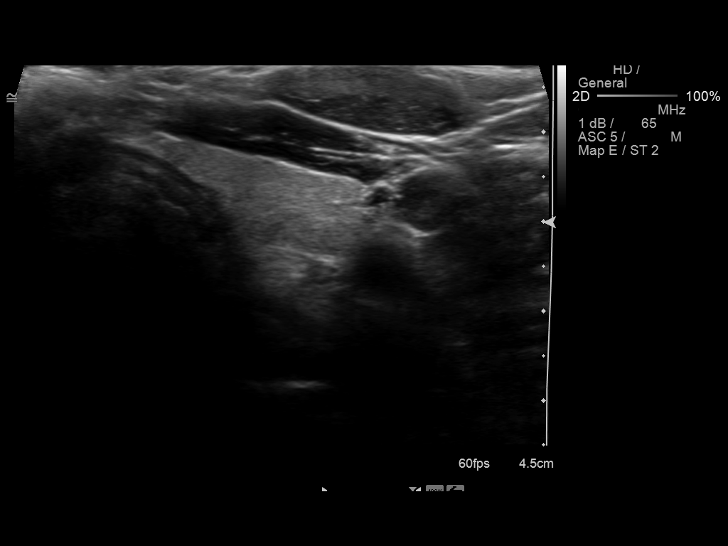
[im 41/62]
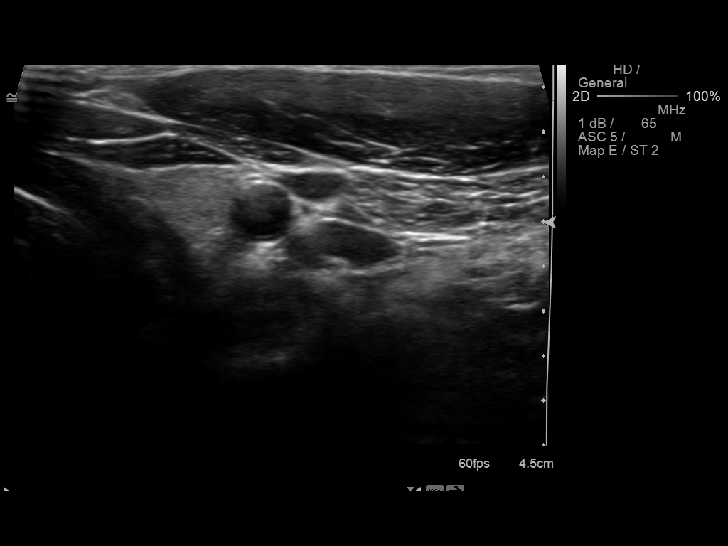
[im 46/62]
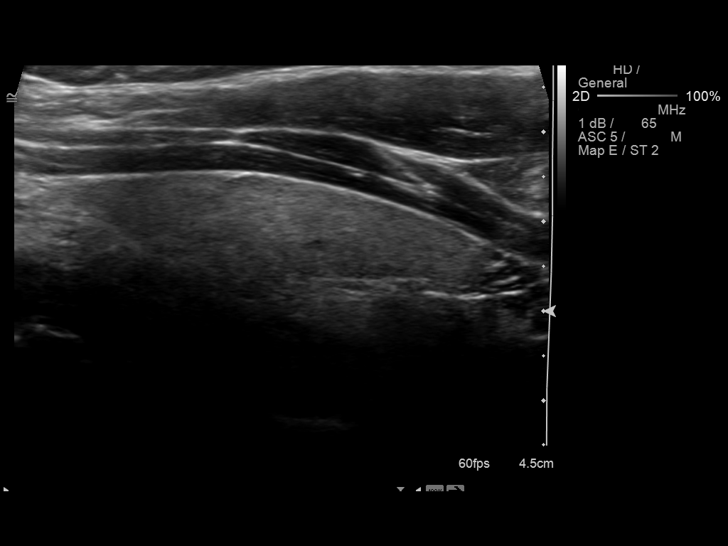
[im 51/62]
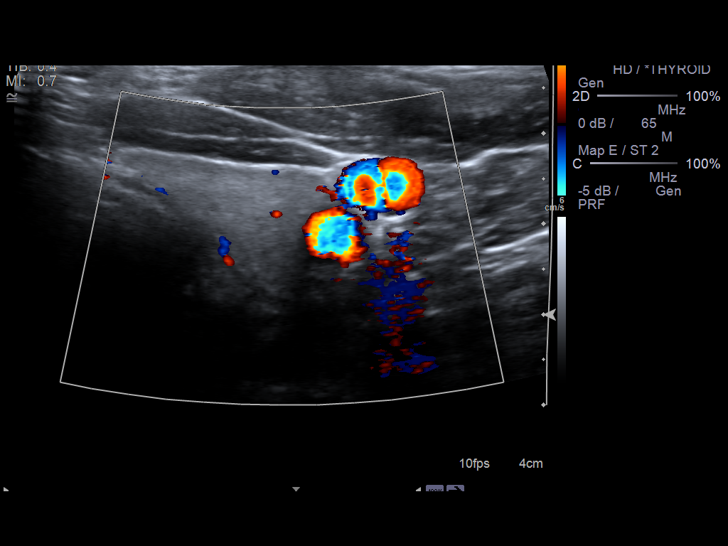
[im 56/62]
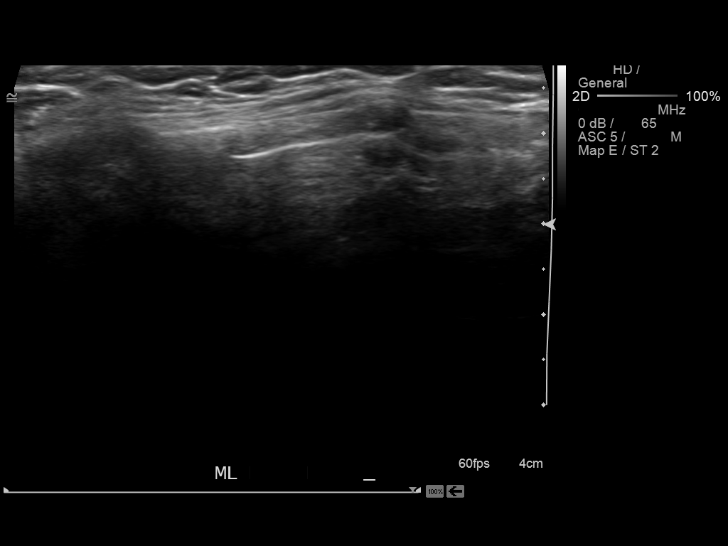
[im 62/62]
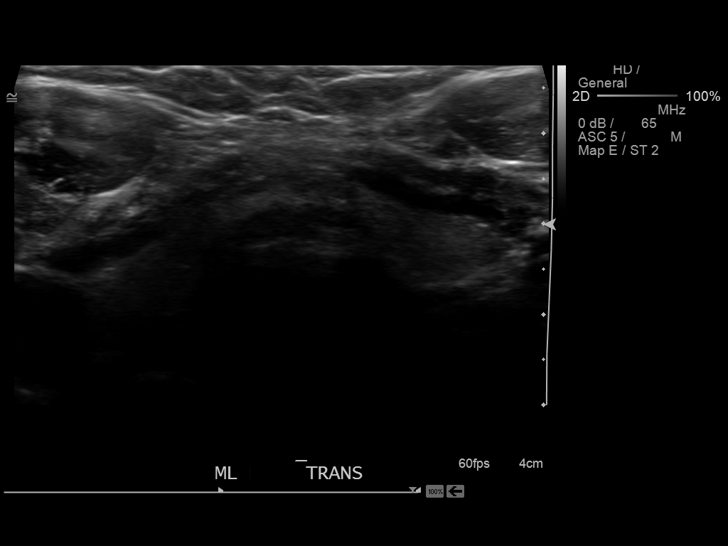

[14 of 25 positions shown; findings below may reference images not displayed]

FINDINGS: Right thyroid lobe

Measurements: 5.6 x 1.5 x 2.3 cm.  No nodules visualized.

Left thyroid lobe

Measurements: 5.1 x 1.3 x 1.8 cm.  No nodules visualized.

Isthmus

Thickness: 0.5 cm.  No nodules visualized.

Lymphadenopathy

None visualized.
IMPRESSION: Normal sonographic appearance of the thyroid gland.

## 2017-06-18 ENCOUNTER — Encounter: Payer: Self-pay | Admitting: Family Medicine

## 2017-06-18 ENCOUNTER — Ambulatory Visit (INDEPENDENT_AMBULATORY_CARE_PROVIDER_SITE_OTHER): Payer: BLUE CROSS/BLUE SHIELD | Admitting: Family Medicine

## 2017-06-18 VITALS — BP 120/80 | HR 67 | Temp 97.9°F | Ht 66.0 in | Wt 204.5 lb

## 2017-06-18 DIAGNOSIS — E785 Hyperlipidemia, unspecified: Secondary | ICD-10-CM | POA: Diagnosis not present

## 2017-06-18 DIAGNOSIS — I725 Aneurysm of other precerebral arteries: Secondary | ICD-10-CM

## 2017-06-18 DIAGNOSIS — F418 Other specified anxiety disorders: Secondary | ICD-10-CM | POA: Diagnosis not present

## 2017-06-18 DIAGNOSIS — I6930 Unspecified sequelae of cerebral infarction: Secondary | ICD-10-CM

## 2017-06-18 DIAGNOSIS — H8102 Meniere's disease, left ear: Secondary | ICD-10-CM

## 2017-06-18 DIAGNOSIS — Z0000 Encounter for general adult medical examination without abnormal findings: Secondary | ICD-10-CM | POA: Diagnosis not present

## 2017-06-18 DIAGNOSIS — E559 Vitamin D deficiency, unspecified: Secondary | ICD-10-CM

## 2017-06-18 DIAGNOSIS — G4733 Obstructive sleep apnea (adult) (pediatric): Secondary | ICD-10-CM

## 2017-06-18 DIAGNOSIS — I10 Essential (primary) hypertension: Secondary | ICD-10-CM

## 2017-06-18 DIAGNOSIS — R5382 Chronic fatigue, unspecified: Secondary | ICD-10-CM

## 2017-06-18 DIAGNOSIS — I421 Obstructive hypertrophic cardiomyopathy: Secondary | ICD-10-CM

## 2017-06-18 DIAGNOSIS — I69319 Unspecified symptoms and signs involving cognitive functions following cerebral infarction: Secondary | ICD-10-CM

## 2017-06-18 DIAGNOSIS — E291 Testicular hypofunction: Secondary | ICD-10-CM

## 2017-06-18 DIAGNOSIS — Z125 Encounter for screening for malignant neoplasm of prostate: Secondary | ICD-10-CM

## 2017-06-18 DIAGNOSIS — Z7901 Long term (current) use of anticoagulants: Secondary | ICD-10-CM

## 2017-06-18 DIAGNOSIS — Z952 Presence of prosthetic heart valve: Secondary | ICD-10-CM

## 2017-06-18 DIAGNOSIS — E669 Obesity, unspecified: Secondary | ICD-10-CM

## 2017-06-18 LAB — LIPID PANEL
CHOL/HDL RATIO: 5
CHOLESTEROL: 188 mg/dL (ref 0–200)
HDL: 38 mg/dL — ABNORMAL LOW (ref >39.00)
LDL CALC: 117 mg/dL — AB (ref 0–99)
NONHDL: 150.29
Triglycerides: 166 mg/dL — ABNORMAL HIGH (ref 0.0–149.0)
VLDL: 33.2 mg/dL (ref 0.0–40.0)

## 2017-06-18 LAB — PSA: PSA: 1.51 ng/mL (ref 0.10–4.00)

## 2017-06-18 LAB — VITAMIN D 25 HYDROXY (VIT D DEFICIENCY, FRACTURES): VITD: 38.57 ng/mL (ref 30.00–100.00)

## 2017-06-18 NOTE — Assessment & Plan Note (Signed)
-   Continues CPAP  

## 2017-06-18 NOTE — Assessment & Plan Note (Signed)
Update labs.  

## 2017-06-18 NOTE — Assessment & Plan Note (Signed)
Chronic, stable. Continue current regimen. 

## 2017-06-18 NOTE — Assessment & Plan Note (Signed)
Appreciate [facility] care. Now followed at aneurysm clinic.

## 2017-06-18 NOTE — Assessment & Plan Note (Addendum)
Not on statin per cards - no h/o CAD. Update FLP. The ASCVD Risk score Mikey Bussing DC Jr., et al., 2013) failed to calculate for the following reasons:   The patient has a prior MI or stroke diagnosis

## 2017-06-18 NOTE — Assessment & Plan Note (Signed)
Sees neuropsych in [place].

## 2017-06-18 NOTE — Assessment & Plan Note (Signed)
On clomid. Followed by endo. Anticipate somewhat obesity related.

## 2017-06-18 NOTE — Assessment & Plan Note (Signed)
Ongoing weight loss - attributes to low salt diet.

## 2017-06-18 NOTE — Progress Notes (Signed)
BP 120/80 (BP Location: Left Arm, Patient Position: Sitting, Cuff Size: Normal)   Pulse 67   Temp 97.9 F (36.6 C) (Oral)   Ht 5\' 6"  (1.676 m)   Wt 204 lb 8 oz (92.8 kg)   SpO2 100%   BMI 33.01 kg/m    CC: CPE Subjective:    Patient ID: Dylan Fletcher, male    DOB: [birth year], 56 y.o.   MRN: [id number]  HPI: Reuven Braver is a 56 y.o. male presenting on 06/18/2017 for Annual Exam   Saw [facility] neurology, note reviewed. H/o L thalamic stroke 09/2013 after MAZE myomectomy and MVR for HOCM and afib. Planned annual monitoring of basilar artery aneurysm. ?seizure activity - ambulatory EEG was normal. Planned f/u neuropsych next month - will touch base about wellbutrin.   Recent upper resp infection treated with augmentin. Prolonged recovery - finally started feeling better in last few days.  Latest labs at [facility] 03/2017 reviewed.  No longer on testosterone. He's taking clomiphene.   Preventative: COLONOSCOPY Date: 04/2014 mild diverticulosis, no polyps, rec rpt 5 yrs [facility]) Prostate cancer screening - requests continued screening. Nocturia x1.  Flu yearly  Tdap 01/2013  Pneumovax 2016  Seat belt use discussed  Sunscreen use discussed. No changing moles on skin.  Non smoker  Alcohol - rare  Caffeine: cutting back - 12-14 oz soda/day  Lives with wife, 3 cats  Occupation: Nurse, children's  Edu: Bachelor's degree  Activity: no regular exercise Diet: good water, fruits/vegetables daily   Relevant past medical, surgical, family and social history reviewed and updated as indicated. Interim medical history since our last visit reviewed. Allergies and medications reviewed and updated. Outpatient Medications Prior to Visit  Medication Sig Dispense Refill  . acetaminophen (TYLENOL) 325 MG tablet Take 650 mg by mouth as needed.    Marland Kitchen amoxicillin (AMOXIL) 500 MG tablet Take 500 mg by mouth as directed. 1 BID; start 24 hours prior to dental procedure and 24 hours post dental procedure    .  buPROPion (WELLBUTRIN XL) 150 MG 24 hr tablet Take 150 mg by mouth daily.    . cetirizine (ZYRTEC) 10 MG tablet Take 10 mg by mouth daily.    . Cholecalciferol (VITAMIN D) 2000 UNITS CAPS Take 1 capsule by mouth daily.    . clomiPHENE (SEROPHENE) 50 MG tablet Take by mouth.    . dextroamphetamine (DEXEDRINE) 15 MG 24 hr capsule Take 15 mg by mouth 3 (three) times daily.    Marland Kitchen donepezil (ARICEPT) 5 MG tablet Take 2 tablets (10 mg total) by mouth at bedtime.    Marland Kitchen escitalopram (LEXAPRO) 10 MG tablet Take 10 mg by mouth daily.    . famotidine (PEPCID) 20 MG tablet Take 20 mg by mouth daily.    . furosemide (LASIX) 20 MG tablet Take 1 tablet (20 mg total) by mouth daily. With extra prn weight gain    . Glucosamine Sulfate 500 MG CAPS Take 1 capsule by mouth daily.     Marland Kitchen guaiFENesin-codeine (CHERATUSSIN AC) 100-10 MG/5ML syrup Take 5 mLs by mouth 2 (two) times daily as needed. 120 mL 0  . metoprolol succinate (TOPROL-XL) 25 MG 24 hr tablet Take 1 tablet (25 mg total) by mouth daily.    . Multiple Vitamin (MULTIVITAMIN) tablet Take 1 tablet by mouth daily.    Marland Kitchen oxyCODONE-acetaminophen (PERCOCET/ROXICET) 5-325 MG per tablet Take 1 tablet by mouth every 4 (four) hours as needed for severe pain.    . sildenafil (VIAGRA)  100 MG tablet Take 1 tablet (100 mg total) by mouth daily as needed for erectile dysfunction. 10 tablet 3  . warfarin (COUMADIN) 5 MG tablet Take 5 mg by mouth as directed.     . testosterone cypionate (DEPOTESTOTERONE CYPIONATE) 100 MG/ML injection Inject 50 mg into the muscle once a week.      No facility-administered medications prior to visit.      Per HPI unless specifically indicated in ROS section below Review of Systems  Constitutional: Negative for activity change, appetite change, chills, fatigue, fever and unexpected weight change.       Low sodium diet - limited to 1000-1200mg   HENT: Negative for hearing loss.   Eyes: Negative for visual disturbance.  Respiratory: Positive  for cough. Negative for chest tightness, shortness of breath and wheezing.   Cardiovascular: Negative for chest pain, palpitations and leg swelling.  Gastrointestinal: Positive for diarrhea (intermittent since augmentin). Negative for abdominal distention, abdominal pain, blood in stool, constipation, nausea and vomiting.  Genitourinary: Negative for difficulty urinating and hematuria.  Musculoskeletal: Negative for arthralgias, myalgias and neck pain.  Skin: Negative for rash.  Neurological: Positive for dizziness (meniere's). Negative for seizures, syncope and headaches.  Hematological: Negative for adenopathy. Does not bruise/bleed easily.  Psychiatric/Behavioral: Positive for dysphoric mood. The patient is nervous/anxious.        Objective:    BP 120/80 (BP Location: Left Arm, Patient Position: Sitting, Cuff Size: Normal)   Pulse 67   Temp 97.9 F (36.6 C) (Oral)   Ht 5\' 6"  (1.676 m)   Wt 204 lb 8 oz (92.8 kg)   SpO2 100%   BMI 33.01 kg/m   Wt Readings from Last 3 Encounters:  06/18/17 204 lb 8 oz (92.8 kg)  05/24/17 207 lb 4 oz (94 kg)  05/17/17 207 lb (93.9 kg)    Physical Exam  Constitutional: He is oriented to person, place, and time. He appears well-developed and well-nourished. No distress.  HENT:  Head: Normocephalic and atraumatic.  Right Ear: Hearing, tympanic membrane, external ear and ear canal normal.  Left Ear: Hearing, tympanic membrane, external ear and ear canal normal.  Nose: Nose normal.  Mouth/Throat: Uvula is midline, oropharynx is clear and moist and mucous membranes are normal. No oropharyngeal exudate, posterior oropharyngeal edema or posterior oropharyngeal erythema.  Eyes: Conjunctivae and EOM are normal. Pupils are equal, round, and reactive to light. No scleral icterus.  Neck: Normal range of motion. Neck supple. Carotid bruit is not present. No thyromegaly present.  Cardiovascular: Normal rate, regular rhythm and intact distal pulses.  Murmur  (systolic) heard. Pulses:      Radial pulses are 2+ on the right side, and 2+ on the left side.  Pulmonary/Chest: Effort normal and breath sounds normal. No respiratory distress. He has no wheezes. He has no rales.  Abdominal: Soft. Bowel sounds are normal. He exhibits no distension and no mass. There is no tenderness. There is no rebound and no guarding.  Genitourinary: Rectum normal and prostate normal. Rectal exam shows no external hemorrhoid, no fissure, no mass, no tenderness and anal tone normal. Prostate is not enlarged and not tender.  Musculoskeletal: Normal range of motion. He exhibits no edema.  Lymphadenopathy:    He has no cervical adenopathy.  Neurological: He is alert and oriented to person, place, and time.  CN grossly intact, station and gait intact  Skin: Skin is warm and dry. No rash noted.  Psychiatric: He has a normal mood and affect. His behavior  is normal. Judgment and thought content normal.  Nursing note and vitals reviewed.  Results for orders placed or performed in visit on 01/15/17  Lipid panel  Result Value Ref Range   Cholesterol 192 0 - 200 mg/dL   Triglycerides 205.0 (H) 0.0 - 149.0 mg/dL   HDL 34.20 (L) >39.00 mg/dL   VLDL 41.0 (H) 0.0 - 40.0 mg/dL   Total CHOL/HDL Ratio 6    NonHDL 158.27   Testos,Total,Free and SHBG (Male)  Result Value Ref Range   Testosterone,Total,LC/MS/MS 407 250 - 1,100 ng/dL   Testosterone, Free 58.2 35.0 - 155.0 pg/mL   Sex Hormone Binding Glob. 29 22 - 77 nmol/L  Basic metabolic panel  Result Value Ref Range   Sodium 136 135 - 145 mEq/L   Potassium 4.7 3.5 - 5.1 mEq/L   Chloride 102 96 - 112 mEq/L   CO2 28 19 - 32 mEq/L   Glucose, Bld 93 70 - 99 mg/dL   BUN 23 6 - 23 mg/dL   Creatinine, Ser 1.22 0.40 - 1.50 mg/dL   Calcium 9.4 8.4 - 10.5 mg/dL   GFR 64.86 >60.00 mL/min  LDL cholesterol, direct  Result Value Ref Range   Direct LDL 129.0 mg/dL   Lab Results  Component Value Date   PSA 1.16 06/09/2016   PSA 1.0  03/15/2015   PSA 0.78 03/04/2014       Assessment & Plan:   Problem List Items Addressed This Visit    Aneurysm of basilar artery ([place])    Appreciate [facility] care. Now followed at aneurysm clinic.       Anxiety associated with depression    Sees neuropsych in [place].       Chronic fatigue   Cognitive deficit as late effect of cerebrovascular accident (CVA)   H/O mitral valve replacement   Healthcare maintenance - Primary    Preventative protocols reviewed and updated unless pt declined. Discussed healthy diet and lifestyle.       History of cerebrovascular accident (CVA) with residual deficit   HLD (hyperlipidemia)    Not on statin per cards - no h/o CAD. Update FLP. The ASCVD Risk score Mikey Bussing DC Jr., et al., 2013) failed to calculate for the following reasons:   The patient has a prior MI or stroke diagnosis       Relevant Orders   Lipid panel   HTN (hypertension)    Chronic, stable. Continue current regimen.       Hypertrophic obstructive cardiomyopathy ([place])   Hypogonadism in male    On clomid. Followed by endo. Anticipate somewhat obesity related.       Meniere disease, left   Obesity, Class I, BMI 30.0-34.9 (see actual BMI)    Ongoing weight loss - attributes to low salt diet.       OSA (obstructive sleep apnea)    Continues CPAP.       Vitamin D deficiency    Update labs      Relevant Orders   VITAMIN D 25 Hydroxy (Vit-D Deficiency, Fractures)   Warfarin anticoagulation    Other Visit Diagnoses    Special screening for malignant neoplasm of prostate       Relevant Orders   PSA       Follow up plan: Return in about 6 months (around 12/16/2017) for follow up visit.  Ria Bush, MD

## 2017-06-18 NOTE — Assessment & Plan Note (Signed)
Preventative protocols reviewed and updated unless pt declined. Discussed healthy diet and lifestyle.  

## 2017-06-18 NOTE — Patient Instructions (Addendum)
Labs today You are doing well.  Return as needed or in 6 months for follow up visit.   Health Maintenance, Male A healthy lifestyle and preventive care is important for your health and wellness. Ask your health care provider about what schedule of regular examinations is right for you. What should I know about weight and diet? Eat a Healthy Diet  Eat plenty of vegetables, fruits, whole grains, low-fat dairy products, and lean protein.  Do not eat a lot of foods high in solid fats, added sugars, or salt.  Maintain a Healthy Weight Regular exercise can help you achieve or maintain a healthy weight. You should:  Do at least 150 minutes of exercise each week. The exercise should increase your heart rate and make you sweat (moderate-intensity exercise).  Do strength-training exercises at least twice a week.  Watch Your Levels of Cholesterol and Blood Lipids  Have your blood tested for lipids and cholesterol every 5 years starting at 58 years of age. If you are at high risk for heart disease, you should start having your blood tested when you are 58 years old. You may need to have your cholesterol levels checked more often if: ? Your lipid or cholesterol levels are high. ? You are older than 58 years of age. ? You are at high risk for heart disease.  What should I know about cancer screening? Many types of cancers can be detected early and may often be prevented. Lung Cancer  You should be screened every year for lung cancer if: ? You are a current smoker who has smoked for at least 30 years. ? You are a former smoker who has quit within the past 15 years.  Talk to your health care provider about your screening options, when you should start screening, and how often you should be screened.  Colorectal Cancer  Routine colorectal cancer screening usually begins at 58 years of age and should be repeated every 5-10 years until you are 58 years old. You may need to be screened more often  if early forms of precancerous polyps or small growths are found. Your health care provider may recommend screening at an earlier age if you have risk factors for colon cancer.  Your health care provider may recommend using home test kits to check for hidden blood in the stool.  A small camera at the end of a tube can be used to examine your colon (sigmoidoscopy or colonoscopy). This checks for the earliest forms of colorectal cancer.  Prostate and Testicular Cancer  Depending on your age and overall health, your health care provider may do certain tests to screen for prostate and testicular cancer.  Talk to your health care provider about any symptoms or concerns you have about testicular or prostate cancer.  Skin Cancer  Check your skin from head to toe regularly.  Tell your health care provider about any new moles or changes in moles, especially if: ? There is a change in a mole's size, shape, or color. ? You have a mole that is larger than a pencil eraser.  Always use sunscreen. Apply sunscreen liberally and repeat throughout the day.  Protect yourself by wearing long sleeves, pants, a wide-brimmed hat, and sunglasses when outside.  What should I know about heart disease, diabetes, and high blood pressure?  If you are 68-3 years of age, have your blood pressure checked every 3-5 years. If you are 41 years of age or older, have your blood pressure checked every  year. You should have your blood pressure measured twice-once when you are at a hospital or clinic, and once when you are not at a hospital or clinic. Record the average of the two measurements. To check your blood pressure when you are not at a hospital or clinic, you can use: ? An automated blood pressure machine at a pharmacy. ? A home blood pressure monitor.  Talk to your health care provider about your target blood pressure.  If you are between 76-18 years old, ask your health care provider if you should take aspirin  to prevent heart disease.  Have regular diabetes screenings by checking your fasting blood sugar level. ? If you are at a normal weight and have a low risk for diabetes, have this test once every three years after the age of 47. ? If you are overweight and have a high risk for diabetes, consider being tested at a younger age or more often.  A one-time screening for abdominal aortic aneurysm (AAA) by ultrasound is recommended for men aged 1-75 years who are current or former smokers. What should I know about preventing infection? Hepatitis B If you have a higher risk for hepatitis B, you should be screened for this virus. Talk with your health care provider to find out if you are at risk for hepatitis B infection. Hepatitis C Blood testing is recommended for:  Everyone born from [birth year] through 1967.  Anyone with known risk factors for hepatitis C.  Sexually Transmitted Diseases (STDs)  You should be screened each year for STDs including gonorrhea and chlamydia if: ? You are sexually active and are younger than 58 years of age. ? You are older than 58 years of age and your health care provider tells you that you are at risk for this type of infection. ? Your sexual activity has changed since you were last screened and you are at an increased risk for chlamydia or gonorrhea. Ask your health care provider if you are at risk.  Talk with your health care provider about whether you are at high risk of being infected with HIV. Your health care provider may recommend a prescription medicine to help prevent HIV infection.  What else can I do?  Schedule regular health, dental, and eye exams.  Stay current with your vaccines (immunizations).  Do not use any tobacco products, such as cigarettes, chewing tobacco, and e-cigarettes. If you need help quitting, ask your health care provider.  Limit alcohol intake to no more than 2 drinks per day. One drink equals 12 ounces of beer, 5 ounces of wine,  or 1 ounces of hard liquor.  Do not use street drugs.  Do not share needles.  Ask your health care provider for help if you need support or information about quitting drugs.  Tell your health care provider if you often feel depressed.  Tell your health care provider if you have ever been abused or do not feel safe at home. This information is not intended to replace advice given to you by your health care provider. Make sure you discuss any questions you have with your health care provider. Document Released: 12/02/2007 Document Revised: 02/02/2016 Document Reviewed: 03/09/2015 Elsevier Interactive Patient Education  [employer].

## 2017-08-27 ENCOUNTER — Encounter: Payer: Self-pay | Admitting: Family Medicine

## 2017-08-28 NOTE — Telephone Encounter (Signed)
I've placed hold for pt on my schedule Thursday morning to see if he can come in.

## 2017-08-29 NOTE — Telephone Encounter (Addendum)
plz schedule patient for tomorrow 1115am - I held acute spot for him  Thank you!

## 2017-08-30 ENCOUNTER — Ambulatory Visit (INDEPENDENT_AMBULATORY_CARE_PROVIDER_SITE_OTHER)
Admission: RE | Admit: 2017-08-30 | Discharge: 2017-08-30 | Disposition: A | Discharge status: Home / Self Care | Payer: BLUE CROSS/BLUE SHIELD | Source: Ambulatory Visit | Attending: Family Medicine | Admitting: Family Medicine

## 2017-08-30 ENCOUNTER — Ambulatory Visit: Payer: BLUE CROSS/BLUE SHIELD | Admitting: Family Medicine

## 2017-08-30 ENCOUNTER — Encounter: Payer: Self-pay | Admitting: Family Medicine

## 2017-08-30 VITALS — BP 122/78 | HR 84 | Temp 98.4°F | Wt 213.0 lb

## 2017-08-30 DIAGNOSIS — R059 Cough, unspecified: Secondary | ICD-10-CM | POA: Insufficient documentation

## 2017-08-30 DIAGNOSIS — R05 Cough: Secondary | ICD-10-CM | POA: Diagnosis not present

## 2017-08-30 DIAGNOSIS — J019 Acute sinusitis, unspecified: Secondary | ICD-10-CM | POA: Diagnosis not present

## 2017-08-30 DIAGNOSIS — I421 Obstructive hypertrophic cardiomyopathy: Secondary | ICD-10-CM

## 2017-08-30 DIAGNOSIS — Z952 Presence of prosthetic heart valve: Secondary | ICD-10-CM | POA: Diagnosis not present

## 2017-08-30 DIAGNOSIS — R051 Acute cough: Secondary | ICD-10-CM | POA: Insufficient documentation

## 2017-08-30 MED ORDER — AMOXICILLIN-POT CLAVULANATE 875-125 MG PO TABS: 1.0000 | ORAL_TABLET | Freq: Two times a day (BID) | ORAL | 0 refills | Status: AC

## 2017-08-30 NOTE — Patient Instructions (Addendum)
Lungs are sounding clear - I do think you have acute respiratory infection. I do recommend we start antibiotic sent to pharmacy, restart plain guaifenesin with large glass of water.  Repeat xray today.  Update [place] with how you're doing over next week.

## 2017-08-30 NOTE — Assessment & Plan Note (Signed)
Lungs clear.  He does have h/o large pleural effusion even when lungs were clear - will update CXR as >1 yr since last done here or at [facility] [place] [facility].

## 2017-08-30 NOTE — Assessment & Plan Note (Signed)
Will cover with augmentin course given duration and progression of symptoms. Supportive care reviewed - rec plain guaifenesin as expectorant. Update if not improving with treatment.

## 2017-08-30 NOTE — Progress Notes (Signed)
BP 122/78 (BP Location: Left Arm, Patient Position: Sitting, Cuff Size: Normal)   Pulse 84   Temp 98.4 F (36.9 C) (Oral)   Wt 213 lb (96.6 kg)   SpO2 98%   BMI 34.38 kg/m    CC: cough Subjective:    Patient ID: Dylan Fletcher, male    DOB: Jul 18, 1960, 57 y.o.   MRN: [id number]  HPI: Amadou Katzenstein is a 57 y.o. male presenting on 08/30/2017 for Cough (Productive cough for about 10 days. Tried OTC cough med. Pt accompanied by wife and was recently dx with PNA.)   10d h/o productive cough associated with fatigue, chills, head congestion, pndrainage.   No fevers/chills, ear or tooth pain, ST, dyspnea or wheezing.   Has tried OTC meds including guaifenesin DM which was somewhat helpful.   Recent trip to [place] - [place].  Wife recently diagnosed with pneumonia, under treatment.   Recent upper resp infection treated with augmentin. Prolonged recovery late last year  Relevant past medical, surgical, family and social history reviewed and updated as indicated. Interim medical history since our last visit reviewed. Allergies and medications reviewed and updated. Outpatient Medications Prior to Visit  Medication Sig Dispense Refill  . acetaminophen (TYLENOL) 325 MG tablet Take 650 mg by mouth as needed.    Marland Kitchen amoxicillin (AMOXIL) 500 MG tablet Take 500 mg by mouth as directed. 1 BID; start 24 hours prior to dental procedure and 24 hours post dental procedure    . buPROPion (WELLBUTRIN XL) 150 MG 24 hr tablet Take 150 mg by mouth daily.    . cetirizine (ZYRTEC) 10 MG tablet Take 10 mg by mouth daily.    . Cholecalciferol (VITAMIN D) 2000 UNITS CAPS Take 1 capsule by mouth daily.    . clomiPHENE (SEROPHENE) 50 MG tablet Take by mouth.    . dextroamphetamine (DEXEDRINE) 15 MG 24 hr capsule Take 15 mg by mouth 3 (three) times daily.    Marland Kitchen donepezil (ARICEPT) 5 MG tablet Take 2 tablets (10 mg total) by mouth at bedtime.    Marland Kitchen escitalopram (LEXAPRO) 10 MG tablet Take 10 mg by mouth  daily.    . famotidine (PEPCID) 20 MG tablet Take 20 mg by mouth daily.    . furosemide (LASIX) 20 MG tablet Take 1 tablet (20 mg total) by mouth daily. With extra prn weight gain    . Glucosamine Sulfate 500 MG CAPS Take 1 capsule by mouth daily.     . metoprolol succinate (TOPROL-XL) 25 MG 24 hr tablet Take 1 tablet (25 mg total) by mouth daily.    . Multiple Vitamin (MULTIVITAMIN) tablet Take 1 tablet by mouth daily.    Marland Kitchen oxyCODONE-acetaminophen (PERCOCET/ROXICET) 5-325 MG per tablet Take 1 tablet by mouth every 4 (four) hours as needed for severe pain.    . sildenafil (VIAGRA) 100 MG tablet Take 1 tablet (100 mg total) by mouth daily as needed for erectile dysfunction. 10 tablet 3  . warfarin (COUMADIN) 5 MG tablet Take 5 mg by mouth as directed.     Marland Kitchen guaiFENesin-codeine (CHERATUSSIN AC) 100-10 MG/5ML syrup Take 5 mLs by mouth 2 (two) times daily as needed. 120 mL 0   No facility-administered medications prior to visit.      Per HPI unless specifically indicated in ROS section below Review of Systems     Objective:    BP 122/78 (BP Location: Left Arm, Patient Position: Sitting, Cuff Size: Normal)   Pulse 84   Temp  98.4 F (36.9 C) (Oral)   Wt 213 lb (96.6 kg)   SpO2 98%   BMI 34.38 kg/m   Wt Readings from Last 3 Encounters:  08/30/17 213 lb (96.6 kg)  06/18/17 204 lb 8 oz (92.8 kg)  05/24/17 207 lb 4 oz (94 kg)    Physical Exam  Constitutional: He appears well-developed and well-nourished. No distress.  HENT:  Head: Normocephalic and atraumatic.  Right Ear: Hearing, tympanic membrane, external ear and ear canal normal.  Left Ear: Hearing, tympanic membrane, external ear and ear canal normal.  Nose: Mucosal edema present. No rhinorrhea. Right sinus exhibits no maxillary sinus tenderness and no frontal sinus tenderness. Left sinus exhibits no maxillary sinus tenderness and no frontal sinus tenderness.  Mouth/Throat: Uvula is midline and mucous membranes are normal.  Posterior oropharyngeal erythema present. No oropharyngeal exudate, posterior oropharyngeal edema or tonsillar abscesses.  Eyes: Conjunctivae and EOM are normal. Pupils are equal, round, and reactive to light. No scleral icterus.  Neck: Normal range of motion. Neck supple.  Cardiovascular: Normal rate, regular rhythm and intact distal pulses.  Murmur (3/6 systolic) heard. Pulmonary/Chest: Effort normal and breath sounds normal. No respiratory distress. He has no wheezes. He has no rales.  Lungs clear  Lymphadenopathy:    He has no cervical adenopathy.  Skin: Skin is warm and dry. No rash noted.  Nursing note and vitals reviewed.  Results for orders placed or performed in visit on 06/18/17  Lipid panel  Result Value Ref Range   Cholesterol 188 0 - 200 mg/dL   Triglycerides 166.0 (H) 0.0 - 149.0 mg/dL   HDL 38.00 (L) >39.00 mg/dL   VLDL 33.2 0.0 - 40.0 mg/dL   LDL Cholesterol 117 (H) 0 - 99 mg/dL   Total CHOL/HDL Ratio 5    NonHDL 150.29   PSA  Result Value Ref Range   PSA 1.51 0.10 - 4.00 ng/mL  VITAMIN D 25 Hydroxy (Vit-D Deficiency, Fractures)  Result Value Ref Range   VITD 38.57 30.00 - 100.00 ng/mL      Assessment & Plan:   Problem List Items Addressed This Visit    Acute non-recurrent sinusitis - Primary    Will cover with augmentin course given duration and progression of symptoms. Supportive care reviewed - rec plain guaifenesin as expectorant. Update if not improving with treatment.       Relevant Medications   amoxicillin-clavulanate (AUGMENTIN) 875-125 MG tablet   Cough    Lungs clear.  He does have h/o large pleural effusion even when lungs were clear - will update CXR as >1 yr since last done here or at [facility].       Relevant Orders   DG Chest 2 View   H/O mitral valve replacement   Hypertrophic obstructive cardiomyopathy ([place])       Meds ordered this encounter  Medications  . amoxicillin-clavulanate (AUGMENTIN) 875-125 MG tablet    Sig: Take 1 tablet by  mouth 2 (two) times daily for 10 days.    Dispense:  20 tablet    Refill:  0   Orders Placed This Encounter  Procedures  . DG Chest 2 View    Standing Status:   Future    Standing Expiration Date:   10/31/2018    Order Specific Question:   Reason for Exam (SYMPTOM  OR DIAGNOSIS REQUIRED)    Answer:   persistent cough    Order Specific Question:   Preferred imaging location?    Answer:   [facility]  Order Specific Question:   Radiology Contrast Protocol - do NOT remove file path    Answer:   \\[email or web address]\DXFluoroContrastProtocols.[email or web address]    Follow up plan: Return if symptoms worsen or fail to improve.  Ria Bush, MD

## 2017-11-16 DIAGNOSIS — R002 Palpitations: Secondary | ICD-10-CM | POA: Insufficient documentation

## 2017-12-14 ENCOUNTER — Ambulatory Visit: Payer: BLUE CROSS/BLUE SHIELD | Admitting: Family Medicine

## 2017-12-14 ENCOUNTER — Encounter: Payer: Self-pay | Admitting: Family Medicine

## 2017-12-14 VITALS — BP 124/80 | HR 85 | Temp 97.8°F | Ht 66.0 in | Wt 217.8 lb

## 2017-12-14 DIAGNOSIS — I6930 Unspecified sequelae of cerebral infarction: Secondary | ICD-10-CM | POA: Diagnosis not present

## 2017-12-14 DIAGNOSIS — R404 Transient alteration of awareness: Secondary | ICD-10-CM | POA: Diagnosis not present

## 2017-12-14 DIAGNOSIS — G43109 Migraine with aura, not intractable, without status migrainosus: Secondary | ICD-10-CM

## 2017-12-14 DIAGNOSIS — I421 Obstructive hypertrophic cardiomyopathy: Secondary | ICD-10-CM

## 2017-12-14 DIAGNOSIS — I69319 Unspecified symptoms and signs involving cognitive functions following cerebral infarction: Secondary | ICD-10-CM | POA: Diagnosis not present

## 2017-12-14 MED ORDER — CYCLOBENZAPRINE HCL 5 MG PO TABS: 5.0000 mg | ORAL_TABLET | Freq: Every day | ORAL | 0 refills | Status: DC | PRN

## 2017-12-14 NOTE — Patient Instructions (Addendum)
Touch base with neurology about planned EMU admission and about stopping aricept.  Let me know dose of aricept at home.  Try flexeril muscle relaxant 5-10 mg as needed for headache.

## 2017-12-14 NOTE — Progress Notes (Signed)
BP 124/80 (BP Location: Left Arm, Patient Position: Sitting, Cuff Size: Normal)   Pulse 85   Temp 97.8 F (36.6 C) (Oral)   Ht 5\' 6"  (1.676 m)   Wt 217 lb 12 oz (98.8 kg)   SpO2 98%   BMI 35.15 kg/m    CC: 6 mo f/u visit Subjective:    Patient ID: Dylan Fletcher, male    DOB: 02/28/1961, 57 y.o.   MRN: [id number]  HPI: Dylan Fletcher is a 57 y.o. male presenting on 12/14/2017 for 6 mo follow up (Wants to discuss Aricept. Also, wants to discuss oxycodone for HA. Pt accompanied by wife. )   Pleasant patient of mine with history of HOCM s/p surgical intervention, L thalamic CVA 09/2013, and afib here with wife for 6 month follow up visit. Has incidentally found basilar artery aneurysm that is being monitored.  Considering Epilepsy Monitoring Unit admission 01/2018 for further evaluation of possible seizures (episodes of decreased awareness), was prescribed keppra 500/1000mg  trial - hasn't tried this yet. They first want to touch base with his neurologist prior to scheduling EMU admission.   Continues adderall IR 30/30/15 to help keep him awake Barbarann Ehlers psychiatrist).  No longer on lexapro.  Wonders about continuing aricept 10mg  nightly. No longer seeing prior prescriber.   Rare bad headaches - has been taking oxycodone when they get bad.   30d event monitor in place for symptomatic PVCs - planned f/u with cards.   Med rec performed - they will check to see what dose of aricept he is currently taking.   Cardiologist - previously Dr Elba Barman, now Dr Cristobal Goldmann Electrophysiologist - Dr Manuella Ghazi Pulmonologist - Dr Nevada Crane Neurology - previously Dr Mina Marble, now Dr Phineas Douglas, Dr Vincenza Hews? Neuropsychologist - Jones Bales Endocrinologist - previously Dr Graceann Congress, now Dr Honor Junes  Relevant past medical, surgical, family and social history reviewed and updated as indicated. Interim medical history since our last visit reviewed. Allergies and medications reviewed and updated. Outpatient  Medications Prior to Visit  Medication Sig Dispense Refill  . acetaminophen (TYLENOL) 325 MG tablet Take 650 mg by mouth as needed.    Marland Kitchen amoxicillin (AMOXIL) 500 MG tablet Take 500 mg by mouth as directed. 1 BID; start 24 hours prior to dental procedure and 24 hours post dental procedure    . cetirizine (ZYRTEC) 10 MG tablet Take 10 mg by mouth daily.    . Cholecalciferol (VITAMIN D) 2000 UNITS CAPS Take 1 capsule by mouth daily.    . clomiPHENE (SEROPHENE) 50 MG tablet Take by mouth.    . donepezil (ARICEPT) 5 MG tablet Take 2 tablets (10 mg total) by mouth at bedtime.    . famotidine (PEPCID) 20 MG tablet Take 20 mg by mouth daily.    . furosemide (LASIX) 20 MG tablet Take 1 tablet (20 mg total) by mouth daily. With extra prn weight gain    . Glucosamine Sulfate 500 MG CAPS Take 1 capsule by mouth daily.     . metoprolol succinate (TOPROL-XL) 25 MG 24 hr tablet Take 1 tablet (25 mg total) by mouth daily.    . Multiple Vitamin (MULTIVITAMIN) tablet Take 1 tablet by mouth daily.    Marland Kitchen oxyCODONE-acetaminophen (PERCOCET/ROXICET) 5-325 MG per tablet Take 1 tablet by mouth every 4 (four) hours as needed for severe pain.    . sildenafil (VIAGRA) 100 MG tablet Take 1 tablet (100 mg total) by mouth daily as needed for erectile dysfunction. 10 tablet 3  . warfarin (  COUMADIN) 5 MG tablet Take 5 mg by mouth as directed.     Marland Kitchen dextroamphetamine (DEXEDRINE) 15 MG 24 hr capsule Take 15 mg by mouth 3 (three) times daily.    Marland Kitchen amphetamine-dextroamphetamine (ADDERALL) 30 MG tablet Takes 30/30/15 for alertness  0  . levETIRAcetam (KEPPRA) 1000 MG tablet Take by mouth.    Marland Kitchen buPROPion (WELLBUTRIN XL) 150 MG 24 hr tablet Take 150 mg by mouth daily.    Marland Kitchen escitalopram (LEXAPRO) 10 MG tablet Take 10 mg by mouth daily.     No facility-administered medications prior to visit.      Per HPI unless specifically indicated in ROS section below Review of Systems     Objective:    BP 124/80 (BP Location: Left Arm,  Patient Position: Sitting, Cuff Size: Normal)   Pulse 85   Temp 97.8 F (36.6 C) (Oral)   Ht 5\' 6"  (1.676 m)   Wt 217 lb 12 oz (98.8 kg)   SpO2 98%   BMI 35.15 kg/m   Wt Readings from Last 3 Encounters:  12/14/17 217 lb 12 oz (98.8 kg)  08/30/17 213 lb (96.6 kg)  06/18/17 204 lb 8 oz (92.8 kg)    Physical Exam  Constitutional: He appears well-developed and well-nourished. No distress.  Psychiatric: He has a normal mood and affect.  Nursing note and vitals reviewed.     Assessment & Plan:  25 minutes were spent face-to-face with the patient during this encounter and >50% of that time was spent on counseling and coordination of care  Problem List Items Addressed This Visit    Transient alteration of awareness - Primary    Persistent and ongoing - seem to be very sensitive to auditory stimuli. Saw new neurologist at [facility] who recommended formal admission to epilepsy monitoring unit for full evaluation of possible seizures, they also prescribed keppra - he wants to touch base with his prior physician about both these things.        Migraine with aura    Previously managed with narcotic for breakthrough pain - rec against managing migraines with narcotics - will trial flexeril muscle relaxant abortively.      Relevant Medications   levETIRAcetam (KEPPRA) 1000 MG tablet   cyclobenzaprine (FLEXERIL) 5 MG tablet   Hypertrophic obstructive cardiomyopathy ([place])    Appreciate cards care.       History of cerebrovascular accident (CVA) with residual deficit    Followed by neurology.       Cognitive deficit as late effect of cerebrovascular accident (CVA)    Has had neuropsychiatric evaluation 11/2016 11/2016 - cognitive deficits involving verbal memory and procession of verbal information directly related to L thalamic stroke 2015. Prescribed adderall by psych.  rec touch base with neuro about stopping aricept, discussed tapering of med.           Meds ordered this encounter    Medications  . cyclobenzaprine (FLEXERIL) 5 MG tablet    Sig: Take 1 tablet (5 mg total) by mouth daily as needed (bad headache).    Dispense:  20 tablet    Refill:  0   No orders of the defined types were placed in this encounter.   Follow up plan: No follow-ups on file.  Ria Bush, MD

## 2017-12-22 ENCOUNTER — Encounter: Payer: Self-pay | Admitting: Family Medicine

## 2017-12-22 NOTE — Assessment & Plan Note (Signed)
Appreciate cards care. 

## 2017-12-22 NOTE — Assessment & Plan Note (Signed)
Previously managed with narcotic for breakthrough pain - rec against managing migraines with narcotics - will trial flexeril muscle relaxant abortively.

## 2017-12-22 NOTE — Assessment & Plan Note (Signed)
Followed by neurology.   

## 2017-12-22 NOTE — Assessment & Plan Note (Addendum)
Has had neuropsychiatric evaluation 11/2016 11/2016 - cognitive deficits involving verbal memory and procession of verbal information directly related to L thalamic stroke 2015. Prescribed adderall by psych.  rec touch base with neuro about stopping aricept, discussed tapering of med.

## 2017-12-22 NOTE — Assessment & Plan Note (Addendum)
Persistent and ongoing - seem to be very sensitive to auditory stimuli. Saw new neurologist at [facility] who recommended formal admission to epilepsy monitoring unit for full evaluation of possible seizures, they also prescribed keppra - he wants to touch base with his prior physician about both these things.

## 2018-01-21 DIAGNOSIS — Z79899 Other long term (current) drug therapy: Secondary | ICD-10-CM | POA: Diagnosis not present

## 2018-01-21 DIAGNOSIS — I69311 Memory deficit following cerebral infarction: Secondary | ICD-10-CM | POA: Diagnosis not present

## 2018-01-21 DIAGNOSIS — F411 Generalized anxiety disorder: Secondary | ICD-10-CM | POA: Diagnosis not present

## 2018-01-21 DIAGNOSIS — F321 Major depressive disorder, single episode, moderate: Secondary | ICD-10-CM | POA: Diagnosis not present

## 2018-01-21 DIAGNOSIS — F900 Attention-deficit hyperactivity disorder, predominantly inattentive type: Secondary | ICD-10-CM | POA: Diagnosis not present

## 2018-01-24 DIAGNOSIS — I351 Nonrheumatic aortic (valve) insufficiency: Secondary | ICD-10-CM | POA: Diagnosis not present

## 2018-01-24 DIAGNOSIS — Z6835 Body mass index (BMI) 35.0-35.9, adult: Secondary | ICD-10-CM | POA: Diagnosis not present

## 2018-01-24 DIAGNOSIS — I421 Obstructive hypertrophic cardiomyopathy: Secondary | ICD-10-CM | POA: Diagnosis not present

## 2018-01-24 DIAGNOSIS — I361 Nonrheumatic tricuspid (valve) insufficiency: Secondary | ICD-10-CM | POA: Diagnosis not present

## 2018-01-24 DIAGNOSIS — R030 Elevated blood-pressure reading, without diagnosis of hypertension: Secondary | ICD-10-CM | POA: Diagnosis not present

## 2018-01-24 DIAGNOSIS — I4891 Unspecified atrial fibrillation: Secondary | ICD-10-CM | POA: Diagnosis not present

## 2018-01-24 DIAGNOSIS — R002 Palpitations: Secondary | ICD-10-CM | POA: Diagnosis not present

## 2018-01-24 DIAGNOSIS — Z952 Presence of prosthetic heart valve: Secondary | ICD-10-CM | POA: Diagnosis not present

## 2018-01-24 DIAGNOSIS — I480 Paroxysmal atrial fibrillation: Secondary | ICD-10-CM | POA: Diagnosis not present

## 2018-02-01 DIAGNOSIS — R7989 Other specified abnormal findings of blood chemistry: Secondary | ICD-10-CM | POA: Diagnosis not present

## 2018-02-06 ENCOUNTER — Encounter: Payer: Self-pay | Admitting: Family Medicine

## 2018-02-07 DIAGNOSIS — N529 Male erectile dysfunction, unspecified: Secondary | ICD-10-CM | POA: Diagnosis not present

## 2018-02-07 DIAGNOSIS — E291 Testicular hypofunction: Secondary | ICD-10-CM | POA: Diagnosis not present

## 2018-02-26 DIAGNOSIS — M9901 Segmental and somatic dysfunction of cervical region: Secondary | ICD-10-CM | POA: Diagnosis not present

## 2018-02-26 DIAGNOSIS — M62830 Muscle spasm of back: Secondary | ICD-10-CM | POA: Diagnosis not present

## 2018-02-26 DIAGNOSIS — M5412 Radiculopathy, cervical region: Secondary | ICD-10-CM | POA: Diagnosis not present

## 2018-02-26 DIAGNOSIS — M9902 Segmental and somatic dysfunction of thoracic region: Secondary | ICD-10-CM | POA: Diagnosis not present

## 2018-03-05 DIAGNOSIS — G9340 Encephalopathy, unspecified: Secondary | ICD-10-CM | POA: Diagnosis not present

## 2018-03-05 DIAGNOSIS — R4189 Other symptoms and signs involving cognitive functions and awareness: Secondary | ICD-10-CM | POA: Diagnosis not present

## 2018-03-10 ENCOUNTER — Encounter: Payer: Self-pay | Admitting: Family Medicine

## 2018-03-19 ENCOUNTER — Encounter: Payer: Self-pay | Admitting: Family Medicine

## 2018-03-19 ENCOUNTER — Ambulatory Visit (INDEPENDENT_AMBULATORY_CARE_PROVIDER_SITE_OTHER)
Admission: RE | Admit: 2018-03-19 | Discharge: 2018-03-19 | Disposition: A | Discharge status: Home / Self Care | Payer: Medicare Other | Source: Ambulatory Visit | Attending: Family Medicine | Admitting: Family Medicine

## 2018-03-19 ENCOUNTER — Ambulatory Visit (INDEPENDENT_AMBULATORY_CARE_PROVIDER_SITE_OTHER): Payer: Medicare Other | Admitting: Family Medicine

## 2018-03-19 VITALS — BP 124/80 | HR 75 | Temp 98.4°F | Ht 66.0 in | Wt 213.5 lb

## 2018-03-19 DIAGNOSIS — G8929 Other chronic pain: Secondary | ICD-10-CM

## 2018-03-19 DIAGNOSIS — Z23 Encounter for immunization: Secondary | ICD-10-CM | POA: Diagnosis not present

## 2018-03-19 DIAGNOSIS — R5382 Chronic fatigue, unspecified: Secondary | ICD-10-CM | POA: Diagnosis not present

## 2018-03-19 DIAGNOSIS — M79645 Pain in left finger(s): Secondary | ICD-10-CM

## 2018-03-19 DIAGNOSIS — G43109 Migraine with aura, not intractable, without status migrainosus: Secondary | ICD-10-CM

## 2018-03-19 DIAGNOSIS — I69319 Unspecified symptoms and signs involving cognitive functions following cerebral infarction: Secondary | ICD-10-CM

## 2018-03-19 DIAGNOSIS — M1812 Unilateral primary osteoarthritis of first carpometacarpal joint, left hand: Secondary | ICD-10-CM | POA: Diagnosis not present

## 2018-03-19 DIAGNOSIS — I421 Obstructive hypertrophic cardiomyopathy: Secondary | ICD-10-CM | POA: Diagnosis not present

## 2018-03-19 DIAGNOSIS — R404 Transient alteration of awareness: Secondary | ICD-10-CM | POA: Diagnosis not present

## 2018-03-19 DIAGNOSIS — S0990XA Unspecified injury of head, initial encounter: Secondary | ICD-10-CM | POA: Insufficient documentation

## 2018-03-19 MED ORDER — ACETAMINOPHEN-CODEINE #3 300-30 MG PO TABS: 1.0000 | ORAL_TABLET | Freq: Three times a day (TID) | ORAL | 0 refills | Status: DC | PRN

## 2018-03-19 NOTE — Progress Notes (Signed)
BP 124/80 (BP Location: Left Arm, Patient Position: Sitting, Cuff Size: Large)   Pulse 75   Temp 98.4 F (36.9 C) (Oral)   Ht 5\' 6"  (1.676 m)   Wt 213 lb 8 oz (96.8 kg)   SpO2 97%   BMI 34.46 kg/m    CC: 3 mo f/u visit Subjective:    Patient ID: Dylan Fletcher, male    DOB: 08-Jun-1961, 57 y.o.   MRN: [id number]  HPI: Dylan Fletcher is a 57 y.o. male presenting on 03/19/2018 for 3 mo follow up (Wants to discuss Pepcid. Pt accompanied by his wife. )   Low testosterone - on clomid through [facility] Endo Dr Honor Junes. Discussing taper of clomid.   Recent cardiac evaluation 01/2018 (Dr Bobby Rumpf) - reassuring. Note reviewed.   Decided along with his neurologist (Dr Pamalee Leyden) not to take keppra. rec wear ear plug in left ear whenever going to church. Will check with aricept titration with him. Cleared to return to driving.   Coaching high school soccer goalies. Hit by soccer ball on the head last week - led to him hitting his right head against goal post - ended on the floor and has trouble remembering events that happened after this. He did drive home. Yesterday had persistent headaches, today feeling better. No headaches today.   Migraines - flexeril 5mg  ineffective for this. Tried wife's tylenol with codeine with benefit.   [place]   Longstanding L>R thumb pain - no numbness/weakness or paresthesias of hands.   Relevant past medical, surgical, family and social history reviewed and updated as indicated. Interim medical history since our last visit reviewed. Allergies and medications reviewed and updated. Outpatient Medications Prior to Visit  Medication Sig Dispense Refill  . acetaminophen (TYLENOL) 325 MG tablet Take 650 mg by mouth as needed.    Marland Kitchen amoxicillin (AMOXIL) 500 MG tablet Take 500 mg by mouth as directed. 1 BID; start 24 hours prior to dental procedure and 24 hours post dental procedure    . amphetamine-dextroamphetamine  (ADDERALL) 30 MG tablet Takes 30/30/15 for alertness  0  . cetirizine (ZYRTEC) 10 MG tablet Take 10 mg by mouth daily as needed.     . Cholecalciferol (VITAMIN D) 2000 UNITS CAPS Take 1 capsule by mouth daily.    . clomiPHENE (SEROPHENE) 50 MG tablet Take by mouth.    . donepezil (ARICEPT) 5 MG tablet Take 2 tablets (10 mg total) by mouth at bedtime.    . famotidine (PEPCID) 20 MG tablet Take 20 mg by mouth daily.    . furosemide (LASIX) 20 MG tablet Take 1 tablet (20 mg total) by mouth daily. With extra prn weight gain    . Glucosamine Sulfate 500 MG CAPS Take 1 capsule by mouth daily.     . metoprolol succinate (TOPROL-XL) 25 MG 24 hr tablet Take 1 tablet (25 mg total) by mouth daily.    . Multiple Vitamin (MULTIVITAMIN) tablet Take 1 tablet by mouth daily.    Marland Kitchen oxyCODONE-acetaminophen (PERCOCET/ROXICET) 5-325 MG per tablet Take 1 tablet by mouth every 4 (four) hours as needed for severe pain.    . sildenafil (VIAGRA) 100 MG tablet Take 1 tablet (100 mg total) by mouth daily as needed for erectile dysfunction. 10 tablet 3  . warfarin (COUMADIN) 5 MG tablet Take 5 mg by mouth as directed.     . cyclobenzaprine (FLEXERIL) 5 MG tablet Take 1 tablet (5 mg total) by  mouth daily as needed (bad headache). 20 tablet 0  . levETIRAcetam (KEPPRA) 1000 MG tablet Take by mouth.     No facility-administered medications prior to visit.      Per HPI unless specifically indicated in ROS section below Review of Systems     Objective:    BP 124/80 (BP Location: Left Arm, Patient Position: Sitting, Cuff Size: Large)   Pulse 75   Temp 98.4 F (36.9 C) (Oral)   Ht 5\' 6"  (1.676 m)   Wt 213 lb 8 oz (96.8 kg)   SpO2 97%   BMI 34.46 kg/m   Wt Readings from Last 3 Encounters:  03/19/18 213 lb 8 oz (96.8 kg)  12/14/17 217 lb 12 oz (98.8 kg)  08/30/17 213 lb (96.6 kg)    Physical Exam  Constitutional: He appears well-developed and well-nourished. No distress.  HENT:  Head: Normocephalic and  atraumatic.  Mouth/Throat: Oropharynx is clear and moist. No oropharyngeal exudate.  Eyes: Pupils are equal, round, and reactive to light. Conjunctivae and EOM are normal.  Neck: Normal range of motion. Neck supple. No thyromegaly present.  Cardiovascular: Normal rate and regular rhythm.  Murmur heard. Pulmonary/Chest: Effort normal and breath sounds normal. No respiratory distress. He has no wheezes. He has no rales.  Musculoskeletal: Normal range of motion. He exhibits no edema.  No pain to palpation at bilateral snuff box Neg finkelstein test Tender to palpation L 1st CMC and MCP joints  Lymphadenopathy:    He has no cervical adenopathy.  Neurological: He is alert. No cranial nerve deficit.  CN 2-12 intact EOMI without pain  Psychiatric: He has a normal mood and affect.  Nursing note and vitals reviewed.  Results for orders placed or performed in visit on 06/18/17  Lipid panel  Result Value Ref Range   Cholesterol 188 0 - 200 mg/dL   Triglycerides 166.0 (H) 0.0 - 149.0 mg/dL   HDL 38.00 (L) >39.00 mg/dL   VLDL 33.2 0.0 - 40.0 mg/dL   LDL Cholesterol 117 (H) 0 - 99 mg/dL   Total CHOL/HDL Ratio 5    NonHDL 150.29   PSA  Result Value Ref Range   PSA 1.51 0.10 - 4.00 ng/mL  VITAMIN D 25 Hydroxy (Vit-D Deficiency, Fractures)  Result Value Ref Range   VITD 38.57 30.00 - 100.00 ng/mL      Assessment & Plan:   Problem List Items Addressed This Visit    Transient alteration of awareness    Saw his neurologist - thought related to specific sound wave range in h/o meniere's, not seizure - no need for keppra. Improved with use of L ear plug at church.       Migraine with aura    Flexeril 5mg  ineffective - suggested try 10mg  at next migraine. If not helpful, may try tylenol #3 - sent to pharmacy. [place] CSRS reviewed      Relevant Medications   acetaminophen-codeine (TYLENOL #3) 300-30 MG tablet   Hypertrophic obstructive cardiomyopathy ([facility])   Head injury, acute, initial  encounter    Describes episode of head injury against goal post to R parietal scalp with some altered awareness around injury, in known cognitive impairment after stroke. He very well may have had concussion but seems to be recovering well. Reviewed brain rest and post-concussion care. Update if recurring symptoms.       Cognitive deficit as late effect of cerebrovascular accident (CVA)   Chronic pain of left thumb - Primary    Endorses longstanding  discomfort - suspect OA. Check L thumb films for baseline - discussed tylenol use. Pt sates he would be interested in hand eval if significant arthritis.      Relevant Medications   acetaminophen-codeine (TYLENOL #3) 300-30 MG tablet   Other Relevant Orders   DG Finger Thumb Left   Chronic fatigue    Other Visit Diagnoses    Need for influenza vaccination       Relevant Orders   Flu Vaccine QUAD 36+ mos IM (Completed)       Meds ordered this encounter  Medications  . acetaminophen-codeine (TYLENOL #3) 300-30 MG tablet    Sig: Take 1 tablet by mouth 3 (three) times daily as needed for moderate pain (headache).    Dispense:  15 tablet    Refill:  0   Orders Placed This Encounter  Procedures  . DG Finger Thumb Left    Standing Status:   Future    Number of Occurrences:   1    Standing Expiration Date:   05/20/2019    Order Specific Question:   Reason for Exam (SYMPTOM  OR DIAGNOSIS REQUIRED)    Answer:   chronic left thumb pain at [facility] and [facility]    Order Specific Question:   Preferred imaging location?    Answer:   [facility]    Order Specific Question:   Radiology Contrast Protocol - do NOT remove file path    Answer:   \\[email or web address]s.pdf  . Flu Vaccine QUAD 36+ mos IM    Follow up plan: Return in about 3 months (around 06/19/2018) for medicare wellness visit.  Ria Bush, MD

## 2018-03-19 NOTE — Assessment & Plan Note (Addendum)
Describes episode of head injury against goal post to R parietal scalp with some altered awareness around injury, in known cognitive impairment after stroke. He very well may have had concussion but seems to be recovering well. Reviewed brain rest and post-concussion care. Update if recurring symptoms.

## 2018-03-19 NOTE — Patient Instructions (Addendum)
Flu shot today Try 2 flexeril next time you get migraine. If this is ineffective, may try tylenol #3 sent to pharmacy.  Xray of left thumb today. If worsening pain, let us know for referral to hand doctor.  Return in 3 months for wellness visit

## 2018-03-19 NOTE — Assessment & Plan Note (Addendum)
Endorses longstanding discomfort - suspect OA. Check L thumb films for baseline - discussed tylenol use. Pt sates he would be interested in hand eval if significant arthritis.

## 2018-03-19 NOTE — Assessment & Plan Note (Signed)
Saw his neurologist - thought related to specific sound wave range in h/o meniere's, not seizure - no need for keppra. Improved with use of L ear plug at church.

## 2018-03-19 NOTE — Assessment & Plan Note (Signed)
Flexeril 5mg  ineffective - suggested try 10mg  at next migraine. If not helpful, may try tylenol #3 - sent to pharmacy. [place] CSRS reviewed

## 2018-03-27 DIAGNOSIS — M9902 Segmental and somatic dysfunction of thoracic region: Secondary | ICD-10-CM | POA: Diagnosis not present

## 2018-03-27 DIAGNOSIS — M5412 Radiculopathy, cervical region: Secondary | ICD-10-CM | POA: Diagnosis not present

## 2018-03-27 DIAGNOSIS — M9901 Segmental and somatic dysfunction of cervical region: Secondary | ICD-10-CM | POA: Diagnosis not present

## 2018-03-27 DIAGNOSIS — M62830 Muscle spasm of back: Secondary | ICD-10-CM | POA: Diagnosis not present

## 2018-04-11 DIAGNOSIS — I421 Obstructive hypertrophic cardiomyopathy: Secondary | ICD-10-CM | POA: Diagnosis not present

## 2018-04-11 DIAGNOSIS — Z6834 Body mass index (BMI) 34.0-34.9, adult: Secondary | ICD-10-CM | POA: Diagnosis not present

## 2018-04-11 DIAGNOSIS — Z952 Presence of prosthetic heart valve: Secondary | ICD-10-CM | POA: Diagnosis not present

## 2018-04-11 DIAGNOSIS — I480 Paroxysmal atrial fibrillation: Secondary | ICD-10-CM | POA: Diagnosis not present

## 2018-04-11 DIAGNOSIS — R55 Syncope and collapse: Secondary | ICD-10-CM | POA: Diagnosis not present

## 2018-04-11 DIAGNOSIS — W19XXXD Unspecified fall, subsequent encounter: Secondary | ICD-10-CM | POA: Diagnosis not present

## 2018-04-11 DIAGNOSIS — I440 Atrioventricular block, first degree: Secondary | ICD-10-CM | POA: Diagnosis not present

## 2018-04-22 DIAGNOSIS — Z79899 Other long term (current) drug therapy: Secondary | ICD-10-CM | POA: Diagnosis not present

## 2018-04-22 DIAGNOSIS — I69311 Memory deficit following cerebral infarction: Secondary | ICD-10-CM | POA: Diagnosis not present

## 2018-04-22 DIAGNOSIS — F411 Generalized anxiety disorder: Secondary | ICD-10-CM | POA: Diagnosis not present

## 2018-04-22 DIAGNOSIS — F900 Attention-deficit hyperactivity disorder, predominantly inattentive type: Secondary | ICD-10-CM | POA: Diagnosis not present

## 2018-04-22 DIAGNOSIS — F321 Major depressive disorder, single episode, moderate: Secondary | ICD-10-CM | POA: Diagnosis not present

## 2018-04-24 DIAGNOSIS — M9902 Segmental and somatic dysfunction of thoracic region: Secondary | ICD-10-CM | POA: Diagnosis not present

## 2018-04-24 DIAGNOSIS — M5412 Radiculopathy, cervical region: Secondary | ICD-10-CM | POA: Diagnosis not present

## 2018-04-24 DIAGNOSIS — M62830 Muscle spasm of back: Secondary | ICD-10-CM | POA: Diagnosis not present

## 2018-04-24 DIAGNOSIS — M9901 Segmental and somatic dysfunction of cervical region: Secondary | ICD-10-CM | POA: Diagnosis not present

## 2018-05-07 DIAGNOSIS — Z952 Presence of prosthetic heart valve: Secondary | ICD-10-CM | POA: Diagnosis not present

## 2018-05-20 DIAGNOSIS — Z952 Presence of prosthetic heart valve: Secondary | ICD-10-CM | POA: Diagnosis not present

## 2018-05-27 DIAGNOSIS — Z7901 Long term (current) use of anticoagulants: Secondary | ICD-10-CM | POA: Diagnosis not present

## 2018-05-27 DIAGNOSIS — Z952 Presence of prosthetic heart valve: Secondary | ICD-10-CM | POA: Diagnosis not present

## 2018-05-28 DIAGNOSIS — M9902 Segmental and somatic dysfunction of thoracic region: Secondary | ICD-10-CM | POA: Diagnosis not present

## 2018-05-28 DIAGNOSIS — M62830 Muscle spasm of back: Secondary | ICD-10-CM | POA: Diagnosis not present

## 2018-05-28 DIAGNOSIS — M5412 Radiculopathy, cervical region: Secondary | ICD-10-CM | POA: Diagnosis not present

## 2018-05-28 DIAGNOSIS — M9901 Segmental and somatic dysfunction of cervical region: Secondary | ICD-10-CM | POA: Diagnosis not present

## 2018-06-10 DIAGNOSIS — M9901 Segmental and somatic dysfunction of cervical region: Secondary | ICD-10-CM | POA: Diagnosis not present

## 2018-06-10 DIAGNOSIS — M9902 Segmental and somatic dysfunction of thoracic region: Secondary | ICD-10-CM | POA: Diagnosis not present

## 2018-06-10 DIAGNOSIS — M5412 Radiculopathy, cervical region: Secondary | ICD-10-CM | POA: Diagnosis not present

## 2018-06-10 DIAGNOSIS — M62830 Muscle spasm of back: Secondary | ICD-10-CM | POA: Diagnosis not present

## 2018-06-11 DIAGNOSIS — M62830 Muscle spasm of back: Secondary | ICD-10-CM | POA: Diagnosis not present

## 2018-06-11 DIAGNOSIS — M9901 Segmental and somatic dysfunction of cervical region: Secondary | ICD-10-CM | POA: Diagnosis not present

## 2018-06-11 DIAGNOSIS — M5412 Radiculopathy, cervical region: Secondary | ICD-10-CM | POA: Diagnosis not present

## 2018-06-11 DIAGNOSIS — M9902 Segmental and somatic dysfunction of thoracic region: Secondary | ICD-10-CM | POA: Diagnosis not present

## 2018-06-14 DIAGNOSIS — M9902 Segmental and somatic dysfunction of thoracic region: Secondary | ICD-10-CM | POA: Diagnosis not present

## 2018-06-14 DIAGNOSIS — M9901 Segmental and somatic dysfunction of cervical region: Secondary | ICD-10-CM | POA: Diagnosis not present

## 2018-06-14 DIAGNOSIS — M62830 Muscle spasm of back: Secondary | ICD-10-CM | POA: Diagnosis not present

## 2018-06-14 DIAGNOSIS — M5412 Radiculopathy, cervical region: Secondary | ICD-10-CM | POA: Diagnosis not present

## 2018-06-17 DIAGNOSIS — M5412 Radiculopathy, cervical region: Secondary | ICD-10-CM | POA: Diagnosis not present

## 2018-06-17 DIAGNOSIS — M62830 Muscle spasm of back: Secondary | ICD-10-CM | POA: Diagnosis not present

## 2018-06-17 DIAGNOSIS — M9902 Segmental and somatic dysfunction of thoracic region: Secondary | ICD-10-CM | POA: Diagnosis not present

## 2018-06-17 DIAGNOSIS — H9319 Tinnitus, unspecified ear: Secondary | ICD-10-CM | POA: Diagnosis not present

## 2018-06-17 DIAGNOSIS — M9901 Segmental and somatic dysfunction of cervical region: Secondary | ICD-10-CM | POA: Diagnosis not present

## 2018-06-17 DIAGNOSIS — Z7901 Long term (current) use of anticoagulants: Secondary | ICD-10-CM | POA: Diagnosis not present

## 2018-06-17 DIAGNOSIS — H8102 Meniere's disease, left ear: Secondary | ICD-10-CM | POA: Diagnosis not present

## 2018-06-17 DIAGNOSIS — Z952 Presence of prosthetic heart valve: Secondary | ICD-10-CM | POA: Diagnosis not present

## 2018-06-18 DIAGNOSIS — Z952 Presence of prosthetic heart valve: Secondary | ICD-10-CM | POA: Diagnosis not present

## 2018-06-20 ENCOUNTER — Other Ambulatory Visit: Payer: Self-pay | Admitting: Family Medicine

## 2018-06-20 DIAGNOSIS — E559 Vitamin D deficiency, unspecified: Secondary | ICD-10-CM

## 2018-06-20 DIAGNOSIS — Z125 Encounter for screening for malignant neoplasm of prostate: Secondary | ICD-10-CM

## 2018-06-20 DIAGNOSIS — Z8679 Personal history of other diseases of the circulatory system: Secondary | ICD-10-CM

## 2018-06-20 DIAGNOSIS — I10 Essential (primary) hypertension: Secondary | ICD-10-CM

## 2018-06-20 DIAGNOSIS — E785 Hyperlipidemia, unspecified: Secondary | ICD-10-CM

## 2018-06-21 ENCOUNTER — Other Ambulatory Visit (INDEPENDENT_AMBULATORY_CARE_PROVIDER_SITE_OTHER): Payer: Medicare Other

## 2018-06-21 DIAGNOSIS — Z8679 Personal history of other diseases of the circulatory system: Secondary | ICD-10-CM

## 2018-06-21 DIAGNOSIS — Z125 Encounter for screening for malignant neoplasm of prostate: Secondary | ICD-10-CM | POA: Diagnosis not present

## 2018-06-21 DIAGNOSIS — E785 Hyperlipidemia, unspecified: Secondary | ICD-10-CM

## 2018-06-21 DIAGNOSIS — E559 Vitamin D deficiency, unspecified: Secondary | ICD-10-CM

## 2018-06-21 DIAGNOSIS — I10 Essential (primary) hypertension: Secondary | ICD-10-CM

## 2018-06-21 LAB — COMPREHENSIVE METABOLIC PANEL
ALT: 23 U/L (ref 0–53)
AST: 25 U/L (ref 0–37)
Albumin: 4 g/dL (ref 3.5–5.2)
Alkaline Phosphatase: 34 U/L — ABNORMAL LOW (ref 39–117)
BUN: 23 mg/dL (ref 6–23)
CO2: 28 mEq/L (ref 19–32)
Calcium: 9.2 mg/dL (ref 8.4–10.5)
Chloride: 105 mEq/L (ref 96–112)
Creatinine, Ser: 1.13 mg/dL (ref 0.40–1.50)
GFR: 70.51 mL/min (ref >60.00)
Glucose, Bld: 97 mg/dL (ref 70–99)
Potassium: 4.4 mEq/L (ref 3.5–5.1)
SODIUM: 141 meq/L (ref 135–145)
Total Bilirubin: 0.3 mg/dL (ref 0.2–1.2)
Total Protein: 6.3 g/dL (ref 6.0–8.3)

## 2018-06-21 LAB — CBC WITH DIFFERENTIAL/PLATELET
Basophils Absolute: 0.1 K/uL (ref 0.0–0.1)
Basophils Relative: 0.9 % (ref 0.0–3.0)
Eosinophils Absolute: 0.2 K/uL (ref 0.0–0.7)
Eosinophils Relative: 2.3 % (ref 0.0–5.0)
HCT: 44.7 % (ref 39.0–52.0)
Hemoglobin: 15.6 g/dL (ref 13.0–17.0)
Lymphocytes Relative: 29.3 % (ref 12.0–46.0)
Lymphs Abs: 2.6 K/uL (ref 0.7–4.0)
MCHC: 34.8 g/dL (ref 30.0–36.0)
MCV: 90.4 fl (ref 78.0–100.0)
Monocytes Absolute: 0.8 K/uL (ref 0.1–1.0)
Monocytes Relative: 8.6 % (ref 3.0–12.0)
Neutro Abs: 5.2 K/uL (ref 1.4–7.7)
Neutrophils Relative %: 58.9 % (ref 43.0–77.0)
Platelets: 253 K/uL (ref 150.0–400.0)
RBC: 4.94 Mil/uL (ref 4.22–5.81)
RDW: 13.7 % (ref 11.5–15.5)
WBC: 8.8 K/uL (ref 4.0–10.5)

## 2018-06-21 LAB — LIPID PANEL
Cholesterol: 213 mg/dL — ABNORMAL HIGH (ref 0–200)
HDL: 26 mg/dL — ABNORMAL LOW
Total CHOL/HDL Ratio: 8
Triglycerides: 976 mg/dL — ABNORMAL HIGH (ref 0.0–149.0)

## 2018-06-21 LAB — VITAMIN D 25 HYDROXY (VIT D DEFICIENCY, FRACTURES): VITD: 31.99 ng/mL (ref 30.00–100.00)

## 2018-06-21 LAB — PSA, MEDICARE: PSA: 0.92 ng/mL (ref 0.10–4.00)

## 2018-06-21 LAB — MICROALBUMIN / CREATININE URINE RATIO
Creatinine,U: 167.4 mg/dL
Microalb Creat Ratio: 0.4 mg/g (ref 0.0–30.0)
Microalb, Ur: <0.7 mg/dL (ref 0.0–1.9)

## 2018-06-21 LAB — LDL CHOLESTEROL, DIRECT: Direct LDL: 88 mg/dL

## 2018-06-21 LAB — TSH: TSH: 0.94 u[IU]/mL (ref 0.35–4.50)

## 2018-06-24 ENCOUNTER — Ambulatory Visit (INDEPENDENT_AMBULATORY_CARE_PROVIDER_SITE_OTHER): Payer: Medicare Other | Admitting: Family Medicine

## 2018-06-24 ENCOUNTER — Encounter: Payer: Self-pay | Admitting: Family Medicine

## 2018-06-24 VITALS — BP 132/80 | HR 75 | Temp 97.8°F | Ht 65.75 in | Wt 217.5 lb

## 2018-06-24 DIAGNOSIS — E559 Vitamin D deficiency, unspecified: Secondary | ICD-10-CM

## 2018-06-24 DIAGNOSIS — I6930 Unspecified sequelae of cerebral infarction: Secondary | ICD-10-CM | POA: Diagnosis not present

## 2018-06-24 DIAGNOSIS — I421 Obstructive hypertrophic cardiomyopathy: Secondary | ICD-10-CM | POA: Diagnosis not present

## 2018-06-24 DIAGNOSIS — E785 Hyperlipidemia, unspecified: Secondary | ICD-10-CM

## 2018-06-24 DIAGNOSIS — Z6835 Body mass index (BMI) 35.0-35.9, adult: Secondary | ICD-10-CM | POA: Diagnosis not present

## 2018-06-24 DIAGNOSIS — Z7189 Other specified counseling: Secondary | ICD-10-CM | POA: Diagnosis not present

## 2018-06-24 DIAGNOSIS — Z0000 Encounter for general adult medical examination without abnormal findings: Secondary | ICD-10-CM | POA: Insufficient documentation

## 2018-06-24 DIAGNOSIS — I69319 Unspecified symptoms and signs involving cognitive functions following cerebral infarction: Secondary | ICD-10-CM

## 2018-06-24 DIAGNOSIS — G4733 Obstructive sleep apnea (adult) (pediatric): Secondary | ICD-10-CM

## 2018-06-24 LAB — LIPID PANEL
Cholesterol: 186 mg/dL (ref 0–200)
HDL: 32.8 mg/dL — ABNORMAL LOW (ref >39.00)
NonHDL: 153.38
Total CHOL/HDL Ratio: 6
Triglycerides: 296 mg/dL — ABNORMAL HIGH (ref 0.0–149.0)
VLDL: 59.2 mg/dL — ABNORMAL HIGH (ref 0.0–40.0)

## 2018-06-24 LAB — LDL CHOLESTEROL, DIRECT: Direct LDL: 97 mg/dL

## 2018-06-24 NOTE — Assessment & Plan Note (Signed)
S/p surgery. Regularly sees cards.

## 2018-06-24 NOTE — Assessment & Plan Note (Addendum)
Encouraged continued activity to help with ongoing sustainable weight loss.

## 2018-06-24 NOTE — Assessment & Plan Note (Signed)

## 2018-06-24 NOTE — Assessment & Plan Note (Signed)
Chronic, stable. Continue 2000 IU daily.

## 2018-06-24 NOTE — Assessment & Plan Note (Signed)
Not on statin. Triglycerides markedly elevated - may have had bacon night before blood draw - will recheck today. Discussed possibly starting fibrate if trig remain markedly elevated.

## 2018-06-24 NOTE — Assessment & Plan Note (Signed)
Continue CPAP.  

## 2018-06-24 NOTE — Assessment & Plan Note (Signed)
Advanced directive planning: discussed, does not have set up. Will provide packet today. Would want wife to be HCPOA.

## 2018-06-24 NOTE — Progress Notes (Signed)
BP 132/80 (BP Location: Left Arm, Patient Position: Sitting, Cuff Size: Normal)   Pulse 75   Temp 97.8 F (36.6 C) (Oral)   Ht 5' 5.75" (1.67 m)   Wt 217 lb 8 oz (98.7 kg)   SpO2 99%   BMI 35.37 kg/m    CC: medicare wellness visit Subjective:    Patient ID: Dylan Fletcher, male    DOB: 1960/11/16, 58 y.o.   MRN: [id number]  HPI: Dylan Fletcher is a 59 y.o. male presenting on 06/24/2018 for Medicare Wellness (Pt accompanied by his wife, [name].)    Did not see Katha Cabal this year. Planning trip to [place] to visit father.  Yearly long term disability review.   H/o L thalamic stroke 09/2013 after MAZE myomectomy and MVR for HOCM and afib. Planned annual monitoring of basilar artery aneurysm. Last saw cardiology 03/2018.   Sees counselor weekly.   Passes fall screen Passes depression screen  Hearing Screening   125Hz  250Hz  500Hz  1000Hz  2000Hz  3000Hz  4000Hz  6000Hz  8000Hz   Right ear:   20 40 20  25    Left ear:   0 20 20  20       Visual Acuity Screening   Right eye Left eye Both eyes  Without correction:     With correction: 20/25 20/25 20/20      Preventative: COLONOSCOPY Date: 04/2014 mild diverticulosis, no polyps, rec rpt 5 yrs [facility]) Prostate cancer screening - requests continued screening. Nocturia x1.  Fluyearly  Tdap 01/2013  Pneumovax2016  shingrix - discussed, will check at local pharmacy. Advanced directive planning: discussed, does not have set up. Will provide packet today. Would want wife to be HCPOA.  Seat belt use discussed  Sunscreen use discussed. No changing moles on skin. Non smoker  Alcohol - rare Dentist q4 mo Eye exam yearly   Caffeine: cutting back - 12-14 oz soda/day  Lives with wife, 3 cats  Occupation: Nurse, children's  Edu: Bachelor's degree  Activity: no regular exercise  Diet: good water, fruits/vegetables daily     Relevant past medical, surgical, family and social history reviewed and updated as indicated. Interim medical history  since our last visit reviewed. Allergies and medications reviewed and updated. Outpatient Medications Prior to Visit  Medication Sig Dispense Refill  . acetaminophen (TYLENOL) 325 MG tablet Take 650 mg by mouth as needed.    Marland Kitchen acetaminophen-codeine (TYLENOL #3) 300-30 MG tablet Take 1 tablet by mouth 3 (three) times daily as needed for moderate pain (headache). 15 tablet 0  . amoxicillin (AMOXIL) 500 MG tablet Take 500 mg by mouth as directed. 1 BID; start 24 hours prior to dental procedure and 24 hours post dental procedure    . amphetamine-dextroamphetamine (ADDERALL) 30 MG tablet Takes 30/30/15 for alertness  0  . cetirizine (ZYRTEC) 10 MG tablet Take 10 mg by mouth daily as needed.     . Cholecalciferol (VITAMIN D) 2000 UNITS CAPS Take 1 capsule by mouth daily.    . clomiPHENE (SEROPHENE) 50 MG tablet Take by mouth.    . famotidine (PEPCID) 20 MG tablet Take 20 mg by mouth daily.    . furosemide (LASIX) 20 MG tablet Take 1 tablet (20 mg total) by mouth daily. With extra prn weight gain    . Glucosamine Sulfate 500 MG CAPS Take 1 capsule by mouth daily.     . metoprolol succinate (TOPROL-XL) 25 MG 24 hr tablet Take 1 tablet (25 mg total) by mouth daily.    . Multiple Vitamin (  MULTIVITAMIN) tablet Take 1 tablet by mouth daily.    Marland Kitchen oxyCODONE-acetaminophen (PERCOCET/ROXICET) 5-325 MG per tablet Take 1 tablet by mouth every 4 (four) hours as needed for severe pain.    . sildenafil (VIAGRA) 100 MG tablet Take 1 tablet (100 mg total) by mouth daily as needed for erectile dysfunction. 10 tablet 3  . warfarin (COUMADIN) 5 MG tablet Take 5 mg by mouth as directed.     . donepezil (ARICEPT) 5 MG tablet Take 2 tablets (10 mg total) by mouth at bedtime.     No facility-administered medications prior to visit.      Per HPI unless specifically indicated in ROS section below Review of Systems   Objective:    BP 132/80 (BP Location: Left Arm, Patient Position: Sitting, Cuff Size: Normal)   Pulse 75    Temp 97.8 F (36.6 C) (Oral)   Ht 5' 5.75" (1.67 m)   Wt 217 lb 8 oz (98.7 kg)   SpO2 99%   BMI 35.37 kg/m   Wt Readings from Last 3 Encounters:  06/24/18 217 lb 8 oz (98.7 kg)  03/19/18 213 lb 8 oz (96.8 kg)  12/14/17 217 lb 12 oz (98.8 kg)    Physical Exam Vitals signs and nursing note reviewed.  Constitutional:      General: He is not in acute distress.    Appearance: He is well-developed.  HENT:     Head: Normocephalic and atraumatic.     Right Ear: Hearing, tympanic membrane, ear canal and external ear normal.     Left Ear: Hearing, tympanic membrane, ear canal and external ear normal.     Nose: Nose normal.     Mouth/Throat:     Pharynx: Uvula midline. No oropharyngeal exudate or posterior oropharyngeal erythema.  Eyes:     General: No scleral icterus.    Conjunctiva/sclera: Conjunctivae normal.     Pupils: Pupils are equal, round, and reactive to light.  Neck:     Musculoskeletal: Normal range of motion and neck supple.  Cardiovascular:     Rate and Rhythm: Normal rate and regular rhythm.     Pulses:          Radial pulses are 2+ on the right side and 2+ on the left side.     Heart sounds: Normal heart sounds. No murmur.  Pulmonary:     Effort: Pulmonary effort is normal. No respiratory distress.     Breath sounds: Normal breath sounds. No wheezing or rales.  Abdominal:     General: Bowel sounds are normal. There is no distension.     Palpations: Abdomen is soft. There is no mass.     Tenderness: There is no abdominal tenderness. There is no guarding or rebound.  Genitourinary:    Prostate: Normal. Not enlarged (15gm), not tender and no nodules present.     Rectum: Normal. No mass, tenderness, anal fissure, external hemorrhoid or internal hemorrhoid. Normal anal tone.  Musculoskeletal: Normal range of motion.  Lymphadenopathy:     Cervical: No cervical adenopathy.  Skin:    General: Skin is warm and dry.     Findings: No rash.  Neurological:     Mental  Status: He is alert and oriented to person, place, and time.     Comments: CN grossly intact, station and gait intact Recall 1/3 - known short term memory loss, does better with cues Calculation 5/5 serial 3s  Psychiatric:        Behavior: Behavior normal.  Thought Content: Thought content normal.        Judgment: Judgment normal.       Results for orders placed or performed in visit on 06/21/18  PSA, Medicare  Result Value Ref Range   PSA 0.92 0.10 - 4.00 ng/ml  Microalbumin / creatinine urine ratio  Result Value Ref Range   Microalb, Ur <0.7 0.0 - 1.9 mg/dL   Creatinine,U 167.4 mg/dL   Microalb Creat Ratio 0.4 0.0 - 30.0 mg/g  VITAMIN D 25 Hydroxy (Vit-D Deficiency, Fractures)  Result Value Ref Range   VITD 31.99 30.00 - 100.00 ng/mL  CBC with Differential/Platelet  Result Value Ref Range   WBC 8.8 4.0 - 10.5 K/uL   RBC 4.94 4.22 - 5.81 Mil/uL   Hemoglobin 15.6 13.0 - 17.0 g/dL   HCT 44.7 39.0 - 52.0 %   MCV 90.4 78.0 - 100.0 fl   MCHC 34.8 30.0 - 36.0 g/dL   RDW 13.7 11.5 - 15.5 %   Platelets 253.0 150.0 - 400.0 K/uL   Neutrophils Relative % 58.9 43.0 - 77.0 %   Lymphocytes Relative 29.3 12.0 - 46.0 %   Monocytes Relative 8.6 3.0 - 12.0 %   Eosinophils Relative 2.3 0.0 - 5.0 %   Basophils Relative 0.9 0.0 - 3.0 %   Neutro Abs 5.2 1.4 - 7.7 K/uL   Lymphs Abs 2.6 0.7 - 4.0 K/uL   Monocytes Absolute 0.8 0.1 - 1.0 K/uL   Eosinophils Absolute 0.2 0.0 - 0.7 K/uL   Basophils Absolute 0.1 0.0 - 0.1 K/uL  TSH  Result Value Ref Range   TSH 0.94 0.35 - 4.50 uIU/mL  Lipid panel  Result Value Ref Range   Cholesterol 213 (H) 0 - 200 mg/dL   Triglycerides (H) 0.0 - 149.0 mg/dL    976.0 Triglyceride is over 400; calculations on Lipids are invalid.   HDL 26.00 (L) >39.00 mg/dL   Total CHOL/HDL Ratio 8   Comprehensive metabolic panel  Result Value Ref Range   Sodium 141 135 - 145 mEq/L   Potassium 4.4 3.5 - 5.1 mEq/L   Chloride 105 96 - 112 mEq/L   CO2 28 19 - 32  mEq/L   Glucose, Bld 97 70 - 99 mg/dL   BUN 23 6 - 23 mg/dL   Creatinine, Ser 1.13 0.40 - 1.50 mg/dL   Total Bilirubin 0.3 0.2 - 1.2 mg/dL   Alkaline Phosphatase 34 (L) 39 - 117 U/L   AST 25 0 - 37 U/L   ALT 23 0 - 53 U/L   Total Protein 6.3 6.0 - 8.3 g/dL   Albumin 4.0 3.5 - 5.2 g/dL   Calcium 9.2 8.4 - 10.5 mg/dL   GFR 70.51 >60.00 mL/min  LDL cholesterol, direct  Result Value Ref Range   Direct LDL 88.0 mg/dL   Assessment & Plan:   Problem List Items Addressed This Visit    Vitamin D deficiency    Chronic, stable. Continue 2000 IU daily.       Severe obesity (BMI 35.0-35.9 with comorbidity) ([place])    Encouraged continued activity to help with ongoing sustainable weight loss.       OSA (obstructive sleep apnea)    Continue CPAP      Medicare annual wellness visit, initial - Primary    I have personally reviewed the Medicare Annual Wellness questionnaire and have noted 1. The patient's medical and social history 2. Their use of alcohol, tobacco or illicit drugs 3. Their current medications and  supplements 4. The patient's functional ability including ADL's, fall risks, home safety risks and hearing or visual impairment. Cognitive function has been assessed and addressed as indicated.  5. Diet and physical activity 6. Evidence for depression or mood disorders The patients weight, height, BMI have been recorded in the chart. I have made referrals, counseling and provided education to the patient based on review of the above and I have provided the pt with a written personalized care plan for preventive services. Provider list updated.. See scanned questionairre as needed for further documentation. Reviewed preventative protocols and updated unless pt declined.       Hypertrophic obstructive cardiomyopathy ([place])    S/p surgery. Regularly sees cards.       HLD (hyperlipidemia)    Not on statin. Triglycerides markedly elevated - may have had bacon night before blood draw  - will recheck today. Discussed possibly starting fibrate if trig remain markedly elevated.       Relevant Orders   Lipid panel   History of cerebrovascular accident (CVA) with residual deficit   Cognitive deficit as late effect of cerebrovascular accident (CVA)   Advanced directives, counseling/discussion    Advanced directive planning: discussed, does not have set up. Will provide packet today. Would want wife to be HCPOA.           No orders of the defined types were placed in this encounter.  Orders Placed This Encounter  Procedures  . Lipid panel    Follow up plan: Return in about 4 months (around 10/23/2018) for follow up visit.  Ria Bush, MD

## 2018-06-24 NOTE — Patient Instructions (Addendum)
If interested, check with pharmacy about new 2 shot shingles series (shingrix).  Advanced directive packet provided today.  Recheck labs today (triglycerides). Good to see you today. Return in 4 months for follow up visit.

## 2018-07-03 ENCOUNTER — Encounter: Payer: Self-pay | Admitting: Family Medicine

## 2018-07-05 DIAGNOSIS — Z952 Presence of prosthetic heart valve: Secondary | ICD-10-CM | POA: Diagnosis not present

## 2018-07-08 DIAGNOSIS — M5412 Radiculopathy, cervical region: Secondary | ICD-10-CM | POA: Diagnosis not present

## 2018-07-08 DIAGNOSIS — M62830 Muscle spasm of back: Secondary | ICD-10-CM | POA: Diagnosis not present

## 2018-07-08 DIAGNOSIS — M9901 Segmental and somatic dysfunction of cervical region: Secondary | ICD-10-CM | POA: Diagnosis not present

## 2018-07-08 DIAGNOSIS — M9902 Segmental and somatic dysfunction of thoracic region: Secondary | ICD-10-CM | POA: Diagnosis not present

## 2018-07-18 DIAGNOSIS — Z7901 Long term (current) use of anticoagulants: Secondary | ICD-10-CM | POA: Diagnosis not present

## 2018-07-18 DIAGNOSIS — Z952 Presence of prosthetic heart valve: Secondary | ICD-10-CM | POA: Diagnosis not present

## 2018-07-24 DIAGNOSIS — Z79899 Other long term (current) drug therapy: Secondary | ICD-10-CM | POA: Diagnosis not present

## 2018-07-24 DIAGNOSIS — I69311 Memory deficit following cerebral infarction: Secondary | ICD-10-CM | POA: Diagnosis not present

## 2018-07-24 DIAGNOSIS — F321 Major depressive disorder, single episode, moderate: Secondary | ICD-10-CM | POA: Diagnosis not present

## 2018-07-24 DIAGNOSIS — F900 Attention-deficit hyperactivity disorder, predominantly inattentive type: Secondary | ICD-10-CM | POA: Diagnosis not present

## 2018-07-24 DIAGNOSIS — F411 Generalized anxiety disorder: Secondary | ICD-10-CM | POA: Diagnosis not present

## 2018-08-01 DIAGNOSIS — R002 Palpitations: Secondary | ICD-10-CM | POA: Diagnosis not present

## 2018-08-01 DIAGNOSIS — K635 Polyp of colon: Secondary | ICD-10-CM | POA: Diagnosis not present

## 2018-08-01 DIAGNOSIS — Z6835 Body mass index (BMI) 35.0-35.9, adult: Secondary | ICD-10-CM | POA: Diagnosis not present

## 2018-08-01 DIAGNOSIS — Z952 Presence of prosthetic heart valve: Secondary | ICD-10-CM | POA: Diagnosis not present

## 2018-08-01 DIAGNOSIS — I480 Paroxysmal atrial fibrillation: Secondary | ICD-10-CM | POA: Diagnosis not present

## 2018-08-01 DIAGNOSIS — I493 Ventricular premature depolarization: Secondary | ICD-10-CM | POA: Diagnosis not present

## 2018-08-01 DIAGNOSIS — Z8673 Personal history of transient ischemic attack (TIA), and cerebral infarction without residual deficits: Secondary | ICD-10-CM | POA: Diagnosis not present

## 2018-08-05 DIAGNOSIS — M62830 Muscle spasm of back: Secondary | ICD-10-CM | POA: Diagnosis not present

## 2018-08-05 DIAGNOSIS — M9901 Segmental and somatic dysfunction of cervical region: Secondary | ICD-10-CM | POA: Diagnosis not present

## 2018-08-05 DIAGNOSIS — M9902 Segmental and somatic dysfunction of thoracic region: Secondary | ICD-10-CM | POA: Diagnosis not present

## 2018-08-05 DIAGNOSIS — M5412 Radiculopathy, cervical region: Secondary | ICD-10-CM | POA: Diagnosis not present

## 2018-08-07 DIAGNOSIS — E291 Testicular hypofunction: Secondary | ICD-10-CM | POA: Diagnosis not present

## 2018-08-14 DIAGNOSIS — E291 Testicular hypofunction: Secondary | ICD-10-CM | POA: Diagnosis not present

## 2018-08-23 DIAGNOSIS — L820 Inflamed seborrheic keratosis: Secondary | ICD-10-CM | POA: Diagnosis not present

## 2018-08-23 DIAGNOSIS — D229 Melanocytic nevi, unspecified: Secondary | ICD-10-CM | POA: Diagnosis not present

## 2018-08-23 DIAGNOSIS — D489 Neoplasm of uncertain behavior, unspecified: Secondary | ICD-10-CM | POA: Diagnosis not present

## 2018-08-23 DIAGNOSIS — L821 Other seborrheic keratosis: Secondary | ICD-10-CM | POA: Diagnosis not present

## 2018-09-05 ENCOUNTER — Encounter: Payer: Self-pay | Admitting: Family Medicine

## 2018-09-19 DIAGNOSIS — Z952 Presence of prosthetic heart valve: Secondary | ICD-10-CM | POA: Diagnosis not present

## 2018-10-01 DIAGNOSIS — Z952 Presence of prosthetic heart valve: Secondary | ICD-10-CM | POA: Diagnosis not present

## 2018-10-21 ENCOUNTER — Encounter: Payer: Self-pay | Admitting: Family Medicine

## 2018-10-21 DIAGNOSIS — F321 Major depressive disorder, single episode, moderate: Secondary | ICD-10-CM | POA: Diagnosis not present

## 2018-10-21 DIAGNOSIS — F900 Attention-deficit hyperactivity disorder, predominantly inattentive type: Secondary | ICD-10-CM | POA: Diagnosis not present

## 2018-10-21 DIAGNOSIS — I69311 Memory deficit following cerebral infarction: Secondary | ICD-10-CM | POA: Diagnosis not present

## 2018-10-21 DIAGNOSIS — F411 Generalized anxiety disorder: Secondary | ICD-10-CM | POA: Diagnosis not present

## 2018-10-21 DIAGNOSIS — Z79899 Other long term (current) drug therapy: Secondary | ICD-10-CM | POA: Diagnosis not present

## 2018-10-21 NOTE — Telephone Encounter (Signed)
Noted. Thanks.

## 2018-10-21 NOTE — Telephone Encounter (Signed)
Scheduled pt for fasting labs tomorrow at 10:30 for 4 mo f/u. Need orders.   Also, inform pt and wife, [name], 10/25/18 visit it a virtual visit and I will call about 30 mis before appt to get necessary info. Verbalized understanding.

## 2018-10-22 ENCOUNTER — Other Ambulatory Visit: Payer: Self-pay | Admitting: Family Medicine

## 2018-10-22 ENCOUNTER — Other Ambulatory Visit: Payer: Self-pay

## 2018-10-22 ENCOUNTER — Other Ambulatory Visit (INDEPENDENT_AMBULATORY_CARE_PROVIDER_SITE_OTHER): Payer: Medicare Other

## 2018-10-22 DIAGNOSIS — E559 Vitamin D deficiency, unspecified: Secondary | ICD-10-CM

## 2018-10-22 DIAGNOSIS — Z125 Encounter for screening for malignant neoplasm of prostate: Secondary | ICD-10-CM

## 2018-10-22 DIAGNOSIS — E785 Hyperlipidemia, unspecified: Secondary | ICD-10-CM | POA: Diagnosis not present

## 2018-10-22 DIAGNOSIS — R5382 Chronic fatigue, unspecified: Secondary | ICD-10-CM

## 2018-10-22 DIAGNOSIS — I10 Essential (primary) hypertension: Secondary | ICD-10-CM

## 2018-10-22 LAB — COMPREHENSIVE METABOLIC PANEL
ALT: 26 U/L (ref 0–53)
AST: 28 U/L (ref 0–37)
Albumin: 4.1 g/dL (ref 3.5–5.2)
Alkaline Phosphatase: 37 U/L — ABNORMAL LOW (ref 39–117)
BUN: 17 mg/dL (ref 6–23)
CO2: 27 mEq/L (ref 19–32)
Calcium: 8.9 mg/dL (ref 8.4–10.5)
Chloride: 104 mEq/L (ref 96–112)
Creatinine, Ser: 1.18 mg/dL (ref 0.40–1.50)
GFR: 63.03 mL/min (ref >60.00)
Glucose, Bld: 98 mg/dL (ref 70–99)
Potassium: 4.4 mEq/L (ref 3.5–5.1)
Sodium: 137 mEq/L (ref 135–145)
Total Bilirubin: 0.4 mg/dL (ref 0.2–1.2)
Total Protein: 6.6 g/dL (ref 6.0–8.3)

## 2018-10-22 LAB — LIPID PANEL
Cholesterol: 204 mg/dL — ABNORMAL HIGH (ref 0–200)
HDL: 23.9 mg/dL — ABNORMAL LOW (ref >39.00)
Total CHOL/HDL Ratio: 9
Triglycerides: 813 mg/dL — ABNORMAL HIGH (ref 0.0–149.0)

## 2018-10-22 LAB — LDL CHOLESTEROL, DIRECT: Direct LDL: 62 mg/dL

## 2018-10-23 ENCOUNTER — Encounter: Payer: Self-pay | Admitting: Family Medicine

## 2018-10-25 ENCOUNTER — Encounter: Payer: Self-pay | Admitting: Family Medicine

## 2018-10-25 ENCOUNTER — Ambulatory Visit (INDEPENDENT_AMBULATORY_CARE_PROVIDER_SITE_OTHER): Payer: Medicare Other | Admitting: Family Medicine

## 2018-10-25 VITALS — BP 139/85 | HR 75 | Temp 96.3°F | Ht 65.75 in | Wt 220.0 lb

## 2018-10-25 DIAGNOSIS — I421 Obstructive hypertrophic cardiomyopathy: Secondary | ICD-10-CM | POA: Diagnosis not present

## 2018-10-25 DIAGNOSIS — E781 Pure hyperglyceridemia: Secondary | ICD-10-CM | POA: Diagnosis not present

## 2018-10-25 DIAGNOSIS — Z7901 Long term (current) use of anticoagulants: Secondary | ICD-10-CM | POA: Diagnosis not present

## 2018-10-25 DIAGNOSIS — E785 Hyperlipidemia, unspecified: Secondary | ICD-10-CM

## 2018-10-25 DIAGNOSIS — E291 Testicular hypofunction: Secondary | ICD-10-CM | POA: Diagnosis not present

## 2018-10-25 DIAGNOSIS — F418 Other specified anxiety disorders: Secondary | ICD-10-CM | POA: Diagnosis not present

## 2018-10-25 DIAGNOSIS — I69319 Unspecified symptoms and signs involving cognitive functions following cerebral infarction: Secondary | ICD-10-CM | POA: Diagnosis not present

## 2018-10-25 DIAGNOSIS — I6930 Unspecified sequelae of cerebral infarction: Secondary | ICD-10-CM

## 2018-10-25 DIAGNOSIS — H8102 Meniere's disease, left ear: Secondary | ICD-10-CM

## 2018-10-25 DIAGNOSIS — Z952 Presence of prosthetic heart valve: Secondary | ICD-10-CM

## 2018-10-25 NOTE — Assessment & Plan Note (Signed)
He will check with endo about possible clomid relation to high trig.

## 2018-10-25 NOTE — Progress Notes (Signed)
Virtual visit completed through [employer]. Due to national recommendations of social distancing due to [place] 19, a virtual visit is felt to be most appropriate for this patient at this time.   Patient location: home. Wife also on the call with him Provider location: [place] [facility], office If any vitals were documented, they were collected by patient at home unless specified below.    BP 139/85 (BP Location: Right Arm)   Pulse 75   Temp (!) 96.3 F (35.7 C) (Oral)   Ht 5' 5.75" (1.67 m)   Wt 220 lb (99.8 kg)   BMI 35.78 kg/m    CC: 4 mo f/u visit Subjective:    Patient ID: Dylan Fletcher, male    DOB: 07-29-1960, 58 y.o.   MRN: [id number]  HPI: Dylan Fletcher is a 58 y.o. male presenting on 10/25/2018 for Follow-up (4 mo f/u. )   In self-quarantine with wife. Have friends bringing them groceries.   H/o L thalamic stroke 09/2013 after MAZE myomectomy and MVR for HOCM and afib.   Hypertriglyceridemia - he did have ground beef the night before. Also on clomid for the last year for hypogonadism. He will check with endo about this.   Ongoing dizziness due to meniere's - may go see neuro-ENT.   Bilateral hand pain at [facility] joints. Now noticing L shoulder pain, denies inciting trauma/injury.      Relevant past medical, surgical, family and social history reviewed and updated as indicated. Interim medical history since our last visit reviewed. Allergies and medications reviewed and updated. Outpatient Medications Prior to Visit  Medication Sig Dispense Refill  . acetaminophen (TYLENOL) 325 MG tablet Take 650 mg by mouth as needed.    Marland Kitchen acetaminophen-codeine (TYLENOL #3) 300-30 MG tablet Take 1 tablet by mouth 3 (three) times daily as needed for moderate pain (headache). 15 tablet 0  . amoxicillin (AMOXIL) 500 MG tablet Take 500 mg by mouth as directed. 1 BID; start 24 hours prior to dental procedure and 24 hours post dental procedure    . amphetamine-dextroamphetamine  (ADDERALL XR) 30 MG 24 hr capsule Take 1 capsule by mouth daily. In the morning    . amphetamine-dextroamphetamine (ADDERALL) 30 MG tablet Take 30 mg by mouth daily. Takes in the afternoon  0  . cetirizine (ZYRTEC) 10 MG tablet Take 10 mg by mouth daily as needed.     . Cholecalciferol (VITAMIN D) 2000 UNITS CAPS Take 1 capsule by mouth daily.    Derrill Memo ON 10/28/2018] clomiPHENE (SEROPHENE) 50 MG tablet Take 0.5 tablets (25 mg total) by mouth 2 (two) times a week.    . famotidine (PEPCID) 20 MG tablet Take 20 mg by mouth daily.    . furosemide (LASIX) 20 MG tablet Take 1 tablet (20 mg total) by mouth daily. With extra prn weight gain    . Glucosamine Sulfate 500 MG CAPS Take 1 capsule by mouth daily.     . metoprolol succinate (TOPROL-XL) 25 MG 24 hr tablet Take 1 tablet (25 mg total) by mouth daily.    . Multiple Vitamin (MULTIVITAMIN) tablet Take 1 tablet by mouth daily.    Marland Kitchen oxyCODONE-acetaminophen (PERCOCET/ROXICET) 5-325 MG per tablet Take 1 tablet by mouth every 4 (four) hours as needed for severe pain.    . sildenafil (VIAGRA) 100 MG tablet Take 1 tablet (100 mg total) by mouth daily as needed for erectile dysfunction. 10 tablet 3  . warfarin (COUMADIN) 5 MG tablet Take 5  mg by mouth as directed.     . clomiPHENE (SEROPHENE) 50 MG tablet Take by mouth.     No facility-administered medications prior to visit.      Per HPI unless specifically indicated in ROS section below Review of Systems Objective:    BP 139/85 (BP Location: Right Arm)   Pulse 75   Temp (!) 96.3 F (35.7 C) (Oral)   Ht 5' 5.75" (1.67 m)   Wt 220 lb (99.8 kg)   BMI 35.78 kg/m   Wt Readings from Last 3 Encounters:  10/25/18 220 lb (99.8 kg)  06/24/18 217 lb 8 oz (98.7 kg)  03/19/18 213 lb 8 oz (96.8 kg)     Physical exam: Gen: alert, NAD, not ill appearing Pulm: speaks in complete sentences without increased work of breathing Psych: normal mood, normal thought content      Results for orders placed or  performed in visit on 10/22/18  Comprehensive metabolic panel  Result Value Ref Range   Sodium 137 135 - 145 mEq/L   Potassium 4.4 3.5 - 5.1 mEq/L   Chloride 104 96 - 112 mEq/L   CO2 27 19 - 32 mEq/L   Glucose, Bld 98 70 - 99 mg/dL   BUN 17 6 - 23 mg/dL   Creatinine, Ser 1.18 0.40 - 1.50 mg/dL   Total Bilirubin 0.4 0.2 - 1.2 mg/dL   Alkaline Phosphatase 37 (L) 39 - 117 U/L   AST 28 0 - 37 U/L   ALT 26 0 - 53 U/L   Total Protein 6.6 6.0 - 8.3 g/dL   Albumin 4.1 3.5 - 5.2 g/dL   Calcium 8.9 8.4 - 10.5 mg/dL   GFR 63.03 >60.00 mL/min  Lipid panel  Result Value Ref Range   Cholesterol 204 (H) 0 - 200 mg/dL   Triglycerides (H) 0.0 - 149.0 mg/dL    813.0 Triglyceride is over 400; calculations on Lipids are invalid.   HDL 23.90 (L) >39.00 mg/dL   Total CHOL/HDL Ratio 9   LDL cholesterol, direct  Result Value Ref Range   Direct LDL 62.0 mg/dL   Assessment & Plan:  Over 25 minutes were spent face-to-face with the patient during this encounter and >50% of that time was spent on counseling and coordination of care  Problem List Items Addressed This Visit    Warfarin anticoagulation   Meniere disease, left    Appreciate ENT care. May see neuro-otologist      Hypogonadism in male    On clomid by endo.      Hypertrophic obstructive cardiomyopathy (H[facility])   Hypertriglyceridemia - Primary    Marked elevation to 800-900 over last several months, then on repeat levels down to 200-300s. Possible dietary relation, also concern for possible med - induced high triglycerides - he has been on clomid for hypogonadism for the past year. I asked him to touch base with endo about this (Dr Rozann Lesches at [facility])       HLD (hyperlipidemia)    He will check with endo about possible clomid relation to high trig.       History of cerebrovascular accident (CVA) with residual deficit   H/O mitral valve replacement   Cognitive deficit as late effect of cerebrovascular accident (CVA)   Anxiety  associated with depression    Stable period, sees psych.           No orders of the defined types were placed in this encounter.  No orders of the defined types were  placed in this encounter.   Follow up plan: No follow-ups on file.  Ria Bush, MD

## 2018-10-25 NOTE — Assessment & Plan Note (Signed)
On clomid by endo.

## 2018-10-25 NOTE — Assessment & Plan Note (Signed)
Appreciate ENT care. May see neuro-otologist

## 2018-10-25 NOTE — Assessment & Plan Note (Signed)
Stable period, sees psych.

## 2018-10-25 NOTE — Assessment & Plan Note (Addendum)
Marked elevation to 800-900 over last several months, then on repeat levels down to 200-300s. Possible dietary relation, also concern for possible med - induced high triglycerides - he has been on clomid for hypogonadism for the past year. I asked him to touch base with endo about this (Dr Rozann Lesches at [facility])

## 2018-10-31 DIAGNOSIS — Z952 Presence of prosthetic heart valve: Secondary | ICD-10-CM | POA: Diagnosis not present

## 2018-11-14 ENCOUNTER — Encounter: Payer: Self-pay | Admitting: Family Medicine

## 2018-11-14 DIAGNOSIS — Z952 Presence of prosthetic heart valve: Secondary | ICD-10-CM | POA: Diagnosis not present

## 2018-11-18 DIAGNOSIS — F411 Generalized anxiety disorder: Secondary | ICD-10-CM | POA: Diagnosis not present

## 2018-11-18 DIAGNOSIS — I69311 Memory deficit following cerebral infarction: Secondary | ICD-10-CM | POA: Diagnosis not present

## 2018-11-18 DIAGNOSIS — F900 Attention-deficit hyperactivity disorder, predominantly inattentive type: Secondary | ICD-10-CM | POA: Diagnosis not present

## 2018-11-18 DIAGNOSIS — F321 Major depressive disorder, single episode, moderate: Secondary | ICD-10-CM | POA: Diagnosis not present

## 2018-11-18 DIAGNOSIS — Z79899 Other long term (current) drug therapy: Secondary | ICD-10-CM | POA: Diagnosis not present

## 2018-11-25 ENCOUNTER — Encounter: Payer: Self-pay | Admitting: Family Medicine

## 2018-12-13 ENCOUNTER — Encounter: Payer: Self-pay | Admitting: Family Medicine

## 2018-12-24 ENCOUNTER — Encounter: Payer: Self-pay | Admitting: Family Medicine

## 2018-12-25 ENCOUNTER — Encounter: Payer: Self-pay | Admitting: Family Medicine

## 2018-12-25 ENCOUNTER — Ambulatory Visit (INDEPENDENT_AMBULATORY_CARE_PROVIDER_SITE_OTHER): Payer: Medicare Other | Admitting: Family Medicine

## 2018-12-25 VITALS — BP 140/83 | HR 64 | Temp 97.8°F | Ht 65.75 in | Wt 220.0 lb

## 2018-12-25 DIAGNOSIS — Z7901 Long term (current) use of anticoagulants: Secondary | ICD-10-CM

## 2018-12-25 DIAGNOSIS — T63461A Toxic effect of venom of wasps, accidental (unintentional), initial encounter: Secondary | ICD-10-CM | POA: Diagnosis not present

## 2018-12-25 MED ORDER — CEPHALEXIN 500 MG PO CAPS: 500.0000 mg | ORAL_CAPSULE | Freq: Three times a day (TID) | ORAL | 0 refills | Status: DC

## 2018-12-25 MED ORDER — PREDNISONE 20 MG PO TABS: ORAL_TABLET | ORAL | 0 refills | Status: DC

## 2018-12-25 NOTE — Assessment & Plan Note (Signed)
Coumadin check scheduled tomorrow - she will review recently diagnosed med with coumadin clinic for adjustment in dosing as needed.

## 2018-12-25 NOTE — Progress Notes (Signed)
Virtual visit completed through [place]. Due to national recommendations of social distancing due to COVID-19, a virtual visit is felt to be most appropriate for this patient at this time. Reviewed limitations of a virtual visit.   Patient location: home Provider location: [place] at [facility], office If any vitals were documented, they were collected by patient at home unless specified below.    BP 140/83   Pulse 64   Temp 97.8 F (36.6 C)   Ht 5' 5.75" (1.67 m)   Wt 220 lb (99.8 kg)   BMI 35.78 kg/m    CC: yellow jacket sting Subjective:    Patient ID: Dylan Fletcher, male    DOB: 04/16/1961, 58 y.o.   MRN: [id number]  HPI: Eliott Amparan is a 58 y.o. male presenting on 12/25/2018 for Insect Bite (C/o yellow jacket stings on both ankles, left worse. Has some swelling. Happened 12/23/18. )   DOI: 12/23/2018 late afternoon while mowing lawn.  Yellow jacket stings bilateral ankles (R lateral, L medial).   Treating with ice and benadryl with benefit. Also took regular tylenol and tylenol #3. Significant pain persists. Noticing spreading redness on left.   No systemic symptoms of hives, fever/chills, throat swelling, palpitations or dizziness or hypotension.      Relevant past medical, surgical, family and social history reviewed and updated as indicated. Interim medical history since our last visit reviewed. Allergies and medications reviewed and updated. Outpatient Medications Prior to Visit  Medication Sig Dispense Refill  . acetaminophen (TYLENOL) 325 MG tablet Take 650 mg by mouth as needed.    Marland Kitchen acetaminophen-codeine (TYLENOL #3) 300-30 MG tablet Take 1 tablet by mouth 3 (three) times daily as needed for moderate pain (headache). 15 tablet 0  . amoxicillin (AMOXIL) 500 MG tablet Take 500 mg by mouth as directed. 1 BID; start 24 hours prior to dental procedure and 24 hours post dental procedure    . amphetamine-dextroamphetamine (ADDERALL XR) 30 MG 24 hr capsule Take 1  capsule by mouth daily. In the morning    . amphetamine-dextroamphetamine (ADDERALL) 30 MG tablet Take 30 mg by mouth daily. Takes in the afternoon  0  . cetirizine (ZYRTEC) 10 MG tablet Take 10 mg by mouth daily as needed.     . Cholecalciferol (VITAMIN D) 2000 UNITS CAPS Take 1 capsule by mouth daily.    . clomiPHENE (SEROPHENE) 50 MG tablet Take 0.5 tablets (25 mg total) by mouth 2 (two) times a week.    . famotidine (PEPCID) 20 MG tablet Take 20 mg by mouth daily.    . furosemide (LASIX) 20 MG tablet Take 1 tablet (20 mg total) by mouth daily. With extra prn weight gain    . Glucosamine Sulfate 500 MG CAPS Take 1 capsule by mouth daily.     . metoprolol succinate (TOPROL-XL) 25 MG 24 hr tablet Take 1 tablet (25 mg total) by mouth daily.    . Multiple Vitamin (MULTIVITAMIN) tablet Take 1 tablet by mouth daily.    Marland Kitchen oxyCODONE-acetaminophen (PERCOCET/ROXICET) 5-325 MG per tablet Take 1 tablet by mouth every 4 (four) hours as needed for severe pain.    . sildenafil (VIAGRA) 100 MG tablet Take 1 tablet (100 mg total) by mouth daily as needed for erectile dysfunction. 10 tablet 3  . warfarin (COUMADIN) 5 MG tablet Take 5 mg by mouth as directed.      No facility-administered medications prior to visit.      Per HPI unless specifically indicated  in ROS section below Review of Systems Objective:    BP 140/83   Pulse 64   Temp 97.8 F (36.6 C)   Ht 5' 5.75" (1.67 m)   Wt 220 lb (99.8 kg)   BMI 35.78 kg/m   Wt Readings from Last 3 Encounters:  12/25/18 220 lb (99.8 kg)  10/25/18 220 lb (99.8 kg)  06/24/18 217 lb 8 oz (98.7 kg)     Physical exam: Gen: alert, NAD, not ill appearing Pulm: speaks in complete sentences without increased work of breathing Psych: normal mood, normal thought content  Skin: L inner ankle with dark erythema surrounding insect sting, lighter erythema spreading past delineated area from yesterday. R lateral ankle with dark erythema surrounding insect bite,  without spread.      Results for orders placed or performed in visit on 10/22/18  Comprehensive metabolic panel  Result Value Ref Range   Sodium 137 135 - 145 mEq/L   Potassium 4.4 3.5 - 5.1 mEq/L   Chloride 104 96 - 112 mEq/L   CO2 27 19 - 32 mEq/L   Glucose, Bld 98 70 - 99 mg/dL   BUN 17 6 - 23 mg/dL   Creatinine, Ser 1.18 0.40 - 1.50 mg/dL   Total Bilirubin 0.4 0.2 - 1.2 mg/dL   Alkaline Phosphatase 37 (L) 39 - 117 U/L   AST 28 0 - 37 U/L   ALT 26 0 - 53 U/L   Total Protein 6.6 6.0 - 8.3 g/dL   Albumin 4.1 3.5 - 5.2 g/dL   Calcium 8.9 8.4 - 10.5 mg/dL   GFR 63.03 >60.00 mL/min  Lipid panel  Result Value Ref Range   Cholesterol 204 (H) 0 - 200 mg/dL   Triglycerides (H) 0.0 - 149.0 mg/dL    813.0 Triglyceride is over 400; calculations on Lipids are invalid.   HDL 23.90 (L) >39.00 mg/dL   Total CHOL/HDL Ratio 9   LDL cholesterol, direct  Result Value Ref Range   Direct LDL 62.0 mg/dL   Assessment & Plan:   Problem List Items Addressed This Visit    Yellow jacket sting - Primary    Bilateral ankles L>R with concern for spreading erythema ?cellulitis. Will treat for cellulitis with keflex 500mg  TID x 7 days. Will Rx short prednisone course for possible large local reaction after sting. Update if not improving with treatment.       Warfarin anticoagulation    Coumadin check scheduled tomorrow - she will review recently diagnosed med with coumadin clinic for adjustment in dosing as needed.           Meds ordered this encounter  Medications  . DISCONTD: cephALEXin (KEFLEX) 500 MG capsule    Sig: Take 1 capsule (500 mg total) by mouth 3 (three) times daily.    Dispense:  15 capsule    Refill:  0  . predniSONE (DELTASONE) 20 MG tablet    Sig: Take two tablets daily for 2 days followed by one tablet daily for 2 days    Dispense:  6 tablet    Refill:  0  . cephALEXin (KEFLEX) 500 MG capsule    Sig: Take 1 capsule (500 mg total) by mouth 3 (three) times daily.     Dispense:  21 capsule    Refill:  0    Use this duration   No orders of the defined types were placed in this encounter.   I discussed the assessment and treatment plan with the patient. The patient  was provided an opportunity to ask questions and all were answered. The patient agreed with the plan and demonstrated an understanding of the instructions. The patient was advised to call back or seek an in-person evaluation if the symptoms worsen or if the condition fails to improve as anticipated.  Follow up plan: Return if symptoms worsen or fail to improve.  Ria Bush, MD

## 2018-12-25 NOTE — Assessment & Plan Note (Addendum)
Bilateral ankles L>R with concern for spreading erythema ?cellulitis. Will treat for cellulitis with keflex 500mg  TID x 7 days. Will Rx short prednisone course for possible large local reaction after sting. Update if not improving with treatment.

## 2018-12-26 DIAGNOSIS — Z952 Presence of prosthetic heart valve: Secondary | ICD-10-CM | POA: Diagnosis not present

## 2018-12-26 NOTE — Telephone Encounter (Signed)
Seen for virtual visit.

## 2018-12-31 ENCOUNTER — Encounter: Payer: Self-pay | Admitting: Family Medicine

## 2019-01-30 DIAGNOSIS — Z952 Presence of prosthetic heart valve: Secondary | ICD-10-CM | POA: Diagnosis not present

## 2019-02-03 ENCOUNTER — Encounter: Payer: Self-pay | Admitting: Family Medicine

## 2019-02-06 ENCOUNTER — Encounter: Payer: Self-pay | Admitting: Family Medicine

## 2019-02-06 DIAGNOSIS — Z952 Presence of prosthetic heart valve: Secondary | ICD-10-CM | POA: Diagnosis not present

## 2019-02-06 DIAGNOSIS — R5383 Other fatigue: Secondary | ICD-10-CM | POA: Diagnosis not present

## 2019-02-06 DIAGNOSIS — I421 Obstructive hypertrophic cardiomyopathy: Secondary | ICD-10-CM | POA: Diagnosis not present

## 2019-02-06 DIAGNOSIS — I480 Paroxysmal atrial fibrillation: Secondary | ICD-10-CM | POA: Diagnosis not present

## 2019-02-06 DIAGNOSIS — I471 Supraventricular tachycardia: Secondary | ICD-10-CM | POA: Diagnosis not present

## 2019-02-13 DIAGNOSIS — F321 Major depressive disorder, single episode, moderate: Secondary | ICD-10-CM | POA: Diagnosis not present

## 2019-02-13 DIAGNOSIS — Z79899 Other long term (current) drug therapy: Secondary | ICD-10-CM | POA: Diagnosis not present

## 2019-02-13 DIAGNOSIS — F411 Generalized anxiety disorder: Secondary | ICD-10-CM | POA: Diagnosis not present

## 2019-02-13 DIAGNOSIS — F900 Attention-deficit hyperactivity disorder, predominantly inattentive type: Secondary | ICD-10-CM | POA: Diagnosis not present

## 2019-03-06 ENCOUNTER — Encounter: Payer: Self-pay | Admitting: Family Medicine

## 2019-03-06 ENCOUNTER — Ambulatory Visit (INDEPENDENT_AMBULATORY_CARE_PROVIDER_SITE_OTHER): Payer: Medicare Other | Admitting: Family Medicine

## 2019-03-06 VITALS — BP 127/76 | HR 71 | Temp 97.9°F | Ht 65.75 in | Wt 216.0 lb

## 2019-03-06 DIAGNOSIS — R202 Paresthesia of skin: Secondary | ICD-10-CM

## 2019-03-06 DIAGNOSIS — I69319 Unspecified symptoms and signs involving cognitive functions following cerebral infarction: Secondary | ICD-10-CM | POA: Diagnosis not present

## 2019-03-06 DIAGNOSIS — M79674 Pain in right toe(s): Secondary | ICD-10-CM | POA: Diagnosis not present

## 2019-03-06 DIAGNOSIS — H8102 Meniere's disease, left ear: Secondary | ICD-10-CM | POA: Diagnosis not present

## 2019-03-06 DIAGNOSIS — I6930 Unspecified sequelae of cerebral infarction: Secondary | ICD-10-CM

## 2019-03-06 DIAGNOSIS — R42 Dizziness and giddiness: Secondary | ICD-10-CM | POA: Diagnosis not present

## 2019-03-06 NOTE — Progress Notes (Signed)
Virtual visit completed through [place]. Due to national recommendations of social distancing due to COVID-19, a virtual visit is felt to be most appropriate for this patient at this time. Reviewed limitations of a virtual visit.   Patient location: home Provider location: [place] at [facility], office If any vitals were documented, they were collected by patient at home unless specified below.    BP 127/76    Pulse 71    Temp 97.9 F (36.6 C)    Ht 5' 5.75" (1.67 m)    Wt 216 lb (98 kg)    BMI 35.13 kg/m    CC: multiple concerns Subjective:    Patient ID: Dylan Fletcher, male    DOB: 1960/11/27, 58 y.o.   MRN: [id number]  HPI: Dylan Fletcher is a 58 y.o. male presenting on 03/06/2019 for Numbness (C/o numbness in posterior neck.  Feels like a buzzing.  Sensation comes and goes 2-3 times daily.  Started about 1 mo ago. ), Dizziness (C/o dizziness and feeling of room spinning when lying down.  Started 3-4 wks ago. ), and Toe Pain (C/o rigtht great toe pain.  Started about 3 wks ago. Has improved.  )   1 mo h/o paresthesia at base of neck, area of numbness is about 2-3 inches in diameter. No neck pain or shooting pain down arms, numbness/weakness down arms. No other paresthesias. Noticing some stiffness to L shoulder - more pain with ROM reaching overhead as well as with arm behind back.   Lab Results  Component Value Date   [id number] 06/02/2015   Also notices marked dizziness when he lays down in bed - described as room spinning vertigo that lasts for 20-30 seconds then resolves, episodes are worse than his positional vertigo episodes in the past. Symptoms very positional. No vision changes or double vision. Known h/o L meniere's disease, has seen ENT. Feels current dizzy episodes are much more intense than meniere's episodes.   Several weeks ago had episode of R great toe pain - ?gout. Tylenol didn't help. Slowly went away on its own. No redness/swelling or warmth. Denies  inciting trauma/injury or falls. No h/o gout. He occasionally takes tart cherry.   On coumadin, INR 3.1 (goal 2.5-3.5) 02/27/2019.   Has started using checklist to help remind him to do necessary tasks throughout the day. Finds this helpful.      Relevant past medical, surgical, family and social history reviewed and updated as indicated. Interim medical history since our last visit reviewed. Allergies and medications reviewed and updated. Outpatient Medications Prior to Visit  Medication Sig Dispense Refill   acetaminophen-codeine (TYLENOL #3) 300-30 MG tablet Take 1 tablet by mouth 3 (three) times daily as needed for moderate pain (headache). 15 tablet 0   amoxicillin (AMOXIL) 500 MG tablet Take 500 mg by mouth as directed. 1 BID; start 24 hours prior to dental procedure and 24 hours post dental procedure     amphetamine-dextroamphetamine (ADDERALL XR) 30 MG 24 hr capsule Take 1 capsule by mouth daily. In the morning     amphetamine-dextroamphetamine (ADDERALL) 30 MG tablet Take 30 mg by mouth daily. Takes in the afternoon  0   buPROPion (WELLBUTRIN XL) 300 MG 24 hr tablet Take 300 mg by mouth daily.     cetirizine (ZYRTEC) 10 MG tablet Take 10 mg by mouth daily as needed.      Cholecalciferol (VITAMIN D) 2000 UNITS CAPS Take 1 capsule by mouth daily.     clomiPHENE (  SEROPHENE) 50 MG tablet Take 0.5 tablets (25 mg total) by mouth 2 (two) times a week.     famotidine (PEPCID) 20 MG tablet Take 20 mg by mouth daily.     furosemide (LASIX) 20 MG tablet Take 1 tablet (20 mg total) by mouth daily. With extra prn weight gain     Glucosamine Sulfate 500 MG CAPS Take 1 capsule by mouth daily.      metoprolol succinate (TOPROL-XL) 25 MG 24 hr tablet Take 1 tablet (25 mg total) by mouth daily.     Multiple Vitamin (MULTIVITAMIN) tablet Take 1 tablet by mouth daily.     oxyCODONE-acetaminophen (PERCOCET/ROXICET) 5-325 MG per tablet Take 1 tablet by mouth every 4 (four) hours as needed for  severe pain.     sildenafil (VIAGRA) 100 MG tablet Take 1 tablet (100 mg total) by mouth daily as needed for erectile dysfunction. 10 tablet 3   warfarin (COUMADIN) 5 MG tablet Take 5 mg by mouth as directed.      acetaminophen (TYLENOL) 325 MG tablet Take 650 mg by mouth as needed.     cephALEXin (KEFLEX) 500 MG capsule Take 1 capsule (500 mg total) by mouth 3 (three) times daily. 21 capsule 0   predniSONE (DELTASONE) 20 MG tablet Take two tablets daily for 2 days followed by one tablet daily for 2 days 6 tablet 0   No facility-administered medications prior to visit.      Per HPI unless specifically indicated in ROS section below Review of Systems Objective:    BP 127/76    Pulse 71    Temp 97.9 F (36.6 C)    Ht 5' 5.75" (1.67 m)    Wt 216 lb (98 kg)    BMI 35.13 kg/m   Wt Readings from Last 3 Encounters:  03/06/19 216 lb (98 kg)  12/25/18 220 lb (99.8 kg)  10/25/18 220 lb (99.8 kg)     Physical exam: Gen: alert, NAD, not ill appearing Pulm: speaks in complete sentences without increased work of breathing Psych: normal mood, normal thought content      Results for orders placed or performed in visit on 10/22/18  Comprehensive metabolic panel  Result Value Ref Range   Sodium 137 135 - 145 mEq/L   Potassium 4.4 3.5 - 5.1 mEq/L   Chloride 104 96 - 112 mEq/L   CO2 27 19 - 32 mEq/L   Glucose, Bld 98 70 - 99 mg/dL   BUN 17 6 - 23 mg/dL   Creatinine, Ser 1.18 0.40 - 1.50 mg/dL   Total Bilirubin 0.4 0.2 - 1.2 mg/dL   Alkaline Phosphatase 37 (L) 39 - 117 U/L   AST 28 0 - 37 U/L   ALT 26 0 - 53 U/L   Total Protein 6.6 6.0 - 8.3 g/dL   Albumin 4.1 3.5 - 5.2 g/dL   Calcium 8.9 8.4 - 10.5 mg/dL   GFR 63.03 >60.00 mL/min  Lipid panel  Result Value Ref Range   Cholesterol 204 (H) 0 - 200 mg/dL   Triglycerides (H) 0.0 - 149.0 mg/dL    813.0 Triglyceride is over 400; calculations on Lipids are invalid.   HDL 23.90 (L) >39.00 mg/dL   Total CHOL/HDL Ratio 9   LDL  cholesterol, direct  Result Value Ref Range   Direct LDL 62.0 mg/dL   Assessment & Plan:   Problem List Items Addressed This Visit    Vertigo - Primary    Describes fleeting episodes of severe  positional vertigo. Doubt central in nature although he does have stroke history. He does have h/o BPV as well and has epley maneuvers to perform at home - advised start this. In h/o meniere, consider return to ENT for further evaluation.       Paresthesia    Describes paresthesias at base of neck without associated neck pain or radiculopathy. ?vit B12 def. Suggested trial vit b12 500-1068mcg daily for 1 month and monitor effect.       Meniere disease, left    If ongoing dizziness, recommended return to ENT or see neuro-otologist.      History of cerebrovascular accident (CVA) with residual deficit   Great toe pain, right    Not consistent with gout. Regardless will monitor given improved. Consider checking urate next labs.       Cognitive deficit as late effect of cerebrovascular accident (CVA)       No orders of the defined types were placed in this encounter.  No orders of the defined types were placed in this encounter.   I discussed the assessment and treatment plan with the patient. The patient was provided an opportunity to ask questions and all were answered. The patient agreed with the plan and demonstrated an understanding of the instructions. The patient was advised to call back or seek an in-person evaluation if the symptoms worsen or if the condition fails to improve as anticipated.  Patient Instructions  For vertigo - likely BPPV. Consider seeing neuro otologist or return to ENT. Restart epley maneuvers.  For paresthesias - trial b12 500-1079mcg daily, let me know if worsening.    Follow up plan: No follow-ups on file.  Ria Bush, MD

## 2019-03-06 NOTE — Patient Instructions (Signed)
For vertigo - likely BPPV. Consider seeing neuro otologist or return to ENT. Restart epley maneuvers.  For paresthesias - trial b12 500-1078mcg daily, let me know if worsening.

## 2019-03-07 DIAGNOSIS — R202 Paresthesia of skin: Secondary | ICD-10-CM | POA: Insufficient documentation

## 2019-03-07 DIAGNOSIS — R42 Dizziness and giddiness: Secondary | ICD-10-CM | POA: Insufficient documentation

## 2019-03-07 DIAGNOSIS — M79674 Pain in right toe(s): Secondary | ICD-10-CM | POA: Insufficient documentation

## 2019-03-07 NOTE — Assessment & Plan Note (Signed)
Describes fleeting episodes of severe positional vertigo. Doubt central in nature although he does have stroke history. He does have h/o BPV as well and has epley maneuvers to perform at home - advised start this. In h/o meniere, consider return to ENT for further evaluation.

## 2019-03-07 NOTE — Assessment & Plan Note (Signed)
If ongoing dizziness, recommended return to ENT or see neuro-otologist.

## 2019-03-07 NOTE — Assessment & Plan Note (Signed)
Not consistent with gout. Regardless will monitor given improved. Consider checking urate next labs.

## 2019-03-07 NOTE — Assessment & Plan Note (Signed)
Describes paresthesias at base of neck without associated neck pain or radiculopathy. ?vit B12 def. Suggested trial vit b12 500-1041mcg daily for 1 month and monitor effect.

## 2019-03-12 DIAGNOSIS — I69311 Memory deficit following cerebral infarction: Secondary | ICD-10-CM | POA: Diagnosis not present

## 2019-03-12 DIAGNOSIS — F321 Major depressive disorder, single episode, moderate: Secondary | ICD-10-CM | POA: Diagnosis not present

## 2019-03-12 DIAGNOSIS — Z79899 Other long term (current) drug therapy: Secondary | ICD-10-CM | POA: Diagnosis not present

## 2019-03-12 DIAGNOSIS — F411 Generalized anxiety disorder: Secondary | ICD-10-CM | POA: Diagnosis not present

## 2019-03-12 DIAGNOSIS — F900 Attention-deficit hyperactivity disorder, predominantly inattentive type: Secondary | ICD-10-CM | POA: Diagnosis not present

## 2019-03-23 ENCOUNTER — Encounter: Payer: Self-pay | Admitting: Family Medicine

## 2019-03-27 DIAGNOSIS — Z952 Presence of prosthetic heart valve: Secondary | ICD-10-CM | POA: Diagnosis not present

## 2019-03-31 DIAGNOSIS — E291 Testicular hypofunction: Secondary | ICD-10-CM | POA: Diagnosis not present

## 2019-04-09 DIAGNOSIS — Z79899 Other long term (current) drug therapy: Secondary | ICD-10-CM | POA: Diagnosis not present

## 2019-04-09 DIAGNOSIS — F411 Generalized anxiety disorder: Secondary | ICD-10-CM | POA: Diagnosis not present

## 2019-04-09 DIAGNOSIS — F321 Major depressive disorder, single episode, moderate: Secondary | ICD-10-CM | POA: Diagnosis not present

## 2019-04-09 DIAGNOSIS — F900 Attention-deficit hyperactivity disorder, predominantly inattentive type: Secondary | ICD-10-CM | POA: Diagnosis not present

## 2019-04-09 DIAGNOSIS — I69311 Memory deficit following cerebral infarction: Secondary | ICD-10-CM | POA: Diagnosis not present

## 2019-04-14 DIAGNOSIS — Z23 Encounter for immunization: Secondary | ICD-10-CM | POA: Diagnosis not present

## 2019-04-17 DIAGNOSIS — Z7901 Long term (current) use of anticoagulants: Secondary | ICD-10-CM | POA: Diagnosis not present

## 2019-04-17 DIAGNOSIS — I440 Atrioventricular block, first degree: Secondary | ICD-10-CM | POA: Diagnosis not present

## 2019-04-17 DIAGNOSIS — Z952 Presence of prosthetic heart valve: Secondary | ICD-10-CM | POA: Diagnosis not present

## 2019-04-17 DIAGNOSIS — I480 Paroxysmal atrial fibrillation: Secondary | ICD-10-CM | POA: Diagnosis not present

## 2019-04-17 DIAGNOSIS — I447 Left bundle-branch block, unspecified: Secondary | ICD-10-CM | POA: Diagnosis not present

## 2019-04-17 DIAGNOSIS — Z5181 Encounter for therapeutic drug level monitoring: Secondary | ICD-10-CM | POA: Diagnosis not present

## 2019-04-17 DIAGNOSIS — I421 Obstructive hypertrophic cardiomyopathy: Secondary | ICD-10-CM | POA: Diagnosis not present

## 2019-04-17 DIAGNOSIS — Z8679 Personal history of other diseases of the circulatory system: Secondary | ICD-10-CM | POA: Diagnosis not present

## 2019-04-25 DIAGNOSIS — Z952 Presence of prosthetic heart valve: Secondary | ICD-10-CM | POA: Diagnosis not present

## 2019-04-26 ENCOUNTER — Encounter: Payer: Self-pay | Admitting: Family Medicine

## 2019-04-29 NOTE — Telephone Encounter (Signed)
C/x 04/30/19 OV due to death in the family.  Pt/wife will call to r/s.

## 2019-04-30 ENCOUNTER — Telehealth: Payer: Medicare Other | Admitting: Family Medicine

## 2019-05-01 DIAGNOSIS — E291 Testicular hypofunction: Secondary | ICD-10-CM | POA: Diagnosis not present

## 2019-05-09 ENCOUNTER — Telehealth: Payer: Medicare Other | Admitting: Family Medicine

## 2019-05-14 ENCOUNTER — Encounter: Payer: Self-pay | Admitting: Family Medicine

## 2019-05-14 ENCOUNTER — Telehealth (INDEPENDENT_AMBULATORY_CARE_PROVIDER_SITE_OTHER): Payer: Medicare Other | Admitting: Family Medicine

## 2019-05-14 VITALS — BP 119/81 | HR 81 | Temp 97.1°F | Ht 65.75 in | Wt 216.0 lb

## 2019-05-14 DIAGNOSIS — F331 Major depressive disorder, recurrent, moderate: Secondary | ICD-10-CM | POA: Diagnosis not present

## 2019-05-14 DIAGNOSIS — Z952 Presence of prosthetic heart valve: Secondary | ICD-10-CM

## 2019-05-14 DIAGNOSIS — I421 Obstructive hypertrophic cardiomyopathy: Secondary | ICD-10-CM | POA: Diagnosis not present

## 2019-05-14 DIAGNOSIS — E781 Pure hyperglyceridemia: Secondary | ICD-10-CM | POA: Diagnosis not present

## 2019-05-14 DIAGNOSIS — R202 Paresthesia of skin: Secondary | ICD-10-CM | POA: Diagnosis not present

## 2019-05-14 DIAGNOSIS — I69319 Unspecified symptoms and signs involving cognitive functions following cerebral infarction: Secondary | ICD-10-CM

## 2019-05-14 DIAGNOSIS — E291 Testicular hypofunction: Secondary | ICD-10-CM | POA: Diagnosis not present

## 2019-05-14 MED ORDER — VITAMIN B-12 1000 MCG PO TABS: 1000.0000 ug | ORAL_TABLET | Freq: Every day | ORAL | Status: DC

## 2019-05-14 MED ORDER — ACETAMINOPHEN-CODEINE #3 300-30 MG PO TABS: 1.0000 | ORAL_TABLET | Freq: Three times a day (TID) | ORAL | 0 refills | Status: DC | PRN

## 2019-05-14 MED ORDER — ACETAMINOPHEN 500 MG PO TABS: 500.0000 mg | ORAL_TABLET | Freq: Two times a day (BID) | ORAL | Status: AC

## 2019-05-14 NOTE — Assessment & Plan Note (Signed)
Stable period.  

## 2019-05-14 NOTE — Assessment & Plan Note (Signed)
Stable on lower clomid dose with improvement in triglycerides. Endo plan is to fully come off.

## 2019-05-14 NOTE — Assessment & Plan Note (Signed)
Managed by psych, now on cytomel. Continue this and wellbutrin XL.

## 2019-05-14 NOTE — Assessment & Plan Note (Signed)
Ongoing, stable period. Doing better with weekly checklist. Continues adderall.

## 2019-05-14 NOTE — Progress Notes (Signed)
Virtual visit completed through [place]. Due to national recommendations of social distancing due to COVID-19, a virtual visit is felt to be most appropriate for this patient at this time. Reviewed limitations of a virtual visit.   Patient location: home Provider location: [place] at [facility], office If any vitals were documented, they were collected by patient at home unless specified below.    BP 119/81   Pulse 81   Temp (!) 97.1 F (36.2 C)   Ht 5' 5.75" (1.67 m)   Wt 216 lb (98 kg)   BMI 35.13 kg/m    CC: 6 mo f/u visit Subjective:    Patient ID: Dylan Fletcher, male    DOB: 06-22-1960, 58 y.o.   MRN: [id number]  HPI: Dylan Fletcher is a 58 y.o. male presenting on 05/14/2019 for Follow-up (6 mo f/u.)   Recent death in family - MIL, traveled to [place].   H/o L thalamic stroke 09/2013 after MAZE myomectomy and MVR for HOCM and afib.  Saw EP Dr Manuella Ghazi 03/2019, stable report. Planned f/u Q yearly.  [facility]ues [facility]in [facility] (INR goal 2.5-3.5).   Ongoing memory deficits - weekly checklist helps with this.  Hypertriglyceridemia - found due to clomid so this was decreased. Testosterone remains stable. He has been started on cytomel 10mg  daily with benefit in energy levels (through psychiatrist Dr Beatrice L[facility] at [facility]).   Ongoing intermittent paresthesia of a patch at base of midline neck. No burning pain or soreness of neck. No shooting pain down arms, numbness/weakness of arms. FROM at neck.  Ongoing vertigo managing with epley's.       Relevant past medical, surgical, family and social history reviewed and updated as indicated. Interim medical history since our last visit reviewed. Allergies and medications reviewed and updated. Outpatient Medications Prior to Visit  Medication Sig Dispense Refill  . amoxicillin (AMOXIL) 500 MG tablet Take 500 mg by mouth as directed. 1 BID; start 24 hours prior to dental procedure and 24 hours post  dental procedure    . amphetamine-dextroamphetamine (ADDERALL XR) 30 MG 24 hr capsule Take 1 capsule by mouth daily. In the morning    . amphetamine-dextroamphetamine (ADDERALL) 30 MG tablet Take 30 mg by mouth daily. Takes in the afternoon  0  . buPROPion (WELLBUTRIN XL) 150 MG 24 hr tablet Take 1 tablet by mouth daily.    . cetirizine (ZYRTEC) 10 MG tablet Take 10 mg by mouth daily as needed.     . Cholecalciferol (VITAMIN D) 2000 UNITS CAPS Take 1 capsule by mouth daily.    . clomiPHENE (SEROPHENE) 50 MG tablet Take 25 mg by mouth once a week.     . famotidine (PEPCID) 20 MG tablet Take 20 mg by mouth daily.    . furosemide (LASIX) 20 MG tablet Take 1 tablet (20 mg total) by mouth daily. With extra prn weight gain    . Glucosamine Sulfate 500 MG CAPS Take 1 capsule by mouth daily.     Marland Kitchen liothyronine (CYTOMEL) 5 MCG tablet Take 2 tablets by mouth daily.    . metoprolol succinate (TOPROL-XL) 25 MG 24 hr tablet Take 25 mg by mouth daily. Takes 2 tablets daily    . Multiple Vitamin (MULTIVITAMIN) tablet Take 1 tablet by mouth daily.    Marland Kitchen oxyCODONE-acetaminophen (PERCOCET/ROXICET) 5-325 MG per tablet Take 1 tablet by mouth every 4 (four) hours as needed for severe pain.    . sildenafil (VIAGRA) 100 MG tablet Take  1 tablet (100 mg total) by mouth daily as needed for erectile dysfunction. 10 tablet 3  . warfarin (COUMADIN) 5 MG tablet Take 5 mg by mouth as directed.     Marland Kitchen acetaminophen-codeine (TYLENOL #3) 300-30 MG tablet Take 1 tablet by mouth 3 (three) times daily as needed for moderate pain (headache). 15 tablet 0  . buPROPion (WELLBUTRIN XL) 300 MG 24 hr tablet Take 300 mg by mouth daily.     No facility-administered medications prior to visit.      Per HPI unless specifically indicated in ROS section below Review of Systems Objective:    BP 119/81   Pulse 81   Temp (!) 97.1 F (36.2 C)   Ht 5' 5.75" (1.67 m)   Wt 216 lb (98 kg)   BMI 35.13 kg/m   Wt Readings from Last 3  Encounters:  05/14/19 216 lb (98 kg)  03/06/19 216 lb (98 kg)  12/25/18 220 lb (99.8 kg)     Physical exam: Gen: alert, NAD, not ill appearing Pulm: speaks in complete sentences without increased work of breathing Psych: normal mood, normal thought content      Results for orders placed or performed in visit on 10/22/18  Comprehensive metabolic panel  Result Value Ref Range   Sodium 137 135 - 145 mEq/L   Potassium 4.4 3.5 - 5.1 mEq/L   Chloride 104 96 - 112 mEq/L   CO2 27 19 - 32 mEq/L   Glucose, Bld 98 70 - 99 mg/dL   BUN 17 6 - 23 mg/dL   Creatinine, Ser 1.18 0.40 - 1.50 mg/dL   Total Bilirubin 0.4 0.2 - 1.2 mg/dL   Alkaline Phosphatase 37 (L) 39 - 117 U/L   AST 28 0 - 37 U/L   ALT 26 0 - 53 U/L   Total Protein 6.6 6.0 - 8.3 g/dL   Albumin 4.1 3.5 - 5.2 g/dL   Calcium 8.9 8.4 - 10.5 mg/dL   GFR 63.03 >60.00 mL/min  Lipid panel  Result Value Ref Range   Cholesterol 204 (H) 0 - 200 mg/dL   Triglycerides (H) 0.0 - 149.0 mg/dL    813.0 Triglyceride is over 400; calculations on Lipids are invalid.   HDL 23.90 (L) >39.00 mg/dL   Total CHOL/HDL Ratio 9   LDL cholesterol, direct  Result Value Ref Range   Direct LDL 62.0 mg/dL   Assessment & Plan:  I did recommend f/u in 4-6 months for AMW.  Problem List Items Addressed This Visit    Paresthesia    Ongoing paresthesia of base of neck - no improvement noted with b12 supplementation - ok to stop b12 vitamin when runs out. Discussed possible cervical etiology - but denies significant other symptoms. Will monitor for now, consider workup if worsening symptoms or new myelopathy/radiculopathy symptoms.       MDD (major depressive disorder), recurrent episode, moderate ([place])    Managed by psych, now on cytomel. Continue this and wellbutrin XL.      Relevant Medications   buPROPion (WELLBUTRIN XL) 150 MG 24 hr tablet   Hypogonadism in male    Stable on lower clomid dose with improvement in triglycerides. Endo plan is to fully  come off.       Hypertrophic obstructive cardiomyopathy (HCC)   Hypertriglyceridemia    Improving on lower clomid dose.       H/O mitral valve replacement    Stable period.      Cognitive deficit as late effect of  cerebrovascular accident (CVA) - Primary    Ongoing, stable period. Doing better with weekly checklist. Continues adderall.           Meds ordered this encounter  Medications  . acetaminophen-codeine (TYLENOL #3) 300-30 MG tablet    Sig: Take 1 tablet by mouth 3 (three) times daily as needed for moderate pain (headache).    Dispense:  15 tablet    Refill:  0  . vitamin B-12 (CYANOCOBALAMIN) 1000 MCG tablet    Sig: Take 1 tablet (1,000 mcg total) by mouth daily.  Marland Kitchen acetaminophen (TYLENOL) 500 MG tablet    Sig: Take 1 tablet (500 mg total) by mouth 2 (two) times daily.   No orders of the defined types were placed in this encounter.   I discussed the assessment and treatment plan with the patient. The patient was provided an opportunity to ask questions and all were answered. The patient agreed with the plan and demonstrated an understanding of the instructions. The patient was advised to call back or seek an in-person evaluation if the symptoms worsen or if the condition fails to improve as anticipated.  Follow up plan: Return in about 6 months (around 11/11/2019) for medicare wellness visit.  Ria Bush, MD

## 2019-05-14 NOTE — Assessment & Plan Note (Signed)
Ongoing paresthesia of base of neck - no improvement noted with b12 supplementation - ok to stop b12 vitamin when runs out. Discussed possible cervical etiology - but denies significant other symptoms. Will monitor for now, consider workup if worsening symptoms or new myelopathy/radiculopathy symptoms.

## 2019-05-14 NOTE — Assessment & Plan Note (Signed)
Improving on lower clomid dose.

## 2019-06-05 ENCOUNTER — Encounter: Payer: Self-pay | Admitting: Family Medicine

## 2019-06-05 NOTE — Telephone Encounter (Addendum)
Spoke with pt asking about sxs.  States he started low grade fever and occasional cough about 05/24/19.  Also, c/o fatigue.  Scheduled pt for MyChart visit tomorrow at 2:30.  Fyi to Dr. Darnell Level.

## 2019-06-06 ENCOUNTER — Other Ambulatory Visit: Payer: Self-pay

## 2019-06-06 ENCOUNTER — Telehealth (INDEPENDENT_AMBULATORY_CARE_PROVIDER_SITE_OTHER): Payer: Medicare Other | Admitting: Family Medicine

## 2019-06-06 ENCOUNTER — Encounter: Payer: Self-pay | Admitting: Family Medicine

## 2019-06-06 DIAGNOSIS — J029 Acute pharyngitis, unspecified: Secondary | ICD-10-CM

## 2019-06-06 NOTE — Assessment & Plan Note (Signed)
Anticipate mild case of viral pharyngitis. Does not exhibit significant symptoms for Covid-19 infection, and symptoms largely improving at this time. I think ok to hold off on covid testing for now, further supportive care reviewed. Update if new or worsening symptoms, low threshold to send for testing. Pt and wife agree with plan.

## 2019-06-06 NOTE — Progress Notes (Signed)
Virtual visit completed through [facility]. Due to national recommendations of social distancing due to COVID-19, a virtual visit is felt to be most appropriate for this patient at this time. Reviewed limitations of a virtual visit.   Patient location: home Provider location: [place] [facility], office If any vitals were documented, they were collected by patient at home unless specified below.    BP (!) 146/79   Pulse 76   Temp 97.9 F (36.6 C)   Ht 5' 5.75" (1.67 m)   Wt 217 lb 2 oz (98.5 kg)   SpO2 98%   BMI 35.31 kg/m    CC: fever Subjective:    Patient ID: Dylan Fletcher, male    DOB: April 08, 1961, 58 y.o.   MRN: [id number]  HPI: Darrold Tripplett is a 58 y.o. male presenting on 06/06/2019 for Fever (C/o low grade fever-max 100.2, cough and fatigue.  Sxs started 05/23/19.  Denies SOB, body aches or diarrhea. )   Date of onset of symptoms: 05/23/2019 Low grade fever to 100 and ST x 2 wks. More fatigued recently, that has improved. Symptoms largely improved, no fever or ST in last 2 days.  No cough, dyspnea, nasal congestion, rhinorrhea, PNdrainage, headache, body aches, loss of taste/smell, abd pain, diarrhea or nausea.   Treating ST and fever with rare tylenol and cough drops.  No known covid exposure. They have been self quarantining at home due to chronic medical conditions.   Saw psychiatrist 2d ago who recommended they touch base with me       Relevant past medical, surgical, family and social history reviewed and updated as indicated. Interim medical history since our last visit reviewed. Allergies and medications reviewed and updated. Outpatient Medications Prior to Visit  Medication Sig Dispense Refill  . acetaminophen (TYLENOL) 500 MG tablet Take 1 tablet (500 mg total) by mouth 2 (two) times daily.    Marland Kitchen acetaminophen-codeine (TYLENOL #3) 300-30 MG tablet Take 1 tablet by mouth 3 (three) times daily as needed for moderate pain (headache). 15 tablet 0  .  amoxicillin (AMOXIL) 500 MG tablet Take 500 mg by mouth as directed. 1 BID; start 24 hours prior to dental procedure and 24 hours post dental procedure    . amphetamine-dextroamphetamine (ADDERALL XR) 30 MG 24 hr capsule Take 1 capsule by mouth daily. In the morning    . amphetamine-dextroamphetamine (ADDERALL) 30 MG tablet Take 30 mg by mouth daily. Takes in the afternoon  0  . buPROPion (WELLBUTRIN XL) 150 MG 24 hr tablet Take 1 tablet by mouth daily.    . cetirizine (ZYRTEC) 10 MG tablet Take 10 mg by mouth daily as needed.     . Cholecalciferol (VITAMIN D) 2000 UNITS CAPS Take 1 capsule by mouth daily.    . clomiPHENE (SEROPHENE) 50 MG tablet Take 25 mg by mouth once a week.     . famotidine (PEPCID) 20 MG tablet Take 20 mg by mouth daily.    . furosemide (LASIX) 20 MG tablet Take 1 tablet (20 mg total) by mouth daily. With extra prn weight gain    . Glucosamine Sulfate 500 MG CAPS Take 1 capsule by mouth daily.     Marland Kitchen liothyronine (CYTOMEL) 5 MCG tablet Take 2 tablets by mouth daily.    . metoprolol succinate (TOPROL-XL) 25 MG 24 hr tablet Take 25 mg by mouth daily. Takes 2 tablets daily    . Multiple Vitamin (MULTIVITAMIN) tablet Take 1 tablet by mouth daily.    Marland Kitchen  oxyCODONE-acetaminophen (PERCOCET/ROXICET) 5-325 MG per tablet Take 1 tablet by mouth every 4 (four) hours as needed for severe pain.    . sildenafil (VIAGRA) 100 MG tablet Take 1 tablet (100 mg total) by mouth daily as needed for erectile dysfunction. 10 tablet 3  . warfarin (COUMADIN) 5 MG tablet Take 5 mg by mouth as directed.     . vitamin B-12 (CYANOCOBALAMIN) 1000 MCG tablet Take 1 tablet (1,000 mcg total) by mouth daily.     No facility-administered medications prior to visit.     Per HPI unless specifically indicated in ROS section below Review of Systems Objective:    BP (!) 146/79   Pulse 76   Temp 97.9 F (36.6 C)   Ht 5' 5.75" (1.67 m)   Wt 217 lb 2 oz (98.5 kg)   SpO2 98%   BMI 35.31 kg/m   Wt Readings  from Last 3 Encounters:  06/06/19 217 lb 2 oz (98.5 kg)  05/14/19 216 lb (98 kg)  03/06/19 216 lb (98 kg)     Physical exam: Gen: alert, NAD, not ill appearing Pulm: speaks in complete sentences without increased work of breathing Psych: normal mood, normal thought content      Assessment & Plan:   Problem List Items Addressed This Visit    Acute viral pharyngitis    Anticipate mild case of viral pharyngitis. Does not exhibit significant symptoms for Covid-19 infection, and symptoms largely improving at this time. I think ok to hold off on covid testing for now, further supportive care reviewed. Update if new or worsening symptoms, low threshold to send for testing. Pt and wife agree with plan.          No orders of the defined types were placed in this encounter.  No orders of the defined types were placed in this encounter.   I discussed the assessment and treatment plan with the patient. The patient was provided an opportunity to ask questions and all were answered. The patient agreed with the plan and demonstrated an understanding of the instructions. The patient was advised to call back or seek an in-person evaluation if the symptoms worsen or if the condition fails to improve as anticipated.  Follow up plan: No follow-ups on file.  Ria Bush, MD

## 2019-06-12 DIAGNOSIS — Z952 Presence of prosthetic heart valve: Secondary | ICD-10-CM | POA: Diagnosis not present

## 2019-06-26 DIAGNOSIS — Z952 Presence of prosthetic heart valve: Secondary | ICD-10-CM | POA: Diagnosis not present

## 2019-07-17 ENCOUNTER — Encounter: Payer: Self-pay | Admitting: Family Medicine

## 2019-07-21 DIAGNOSIS — G9340 Encephalopathy, unspecified: Secondary | ICD-10-CM | POA: Diagnosis not present

## 2019-07-21 DIAGNOSIS — R4189 Other symptoms and signs involving cognitive functions and awareness: Secondary | ICD-10-CM | POA: Diagnosis not present

## 2019-07-21 DIAGNOSIS — I639 Cerebral infarction, unspecified: Secondary | ICD-10-CM | POA: Diagnosis not present

## 2019-07-31 DIAGNOSIS — Z952 Presence of prosthetic heart valve: Secondary | ICD-10-CM | POA: Diagnosis not present

## 2019-08-07 DIAGNOSIS — F3342 Major depressive disorder, recurrent, in full remission: Secondary | ICD-10-CM | POA: Diagnosis not present

## 2019-08-07 DIAGNOSIS — I69311 Memory deficit following cerebral infarction: Secondary | ICD-10-CM | POA: Diagnosis not present

## 2019-08-07 DIAGNOSIS — F411 Generalized anxiety disorder: Secondary | ICD-10-CM | POA: Diagnosis not present

## 2019-08-07 DIAGNOSIS — F900 Attention-deficit hyperactivity disorder, predominantly inattentive type: Secondary | ICD-10-CM | POA: Diagnosis not present

## 2019-08-07 DIAGNOSIS — Z79899 Other long term (current) drug therapy: Secondary | ICD-10-CM | POA: Diagnosis not present

## 2019-08-11 ENCOUNTER — Other Ambulatory Visit: Payer: Self-pay | Admitting: Family Medicine

## 2019-08-12 MED ORDER — ACETAMINOPHEN-CODEINE #3 300-30 MG PO TABS: 1.0000 | ORAL_TABLET | Freq: Three times a day (TID) | ORAL | 0 refills | Status: DC | PRN

## 2019-08-12 NOTE — Telephone Encounter (Signed)
ERx 

## 2019-08-12 NOTE — Telephone Encounter (Signed)
Name of Medication: Tylenol #3 Name of Pharmacy: [facility] in [place] or Written Date and Quantity: 05/14/19, #15 Last Office Visit and Type: 03/06/19, f/u Next Office Visit and Type: none Last Controlled Substance Agreement Date: none Last UDS: none

## 2019-08-14 DIAGNOSIS — I421 Obstructive hypertrophic cardiomyopathy: Secondary | ICD-10-CM | POA: Diagnosis not present

## 2019-08-14 DIAGNOSIS — I951 Orthostatic hypotension: Secondary | ICD-10-CM | POA: Diagnosis not present

## 2019-08-14 DIAGNOSIS — Z952 Presence of prosthetic heart valve: Secondary | ICD-10-CM | POA: Diagnosis not present

## 2019-08-14 DIAGNOSIS — I480 Paroxysmal atrial fibrillation: Secondary | ICD-10-CM | POA: Diagnosis not present

## 2019-08-14 DIAGNOSIS — Z6835 Body mass index (BMI) 35.0-35.9, adult: Secondary | ICD-10-CM | POA: Diagnosis not present

## 2019-08-14 DIAGNOSIS — R5383 Other fatigue: Secondary | ICD-10-CM | POA: Diagnosis not present

## 2019-08-23 DIAGNOSIS — Z23 Encounter for immunization: Secondary | ICD-10-CM | POA: Diagnosis not present

## 2019-09-10 DIAGNOSIS — F3342 Major depressive disorder, recurrent, in full remission: Secondary | ICD-10-CM | POA: Diagnosis not present

## 2019-09-10 DIAGNOSIS — F411 Generalized anxiety disorder: Secondary | ICD-10-CM | POA: Diagnosis not present

## 2019-09-10 DIAGNOSIS — Z79899 Other long term (current) drug therapy: Secondary | ICD-10-CM | POA: Diagnosis not present

## 2019-09-10 DIAGNOSIS — I69311 Memory deficit following cerebral infarction: Secondary | ICD-10-CM | POA: Diagnosis not present

## 2019-09-10 DIAGNOSIS — F900 Attention-deficit hyperactivity disorder, predominantly inattentive type: Secondary | ICD-10-CM | POA: Diagnosis not present

## 2019-09-22 DIAGNOSIS — Z23 Encounter for immunization: Secondary | ICD-10-CM | POA: Diagnosis not present

## 2019-10-23 DIAGNOSIS — I4891 Unspecified atrial fibrillation: Secondary | ICD-10-CM | POA: Diagnosis not present

## 2019-11-05 DIAGNOSIS — Z952 Presence of prosthetic heart valve: Secondary | ICD-10-CM | POA: Diagnosis not present

## 2019-11-06 ENCOUNTER — Telehealth: Payer: Self-pay | Admitting: Family Medicine

## 2019-11-06 DIAGNOSIS — R5383 Other fatigue: Secondary | ICD-10-CM | POA: Diagnosis not present

## 2019-11-06 DIAGNOSIS — E291 Testicular hypofunction: Secondary | ICD-10-CM | POA: Diagnosis not present

## 2019-11-06 NOTE — Telephone Encounter (Signed)
Left message for patient to call back and schedule Medicare Annual Wellness Visit (AWV) with Nurse Health Advisor   This should be a telephone visit only.  Last AWV 06/24/18

## 2019-11-10 ENCOUNTER — Ambulatory Visit (INDEPENDENT_AMBULATORY_CARE_PROVIDER_SITE_OTHER): Payer: Medicare Other

## 2019-11-10 ENCOUNTER — Encounter: Payer: Self-pay | Admitting: Family Medicine

## 2019-11-10 DIAGNOSIS — Z0000 Encounter for general adult medical examination without abnormal findings: Secondary | ICD-10-CM | POA: Diagnosis not present

## 2019-11-10 NOTE — Patient Instructions (Signed)
Dylan Fletcher , Thank you for taking time to come for your Medicare Wellness Visit. I appreciate your ongoing commitment to your health goals. Please review the following plan we discussed and let me know if I can assist you in the future.   Screening recommendations/referrals: Colonoscopy: due Recommended yearly ophthalmology/optometry visit for glaucoma screening and checkup Recommended yearly dental visit for hygiene and checkup  Vaccinations: Influenza vaccine: Up to date, completed 04/14/2019 Pneumococcal vaccine: Up to date, completed 03/19/2015 Tdap vaccine: Up to date, completed 02/06/2013 Shingles vaccine: discussed    Advanced directives: copy in chart  Conditions/risks identified: hypertension, hyperlipidemia  Next appointment: none  Preventive Care 40-64 Years, Male Preventive care refers to lifestyle choices and visits with your health care provider that can promote health and wellness. What does preventive care include?  A yearly physical exam. This is also called an annual well check.  Dental exams once or twice a year.  Routine eye exams. Ask your health care provider how often you should have your eyes checked.  Personal lifestyle choices, including:  Daily care of your teeth and gums.  Regular physical activity.  Eating a healthy diet.  Avoiding tobacco and drug use.  Limiting alcohol use.  Practicing safe sex.  Taking low-dose aspirin every day starting at age 47. What happens during an annual well check? The services and screenings done by your health care provider during your annual well check will depend on your age, overall health, lifestyle risk factors, and family history of disease. Counseling  Your health care provider may ask you questions about your:  Alcohol use.  Tobacco use.  Drug use.  Emotional well-being.  Home and relationship well-being.  Sexual activity.  Eating habits.  Work and work Statistician. Screening  You  may have the following tests or measurements:  Height, weight, and BMI.  Blood pressure.  Lipid and cholesterol levels. These may be checked every 5 years, or more frequently if you are over 83 years old.  Skin check.  Lung cancer screening. You may have this screening every year starting at age 58 if you have a 30-pack-year history of smoking and currently smoke or have quit within the past 15 years.  Fecal occult blood test (FOBT) of the stool. You may have this test every year starting at age 54.  Flexible sigmoidoscopy or colonoscopy. You may have a sigmoidoscopy every 5 years or a colonoscopy every 10 years starting at age 15.  Prostate cancer screening. Recommendations will vary depending on your family history and other risks.  Hepatitis C blood test.  Hepatitis B blood test.  Sexually transmitted disease (STD) testing.  Diabetes screening. This is done by checking your blood sugar (glucose) after you have not eaten for a while (fasting). You may have this done every 1-3 years. Discuss your test results, treatment options, and if necessary, the need for more tests with your health care provider. Vaccines  Your health care provider may recommend certain vaccines, such as:  Influenza vaccine. This is recommended every year.  Tetanus, diphtheria, and acellular pertussis (Tdap, Td) vaccine. You may need a Td booster every 10 years.  Zoster vaccine. You may need this after age 34.  Pneumococcal 13-valent conjugate (PCV13) vaccine. You may need this if you have certain conditions and have not been vaccinated.  Pneumococcal polysaccharide (PPSV23) vaccine. You may need one or two doses if you smoke cigarettes or if you have certain conditions. Talk to your health care provider about which screenings  and vaccines you need and how often you need them. This information is not intended to replace advice given to you by your health care provider. Make sure you discuss any questions  you have with your health care provider. Document Released: 07/02/2015 Document Revised: 02/23/2016 Document Reviewed: 04/06/2015 Elsevier Interactive Patient Education  2017 [place] Prevention in the Home Falls can cause injuries. They can happen to people of all ages. There are many things you can do to make your home safe and to help prevent falls. What can I do on the outside of my home?  Regularly fix the edges of walkways and driveways and fix any cracks.  Remove anything that might make you trip as you walk through a door, such as a raised step or threshold.  Trim any bushes or trees on the path to your home.  Use bright outdoor lighting.  Clear any walking paths of anything that might make someone trip, such as rocks or tools.  Regularly check to see if handrails are loose or broken. Make sure that both sides of any steps have handrails.  Any raised decks and porches should have guardrails on the edges.  Have any leaves, snow, or ice cleared regularly.  Use sand or salt on walking paths during winter.  Clean up any spills in your garage right away. This includes oil or grease spills. What can I do in the bathroom?  Use night lights.  Install grab bars by the toilet and in the tub and shower. Do not use towel bars as grab bars.  Use non-skid mats or decals in the tub or shower.  If you need to sit down in the shower, use a plastic, non-slip stool.  Keep the floor dry. Clean up any water that spills on the floor as soon as it happens.  Remove soap buildup in the tub or shower regularly.  Attach bath mats securely with double-sided non-slip rug tape.  Do not have throw rugs and other things on the floor that can make you trip. What can I do in the bedroom?  Use night lights.  Make sure that you have a light by your bed that is easy to reach.  Do not use any sheets or blankets that are too big for your bed. They should not hang down onto the  floor.  Have a firm chair that has side arms. You can use this for support while you get dressed.  Do not have throw rugs and other things on the floor that can make you trip. What can I do in the kitchen?  Clean up any spills right away.  Avoid walking on wet floors.  Keep items that you use a lot in easy-to-reach places.  If you need to reach something above you, use a strong step stool that has a grab bar.  Keep electrical cords out of the way.  Do not use floor polish or wax that makes floors slippery. If you must use wax, use non-skid floor wax.  Do not have throw rugs and other things on the floor that can make you trip. What can I do with my stairs?  Do not leave any items on the stairs.  Make sure that there are handrails on both sides of the stairs and use them. Fix handrails that are broken or loose. Make sure that handrails are as long as the stairways.  Check any carpeting to make sure that it is firmly attached to the stairs. Fix  any carpet that is loose or worn.  Avoid having throw rugs at the top or bottom of the stairs. If you do have throw rugs, attach them to the floor with carpet tape.  Make sure that you have a light switch at the top of the stairs and the bottom of the stairs. If you do not have them, ask someone to add them for you. What else can I do to help prevent falls?  Wear shoes that:  Do not have high heels.  Have rubber bottoms.  Are comfortable and fit you well.  Are closed at the toe. Do not wear sandals.  If you use a stepladder:  Make sure that it is fully opened. Do not climb a closed stepladder.  Make sure that both sides of the stepladder are locked into place.  Ask someone to hold it for you, if possible.  Clearly mark and make sure that you can see:  Any grab bars or handrails.  First and last steps.  Where the edge of each step is.  Use tools that help you move around (mobility aids) if they are needed. These  include:  Canes.  Walkers.  Scooters.  Crutches.  Turn on the lights when you go into a dark area. Replace any light bulbs as soon as they burn out.  Set up your furniture so you have a clear path. Avoid moving your furniture around.  If any of your floors are uneven, fix them.  If there are any pets around you, be aware of where they are.  Review your medicines with your doctor. Some medicines can make you feel dizzy. This can increase your chance of falling. Ask your doctor what other things that you can do to help prevent falls. This information is not intended to replace advice given to you by your health care provider. Make sure you discuss any questions you have with your health care provider. Document Released: 04/01/2009 Document Revised: 11/11/2015 Document Reviewed: 07/10/2014 Elsevier Interactive Patient Education  2017 [employer].

## 2019-11-10 NOTE — Telephone Encounter (Signed)
Noted. fyi to [place].

## 2019-11-10 NOTE — Progress Notes (Signed)
Subjective:   Page Ramaglia is a 59 y.o. male who presents for Medicare Annual/Subsequent preventive examination.  Review of Systems: N/A   I connected with the patient today by telephone and verified that I am speaking with the correct person using two identifiers. Location patient: home Location nurse: work Persons participating in the virtual visit: patient, Marine scientist.   I discussed the limitations, risks, security and privacy concerns of performing an evaluation and management service by telephone and the availability of in person appointments. I also discussed with the patient that there may be a patient responsible charge related to this service. The patient expressed understanding and verbally consented to this telephonic visit.    Interactive audio and video telecommunications were attempted between this nurse and patient, however failed, due to patient having technical difficulties OR patient did not have access to video capability.  We continued and completed visit with audio only.     Cardiac Risk Factors include: advanced age (>10men, >54 women);hypertension;dyslipidemia;male gender     Objective:    Vitals: There were no vitals taken for this visit.  There is no height or weight on file to calculate BMI.  Advanced Directives 11/10/2019 01/29/2016 12/18/2014  Does Patient Have a Medical Advance Directive? Yes No No  Type of Paramedic of [place];Living will - -  Copy of [place] in Chart? Yes - validated most recent copy scanned in chart (See row information) - -  Would patient like information on creating a medical advance directive? - No - patient declined information Yes - Educational materials given    Tobacco Social History   Tobacco Use  Smoking Status Never Smoker  Smokeless Tobacco Never Used     Counseling given: Not Answered   Clinical Intake:  Pre-visit preparation completed: Yes  Pain : No/denies pain     Nutritional Risks: None Diabetes: No  How often do you need to have someone help you when you read instructions, pamphlets, or other written materials from your doctor or pharmacy?: 1 - Never  Interpreter Needed?: No  Information entered by :: CJohnson, LPN  Past Medical History:  Diagnosis Date  . ADD (attention deficit disorder)    sees psych in [place], [place] (meds from there)  . Allergic rhinitis    Ragweed, mold, mildew  . Anxiety associated with depression    sees psych in [place], [place] (meds from there)  . Arrhythmia   . Claustrophobia   . Colon polyp 04/2009   rectal tubular adenoma, rec rpt 3 yrs  . CVA (cerebral infarction) 08/2013   L thalamic lacunar infarct post CVTS surgery, s/p neuropsychology testing, completed speech therapy. no ASA 2/2 no CAD hx  . ED (erectile dysfunction)   . H/O mitral valve replacement 08/2013   St Jude, needs abx ppx, completed cardiac rehab 12/2013  . History of asthma    as child  . HLD (hyperlipidemia)   . HTN (hypertension)   . Hypertrophic obstructive cardiomyopathy(425.11)    Cards ([facility]odell [facility]ipro [facility] - need abx prophylaxis - now established with [facility] Dr. Rosetta Posner [phone]) and Dr. Shearon Stalls and Octavia Heir s/p myomectomy, now cardiac rehab [facility] 09/2013  . LBBB (left bundle branch block) 08/2013   after myomectomy  . Migraine with aura   . Need for prophylactic antibiotic   . Obesity    saw nutritionist 12/18/2014  . OSA (obstructive sleep apnea)    CPAP at night, up to 16 mmHg (  Dr [place] Crane at [facility])  . Paroxysmal atrial fibrillation ([place]) 04/2013, 05/2013   with RVR; s/p multiple hospitalizations, spontaneous conversion, referred to Dr. Manuella Ghazi EP, then recurrence - failed sotalol, multaq (both with spont conversions)  . Transient alteration of awareness 05/2014   during hospitalization pending EEG [facility])  . Vitamin D deficiency   . Warfarin anticoagulation    goal INR 2.5-3.5   Past Surgical History:    Procedure Laterality Date  . CARDIAC CATHETERIZATION  2014   done for chest pain, no plaque buildup  . COLONOSCOPY  04/2009   rectal tubular adenoma x1, rpt 3 yrs  . COLONOSCOPY  04/2014   mild diverticulosis, no polyps, rec rpt 5 yrs [facility])  . [place]; 1971  . EYE SURGERY Right 04/2015   for PVD  . hospitalization  04/2014   subtherapeutic INR, heparin bridge  . INGUINAL HERNIA REPAIR  1987  . KNEE ARTHROSCOPY  2006; 2007   right  . MITRAL VALVE REPLACEMENT  08/2013   St Jude MV for severe central mitral regurg  . MYOMECTOMY  08/22/2013   with MAZE procedure [facility] (Drs [place] Morelle)  . PFTs  07/2013   FVC 79%, FEV1 72%, ratio 0.71 - mild obstruction, o/w normal  . VASECTOMY  1992  . WISDOM TOOTH EXTRACTION  1977   Family History  Problem Relation Age of Onset  . Alcohol abuse Brother   . Cancer Mother 5       breast  . Ulcers Mother   . Heart disease Mother        HOCM  . Psoriasis Father   . Cancer Maternal Grandfather        colon  . Coronary artery disease Paternal Grandfather 53       MI  . Diabetes Neg Hx   . Stroke Neg Hx    Social History   Socioeconomic History  . Marital status: Married    Spouse name: Not on file  . Number of children: Not on file  . Years of education: Not on file  . Highest education level: Not on file  Occupational History  . Not on file  Tobacco Use  . Smoking status: Never Smoker  . Smokeless tobacco: Never Used  Substance and Sexual Activity  . Alcohol use: Yes    Comment: rarely  . Drug use: No  . Sexual activity: Not on file  Other Topics Concern  . Not on file  Social History Narrative   Caffeine: quart of soda/day   Lives with wife [name]), 3 cats. No children.   Occupation: Nurse, children's   Edu: Bachelor's degree   Activity: walking several times a week   Diet: good water, fruits/vegetables daily       Cards: Dr Jodell Cipro [facility] (HOCM) and Dr Arbutus Leas (EP), as well as [facility] Dr Su Monks   Social Determinants of Health   Financial Resource Strain: Low Risk   . Difficulty of Paying Living Expenses: Not hard at all  Food Insecurity: No Food Insecurity  . Worried About Charity fundraiser in the Last Year: Never true  . Ran Out of Food in the Last Year: Never true  Transportation Needs: No Transportation Needs  . Lack of Transportation (Medical): No  . Lack of Transportation (Non-Medical): No  Physical Activity: Sufficiently Active  . Days of Exercise per Week: 3 days  . Minutes of Exercise per Session: 60 min  Stress: No Stress  Concern Present  . Feeling of Stress : Not at all  Social Connections:   . Frequency of Communication with Friends and Family:   . Frequency of Social Gatherings with Friends and Family:   . Attends Religious Services:   . Active Member of Clubs or Organizations:   . Attends Archivist Meetings:   Marland Kitchen Marital Status:     Outpatient Encounter Medications as of 11/10/2019  Medication Sig  . acetaminophen (TYLENOL) 500 MG tablet Take 1 tablet (500 mg total) by mouth 2 (two) times daily.  Marland Kitchen acetaminophen-codeine (TYLENOL #3) 300-30 MG tablet Take 1 tablet by mouth 3 (three) times daily as needed for moderate pain (headache).  Marland Kitchen amoxicillin (AMOXIL) 500 MG tablet Take 500 mg by mouth as directed. 1 BID; start 24 hours prior to dental procedure and 24 hours post dental procedure  . amphetamine-dextroamphetamine (ADDERALL XR) 30 MG 24 hr capsule Take 1 capsule by mouth daily. In the morning  . amphetamine-dextroamphetamine (ADDERALL) 30 MG tablet Take 30 mg by mouth daily. Takes in the afternoon  . buPROPion (WELLBUTRIN XL) 150 MG 24 hr tablet Take 1 tablet by mouth daily.  . cetirizine (ZYRTEC) 10 MG tablet Take 10 mg by mouth daily as needed.   . Cholecalciferol (VITAMIN D) 2000 UNITS CAPS Take 1 capsule by mouth daily.  . clomiPHENE (SEROPHENE) 50 MG tablet Take 25 mg by mouth once a week.   . famotidine (PEPCID) 20 MG tablet Take  20 mg by mouth daily.  . furosemide (LASIX) 20 MG tablet Take 1 tablet (20 mg total) by mouth daily. With extra prn weight gain  . Glucosamine Sulfate 500 MG CAPS Take 1 capsule by mouth daily.   Marland Kitchen liothyronine (CYTOMEL) 5 MCG tablet Take 2 tablets by mouth daily.  . metoprolol succinate (TOPROL-XL) 25 MG 24 hr tablet Take 25 mg by mouth daily. Takes 2 tablets daily  . Multiple Vitamin (MULTIVITAMIN) tablet Take 1 tablet by mouth daily.  Marland Kitchen oxyCODONE-acetaminophen (PERCOCET/ROXICET) 5-325 MG per tablet Take 1 tablet by mouth every 4 (four) hours as needed for severe pain.  . sildenafil (VIAGRA) 100 MG tablet Take 1 tablet (100 mg total) by mouth daily as needed for erectile dysfunction.  Marland Kitchen warfarin (COUMADIN) 5 MG tablet Take 5 mg by mouth as directed.    No facility-administered encounter medications on file as of 11/10/2019.    Activities of Daily Living In your present state of health, do you have any difficulty performing the following activities: 11/10/2019  Hearing? Y  Comment losing low end hearing in left ear  Vision? N  Difficulty concentrating or making decisions? Y  Comment has brain injury, short term memory loss  Walking or climbing stairs? N  Dressing or bathing? N  Doing errands, shopping? N  Preparing Food and eating ? N  Using the Toilet? N  In the past six months, have you accidently leaked urine? N  Do you have problems with loss of bowel control? N  Managing your Medications? N  Managing your Finances? N  Housekeeping or managing your Housekeeping? N  Some recent data might be hidden    Patient Care Team: Ria Bush, MD as PCP - General (Family Medicine) Curly Shores, MD (Cardiology) Ezekiel Ina, MD (Inactive) (Cardiology)   Assessment:   This is a routine wellness examination for Rakshan.  Exercise Activities and Dietary recommendations Current Exercise Habits: Home exercise routine, Type of exercise: Other - see comments, Time (Minutes): 60,  Frequency (Times/Week):  3, Weekly Exercise (Minutes/Week): 180, Intensity: Moderate, Exercise limited by: None identified  Goals    . Patient Stated     11/10/2019, I will continue to exercise 2-3 times a week for 1- 1 1/2 hours.       Fall Risk Fall Risk  11/10/2019 06/24/2018 12/18/2014  Falls in the past year? 0 1 No  Number falls in past yr: 0 0 -  Injury with Fall? 0 0 -  Risk for fall due to : Mental status change - -  Follow up Falls evaluation completed;Falls prevention discussed - -   Is the patient's home free of loose throw rugs in walkways, pet beds, electrical cords, etc?   yes      Grab bars in the bathroom? no      Handrails on the stairs?   yes      Adequate lighting?   yes  Timed Get Up and Go Performed: N/A  Depression Screen PHQ 2/9 Scores 11/10/2019 06/24/2018 06/24/2018 06/18/2017  PHQ - 2 Score 0 0 0 0  PHQ- 9 Score 0 - - -  Exception Documentation - - Patient refusal -    Cognitive Function MMSE - Mini Mental State Exam 11/10/2019  Not completed: Unable to complete       Mini Cog  Mini-Cog screen was not completed. Patient has short term memory loss due to brain injury. Maximum score is 22. A value of 0 denotes this part of the MMSE was not completed or the patient failed this part of the Mini-Cog screening.  Immunization History  Administered Date(s) Administered  . Influenza,inj,Quad PF,6+ Mos 03/19/2015, 03/17/2016, 03/19/2018, 04/04/2019  . Influenza,inj,quad, With Preservative 03/19/2016  . Influenza-Unspecified 03/19/2014, 03/16/2017  . Pneumococcal Polysaccharide-23 03/19/2015  . Tdap 02/06/2013    Qualifies for Shingles Vaccine: Yes  Screening Tests Health Maintenance  Topic Date Due  . DTAP VACCINES (1) 04/14/1961  . COVID-19 Vaccine (1) Never done  . HIV Screening  Never done  . COLONOSCOPY  05/10/2014  . INFLUENZA VACCINE  01/18/2020  . DTaP/Tdap/Td (2 - Td) 02/07/2023  . TETANUS/TDAP  02/07/2023  . Hepatitis C Screening  Completed     Cancer Screenings: Lung: Low Dose CT Chest recommended if Age 72-80 years, 30 pack-year currently smoking OR have quit w/in 15 years. Patient does not qualify. Colorectal: due  Additional Screenings:  Hepatitis C Screening: 06/09/2016      Plan:    Patient will continue to exercise 2-3 times a week for 1- 1 1/2 hours.   I have personally reviewed and noted the following in the patient's chart:   . Medical and social history . Use of alcohol, tobacco or illicit drugs  . Current medications and supplements . Functional ability and status . Nutritional status . Physical activity . Advanced directives . List of other physicians . Hospitalizations, surgeries, and ER visits in previous 12 months . Vitals . Screenings to include cognitive, depression, and falls . Referrals and appointments  In addition, I have reviewed and discussed with patient certain preventive protocols, quality metrics, and best practice recommendations. A written personalized care plan for preventive services as well as general preventive health recommendations were provided to patient.     Andrez Grime, LPN  [phone]

## 2019-11-10 NOTE — Progress Notes (Signed)
PCP notes:  Health Maintenance: Colonoscopy- due   Abnormal Screenings: none   Patient concerns: none   Nurse concerns: none   Next PCP appt.: none 

## 2019-11-11 ENCOUNTER — Encounter: Payer: Self-pay | Admitting: Family Medicine

## 2019-11-11 NOTE — Progress Notes (Signed)
Subjective:   Dylan Fletcher is a 59 y.o. male who presents for Medicare Annual/Subsequent preventive examination.  Review of Systems: N/A   I connected with the patient today by telephone and verified that I am speaking with the correct person using two identifiers. Location patient: home Location nurse: work Persons participating in the virtual visit: patient, Marine scientist.   I discussed the limitations, risks, security and privacy concerns of performing an evaluation and management service by telephone and the availability of in person appointments. I also discussed with the patient that there may be a patient responsible charge related to this service. The patient expressed understanding and verbally consented to this telephonic visit.    Interactive audio and video telecommunications were attempted between this nurse and patient, however failed, due to patient having technical difficulties OR patient did not have access to video capability.  We continued and completed visit with audio only.     Cardiac Risk Factors include: advanced age (>38men, >54 women);hypertension;dyslipidemia;male gender     Objective:    Vitals: There were no vitals taken for this visit.  There is no height or weight on file to calculate BMI.  Advanced Directives 11/10/2019 01/29/2016 12/18/2014  Does Patient Have a Medical Advance Directive? No No No  Does patient want to make changes to medical advance directive? No - Patient declined - -  Would patient like information on creating a medical advance directive? - No - patient declined information Yes Higher education careers adviser given    Tobacco Social History   Tobacco Use  Smoking Status Never Smoker  Smokeless Tobacco Never Used     Counseling given: Not Answered   Clinical Intake:  Pre-visit preparation completed: Yes  Pain : No/denies pain     Nutritional Risks: None Diabetes: No  How often do you need to have someone help you when you read  instructions, pamphlets, or other written materials from your doctor or pharmacy?: 1 - Never  Interpreter Needed?: No  Information entered by :: CJohnson, LPN  Past Medical History:  Diagnosis Date  . ADD (attention deficit disorder)    sees psych in [place], [place] (meds from there)  . Allergic rhinitis    Ragweed, mold, mildew  . Anxiety associated with depression    sees psych in [place], [place] (meds from there)  . Arrhythmia   . Claustrophobia   . Colon polyp 04/2009   rectal tubular adenoma, rec rpt 3 yrs  . CVA (cerebral infarction) 08/2013   L thalamic lacunar infarct post CVTS surgery, s/p neuropsychology testing, completed speech therapy. no ASA 2/2 no CAD hx  . ED (erectile dysfunction)   . H/O mitral valve replacement 08/2013   St Jude, needs abx ppx, completed cardiac rehab 12/2013  . History of asthma    as child  . HLD (hyperlipidemia)   . HTN (hypertension)   . Hypertrophic obstructive cardiomyopathy(425.11)    Cards (Dr. Jodell Cipro [facility] - need abx prophylaxis - now established with [facility] Dr. Rosetta Posner [phone]) and Dr. Shearon Stalls and Octavia Heir s/p myomectomy, now cardiac rehab [facility] 09/2013  . LBBB (left bundle branch block) 08/2013   after myomectomy  . Migraine with aura   . Need for prophylactic antibiotic   . Obesity    saw nutritionist 12/18/2014  . OSA (obstructive sleep apnea)    CPAP at night, up to 16 mmHg (Dr [place] Crane at [facility])  . Paroxysmal atrial fibrillation ([place]) 04/2013, 05/2013   with RVR; s/p  multiple hospitalizations, spontaneous conversion, referred to Dr. Manuella Ghazi EP, then recurrence - failed sotalol, multaq (both with spont conversions)  . Transient alteration of awareness 05/2014   during hospitalization pending EEG [facility])  . Vitamin D deficiency   . Warfarin anticoagulation    goal INR 2.5-3.5   Past Surgical History:  Procedure Laterality Date  . CARDIAC CATHETERIZATION  2014   done for chest pain, no plaque buildup    . COLONOSCOPY  04/2009   rectal tubular adenoma x1, rpt 3 yrs  . COLONOSCOPY  04/2014   mild diverticulosis, no polyps, rec rpt 5 yrs [facility])  . [place]; 1971  . EYE SURGERY Right 04/2015   for PVD  . hospitalization  04/2014   subtherapeutic INR, heparin bridge  . INGUINAL HERNIA REPAIR  1987  . KNEE ARTHROSCOPY  2006; 2007   right  . MITRAL VALVE REPLACEMENT  08/2013   St Jude MV for severe central mitral regurg  . MYOMECTOMY  08/22/2013   with MAZE procedure [facility] (Drs [place])  . PFTs  07/2013   FVC 79%, FEV1 72%, ratio 0.71 - mild obstruction, o/w normal  . VASECTOMY  1992  . WISDOM TOOTH EXTRACTION  1977   Family History  Problem Relation Age of Onset  . Alcohol abuse Brother   . Cancer Mother 46       breast  . Ulcers Mother   . Heart disease Mother        HOCM  . Psoriasis Father   . Cancer Maternal Grandfather        colon  . Coronary artery disease Paternal Grandfather 67       MI  . Diabetes Neg Hx   . Stroke Neg Hx    Social History   Socioeconomic History  . Marital status: Married    Spouse name: Not on file  . Number of children: Not on file  . Years of education: Not on file  . Highest education level: Not on file  Occupational History  . Not on file  Tobacco Use  . Smoking status: Never Smoker  . Smokeless tobacco: Never Used  Substance and Sexual Activity  . Alcohol use: Yes    Comment: rarely  . Drug use: No  . Sexual activity: Not on file  Other Topics Concern  . Not on file  Social History Narrative   Caffeine: quart of soda/day   Lives with wife [name]), 3 cats. No children.   Occupation: Nurse, children's   Edu: Bachelor's degree   Activity: walking several times a week   Diet: good water, fruits/vegetables daily       Cards: Dr Jodell Cipro [facility] ([facility]) and Dr Arbutus Leas (EP), as well as [facility] Dr Su Monks   Social Determinants of Health   Financial Resource Strain: Low Risk   . Difficulty of  Paying Living Expenses: Not hard at all  Food Insecurity: No Food Insecurity  . Worried About Charity fundraiser in the Last Year: Never true  . Ran Out of Food in the Last Year: Never true  Transportation Needs: No Transportation Needs  . Lack of Transportation (Medical): No  . Lack of Transportation (Non-Medical): No  Physical Activity: Sufficiently Active  . Days of Exercise per Week: 3 days  . Minutes of Exercise per Session: 60 min  Stress: No Stress Concern Present  . Feeling of Stress : Not at all  Social Connections:   .  Frequency of Communication with Friends and Family:   . Frequency of Social Gatherings with Friends and Family:   . Attends Religious Services:   . Active Member of Clubs or Organizations:   . Attends Archivist Meetings:   Marland Kitchen Marital Status:     Outpatient Encounter Medications as of 11/10/2019  Medication Sig  . acetaminophen (TYLENOL) 500 MG tablet Take 1 tablet (500 mg total) by mouth 2 (two) times daily.  Marland Kitchen acetaminophen-codeine (TYLENOL #3) 300-30 MG tablet Take 1 tablet by mouth 3 (three) times daily as needed for moderate pain (headache).  Marland Kitchen amoxicillin (AMOXIL) 500 MG tablet Take 500 mg by mouth as directed. 1 BID; start 24 hours prior to dental procedure and 24 hours post dental procedure  . amphetamine-dextroamphetamine (ADDERALL XR) 30 MG 24 hr capsule Take 1 capsule by mouth daily. In the morning  . amphetamine-dextroamphetamine (ADDERALL) 30 MG tablet Take 30 mg by mouth daily. Takes in the afternoon  . buPROPion (WELLBUTRIN XL) 150 MG 24 hr tablet Take 1 tablet by mouth daily.  . cetirizine (ZYRTEC) 10 MG tablet Take 10 mg by mouth daily as needed.   . Cholecalciferol (VITAMIN D) 2000 UNITS CAPS Take 1 capsule by mouth daily.  . clomiPHENE (SEROPHENE) 50 MG tablet Take 25 mg by mouth once a week.   . famotidine (PEPCID) 20 MG tablet Take 20 mg by mouth daily.  . furosemide (LASIX) 20 MG tablet Take 1 tablet (20 mg total) by mouth  daily. With extra prn weight gain  . Glucosamine Sulfate 500 MG CAPS Take 1 capsule by mouth daily.   Marland Kitchen liothyronine (CYTOMEL) 5 MCG tablet Take 2 tablets by mouth daily.  . metoprolol succinate (TOPROL-XL) 25 MG 24 hr tablet Take 25 mg by mouth daily. Takes 2 tablets daily  . Multiple Vitamin (MULTIVITAMIN) tablet Take 1 tablet by mouth daily.  Marland Kitchen oxyCODONE-acetaminophen (PERCOCET/ROXICET) 5-325 MG per tablet Take 1 tablet by mouth every 4 (four) hours as needed for severe pain.  . sildenafil (VIAGRA) 100 MG tablet Take 1 tablet (100 mg total) by mouth daily as needed for erectile dysfunction.  Marland Kitchen warfarin (COUMADIN) 5 MG tablet Take 5 mg by mouth as directed.    No facility-administered encounter medications on file as of 11/10/2019.    Activities of Daily Living In your present state of health, do you have any difficulty performing the following activities: 11/10/2019  Hearing? Y  Comment losing low end hearing in left ear  Vision? N  Difficulty concentrating or making decisions? Y  Comment has brain injury, short term memory loss  Walking or climbing stairs? N  Dressing or bathing? N  Doing errands, shopping? N  Preparing Food and eating ? N  Using the Toilet? N  In the past six months, have you accidently leaked urine? N  Do you have problems with loss of bowel control? N  Managing your Medications? Y  Managing your Finances? Y  Housekeeping or managing your Housekeeping? N  Some recent data might be hidden    Patient Care Team: Ria Bush, MD as PCP - General (Family Medicine) Curly Shores, MD (Cardiology) Ezekiel Ina, MD (Inactive) (Cardiology)   Assessment:   This is a routine wellness examination for Demontre.  Exercise Activities and Dietary recommendations Current Exercise Habits: Home exercise routine, Type of exercise: Other - see comments, Time (Minutes): 60, Frequency (Times/Week): 3, Weekly Exercise (Minutes/Week): 180, Intensity: Moderate, Exercise limited  by: None identified  Goals    .  Patient Stated     11/10/2019, I will continue to exercise 2-3 times a week for 1- 1 1/2 hours.       Fall Risk Fall Risk  11/10/2019 06/24/2018 12/18/2014  Falls in the past year? 0 1 No  Number falls in past yr: 0 0 -  Injury with Fall? 0 0 -  Risk for fall due to : Mental status change - -  Follow up Falls evaluation completed;Falls prevention discussed - -   Is the patient's home free of loose throw rugs in walkways, pet beds, electrical cords, etc?   yes      Grab bars in the bathroom? no      Handrails on the stairs?   yes      Adequate lighting?   yes  Timed Get Up and Go Performed: N/A  Depression Screen PHQ 2/9 Scores 11/10/2019 06/24/2018 06/24/2018 06/18/2017  PHQ - 2 Score 0 0 0 0  PHQ- 9 Score 0 - - -  Exception Documentation - - Patient refusal -    Cognitive Function MMSE - Mini Mental State Exam 11/10/2019  Not completed: Unable to complete       Mini Cog  Mini-Cog screen was not completed. Patient has short term memory loss due to brain injury. Maximum score is 22. A value of 0 denotes this part of the MMSE was not completed or the patient failed this part of the Mini-Cog screening.  Immunization History  Administered Date(s) Administered  . Influenza,inj,Quad PF,6+ Mos 03/19/2015, 03/17/2016, 03/19/2018, 04/04/2019  . Influenza,inj,quad, With Preservative 03/19/2016  . Influenza-Unspecified 03/19/2014, 03/16/2017  . Pneumococcal Polysaccharide-23 03/19/2015  . Tdap 02/06/2013    Qualifies for Shingles Vaccine: Yes  Screening Tests Health Maintenance  Topic Date Due  . DTAP VACCINES (1) 04/14/1961  . COVID-19 Vaccine (1) Never done  . HIV Screening  Never done  . COLONOSCOPY  05/10/2014  . INFLUENZA VACCINE  01/18/2020  . DTaP/Tdap/Td (2 - Td) 02/07/2023  . TETANUS/TDAP  02/07/2023  . Hepatitis C Screening  Completed   Cancer Screenings: Lung: Low Dose CT Chest recommended if Age 21-80 years, 30 pack-year currently  smoking OR have quit w/in 15 years. Patient does not qualify. Colorectal: due  Additional Screenings:  Hepatitis C Screening: 06/09/2016      Plan:    Patient will continue to exercise 2-3 times a week for 1- 1 1/2 hours.   I have personally reviewed and noted the following in the patient's chart:   . Medical and social history . Use of alcohol, tobacco or illicit drugs  . Current medications and supplements . Functional ability and status . Nutritional status . Physical activity . Advanced directives . List of other physicians . Hospitalizations, surgeries, and ER visits in previous 12 months . Vitals . Screenings to include cognitive, depression, and falls . Referrals and appointments  In addition, I have reviewed and discussed with patient certain preventive protocols, quality metrics, and best practice recommendations. A written personalized care plan for preventive services as well as general preventive health recommendations were provided to patient.     Andrez Grime, LPN  [id number]

## 2019-11-29 DIAGNOSIS — S0990XA Unspecified injury of head, initial encounter: Secondary | ICD-10-CM | POA: Diagnosis not present

## 2019-12-01 ENCOUNTER — Telehealth: Payer: Self-pay | Admitting: Family Medicine

## 2019-12-01 NOTE — Telephone Encounter (Signed)
I'm sorry he had this tree branch injury  Ok to schedule next week - schedule for Tuesday (10 days after staples placed).

## 2019-12-01 NOTE — Telephone Encounter (Signed)
Left voicemail for patient to call back. 

## 2019-12-01 NOTE — Telephone Encounter (Signed)
Patient was in the ED and had staples placed .  His wife called today to see if they were able to come next Monday to have you remove them.  She stated that the Staples are located on the patient's scalp .   Is this okay to schedule?

## 2019-12-09 ENCOUNTER — Encounter: Payer: Self-pay | Admitting: Family Medicine

## 2019-12-09 ENCOUNTER — Other Ambulatory Visit: Payer: Self-pay

## 2019-12-09 ENCOUNTER — Ambulatory Visit (INDEPENDENT_AMBULATORY_CARE_PROVIDER_SITE_OTHER): Payer: Medicare Other | Admitting: Family Medicine

## 2019-12-09 VITALS — BP 130/78 | HR 82 | Temp 97.6°F | Ht 65.75 in | Wt 216.1 lb

## 2019-12-09 DIAGNOSIS — E291 Testicular hypofunction: Secondary | ICD-10-CM

## 2019-12-09 DIAGNOSIS — S0101XA Laceration without foreign body of scalp, initial encounter: Secondary | ICD-10-CM

## 2019-12-09 NOTE — Assessment & Plan Note (Signed)
Healing well with staples, these were removed today. Pt tolerated well. After care handout provided. Update if any concerns.

## 2019-12-09 NOTE — Progress Notes (Signed)
This visit was conducted in person.  BP 130/78 (BP Location: Left Arm, Patient Position: Sitting, Cuff Size: Normal)   Pulse 82   Temp 97.6 F (36.4 C) (Temporal)   Ht 5' 5.75" (1.67 m)   Wt 216 lb 2 oz (98 kg)   SpO2 96%   BMI 35.15 kg/m    CC: staple removal  Subjective:    Patient ID: Dylan Fletcher, male    DOB: [birth year], 59 y.o.   MRN: [id number]  HPI: Dylan Fletcher is a 59 y.o. male presenting on 12/09/2019 for Suture / Staple Removal (Here to have 5 staples removed in scalp.  Pt accompanied by wife, [name]- temp 97.9.)   DOI: 11/29/2019 - hit head on tree branch while mowing the lawn.  Seen at [facility] ER with reassuring CT brain.  Tdap updated.   Here for staple removal 10 days after staples were placed.  INR yesterday 2.8.   Clomid was stopped - will need to monitor triglycerides (previously elevated.      Relevant past medical, surgical, family and social history reviewed and updated as indicated. Interim medical history since our last visit reviewed. Allergies and medications reviewed and updated. Outpatient Medications Prior to Visit  Medication Sig Dispense Refill  . acetaminophen (TYLENOL) 500 MG tablet Take 1 tablet (500 mg total) by mouth 2 (two) times daily.    Marland Kitchen acetaminophen-codeine (TYLENOL #3) 300-30 MG tablet Take 1 tablet by mouth 3 (three) times daily as needed for moderate pain (headache). 15 tablet 0  . amoxicillin (AMOXIL) 500 MG tablet Take 500 mg by mouth as directed. 1 BID; start 24 hours prior to dental procedure and 24 hours post dental procedure    . amphetamine-dextroamphetamine (ADDERALL XR) 30 MG 24 hr capsule Take 1 capsule by mouth daily. In the morning    . amphetamine-dextroamphetamine (ADDERALL) 30 MG tablet Take 30 mg by mouth daily. Takes in the afternoon  0  . buPROPion (WELLBUTRIN XL) 150 MG 24 hr tablet Take 1 tablet by mouth daily.    . cetirizine (ZYRTEC) 10 MG tablet Take 10 mg by mouth daily as needed.     .  Cholecalciferol (VITAMIN D) 2000 UNITS CAPS Take 1 capsule by mouth daily.    . famotidine (PEPCID) 20 MG tablet Take 20 mg by mouth daily.    . furosemide (LASIX) 20 MG tablet Take 1 tablet (20 mg total) by mouth daily. With extra prn weight gain    . Glucosamine Sulfate 500 MG CAPS Take 1 capsule by mouth daily.     Marland Kitchen liothyronine (CYTOMEL) 5 MCG tablet Take 2 tablets by mouth daily.    . metoprolol succinate (TOPROL-XL) 25 MG 24 hr tablet Take 25 mg by mouth daily. Takes 2 tablets daily    . Multiple Vitamin (MULTIVITAMIN) tablet Take 1 tablet by mouth daily.    Marland Kitchen oxyCODONE-acetaminophen (PERCOCET/ROXICET) 5-325 MG per tablet Take 1 tablet by mouth every 4 (four) hours as needed for severe pain.    . sildenafil (VIAGRA) 100 MG tablet Take 1 tablet (100 mg total) by mouth daily as needed for erectile dysfunction. 10 tablet 3  . warfarin (COUMADIN) 5 MG tablet Take 5 mg by mouth as directed.     . clomiPHENE (SEROPHENE) 50 MG tablet Take 25 mg by mouth once a week.      No facility-administered medications prior to visit.     Per HPI unless specifically indicated in ROS section below Review of Systems  Objective:  BP 130/78 (BP Location: Left Arm, Patient Position: Sitting, Cuff Size: Normal)   Pulse 82   Temp 97.6 F (36.4 C) (Temporal)   Ht 5' 5.75" (1.67 m)   Wt 216 lb 2 oz (98 kg)   SpO2 96%   BMI 35.15 kg/m   Wt Readings from Last 3 Encounters:  12/09/19 216 lb 2 oz (98 kg)  06/06/19 217 lb 2 oz (98.5 kg)  05/14/19 216 lb (98 kg)      Physical Exam Vitals and nursing note reviewed.  Constitutional:      Appearance: Normal appearance. He is obese. He is not ill-appearing.  HENT:     Head:      Comments:  Scab covering laceration, with edges well approximated. Evidence of hematoma surrounding laceration.  5 staples removed, patient tolerated well.  Wound remains intact after staples removed, without residual oozing/bleeding.  Neurological:     Mental Status: He is  alert.  Psychiatric:        Mood and Affect: Mood normal.        Behavior: Behavior normal.       Assessment & Plan:  This visit occurred during the SARS-CoV-2 public health emergency.  Safety protocols were in place, including screening questions prior to the visit, additional usage of staff PPE, and extensive cleaning of exam room while observing appropriate contact time as indicated for disinfecting solutions.  They will return soon for AMW f/u visit with fasting labs prior.  Problem List Items Addressed This Visit    Scalp laceration, initial encounter - Primary    Healing well with staples, these were removed today. Pt tolerated well. After care handout provided. Update if any concerns.       Hypogonadism in male    Clomid was stopped - will need to monitor triglycerides (anticipate improvement off clomid). Appreciate endo care.           No orders of the defined types were placed in this encounter.  No orders of the defined types were placed in this encounter.   Follow up plan: Return if symptoms worsen or fail to improve.  Ria Bush, MD

## 2019-12-09 NOTE — Patient Instructions (Signed)
Wound Closure Removal, Care After This sheet gives you information about how to care for yourself after your stitches (sutures), staples, or skin adhesives have been removed. Your health care provider may also give you more specific instructions. If you have problems or questions, contact your health care provider. What can I expect after the procedure? After your sutures or staples have been removed or your skin adhesives have fallen off, it is common to have:  Some discomfort and swelling in the area.  Slight redness in the area. Follow these instructions at home: If you have a bandage:  Wash your hands with soap and water before you change your bandage (dressing). If soap and water are not available, use hand sanitizer.  Change your dressing as told by your health care provider. If your dressing becomes wet or dirty, or develops a bad smell, change it as soon as possible.  If your dressing sticks to your skin, pour warm, clean water over it until it loosens and can be removed without pulling apart the wound edges. Pat the area dry with a soft, clean towel. Do not rub the wound because that may cause bleeding. Wound care   Keep the wound area dry and clean.  Check your wound every day for signs of infection. Check for: ? Redness, swelling, or pain. ? Fluid or blood. ? Warmth. ? Pus or a bad smell.  Wash your hands with soap and water before and after touching your wound.  Apply cream or ointment only as told by your health care provider. If you are using cream or ointment, wash the area with soap and water 2 times a day to remove all the cream or ointment. Rinse off the soap and pat the area dry with a clean towel.  If skin glue or adhesive strips were applied after sutures or staples were removed, leave these closures in place until they peel off on their own. If adhesive strip edges start to loosen and curl up, you may trim the loose edges. Do not remove adhesive strips completely  unless your health care provider tells you to do that.  Continue to protect the wound from injury.  Do not pick at your wound. Picking can cause an infection. Bathing  Do not take baths, swim, or use a hot tub until your health care provider approves.  Ask your health care provider when it is okay to shower.  Follow these steps for showering: ? If you have a dressing, remove it before getting into the shower. ? In the shower, allow soapy water to get on the wound. Avoid scrubbing the wound. ? When you get out of the shower, dry the wound by patting it with a clean towel. ? Reapply a dressing over the wound if needed. Scar care  When your wound has completely healed, take actions to help decrease the size of your scar: ? Wear sunscreen over the scar or cover it with clothing when you are outside. New scars get sunburned easily, which can make scarring worse. ? Gently massage the scarred area. This can decrease scar thickness. General instructions  Take over-the-counter and prescription medicines only as told by your health care provider.  Keep all follow-up visits as told by your health care provider. This is important. Contact a health care provider if:  You have redness, swelling, or pain around your wound.  You have fluid or blood coming from your wound.  Your wound feels warm to the touch.  You have pus   or a bad smell coming from your wound.  Your wound opens up.  You have chills. Get help right away if:  You have a fever.  You have redness that is spreading from your wound. Summary  Change your dressing as told by your health care provider. If your dressing becomes wet or dirty, or develops a bad smell, change it as soon as possible.  Check your wound every day for signs of infection.  Wash your hands with soap and water before and after touching your wound. This information is not intended to replace advice given to you by your health care provider. Make sure  you discuss any questions you have with your health care provider. Document Revised: 05/18/2017 Document Reviewed: 03/26/2017 Elsevier Patient Education  2020 Elsevier Inc.  

## 2019-12-09 NOTE — Assessment & Plan Note (Signed)
Clomid was stopped - will need to monitor triglycerides (anticipate improvement off clomid). Appreciate endo care.

## 2020-01-01 DIAGNOSIS — I4891 Unspecified atrial fibrillation: Secondary | ICD-10-CM | POA: Diagnosis not present

## 2020-01-01 DIAGNOSIS — I480 Paroxysmal atrial fibrillation: Secondary | ICD-10-CM | POA: Diagnosis not present

## 2020-01-01 DIAGNOSIS — Z952 Presence of prosthetic heart valve: Secondary | ICD-10-CM | POA: Diagnosis not present

## 2020-01-01 DIAGNOSIS — I421 Obstructive hypertrophic cardiomyopathy: Secondary | ICD-10-CM | POA: Diagnosis not present

## 2020-01-14 ENCOUNTER — Other Ambulatory Visit: Payer: Self-pay

## 2020-01-14 ENCOUNTER — Other Ambulatory Visit: Payer: Self-pay | Admitting: Family Medicine

## 2020-01-14 ENCOUNTER — Other Ambulatory Visit (INDEPENDENT_AMBULATORY_CARE_PROVIDER_SITE_OTHER): Payer: Medicare Other

## 2020-01-14 DIAGNOSIS — Z7901 Long term (current) use of anticoagulants: Secondary | ICD-10-CM

## 2020-01-14 DIAGNOSIS — Z125 Encounter for screening for malignant neoplasm of prostate: Secondary | ICD-10-CM

## 2020-01-14 DIAGNOSIS — E785 Hyperlipidemia, unspecified: Secondary | ICD-10-CM | POA: Diagnosis not present

## 2020-01-14 DIAGNOSIS — E559 Vitamin D deficiency, unspecified: Secondary | ICD-10-CM

## 2020-01-14 DIAGNOSIS — I10 Essential (primary) hypertension: Secondary | ICD-10-CM | POA: Diagnosis not present

## 2020-01-14 LAB — COMPREHENSIVE METABOLIC PANEL
ALT: 29 U/L (ref 0–53)
AST: 28 U/L (ref 0–37)
Albumin: 4.2 g/dL (ref 3.5–5.2)
Alkaline Phosphatase: 50 U/L (ref 39–117)
BUN: 22 mg/dL (ref 6–23)
CO2: 30 mEq/L (ref 19–32)
Calcium: 9.2 mg/dL (ref 8.4–10.5)
Chloride: 104 mEq/L (ref 96–112)
Creatinine, Ser: 1.21 mg/dL (ref 0.40–1.50)
GFR: 60.98 mL/min (ref >60.00)
Glucose, Bld: 104 mg/dL — ABNORMAL HIGH (ref 70–99)
Potassium: 4.4 mEq/L (ref 3.5–5.1)
Sodium: 138 mEq/L (ref 135–145)
Total Bilirubin: 0.5 mg/dL (ref 0.2–1.2)
Total Protein: 6.7 g/dL (ref 6.0–8.3)

## 2020-01-14 LAB — CBC WITH DIFFERENTIAL/PLATELET
Basophils Absolute: 0.1 10*3/uL (ref 0.0–0.1)
Basophils Relative: 0.8 % (ref 0.0–3.0)
Eosinophils Absolute: 0.2 10*3/uL (ref 0.0–0.7)
Eosinophils Relative: 2.6 % (ref 0.0–5.0)
HCT: 45.1 % (ref 39.0–52.0)
Hemoglobin: 15.3 g/dL (ref 13.0–17.0)
Lymphocytes Relative: 42.3 % (ref 12.0–46.0)
Lymphs Abs: 3 10*3/uL (ref 0.7–4.0)
MCHC: 33.9 g/dL (ref 30.0–36.0)
MCV: 92.7 fl (ref 78.0–100.0)
Monocytes Absolute: 0.8 10*3/uL (ref 0.1–1.0)
Monocytes Relative: 11.5 % (ref 3.0–12.0)
Neutro Abs: 3 10*3/uL (ref 1.4–7.7)
Neutrophils Relative %: 42.8 % — ABNORMAL LOW (ref 43.0–77.0)
Platelets: 232 10*3/uL (ref 150.0–400.0)
RBC: 4.87 Mil/uL (ref 4.22–5.81)
RDW: 13.8 % (ref 11.5–15.5)
WBC: 7.1 10*3/uL (ref 4.0–10.5)

## 2020-01-14 LAB — LIPID PANEL
Cholesterol: 197 mg/dL (ref 0–200)
HDL: 27 mg/dL — ABNORMAL LOW (ref >39.00)
Total CHOL/HDL Ratio: 7
Triglycerides: 592 mg/dL — ABNORMAL HIGH (ref 0.0–149.0)

## 2020-01-14 LAB — LDL CHOLESTEROL, DIRECT: Direct LDL: 96 mg/dL

## 2020-01-14 LAB — MICROALBUMIN / CREATININE URINE RATIO
Creatinine,U: 107.5 mg/dL
Microalb Creat Ratio: 0.7 mg/g (ref 0.0–30.0)
Microalb, Ur: <0.7 mg/dL (ref 0.0–1.9)

## 2020-01-14 LAB — PSA, MEDICARE: PSA: 1.05 ng/ml (ref 0.10–4.00)

## 2020-01-15 DIAGNOSIS — I480 Paroxysmal atrial fibrillation: Secondary | ICD-10-CM | POA: Diagnosis not present

## 2020-01-21 ENCOUNTER — Encounter: Payer: Self-pay | Admitting: Family Medicine

## 2020-01-21 ENCOUNTER — Other Ambulatory Visit: Payer: Self-pay

## 2020-01-21 ENCOUNTER — Ambulatory Visit (INDEPENDENT_AMBULATORY_CARE_PROVIDER_SITE_OTHER): Payer: Medicare Other | Admitting: Family Medicine

## 2020-01-21 VITALS — BP 128/78 | HR 79 | Temp 97.7°F | Ht 65.5 in | Wt 215.0 lb

## 2020-01-21 DIAGNOSIS — E785 Hyperlipidemia, unspecified: Secondary | ICD-10-CM

## 2020-01-21 DIAGNOSIS — I10 Essential (primary) hypertension: Secondary | ICD-10-CM | POA: Diagnosis not present

## 2020-01-21 DIAGNOSIS — Z952 Presence of prosthetic heart valve: Secondary | ICD-10-CM

## 2020-01-21 DIAGNOSIS — G4733 Obstructive sleep apnea (adult) (pediatric): Secondary | ICD-10-CM

## 2020-01-21 DIAGNOSIS — I421 Obstructive hypertrophic cardiomyopathy: Secondary | ICD-10-CM

## 2020-01-21 DIAGNOSIS — Z7189 Other specified counseling: Secondary | ICD-10-CM | POA: Diagnosis not present

## 2020-01-21 DIAGNOSIS — Z6835 Body mass index (BMI) 35.0-35.9, adult: Secondary | ICD-10-CM

## 2020-01-21 DIAGNOSIS — Z7901 Long term (current) use of anticoagulants: Secondary | ICD-10-CM | POA: Diagnosis not present

## 2020-01-21 DIAGNOSIS — H8102 Meniere's disease, left ear: Secondary | ICD-10-CM | POA: Diagnosis not present

## 2020-01-21 DIAGNOSIS — Z86010 Personal history of colonic polyps: Secondary | ICD-10-CM

## 2020-01-21 DIAGNOSIS — I69319 Unspecified symptoms and signs involving cognitive functions following cerebral infarction: Secondary | ICD-10-CM

## 2020-01-21 DIAGNOSIS — F331 Major depressive disorder, recurrent, moderate: Secondary | ICD-10-CM

## 2020-01-21 DIAGNOSIS — E291 Testicular hypofunction: Secondary | ICD-10-CM | POA: Diagnosis not present

## 2020-01-21 DIAGNOSIS — E781 Pure hyperglyceridemia: Secondary | ICD-10-CM

## 2020-01-21 DIAGNOSIS — I6930 Unspecified sequelae of cerebral infarction: Secondary | ICD-10-CM | POA: Diagnosis not present

## 2020-01-21 DIAGNOSIS — R42 Dizziness and giddiness: Secondary | ICD-10-CM

## 2020-01-21 MED ORDER — FENOFIBRATE 145 MG PO TABS: 145.0000 mg | ORAL_TABLET | Freq: Every day | ORAL | 11 refills | Status: DC

## 2020-01-21 NOTE — Assessment & Plan Note (Signed)
Encouraged healthy diet and lifestyle choices to affect sustainable weight loss.  ?

## 2020-01-21 NOTE — Assessment & Plan Note (Signed)
Continues auto CPAP

## 2020-01-21 NOTE — Assessment & Plan Note (Signed)
Marked elevation noted despite being off clomid.  Reviewed reasons to treat high trig including increased pancreatitis risk. Will start fenofibrate - they will check with coumadin clinic re-interactions. Discussed dietary choices to improve cholesterol - diet remains somewhat limited due to coumadin use.

## 2020-01-21 NOTE — Assessment & Plan Note (Signed)
Will touch base with GI for f/u plan.

## 2020-01-21 NOTE — Assessment & Plan Note (Signed)
H/o this, has seen Dr Sonny Dandy ENT

## 2020-01-21 NOTE — Assessment & Plan Note (Signed)
Regularly sees coumadin clinic through cardiology.

## 2020-01-21 NOTE — Assessment & Plan Note (Signed)
Recent worsening flares that do sound more BPPV related - rec continue epley maneuvers, trial meclizine PRN (OTC) and return to ENT if persistent symptoms.

## 2020-01-21 NOTE — Assessment & Plan Note (Addendum)
Chronic, LDL stable off statin. No signs of CAD on previous cardiac testing, reasonable to stay off statin.  The ASCVD Risk score Dylan Bussing DC Jr., et al., 2013) failed to calculate for the following reasons:   The patient has a prior MI or stroke diagnosis

## 2020-01-21 NOTE — Progress Notes (Signed)
This visit was conducted in person.  BP 128/78 (BP Location: Left Arm, Patient Position: Sitting, Cuff Size: Large)   Pulse 79   Temp 97.7 F (36.5 C) (Temporal)   Ht 5' 5.5" (1.664 m)   Wt 215 lb (97.5 kg)   SpO2 98%   BMI 35.23 kg/m    CC: AMW f/u visit  Subjective:    Patient ID: Dylan Fletcher, male    DOB: January 09, 1961, 59 y.o.   MRN: [id number]  HPI: Dylan Fletcher is a 59 y.o. male presenting on 01/21/2020 for Annual Exam (Prt 2.  Pt accompanied by wife, Dylan Fletcher- temp 97.7.)   Saw health advisor 10/2019 for medicare wellness visit. Note reviewed.    No exam data present    Clinical Support from 11/10/2019 in [place] at [facility] Total Score 0      Fall Risk  11/10/2019 06/24/2018 12/18/2014  Falls in the past year? 0 1 No  Number falls in past yr: 0 0 -  Injury with Fall? 0 0 -  Risk for fall due to : Mental status change - -  Follow up Falls evaluation completed;Falls prevention discussed - -   Clomid stopped several months ago - this previously caused marked triglyceride elevation.   Notes ongoing dizziness worse when lays down at night, described as vertigo - epley maneuver will sometimes help. Known h/o meniere's managed with low sodium diet, followed by ENT Dr Richardson Landry. Doesn't feel this is consistent with meniere's flares but rather BPPV related.   OSA continues auto CPAP use.   H/o L thalamic stroke 09/2013 after MAZE myomectomy and MVR for HOCM and afib.  Preventative: COLONOSCOPY Date: 04/2014 mild diverticulosis, no polyps, rec rpt 5 yrs [facility])- planning to schedule [facility] f/u - told may not need coumadin bridging  Prostate cancer screening - continue yearly PSA with intermittent DRE Fluyearly  Tdap 01/2013, 11/2019 Pneumovax2016  COVID vaccine - moderna 06/2019, 09/2019  Shingrix - discussed, will check at local pharmacy.  Advanced directive planning: discussed, does not have set up. Would want wife to be HCPOA. packet previously  provided.  Seat belt use discussed  Sunscreen use discussed. No changing moles on skin. Wants spot checked on forehead. Upcoming derm appt 04/2020.  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 Interim medical history since our last visit reviewed. Allergies and medications reviewed and updated. Outpatient Medications Prior to Visit  Medication Sig Dispense Refill  . acetaminophen (TYLENOL) 500 MG tablet Take 1 tablet (500 mg total) by mouth 2 (two) times daily.    Marland Kitchen acetaminophen-codeine (TYLENOL #3) 300-30 MG tablet Take 1 tablet by mouth 3 (three) times daily as needed for moderate pain (headache). 15 tablet 0  . amoxicillin (AMOXIL) 500 MG tablet Take 500 mg by mouth as directed. 1 BID; start 24 hours prior to dental procedure and 24 hours post dental procedure    . amphetamine-dextroamphetamine (ADDERALL XR) 30 MG 24 hr capsule Take 1 capsule by mouth daily. In the morning    . amphetamine-dextroamphetamine (ADDERALL) 30 MG tablet Take 30 mg by mouth daily. Takes in the afternoon  0  . buPROPion (WELLBUTRIN XL) 150 MG 24 hr tablet Take 1 tablet by mouth daily.    . cetirizine (ZYRTEC) 10 MG tablet Take 10 mg by mouth daily as needed.     . Cholecalciferol (VITAMIN D) 2000 UNITS CAPS Take 1 capsule by mouth daily.    . famotidine (PEPCID) 20 MG tablet Take 20 mg by mouth daily.    . furosemide (LASIX) 20 MG tablet Take 1 tablet (20 mg total) by mouth daily. With extra prn weight gain    . Glucosamine Sulfate 500 MG CAPS Take 1 capsule by mouth daily.     Marland Kitchen liothyronine (CYTOMEL) 5 MCG tablet Take 2 tablets by mouth daily.    .  metoprolol succinate (TOPROL-XL) 25 MG 24 hr tablet Take 25 mg by mouth daily. Takes 2 tablets for excessive palpitations    . Multiple Vitamin (MULTIVITAMIN) tablet Take 1 tablet by mouth daily.    Marland Kitchen oxyCODONE-acetaminophen (PERCOCET/ROXICET) 5-325 MG per tablet Take 1 tablet by mouth every 4 (four) hours as needed for severe pain.    . sildenafil (VIAGRA) 100 MG tablet Take 1 tablet (100 mg total) by mouth daily as needed for erectile dysfunction. 10 tablet 3  . warfarin (COUMADIN) 5 MG tablet Take 5 mg by mouth as directed.      No facility-administered medications prior to visit.     Per HPI unless specifically indicated in ROS section below Review of Systems Objective:  BP 128/78 (BP Location: Left Arm, Patient Position: Sitting, Cuff Size: Large)   Pulse 79   Temp 97.7 F (36.5 C) (Temporal)   Ht 5' 5.5" (1.664 m)   Wt 215 lb (97.5 kg)   SpO2 98%   BMI 35.23 kg/m   Wt Readings from Last 3 Encounters:  01/21/20 215 lb (97.5 kg)  12/09/19 216 lb 2 oz (98 kg)  06/06/19 217 lb 2 oz (98.5 kg)      Physical Exam Vitals and nursing note reviewed.  Constitutional:      General: He is not in acute distress.    Appearance: Normal appearance. He is well-developed. He is obese. He is not ill-appearing.  HENT:     Head: Normocephalic and atraumatic.     Right Ear: Hearing, tympanic membrane, ear canal and external ear normal.     Left Ear: Hearing, tympanic membrane, ear canal and external ear normal.  Eyes:     General: No scleral icterus.    Extraocular Movements: Extraocular movements intact.     Conjunctiva/sclera: Conjunctivae normal.     Pupils: Pupils are equal, round, and reactive to light.  Cardiovascular:     Rate and Rhythm: Normal rate and regular rhythm.     Pulses: Normal pulses.          Radial pulses are 2+ on the right side and 2+ on the left side.     Heart sounds: Normal heart sounds. No murmur heard.   Pulmonary:     Effort: Pulmonary effort is normal. No  respiratory distress.     Breath sounds: Normal breath sounds. No wheezing, rhonchi or rales.  Abdominal:     General: Bowel sounds are normal. There is no distension.     Palpations: Abdomen is soft. There is no mass.     Tenderness: There is no abdominal tenderness. There is no guarding or rebound.     Hernia: No hernia is present.  Musculoskeletal:        General: Normal range of  motion.     Cervical back: Normal range of motion and neck supple.     Right lower leg: No edema.     Left lower leg: No edema.  Lymphadenopathy:     Cervical: No cervical adenopathy.  Skin:    General: Skin is warm and dry.     Findings: No rash.  Neurological:     General: No focal deficit present.     Mental Status: He is alert and oriented to person, place, and time.     Comments: CN grossly intact, station and gait intact  Psychiatric:        Mood and Affect: Mood normal.        Behavior: Behavior normal.        Thought Content: Thought content normal.        Judgment: Judgment normal.       Results for orders placed or performed in visit on 01/14/20  CBC with Differential/Platelet  Result Value Ref Range   WBC 7.1 4.0 - 10.5 K/uL   RBC 4.87 4.22 - 5.81 Mil/uL   Hemoglobin 15.3 13.0 - 17.0 g/dL   HCT 45.1 39 - 52 %   MCV 92.7 78.0 - 100.0 fl   MCHC 33.9 30.0 - 36.0 g/dL   RDW 13.8 11.5 - 15.5 %   Platelets 232.0 150 - 400 K/uL   Neutrophils Relative % 42.8 (L) 43 - 77 %   Lymphocytes Relative 42.3 12 - 46 %   Monocytes Relative 11.5 3 - 12 %   Eosinophils Relative 2.6 0 - 5 %   Basophils Relative 0.8 0 - 3 %   Neutro Abs 3.0 1.4 - 7.7 K/uL   Lymphs Abs 3.0 0.7 - 4.0 K/uL   Monocytes Absolute 0.8 0 - 1 K/uL   Eosinophils Absolute 0.2 0 - 0 K/uL   Basophils Absolute 0.1 0 - 0 K/uL  Microalbumin / creatinine urine ratio  Result Value Ref Range   Microalb, Ur <0.7 0.0 - 1.9 mg/dL   Creatinine,U 107.5 mg/dL   Microalb Creat Ratio 0.7 0.0 - 30.0 mg/g  PSA, Medicare  Result Value Ref  Range   PSA 1.05 0.10 - 4.00 ng/ml  Comprehensive metabolic panel  Result Value Ref Range   Sodium 138 135 - 145 mEq/L   Potassium 4.4 3.5 - 5.1 mEq/L   Chloride 104 96 - 112 mEq/L   CO2 30 19 - 32 mEq/L   Glucose, Bld 104 (H) 70 - 99 mg/dL   BUN 22 6 - 23 mg/dL   Creatinine, Ser 1.21 0.40 - 1.50 mg/dL   Total Bilirubin 0.5 0.2 - 1.2 mg/dL   Alkaline Phosphatase 50 39 - 117 U/L   AST 28 0 - 37 U/L   ALT 29 0 - 53 U/L   Total Protein 6.7 6.0 - 8.3 g/dL   Albumin 4.2 3.5 - 5.2 g/dL   GFR 60.98 >60.00 mL/min   Calcium 9.2 8.4 - 10.5 mg/dL  Lipid panel  Result Value Ref Range   Cholesterol 197 0 - 200 mg/dL   Triglycerides (H) 0 - 149 mg/dL    592.0 Triglyceride is over 400; calculations on Lipids are invalid.   HDL 27.00 (L) >39.00 mg/dL   Total CHOL/HDL Ratio 7   LDL cholesterol, direct  Result Value Ref Range   Direct LDL 96.0 mg/dL   Assessment & Plan:  This visit occurred during the SARS-CoV-2 public health emergency.  Safety protocols were in place, including screening  questions prior to the visit, additional usage of staff PPE, and extensive cleaning of exam room while observing appropriate contact time as indicated for disinfecting solutions.   Problem List Items Addressed This Visit    Warfarin anticoagulation    Regularly sees coumadin clinic through cardiology.       Vertigo    Recent worsening flares that do sound more BPPV related - rec continue epley maneuvers, trial meclizine PRN (OTC) and return to ENT if persistent symptoms.       Severe obesity (BMI 35.0-35.9 with comorbidity) ([place])    Encouraged healthy diet and lifestyle choices to affect sustainable weight loss.       OSA (obstructive sleep apnea)    Continues auto CPAP      Meniere disease, left    H/o this, has seen Dr Sonny Dandy ENT      MDD (major depressive disorder), recurrent episode, moderate ([place])    Stable period followed by psych on wellbutrin, cytomel and adderall.        Hypogonadism in male    Now off clomid for last few months.       Hypertrophic obstructive cardiomyopathy ([place])    S/p MAZE myomectomy  Regularly followed by cardiology.      Relevant Medications   fenofibrate (TRICOR) 145 MG tablet   Hypertriglyceridemia    Marked elevation noted despite being off clomid.  Reviewed reasons to treat high trig including increased pancreatitis risk. Will start fenofibrate - they will check with coumadin clinic re-interactions. Discussed dietary choices to improve cholesterol - diet remains somewhat limited due to coumadin use.       Relevant Medications   fenofibrate (TRICOR) 145 MG tablet   HTN (hypertension)    Chronic, stable on current regimen.       Relevant Medications   fenofibrate (TRICOR) 145 MG tablet   HLD (hyperlipidemia)    Chronic, LDL stable off statin. No signs of CAD on previous cardiac testing, reasonable to stay off statin.  The ASCVD Risk score Mikey Bussing DC Jr., et al., 2013) failed to calculate for the following reasons:   The patient has a prior MI or stroke diagnosis       Relevant Medications   fenofibrate (TRICOR) 145 MG tablet   History of cerebrovascular accident (CVA) with residual deficit   History of adenomatous polyp of colon    Will touch base with GI for f/u plan.       H/O mitral valve replacement   Cognitive deficit as late effect of cerebrovascular accident (CVA)   Advanced directives, counseling/discussion - Primary    Advanced directive planning: discussed, does not have set up. Would want wife to be HCPOA. packet previously provided.           Meds ordered this encounter  Medications  . fenofibrate (TRICOR) 145 MG tablet    Sig: Take 1 tablet (145 mg total) by mouth daily.    Dispense:  30 tablet    Refill:  11   No orders of the defined types were placed in this encounter.   Patient instructions: If interested, check with pharmacy about new 2 shot shingles series (shingrix).  Schedule eye  exam.  Work on advanced directive at home, bring [place] a copy.  Start fenofibrate to help lower cholesterol levels. Let us know if any trouble tolerating this.  Trial meclizine as needed for vertigo. If ongoing or worsening, follow up with ENT.  Return as needed or in 6 months for follow  up visit.   Follow up plan: Return in about 6 months (around 07/23/2020) for follow up visit.  Ria Bush, MD

## 2020-01-21 NOTE — Assessment & Plan Note (Addendum)
Stable period followed by psych on wellbutrin, cytomel and adderall.

## 2020-01-21 NOTE — Patient Instructions (Addendum)
If interested, check with pharmacy about new 2 shot shingles series (shingrix).  Schedule eye exam.  Work on advanced directive at home, bring [place] a copy.  Start fenofibrate to help lower cholesterol levels. Let us know if any trouble tolerating this.  Trial meclizine as needed for vertigo. If ongoing or worsening, follow up with ENT.  Return as needed or in 6 months for follow up visit.   Health Maintenance, Male Adopting a healthy lifestyle and getting preventive care are important in promoting health and wellness. Ask your health care provider about:  The right schedule for you to have regular tests and exams.  Things you can do on your own to prevent diseases and keep yourself healthy. What should I know about diet, weight, and exercise? Eat a healthy diet   Eat a diet that includes plenty of vegetables, fruits, low-fat dairy products, and lean protein.  Do not eat a lot of foods that are high in solid fats, added sugars, or sodium. Maintain a healthy weight Body mass index (BMI) is a measurement that can be used to identify possible weight problems. It estimates body fat based on height and weight. Your health care provider can help determine your BMI and help you achieve or maintain a healthy weight. Get regular exercise Get regular exercise. This is one of the most important things you can do for your health. Most adults should:  Exercise for at least 150 minutes each week. The exercise should increase your heart rate and make you sweat (moderate-intensity exercise).  Do strengthening exercises at least twice a week. This is in addition to the moderate-intensity exercise.  Spend less time sitting. Even light physical activity can be beneficial. Watch cholesterol and blood lipids Have your blood tested for lipids and cholesterol at 61 years of age, then have this test every 5 years. You may need to have your cholesterol levels checked more often if:  Your lipid or cholesterol  levels are high.  You are older than 61 years of age.  You are at high risk for heart disease. What should I know about cancer screening? Many types of cancers can be detected early and may often be prevented. Depending on your health history and family history, you may need to have cancer screening at various ages. This may include screening for:  Colorectal cancer.  Prostate cancer.  Skin cancer.  Lung cancer. What should I know about heart disease, diabetes, and high blood pressure? Blood pressure and heart disease  High blood pressure causes heart disease and increases the risk of stroke. This is more likely to develop in people who have high blood pressure readings, are of African descent, or are overweight.  Talk with your health care provider about your target blood pressure readings.  Have your blood pressure checked: ? Every 3-5 years if you are 35-65 years of age. ? Every year if you are 67 years old or older.  If you are between the ages of 57 and 64 and are a current or former smoker, ask your health care provider if you should have a one-time screening for abdominal aortic aneurysm (AAA). Diabetes Have regular diabetes screenings. This checks your fasting blood sugar level. Have the screening done:  Once every three years after age 89 if you are at a normal weight and have a low risk for diabetes.  More often and at a younger age if you are overweight or have a high risk for diabetes. What should I know about  preventing infection? Hepatitis B If you have a higher risk for hepatitis B, you should be screened for this virus. Talk with your health care provider to find out if you are at risk for hepatitis B infection. Hepatitis C Blood testing is recommended for:  Everyone born from [birth year] through 1967.  Anyone with known risk factors for hepatitis C. Sexually transmitted infections (STIs)  You should be screened each year for STIs, including gonorrhea and  chlamydia, if: ? You are sexually active and are younger than 61 years of age. ? You are older than 61 years of age and your health care provider tells you that you are at risk for this type of infection. ? Your sexual activity has changed since you were last screened, and you are at increased risk for chlamydia or gonorrhea. Ask your health care provider if you are at risk.  Ask your health care provider about whether you are at high risk for HIV. Your health care provider may recommend a prescription medicine to help prevent HIV infection. If you choose to take medicine to prevent HIV, you should first get tested for HIV. You should then be tested every 3 months for as long as you are taking the medicine. Follow these instructions at home: Lifestyle  Do not use any products that contain nicotine or tobacco, such as cigarettes, e-cigarettes, and chewing tobacco. If you need help quitting, ask your health care provider.  Do not use street drugs.  Do not share needles.  Ask your health care provider for help if you need support or information about quitting drugs. Alcohol use  Do not drink alcohol if your health care provider tells you not to drink.  If you drink alcohol: ? Limit how much you have to 0-2 drinks a day. ? Be aware of how much alcohol is in your drink. In the [place] one drink equals one 12 oz bottle of beer (355 mL), one 5 oz glass of wine (148 mL), or one 1 oz glass of hard liquor (44 mL). General instructions  Schedule regular health, dental, and eye exams.  Stay current with your vaccines.  Tell your health care provider if: ? You often feel depressed. ? You have ever been abused or do not feel safe at home. Summary  Adopting a healthy lifestyle and getting preventive care are important in promoting health and wellness.  Follow your health care provider's instructions about healthy diet, exercising, and getting tested or screened for diseases.  Follow your health  care provider's instructions on monitoring your cholesterol and blood pressure. This information is not intended to replace advice given to you by your health care provider. Make sure you discuss any questions you have with your health care provider. Document Revised: 05/29/2018 Document Reviewed: 05/29/2018 Elsevier Patient Education  2020 [employer].

## 2020-01-21 NOTE — Assessment & Plan Note (Signed)
S/p MAZE myomectomy  Regularly followed by cardiology.

## 2020-01-21 NOTE — Assessment & Plan Note (Signed)
Advanced directive planning: discussed, does not have set up. Would want wife to be HCPOA. packet previously provided.

## 2020-01-21 NOTE — Assessment & Plan Note (Signed)
Chronic, stable on current regimen.  

## 2020-01-21 NOTE — Assessment & Plan Note (Signed)
Now off clomid for last few months.

## 2020-01-22 DIAGNOSIS — I480 Paroxysmal atrial fibrillation: Secondary | ICD-10-CM | POA: Diagnosis not present

## 2020-01-28 DIAGNOSIS — E291 Testicular hypofunction: Secondary | ICD-10-CM | POA: Diagnosis not present

## 2020-01-28 DIAGNOSIS — R5383 Other fatigue: Secondary | ICD-10-CM | POA: Diagnosis not present

## 2020-02-04 DIAGNOSIS — E291 Testicular hypofunction: Secondary | ICD-10-CM | POA: Diagnosis not present

## 2020-02-04 DIAGNOSIS — R5383 Other fatigue: Secondary | ICD-10-CM | POA: Diagnosis not present

## 2020-02-12 ENCOUNTER — Encounter: Payer: Self-pay | Admitting: Family Medicine

## 2020-02-12 DIAGNOSIS — Z6834 Body mass index (BMI) 34.0-34.9, adult: Secondary | ICD-10-CM | POA: Diagnosis not present

## 2020-02-12 DIAGNOSIS — R030 Elevated blood-pressure reading, without diagnosis of hypertension: Secondary | ICD-10-CM | POA: Diagnosis not present

## 2020-02-12 DIAGNOSIS — Z952 Presence of prosthetic heart valve: Secondary | ICD-10-CM | POA: Diagnosis not present

## 2020-02-12 DIAGNOSIS — E782 Mixed hyperlipidemia: Secondary | ICD-10-CM | POA: Diagnosis not present

## 2020-02-12 DIAGNOSIS — I480 Paroxysmal atrial fibrillation: Secondary | ICD-10-CM | POA: Diagnosis not present

## 2020-02-12 DIAGNOSIS — I421 Obstructive hypertrophic cardiomyopathy: Secondary | ICD-10-CM | POA: Diagnosis not present

## 2020-02-19 DIAGNOSIS — I4891 Unspecified atrial fibrillation: Secondary | ICD-10-CM | POA: Diagnosis not present

## 2020-02-26 DIAGNOSIS — I69311 Memory deficit following cerebral infarction: Secondary | ICD-10-CM | POA: Diagnosis not present

## 2020-02-26 DIAGNOSIS — I4891 Unspecified atrial fibrillation: Secondary | ICD-10-CM | POA: Diagnosis not present

## 2020-02-26 DIAGNOSIS — Z79899 Other long term (current) drug therapy: Secondary | ICD-10-CM | POA: Diagnosis not present

## 2020-02-26 DIAGNOSIS — F411 Generalized anxiety disorder: Secondary | ICD-10-CM | POA: Diagnosis not present

## 2020-02-26 DIAGNOSIS — F900 Attention-deficit hyperactivity disorder, predominantly inattentive type: Secondary | ICD-10-CM | POA: Diagnosis not present

## 2020-03-11 DIAGNOSIS — Z952 Presence of prosthetic heart valve: Secondary | ICD-10-CM | POA: Diagnosis not present

## 2020-03-24 DIAGNOSIS — F3341 Major depressive disorder, recurrent, in partial remission: Secondary | ICD-10-CM | POA: Diagnosis not present

## 2020-03-24 DIAGNOSIS — I69311 Memory deficit following cerebral infarction: Secondary | ICD-10-CM | POA: Diagnosis not present

## 2020-03-24 DIAGNOSIS — Z79899 Other long term (current) drug therapy: Secondary | ICD-10-CM | POA: Diagnosis not present

## 2020-03-24 DIAGNOSIS — F411 Generalized anxiety disorder: Secondary | ICD-10-CM | POA: Diagnosis not present

## 2020-03-24 DIAGNOSIS — F3342 Major depressive disorder, recurrent, in full remission: Secondary | ICD-10-CM | POA: Diagnosis not present

## 2020-03-25 DIAGNOSIS — I4891 Unspecified atrial fibrillation: Secondary | ICD-10-CM | POA: Diagnosis not present

## 2020-04-01 DIAGNOSIS — I4891 Unspecified atrial fibrillation: Secondary | ICD-10-CM | POA: Diagnosis not present

## 2020-04-02 DIAGNOSIS — L821 Other seborrheic keratosis: Secondary | ICD-10-CM | POA: Diagnosis not present

## 2020-04-02 DIAGNOSIS — D229 Melanocytic nevi, unspecified: Secondary | ICD-10-CM | POA: Diagnosis not present

## 2020-04-02 DIAGNOSIS — L814 Other melanin hyperpigmentation: Secondary | ICD-10-CM | POA: Diagnosis not present

## 2020-04-04 ENCOUNTER — Other Ambulatory Visit: Payer: Self-pay | Admitting: Family Medicine

## 2020-04-05 DIAGNOSIS — I480 Paroxysmal atrial fibrillation: Secondary | ICD-10-CM | POA: Diagnosis not present

## 2020-04-06 NOTE — Telephone Encounter (Signed)
Last fill from Historical provider. Last OV 01/21/20 Please see message and advise.  Thank you.

## 2020-04-07 NOTE — Telephone Encounter (Signed)
ERx 

## 2020-04-15 DIAGNOSIS — Z952 Presence of prosthetic heart valve: Secondary | ICD-10-CM | POA: Diagnosis not present

## 2020-04-20 DIAGNOSIS — Z7901 Long term (current) use of anticoagulants: Secondary | ICD-10-CM | POA: Diagnosis not present

## 2020-04-20 DIAGNOSIS — I480 Paroxysmal atrial fibrillation: Secondary | ICD-10-CM | POA: Diagnosis not present

## 2020-04-20 DIAGNOSIS — Z79899 Other long term (current) drug therapy: Secondary | ICD-10-CM | POA: Diagnosis not present

## 2020-04-20 DIAGNOSIS — I440 Atrioventricular block, first degree: Secondary | ICD-10-CM | POA: Diagnosis not present

## 2020-04-20 DIAGNOSIS — Z6834 Body mass index (BMI) 34.0-34.9, adult: Secondary | ICD-10-CM | POA: Diagnosis not present

## 2020-04-20 DIAGNOSIS — F411 Generalized anxiety disorder: Secondary | ICD-10-CM | POA: Diagnosis not present

## 2020-04-20 DIAGNOSIS — F900 Attention-deficit hyperactivity disorder, predominantly inattentive type: Secondary | ICD-10-CM | POA: Diagnosis not present

## 2020-04-20 DIAGNOSIS — I447 Left bundle-branch block, unspecified: Secondary | ICD-10-CM | POA: Diagnosis not present

## 2020-04-20 DIAGNOSIS — I421 Obstructive hypertrophic cardiomyopathy: Secondary | ICD-10-CM | POA: Diagnosis not present

## 2020-04-20 DIAGNOSIS — F3341 Major depressive disorder, recurrent, in partial remission: Secondary | ICD-10-CM | POA: Diagnosis not present

## 2020-04-20 DIAGNOSIS — E782 Mixed hyperlipidemia: Secondary | ICD-10-CM | POA: Diagnosis not present

## 2020-04-20 DIAGNOSIS — I69311 Memory deficit following cerebral infarction: Secondary | ICD-10-CM | POA: Diagnosis not present

## 2020-04-20 DIAGNOSIS — Z952 Presence of prosthetic heart valve: Secondary | ICD-10-CM | POA: Diagnosis not present

## 2020-04-28 DIAGNOSIS — Z23 Encounter for immunization: Secondary | ICD-10-CM | POA: Diagnosis not present

## 2020-05-06 DIAGNOSIS — I4891 Unspecified atrial fibrillation: Secondary | ICD-10-CM | POA: Diagnosis not present

## 2020-05-06 DIAGNOSIS — E291 Testicular hypofunction: Secondary | ICD-10-CM | POA: Diagnosis not present

## 2020-05-07 DIAGNOSIS — I480 Paroxysmal atrial fibrillation: Secondary | ICD-10-CM | POA: Diagnosis not present

## 2020-05-07 DIAGNOSIS — Z23 Encounter for immunization: Secondary | ICD-10-CM | POA: Diagnosis not present

## 2020-05-10 DIAGNOSIS — G9340 Encephalopathy, unspecified: Secondary | ICD-10-CM | POA: Diagnosis not present

## 2020-05-10 DIAGNOSIS — I639 Cerebral infarction, unspecified: Secondary | ICD-10-CM | POA: Diagnosis not present

## 2020-05-17 DIAGNOSIS — I4891 Unspecified atrial fibrillation: Secondary | ICD-10-CM | POA: Diagnosis not present

## 2020-05-21 DIAGNOSIS — F3342 Major depressive disorder, recurrent, in full remission: Secondary | ICD-10-CM | POA: Diagnosis not present

## 2020-05-21 DIAGNOSIS — I69311 Memory deficit following cerebral infarction: Secondary | ICD-10-CM | POA: Diagnosis not present

## 2020-05-21 DIAGNOSIS — Z79899 Other long term (current) drug therapy: Secondary | ICD-10-CM | POA: Diagnosis not present

## 2020-05-21 DIAGNOSIS — F900 Attention-deficit hyperactivity disorder, predominantly inattentive type: Secondary | ICD-10-CM | POA: Diagnosis not present

## 2020-05-21 DIAGNOSIS — F411 Generalized anxiety disorder: Secondary | ICD-10-CM | POA: Diagnosis not present

## 2020-05-25 DIAGNOSIS — I4891 Unspecified atrial fibrillation: Secondary | ICD-10-CM | POA: Diagnosis not present

## 2020-05-25 DIAGNOSIS — Z952 Presence of prosthetic heart valve: Secondary | ICD-10-CM | POA: Diagnosis not present

## 2020-06-01 DIAGNOSIS — I4891 Unspecified atrial fibrillation: Secondary | ICD-10-CM | POA: Diagnosis not present

## 2020-06-16 DIAGNOSIS — I4891 Unspecified atrial fibrillation: Secondary | ICD-10-CM | POA: Diagnosis not present

## 2020-06-24 DIAGNOSIS — Z952 Presence of prosthetic heart valve: Secondary | ICD-10-CM | POA: Diagnosis not present

## 2020-06-24 DIAGNOSIS — F411 Generalized anxiety disorder: Secondary | ICD-10-CM | POA: Diagnosis not present

## 2020-06-24 DIAGNOSIS — F3341 Major depressive disorder, recurrent, in partial remission: Secondary | ICD-10-CM | POA: Diagnosis not present

## 2020-06-24 DIAGNOSIS — Z79899 Other long term (current) drug therapy: Secondary | ICD-10-CM | POA: Diagnosis not present

## 2020-06-24 DIAGNOSIS — I69311 Memory deficit following cerebral infarction: Secondary | ICD-10-CM | POA: Diagnosis not present

## 2020-06-24 DIAGNOSIS — F900 Attention-deficit hyperactivity disorder, predominantly inattentive type: Secondary | ICD-10-CM | POA: Diagnosis not present

## 2020-07-15 ENCOUNTER — Other Ambulatory Visit: Payer: Self-pay | Admitting: Family Medicine

## 2020-07-15 DIAGNOSIS — E559 Vitamin D deficiency, unspecified: Secondary | ICD-10-CM

## 2020-07-15 DIAGNOSIS — Z125 Encounter for screening for malignant neoplasm of prostate: Secondary | ICD-10-CM

## 2020-07-15 DIAGNOSIS — E785 Hyperlipidemia, unspecified: Secondary | ICD-10-CM

## 2020-07-15 DIAGNOSIS — I10 Essential (primary) hypertension: Secondary | ICD-10-CM

## 2020-07-15 DIAGNOSIS — Z7901 Long term (current) use of anticoagulants: Secondary | ICD-10-CM

## 2020-07-16 ENCOUNTER — Other Ambulatory Visit: Payer: Self-pay

## 2020-07-16 ENCOUNTER — Other Ambulatory Visit (INDEPENDENT_AMBULATORY_CARE_PROVIDER_SITE_OTHER): Payer: Medicare Other

## 2020-07-16 DIAGNOSIS — Z125 Encounter for screening for malignant neoplasm of prostate: Secondary | ICD-10-CM

## 2020-07-16 DIAGNOSIS — E785 Hyperlipidemia, unspecified: Secondary | ICD-10-CM

## 2020-07-16 DIAGNOSIS — E559 Vitamin D deficiency, unspecified: Secondary | ICD-10-CM

## 2020-07-16 DIAGNOSIS — Z7901 Long term (current) use of anticoagulants: Secondary | ICD-10-CM

## 2020-07-16 LAB — COMPREHENSIVE METABOLIC PANEL
ALT: 29 U/L (ref 0–53)
AST: 32 U/L (ref 0–37)
Albumin: 4.4 g/dL (ref 3.5–5.2)
Alkaline Phosphatase: 33 U/L — ABNORMAL LOW (ref 39–117)
BUN: 24 mg/dL — ABNORMAL HIGH (ref 6–23)
CO2: 30 mEq/L (ref 19–32)
Calcium: 9.6 mg/dL (ref 8.4–10.5)
Chloride: 105 mEq/L (ref 96–112)
Creatinine, Ser: 1.34 mg/dL (ref 0.40–1.50)
GFR: 57.21 mL/min — ABNORMAL LOW (ref >60.00)
Glucose, Bld: 93 mg/dL (ref 70–99)
Potassium: 4.5 mEq/L (ref 3.5–5.1)
Sodium: 141 mEq/L (ref 135–145)
Total Bilirubin: 0.5 mg/dL (ref 0.2–1.2)
Total Protein: 6.7 g/dL (ref 6.0–8.3)

## 2020-07-16 LAB — CBC WITH DIFFERENTIAL/PLATELET
Basophils Absolute: 0.1 10*3/uL (ref 0.0–0.1)
Basophils Relative: 0.5 % (ref 0.0–3.0)
Eosinophils Absolute: 0.2 10*3/uL (ref 0.0–0.7)
Eosinophils Relative: 2.3 % (ref 0.0–5.0)
HCT: 43 % (ref 39.0–52.0)
Hemoglobin: 14.7 g/dL (ref 13.0–17.0)
Lymphocytes Relative: 35.1 % (ref 12.0–46.0)
Lymphs Abs: 3.4 10*3/uL (ref 0.7–4.0)
MCHC: 34.1 g/dL (ref 30.0–36.0)
MCV: 92.1 fl (ref 78.0–100.0)
Monocytes Absolute: 1 10*3/uL (ref 0.1–1.0)
Monocytes Relative: 9.9 % (ref 3.0–12.0)
Neutro Abs: 5 10*3/uL (ref 1.4–7.7)
Neutrophils Relative %: 52.2 % (ref 43.0–77.0)
Platelets: 273 10*3/uL (ref 150.0–400.0)
RBC: 4.68 Mil/uL (ref 4.22–5.81)
RDW: 13.4 % (ref 11.5–15.5)
WBC: 9.7 10*3/uL (ref 4.0–10.5)

## 2020-07-16 LAB — VITAMIN D 25 HYDROXY (VIT D DEFICIENCY, FRACTURES): VITD: 54.99 ng/mL (ref 30.00–100.00)

## 2020-07-16 LAB — LIPID PANEL
Cholesterol: 148 mg/dL (ref 0–200)
HDL: 40.9 mg/dL (ref >39.00)
LDL Cholesterol: 83 mg/dL (ref 0–99)
NonHDL: 106.71
Total CHOL/HDL Ratio: 4
Triglycerides: 119 mg/dL (ref 0.0–149.0)
VLDL: 23.8 mg/dL (ref 0.0–40.0)

## 2020-07-16 LAB — PSA, MEDICARE: PSA: 0.63 ng/ml (ref 0.10–4.00)

## 2020-07-21 ENCOUNTER — Other Ambulatory Visit: Payer: Self-pay

## 2020-07-21 ENCOUNTER — Encounter: Payer: Self-pay | Admitting: Family Medicine

## 2020-07-21 ENCOUNTER — Ambulatory Visit (INDEPENDENT_AMBULATORY_CARE_PROVIDER_SITE_OTHER): Payer: Medicare Other | Admitting: Family Medicine

## 2020-07-21 VITALS — BP 122/74 | HR 70 | Temp 97.5°F | Ht 65.5 in | Wt 210.1 lb

## 2020-07-21 DIAGNOSIS — E781 Pure hyperglyceridemia: Secondary | ICD-10-CM

## 2020-07-21 DIAGNOSIS — I421 Obstructive hypertrophic cardiomyopathy: Secondary | ICD-10-CM

## 2020-07-21 DIAGNOSIS — E669 Obesity, unspecified: Secondary | ICD-10-CM

## 2020-07-21 DIAGNOSIS — F331 Major depressive disorder, recurrent, moderate: Secondary | ICD-10-CM

## 2020-07-21 DIAGNOSIS — Z7901 Long term (current) use of anticoagulants: Secondary | ICD-10-CM | POA: Diagnosis not present

## 2020-07-21 DIAGNOSIS — E785 Hyperlipidemia, unspecified: Secondary | ICD-10-CM

## 2020-07-21 DIAGNOSIS — E559 Vitamin D deficiency, unspecified: Secondary | ICD-10-CM | POA: Diagnosis not present

## 2020-07-21 MED ORDER — GLUCOSAMINE-VITAMIN D 1000-200 MG-UNIT PO TABS: 2.0000 | ORAL_TABLET | Freq: Every day | ORAL | Status: AC

## 2020-07-21 NOTE — Patient Instructions (Addendum)
You are doing well today Return as needed or in 6 months for wellness visit. Try to increase water by 1-2 glasses a day.

## 2020-07-21 NOTE — Progress Notes (Signed)
Patient ID: Dylan Fletcher, male    DOB: Mar 04, 1961, 60 y.o.   MRN: [id number]  This visit was conducted in person.  BP 122/74 (BP Location: Right Arm, Patient Position: Sitting, Cuff Size: Large)   Pulse 70   Temp (!) 97.5 F (36.4 C) (Temporal)   Ht 5' 5.5" (1.664 m)   Wt 210 lb 1 oz (95.3 kg)   SpO2 98%   BMI 34.42 kg/m    CC: AMW - converted to acute visit as too soon for AMW Subjective:   HPI: Dylan Fletcher is a 60 y.o. male presenting on 07/21/2020 for Medicare Wellness (Pt accompanied by wife, [name]- temp 97.6.)   Actually early for AMW (done 10/2019).   Sister in law planning to move in with them from [place].  Since last seen here, has seen cardiology, neurology, endocrinology, psychiatry.  Lipid clinic/coumadin clinic started rosuvastatin - in addition to fenofibrate.  Testosterone levels normal - rec against treating at this time.  Continues sodium restricted diet of <1gm/day - feels ill if goes over this.   Has f/u appt tomorrow with psychiatry.      Relevant past medical, surgical, family and social history reviewed and updated as indicated. Interim medical history since our last visit reviewed. Allergies and medications reviewed and updated. Outpatient Medications Prior to Visit  Medication Sig Dispense Refill  . acetaminophen (TYLENOL) 500 MG tablet Take 1 tablet (500 mg total) by mouth 2 (two) times daily.    . Acetaminophen-Codeine 300-30 MG tablet TAKE 1 TABLET BY MOUTH 3 (THREE) TIMES DAILY AS NEEDED FOR MODERATE PAIN (HEADACHE). 15 tablet 0  . amoxicillin (AMOXIL) 500 MG tablet Take 500 mg by mouth as directed. 1 BID; start 24 hours prior to dental procedure and 24 hours post dental procedure    . amphetamine-dextroamphetamine (ADDERALL XR) 20 MG 24 hr capsule Take 20 mg by mouth daily. Takes 2 tablets every morning    . amphetamine-dextroamphetamine (ADDERALL) 30 MG tablet Take 30 mg by mouth daily. Takes in the afternoon  0  . buPROPion  (WELLBUTRIN XL) 150 MG 24 hr tablet Take 1 tablet by mouth daily.    . cetirizine (ZYRTEC) 10 MG tablet Take 10 mg by mouth daily as needed.     . famotidine (PEPCID) 20 MG tablet Take 20 mg by mouth daily.    . fenofibrate (TRICOR) 145 MG tablet Take 1 tablet (145 mg total) by mouth daily. 30 tablet 11  . furosemide (LASIX) 20 MG tablet Take 1 tablet (20 mg total) by mouth daily. With extra prn weight gain    . liothyronine (CYTOMEL) 5 MCG tablet Take 2 tablets by mouth daily.    . metoprolol succinate (TOPROL-XL) 25 MG 24 hr tablet Take 25 mg by mouth daily. Takes 2 tablets for excessive palpitations    . Multiple Vitamin (MULTIVITAMIN) tablet Take 1 tablet by mouth daily.    Marland Kitchen oxyCODONE-acetaminophen (PERCOCET/ROXICET) 5-325 MG per tablet Take 1 tablet by mouth every 4 (four) hours as needed for severe pain.    . rosuvastatin (CRESTOR) 10 MG tablet Take 10 mg by mouth at bedtime.    . sertraline (ZOLOFT) 50 MG tablet Take 50 mg by mouth daily.    . sildenafil (VIAGRA) 100 MG tablet Take 1 tablet (100 mg total) by mouth daily as needed for erectile dysfunction. 10 tablet 3  . warfarin (COUMADIN) 5 MG tablet Take 5 mg by mouth as directed.     . [place]-Glucosamine-Vit D (  GLUCOSAMINE-VITAMIN D3 PO) Take by mouth daily. 2000 mg-2000 IU    . amphetamine-dextroamphetamine (ADDERALL XR) 30 MG 24 hr capsule Take 1 capsule by mouth daily. In the morning    . Cholecalciferol (VITAMIN D) 2000 UNITS CAPS Take 1 capsule by mouth daily.    . Glucosamine Sulfate 500 MG CAPS Take 1 capsule by mouth daily.      No facility-administered medications prior to visit.     Per HPI unless specifically indicated in ROS section below Review of Systems Objective:  BP 122/74 (BP Location: Right Arm, Patient Position: Sitting, Cuff Size: Large)   Pulse 70   Temp (!) 97.5 F (36.4 C) (Temporal)   Ht 5' 5.5" (1.664 m)   Wt 210 lb 1 oz (95.3 kg)   SpO2 98%   BMI 34.42 kg/m   Wt Readings from Last 3  Encounters:  07/21/20 210 lb 1 oz (95.3 kg)  01/21/20 215 lb (97.5 kg)  12/09/19 216 lb 2 oz (98 kg)      Physical Exam Vitals and nursing note reviewed.  Constitutional:      Appearance: Normal appearance. He is obese. He is not ill-appearing.  Cardiovascular:     Rate and Rhythm: Normal rate and regular rhythm.     Pulses: Normal pulses.     Heart sounds: Murmur (mild systolic, click) heard.    Pulmonary:     Effort: Pulmonary effort is normal. No respiratory distress.     Breath sounds: Normal breath sounds. No wheezing, rhonchi or rales.  Musculoskeletal:     Right lower leg: No edema.     Left lower leg: No edema.  Skin:    General: Skin is warm and dry.     Findings: No rash.  Neurological:     Mental Status: He is alert.  Psychiatric:        Mood and Affect: Mood normal.        Behavior: Behavior normal.       Results for orders placed or performed in visit on 07/16/20  CBC with Differential/Platelet  Result Value Ref Range   WBC 9.7 4.0 - 10.5 K/uL   RBC 4.68 4.22 - 5.81 Mil/uL   Hemoglobin 14.7 13.0 - 17.0 g/dL   HCT 43.0 39.0 - 52.0 %   MCV 92.1 78.0 - 100.0 fl   MCHC 34.1 30.0 - 36.0 g/dL   RDW 13.4 11.5 - 15.5 %   Platelets 273.0 150.0 - 400.0 K/uL   Neutrophils Relative % 52.2 43.0 - 77.0 %   Lymphocytes Relative 35.1 12.0 - 46.0 %   Monocytes Relative 9.9 3.0 - 12.0 %   Eosinophils Relative 2.3 0.0 - 5.0 %   Basophils Relative 0.5 0.0 - 3.0 %   Neutro Abs 5.0 1.4 - 7.7 K/uL   Lymphs Abs 3.4 0.7 - 4.0 K/uL   Monocytes Absolute 1.0 0.1 - 1.0 K/uL   Eosinophils Absolute 0.2 0.0 - 0.7 K/uL   Basophils Absolute 0.1 0.0 - 0.1 K/uL  PSA, Medicare  Result Value Ref Range   PSA 0.63 0.10 - 4.00 ng/ml  VITAMIN D 25 Hydroxy (Vit-D Deficiency, Fractures)  Result Value Ref Range   VITD 54.99 30.00 - 100.00 ng/mL  Comprehensive metabolic panel  Result Value Ref Range   Sodium 141 135 - 145 mEq/L   Potassium 4.5 3.5 - 5.1 mEq/L   Chloride 105 96 - 112  mEq/L   CO2 30 19 - 32 mEq/L   Glucose, Bld 93  70 - 99 mg/dL   BUN 24 (H) 6 - 23 mg/dL   Creatinine, Ser 1.34 0.40 - 1.50 mg/dL   Total Bilirubin 0.5 0.2 - 1.2 mg/dL   Alkaline Phosphatase 33 (L) 39 - 117 U/L   AST 32 0 - 37 U/L   ALT 29 0 - 53 U/L   Total Protein 6.7 6.0 - 8.3 g/dL   Albumin 4.4 3.5 - 5.2 g/dL   GFR 57.21 (L) >60.00 mL/min   Calcium 9.6 8.4 - 10.5 mg/dL  Lipid panel  Result Value Ref Range   Cholesterol 148 0 - 200 mg/dL   Triglycerides 119.0 0.0 - 149.0 mg/dL   HDL 40.90 >39.00 mg/dL   VLDL 23.8 0.0 - 40.0 mg/dL   LDL Cholesterol 83 0 - 99 mg/dL   Total CHOL/HDL Ratio 4    NonHDL 106.71    Assessment & Plan:  This visit occurred during the SARS-CoV-2 public health emergency.  Safety protocols were in place, including screening questions prior to the visit, additional usage of staff PPE, and extensive cleaning of exam room while observing appropriate contact time as indicated for disinfecting solutions.   Problem List Items Addressed This Visit    Warfarin anticoagulation   Vitamin D deficiency    Stable on 2000 IU daily replacement      Obesity, Class I, BMI 30-34.9    Congratulated on weight loss noted. BMI now <35.       MDD (major depressive disorder), recurrent episode, moderate ([place])    Followed by psych, on adderall, cytomel, wellbutrin and sertraline.       Relevant Medications   sertraline (ZOLOFT) 50 MG tablet   Hypertrophic obstructive cardiomyopathy ([place])    S/p MAZE myomectomy closely followed by cardiology.      Relevant Medications   rosuvastatin (CRESTOR) 10 MG tablet   Hypertriglyceridemia    Marked improvement since adding fibrate, now with addition of statin. Seems to be tolerating meds well. Continue.       Relevant Medications   rosuvastatin (CRESTOR) 10 MG tablet   Dyslipidemia - Primary    Chronic, has been started on rosuvastatin by cardiology lipid clinic. Trig markedly improved, LDL remains stable.  The ASCVD Risk  score Mikey Bussing DC Jr., et al., 2013) failed to calculate for the following reasons:   The patient has a prior MI or stroke diagnosis       Relevant Medications   rosuvastatin (CRESTOR) 10 MG tablet       Meds ordered this encounter  Medications  . Glucosamine-Vitamin D 1000-200 MG-UNIT TABS    Sig: Take 2 tablets by mouth daily.   No orders of the defined types were placed in this encounter.   Patient Instructions  You are doing well today Return as needed or in 6 months for wellness visit. Try to increase water by 1-2 glasses a day.   Follow up plan: Return in about 6 months (around 01/18/2021) for medicare wellness visit.  Ria Bush, MD

## 2020-07-22 ENCOUNTER — Encounter: Payer: Self-pay | Admitting: Family Medicine

## 2020-07-22 DIAGNOSIS — I69311 Memory deficit following cerebral infarction: Secondary | ICD-10-CM | POA: Diagnosis not present

## 2020-07-22 DIAGNOSIS — F3341 Major depressive disorder, recurrent, in partial remission: Secondary | ICD-10-CM | POA: Diagnosis not present

## 2020-07-22 DIAGNOSIS — F411 Generalized anxiety disorder: Secondary | ICD-10-CM | POA: Diagnosis not present

## 2020-07-22 DIAGNOSIS — F900 Attention-deficit hyperactivity disorder, predominantly inattentive type: Secondary | ICD-10-CM | POA: Diagnosis not present

## 2020-07-22 DIAGNOSIS — Z79899 Other long term (current) drug therapy: Secondary | ICD-10-CM | POA: Diagnosis not present

## 2020-07-22 NOTE — Assessment & Plan Note (Signed)
Chronic, has been started on rosuvastatin by cardiology lipid clinic. Trig markedly improved, LDL remains stable.  The ASCVD Risk score Dylan Bussing DC Jr., et al., 2013) failed to calculate for the following reasons:   The patient has a prior MI or stroke diagnosis

## 2020-07-22 NOTE — Assessment & Plan Note (Signed)
Congratulated on weight loss noted. BMI now <35.

## 2020-07-22 NOTE — Assessment & Plan Note (Signed)
Marked improvement since adding fibrate, now with addition of statin. Seems to be tolerating meds well. Continue.

## 2020-07-22 NOTE — Assessment & Plan Note (Signed)
Stable on 2000 IU daily replacement

## 2020-07-22 NOTE — Assessment & Plan Note (Addendum)
Followed by psych, on adderall, cytomel, wellbutrin and sertraline.

## 2020-07-22 NOTE — Assessment & Plan Note (Signed)
S/p MAZE myomectomy closely followed by cardiology.

## 2020-08-05 DIAGNOSIS — I4891 Unspecified atrial fibrillation: Secondary | ICD-10-CM | POA: Diagnosis not present

## 2020-08-05 DIAGNOSIS — Z952 Presence of prosthetic heart valve: Secondary | ICD-10-CM | POA: Diagnosis not present

## 2020-08-11 DIAGNOSIS — E291 Testicular hypofunction: Secondary | ICD-10-CM | POA: Diagnosis not present

## 2020-08-11 DIAGNOSIS — R5383 Other fatigue: Secondary | ICD-10-CM | POA: Diagnosis not present

## 2020-08-12 DIAGNOSIS — I4891 Unspecified atrial fibrillation: Secondary | ICD-10-CM | POA: Diagnosis not present

## 2020-08-18 DIAGNOSIS — I480 Paroxysmal atrial fibrillation: Secondary | ICD-10-CM | POA: Diagnosis not present

## 2020-09-07 DIAGNOSIS — E782 Mixed hyperlipidemia: Secondary | ICD-10-CM | POA: Diagnosis not present

## 2020-09-07 DIAGNOSIS — I421 Obstructive hypertrophic cardiomyopathy: Secondary | ICD-10-CM | POA: Diagnosis not present

## 2020-09-07 DIAGNOSIS — I480 Paroxysmal atrial fibrillation: Secondary | ICD-10-CM | POA: Diagnosis not present

## 2020-09-07 DIAGNOSIS — R42 Dizziness and giddiness: Secondary | ICD-10-CM | POA: Diagnosis not present

## 2020-09-07 DIAGNOSIS — Z6833 Body mass index (BMI) 33.0-33.9, adult: Secondary | ICD-10-CM | POA: Diagnosis not present

## 2020-09-07 DIAGNOSIS — I493 Ventricular premature depolarization: Secondary | ICD-10-CM | POA: Diagnosis not present

## 2020-09-07 DIAGNOSIS — Z952 Presence of prosthetic heart valve: Secondary | ICD-10-CM | POA: Diagnosis not present

## 2020-09-23 DIAGNOSIS — F900 Attention-deficit hyperactivity disorder, predominantly inattentive type: Secondary | ICD-10-CM | POA: Diagnosis not present

## 2020-09-23 DIAGNOSIS — F411 Generalized anxiety disorder: Secondary | ICD-10-CM | POA: Diagnosis not present

## 2020-09-23 DIAGNOSIS — Z79899 Other long term (current) drug therapy: Secondary | ICD-10-CM | POA: Diagnosis not present

## 2020-09-23 DIAGNOSIS — I69311 Memory deficit following cerebral infarction: Secondary | ICD-10-CM | POA: Diagnosis not present

## 2020-09-23 DIAGNOSIS — F3341 Major depressive disorder, recurrent, in partial remission: Secondary | ICD-10-CM | POA: Diagnosis not present

## 2020-10-14 DIAGNOSIS — I4891 Unspecified atrial fibrillation: Secondary | ICD-10-CM | POA: Diagnosis not present

## 2020-10-29 DIAGNOSIS — Z952 Presence of prosthetic heart valve: Secondary | ICD-10-CM | POA: Diagnosis not present

## 2020-11-17 DIAGNOSIS — F411 Generalized anxiety disorder: Secondary | ICD-10-CM | POA: Diagnosis not present

## 2020-11-17 DIAGNOSIS — I69311 Memory deficit following cerebral infarction: Secondary | ICD-10-CM | POA: Diagnosis not present

## 2020-11-17 DIAGNOSIS — F900 Attention-deficit hyperactivity disorder, predominantly inattentive type: Secondary | ICD-10-CM | POA: Diagnosis not present

## 2020-11-17 DIAGNOSIS — F3342 Major depressive disorder, recurrent, in full remission: Secondary | ICD-10-CM | POA: Diagnosis not present

## 2020-11-17 DIAGNOSIS — Z79899 Other long term (current) drug therapy: Secondary | ICD-10-CM | POA: Diagnosis not present

## 2020-11-29 ENCOUNTER — Encounter: Payer: Self-pay | Admitting: Family Medicine

## 2020-12-02 DIAGNOSIS — Z952 Presence of prosthetic heart valve: Secondary | ICD-10-CM | POA: Diagnosis not present

## 2020-12-30 ENCOUNTER — Other Ambulatory Visit: Payer: Self-pay | Admitting: Family Medicine

## 2020-12-30 DIAGNOSIS — I4891 Unspecified atrial fibrillation: Secondary | ICD-10-CM | POA: Diagnosis not present

## 2021-01-10 ENCOUNTER — Other Ambulatory Visit: Payer: Self-pay | Admitting: Family Medicine

## 2021-01-10 DIAGNOSIS — Z8679 Personal history of other diseases of the circulatory system: Secondary | ICD-10-CM

## 2021-01-10 DIAGNOSIS — Z7901 Long term (current) use of anticoagulants: Secondary | ICD-10-CM

## 2021-01-10 DIAGNOSIS — I10 Essential (primary) hypertension: Secondary | ICD-10-CM

## 2021-01-10 DIAGNOSIS — E559 Vitamin D deficiency, unspecified: Secondary | ICD-10-CM

## 2021-01-10 DIAGNOSIS — E785 Hyperlipidemia, unspecified: Secondary | ICD-10-CM

## 2021-01-10 DIAGNOSIS — Z125 Encounter for screening for malignant neoplasm of prostate: Secondary | ICD-10-CM

## 2021-01-12 ENCOUNTER — Other Ambulatory Visit (INDEPENDENT_AMBULATORY_CARE_PROVIDER_SITE_OTHER): Payer: Medicare Other

## 2021-01-12 ENCOUNTER — Other Ambulatory Visit: Payer: Self-pay

## 2021-01-12 DIAGNOSIS — E559 Vitamin D deficiency, unspecified: Secondary | ICD-10-CM

## 2021-01-12 DIAGNOSIS — I10 Essential (primary) hypertension: Secondary | ICD-10-CM | POA: Diagnosis not present

## 2021-01-12 DIAGNOSIS — Z7901 Long term (current) use of anticoagulants: Secondary | ICD-10-CM

## 2021-01-12 DIAGNOSIS — E785 Hyperlipidemia, unspecified: Secondary | ICD-10-CM

## 2021-01-12 DIAGNOSIS — Z125 Encounter for screening for malignant neoplasm of prostate: Secondary | ICD-10-CM | POA: Diagnosis not present

## 2021-01-12 LAB — LIPID PANEL
Cholesterol: 139 mg/dL (ref 0–200)
HDL: 43.4 mg/dL (ref >39.00)
LDL Cholesterol: 80 mg/dL (ref 0–99)
NonHDL: 95.89
Total CHOL/HDL Ratio: 3
Triglycerides: 78 mg/dL (ref 0.0–149.0)
VLDL: 15.6 mg/dL (ref 0.0–40.0)

## 2021-01-12 LAB — CBC WITH DIFFERENTIAL/PLATELET
Basophils Absolute: 0.1 10*3/uL (ref 0.0–0.1)
Basophils Relative: 0.8 % (ref 0.0–3.0)
Eosinophils Absolute: 0.2 10*3/uL (ref 0.0–0.7)
Eosinophils Relative: 2.1 % (ref 0.0–5.0)
HCT: 42.6 % (ref 39.0–52.0)
Hemoglobin: 14.2 g/dL (ref 13.0–17.0)
Lymphocytes Relative: 43.1 % (ref 12.0–46.0)
Lymphs Abs: 3.6 10*3/uL (ref 0.7–4.0)
MCHC: 33.4 g/dL (ref 30.0–36.0)
MCV: 91.8 fl (ref 78.0–100.0)
Monocytes Absolute: 0.9 10*3/uL (ref 0.1–1.0)
Monocytes Relative: 11.1 % (ref 3.0–12.0)
Neutro Abs: 3.6 10*3/uL (ref 1.4–7.7)
Neutrophils Relative %: 42.9 % — ABNORMAL LOW (ref 43.0–77.0)
Platelets: 285 10*3/uL (ref 150.0–400.0)
RBC: 4.64 Mil/uL (ref 4.22–5.81)
RDW: 14 % (ref 11.5–15.5)
WBC: 8.4 10*3/uL (ref 4.0–10.5)

## 2021-01-12 LAB — COMPREHENSIVE METABOLIC PANEL
ALT: 24 U/L (ref 0–53)
AST: 29 U/L (ref 0–37)
Albumin: 4.3 g/dL (ref 3.5–5.2)
Alkaline Phosphatase: 30 U/L — ABNORMAL LOW (ref 39–117)
BUN: 20 mg/dL (ref 6–23)
CO2: 26 mEq/L (ref 19–32)
Calcium: 8.9 mg/dL (ref 8.4–10.5)
Chloride: 106 mEq/L (ref 96–112)
Creatinine, Ser: 1.27 mg/dL (ref 0.40–1.50)
GFR: 60.8 mL/min (ref >60.00)
Glucose, Bld: 103 mg/dL — ABNORMAL HIGH (ref 70–99)
Potassium: 4.5 mEq/L (ref 3.5–5.1)
Sodium: 138 mEq/L (ref 135–145)
Total Bilirubin: 0.4 mg/dL (ref 0.2–1.2)
Total Protein: 6.6 g/dL (ref 6.0–8.3)

## 2021-01-12 LAB — PSA, MEDICARE: PSA: 0.77 ng/ml (ref 0.10–4.00)

## 2021-01-12 LAB — MICROALBUMIN / CREATININE URINE RATIO
Creatinine,U: 167.4 mg/dL
Microalb Creat Ratio: 0.4 mg/g (ref 0.0–30.0)
Microalb, Ur: <0.7 mg/dL (ref 0.0–1.9)

## 2021-01-12 LAB — VITAMIN D 25 HYDROXY (VIT D DEFICIENCY, FRACTURES): VITD: 54.43 ng/mL (ref 30.00–100.00)

## 2021-01-19 ENCOUNTER — Other Ambulatory Visit: Payer: Self-pay

## 2021-01-19 ENCOUNTER — Encounter: Payer: Self-pay | Admitting: Family Medicine

## 2021-01-19 ENCOUNTER — Ambulatory Visit (INDEPENDENT_AMBULATORY_CARE_PROVIDER_SITE_OTHER): Payer: Medicare Other | Admitting: Family Medicine

## 2021-01-19 VITALS — BP 130/80 | HR 76 | Temp 97.6°F | Ht 65.5 in | Wt 209.1 lb

## 2021-01-19 DIAGNOSIS — I421 Obstructive hypertrophic cardiomyopathy: Secondary | ICD-10-CM

## 2021-01-19 DIAGNOSIS — Z0000 Encounter for general adult medical examination without abnormal findings: Secondary | ICD-10-CM

## 2021-01-19 DIAGNOSIS — Z7901 Long term (current) use of anticoagulants: Secondary | ICD-10-CM

## 2021-01-19 DIAGNOSIS — G4733 Obstructive sleep apnea (adult) (pediatric): Secondary | ICD-10-CM | POA: Diagnosis not present

## 2021-01-19 DIAGNOSIS — I6930 Unspecified sequelae of cerebral infarction: Secondary | ICD-10-CM

## 2021-01-19 DIAGNOSIS — E785 Hyperlipidemia, unspecified: Secondary | ICD-10-CM

## 2021-01-19 DIAGNOSIS — E291 Testicular hypofunction: Secondary | ICD-10-CM

## 2021-01-19 DIAGNOSIS — F331 Major depressive disorder, recurrent, moderate: Secondary | ICD-10-CM

## 2021-01-19 DIAGNOSIS — Z7189 Other specified counseling: Secondary | ICD-10-CM | POA: Diagnosis not present

## 2021-01-19 DIAGNOSIS — F418 Other specified anxiety disorders: Secondary | ICD-10-CM | POA: Diagnosis not present

## 2021-01-19 DIAGNOSIS — E669 Obesity, unspecified: Secondary | ICD-10-CM

## 2021-01-19 DIAGNOSIS — I10 Essential (primary) hypertension: Secondary | ICD-10-CM | POA: Diagnosis not present

## 2021-01-19 DIAGNOSIS — E559 Vitamin D deficiency, unspecified: Secondary | ICD-10-CM

## 2021-01-19 DIAGNOSIS — R5382 Chronic fatigue, unspecified: Secondary | ICD-10-CM

## 2021-01-19 DIAGNOSIS — Z86010 Personal history of colonic polyps: Secondary | ICD-10-CM

## 2021-01-19 DIAGNOSIS — I69319 Unspecified symptoms and signs involving cognitive functions following cerebral infarction: Secondary | ICD-10-CM

## 2021-01-19 DIAGNOSIS — E781 Pure hyperglyceridemia: Secondary | ICD-10-CM

## 2021-01-19 DIAGNOSIS — H8102 Meniere's disease, left ear: Secondary | ICD-10-CM

## 2021-01-19 DIAGNOSIS — Z952 Presence of prosthetic heart valve: Secondary | ICD-10-CM | POA: Diagnosis not present

## 2021-01-19 NOTE — Assessment & Plan Note (Signed)
Chronic, stable period on rosuvastatin and fenofibrate.  The ASCVD Risk score Mikey Bussing DC Jr., et al., 2013) failed to calculate for the following reasons:   The patient has a prior MI or stroke diagnosis

## 2021-01-19 NOTE — Assessment & Plan Note (Signed)
S/p MAZE myomectomy 08/2013

## 2021-01-19 NOTE — Assessment & Plan Note (Signed)
Followed by [facility] cardiology coumadin clinic

## 2021-01-19 NOTE — Assessment & Plan Note (Signed)

## 2021-01-19 NOTE — Assessment & Plan Note (Signed)
-   Continues CPAP  

## 2021-01-19 NOTE — Assessment & Plan Note (Signed)
Advanced directive planning: discussed, does not have set up. Would want wife to be HCPOA. Packet previously provided.

## 2021-01-19 NOTE — Patient Instructions (Addendum)
Contact [facility] to discuss repeat colonoscopy  Send [place] dates of booster.  If interested, check with pharmacy about new 2 shot shingles series (shingrix).  Work on Scientist, physiological.  Return as needed or in 6 months for follow up visit.   Health Maintenance, Male Adopting a healthy lifestyle and getting preventive care are important in promoting health and wellness. Ask your health care provider about: The right schedule for you to have regular tests and exams. Things you can do on your own to prevent diseases and keep yourself healthy. What should I know about diet, weight, and exercise? Eat a healthy diet  Eat a diet that includes plenty of vegetables, fruits, low-fat dairy products, and lean protein. Do not eat a lot of foods that are high in solid fats, added sugars, or sodium.  Maintain a healthy weight Body mass index (BMI) is a measurement that can be used to identify possible weight problems. It estimates body fat based on height and weight. Your health care provider can help determine your BMI and help you achieve or maintain ahealthy weight. Get regular exercise Get regular exercise. This is one of the most important things you can do for your health. Most adults should: Exercise for at least 150 minutes each week. The exercise should increase your heart rate and make you sweat (moderate-intensity exercise). Do strengthening exercises at least twice a week. This is in addition to the moderate-intensity exercise. Spend less time sitting. Even light physical activity can be beneficial. Watch cholesterol and blood lipids Have your blood tested for lipids and cholesterol at 62 years of age, then havethis test every 5 years. You may need to have your cholesterol levels checked more often if: Your lipid or cholesterol levels are high. You are older than 62 years of age. You are at high risk for heart disease. What should I know about cancer screening? Many types of cancers can be  detected early and may often be prevented. Depending on your health history and family history, you may need to have cancer screening at various ages. This may include screening for: Colorectal cancer. Prostate cancer. Skin cancer. Lung cancer. What should I know about heart disease, diabetes, and high blood pressure? Blood pressure and heart disease High blood pressure causes heart disease and increases the risk of stroke. This is more likely to develop in people who have high blood pressure readings, are of African descent, or are overweight. Talk with your health care provider about your target blood pressure readings. Have your blood pressure checked: Every 3-5 years if you are 23-73 years of age. Every year if you are 15 years old or older. If you are between the ages of 77 and 59 and are a current or former smoker, ask your health care provider if you should have a one-time screening for abdominal aortic aneurysm (AAA). Diabetes Have regular diabetes screenings. This checks your fasting blood sugar level. Have the screening done: Once every three years after age 43 if you are at a normal weight and have a low risk for diabetes. More often and at a younger age if you are overweight or have a high risk for diabetes. What should I know about preventing infection? Hepatitis B If you have a higher risk for hepatitis B, you should be screened for this virus. Talk with your health care provider to find out if you are at risk forhepatitis B infection. Hepatitis C Blood testing is recommended for: Everyone born from [birth year] through 1967.  Anyone with known risk factors for hepatitis C. Sexually transmitted infections (STIs) You should be screened each year for STIs, including gonorrhea and chlamydia, if: You are sexually active and are younger than 62 years of age. You are older than 62 years of age and your health care provider tells you that you are at risk for this type of infection. Your  sexual activity has changed since you were last screened, and you are at increased risk for chlamydia or gonorrhea. Ask your health care provider if you are at risk. Ask your health care provider about whether you are at high risk for HIV. Your health care provider may recommend a prescription medicine to help prevent HIV infection. If you choose to take medicine to prevent HIV, you should first get tested for HIV. You should then be tested every 3 months for as long as you are taking the medicine. Follow these instructions at home: Lifestyle Do not use any products that contain nicotine or tobacco, such as cigarettes, e-cigarettes, and chewing tobacco. If you need help quitting, ask your health care provider. Do not use street drugs. Do not share needles. Ask your health care provider for help if you need support or information about quitting drugs. Alcohol use Do not drink alcohol if your health care provider tells you not to drink. If you drink alcohol: Limit how much you have to 0-2 drinks a day. Be aware of how much alcohol is in your drink. In the [place] one drink equals one 12 oz bottle of beer (355 mL), one 5 oz glass of wine (148 mL), or one 1 oz glass of hard liquor (44 mL). General instructions Schedule regular health, dental, and eye exams. Stay current with your vaccines. Tell your health care provider if: You often feel depressed. You have ever been abused or do not feel safe at home. Summary Adopting a healthy lifestyle and getting preventive care are important in promoting health and wellness. Follow your health care provider's instructions about healthy diet, exercising, and getting tested or screened for diseases. Follow your health care provider's instructions on monitoring your cholesterol and blood pressure. This information is not intended to replace advice given to you by your health care provider. Make sure you discuss any questions you have with your healthcare  provider. Document Revised: 05/29/2018 Document Reviewed: 05/29/2018 Elsevier Patient Education  2022 [employer].

## 2021-01-19 NOTE — Assessment & Plan Note (Deleted)
Followed by psychiatry 

## 2021-01-19 NOTE — Assessment & Plan Note (Signed)
Chronic short term memory trouble

## 2021-01-19 NOTE — Assessment & Plan Note (Addendum)
Continue 2000 IU daily.  

## 2021-01-19 NOTE — Assessment & Plan Note (Signed)
Chronic, stable on current regimen.  

## 2021-01-19 NOTE — Assessment & Plan Note (Signed)
Overdue for colonoscopy -they will contact [facility] for plan.

## 2021-01-19 NOTE — Assessment & Plan Note (Addendum)
Stable period off treatment - sees endocrinology

## 2021-01-19 NOTE — Progress Notes (Signed)
Patient ID: Dylan Fletcher, male    DOB: [birth year], 60 y.o.   MRN: [id number]  This visit was conducted in person.  BP 130/80   Pulse 76   Temp 97.6 F (36.4 C) (Temporal)   Ht 5' 5.5" (1.664 m)   Wt 209 lb 1 oz (94.8 kg)   SpO2 99%   BMI 34.26 kg/m    CC: AMW Subjective:   HPI: Dylan Fletcher is a 60 y.o. male presenting on 01/19/2021 for Medicare Wellness (Pt accompanied by wife, [name]- temp 97.8.)   Did not see health advisor this year.   Hearing Screening   '500Hz'$  '1000Hz'$  '2000Hz'$  '4000Hz'$   Right ear '20 20 20 20  '$ Left ear 0 '25 25 25   '$ Vision Screening   Right eye Left eye Both eyes  Without correction     With correction '20/20 20/25 20/20 '$    Flowsheet Row Office Visit from 01/19/2021 in [place] at [facility]  PHQ-2 Total Score 0       Fall Risk  01/19/2021 07/21/2020 11/10/2019 06/24/2018 12/18/2014  Falls in the past year? 1 1 0 1 No  Number falls in past yr: 0 0 0 0 -  Injury with Fall? 0 1 0 0 -  Comment - Laceration on head - - -  Risk for fall due to : - - Mental status change - -  Follow up - - Falls evaluation completed;Falls prevention discussed - -  Vertigo related fall without injury. Known h/o L ear meniere's managed with low sodium diet, followed by ENT Dr Richardson Landry. H/o BPPV as well. Already limits sodium to 1000-'1200mg'$ /day.   SIL moved in - wife is caring for her as well.   H/o L thalamic stroke 09/2013 after MAZE myomectomy and MVR for HOCM and afib. [facility] cardiology (Dr Cristobal Goldmann)  Hypogonadism followed by endo Dr Honor Junes - clomid stopped 10/2019 due to concern over marked triglyceride elevation. Discussing restarting.    OSA continues auto CPAP use.   HLD - followed by lipid clinic on fenofibrate '145mg'$  and rosuvastatin '10mg'$  daily.    Preventative: COLONOSCOPY Date: 04/2014 mild diverticulosis, no polyps, rec rpt 5 yrs [facility]) - will contact [facility] to reschedule colon cancer screening  Prostate cancer screening - continues yearly PSA  with intermittent DRE Lung cancer screening - not eligible  Flu yearly COVID vaccine - Moderna 06/2019, 09/2019, booster x1 Tdap 01/2013, 11/2019  Pneumovax 2016  Shingrix - discussed, will check at local pharmacy. Advanced directive planning: discussed, does not have set up. Would want wife to be HCPOA. Packet previously provided.  Seat belt use discussed  Sunscreen use discussed. No changing moles on skin. 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 Interim medical history since our last visit reviewed. Allergies and medications reviewed and updated. Outpatient Medications Prior to Visit  Medication Sig Dispense Refill   acetaminophen (TYLENOL) 500 MG tablet Take 1 tablet (500 mg total) by mouth 2 (two) times daily.     Acetaminophen-Codeine 300-30 MG tablet TAKE 1 TABLET BY MOUTH 3 (THREE) TIMES DAILY AS  NEEDED FOR MODERATE PAIN (HEADACHE). 15 tablet 0   amoxicillin (AMOXIL) 500 MG tablet Take 500 mg by mouth as directed. Takes 4 tablets 1 hour prior to dental procedures     amphetamine-dextroamphetamine (ADDERALL XR) 20 MG 24 hr capsule Take 20 mg by mouth daily. Takes 2 tablets every morning     amphetamine-dextroamphetamine (ADDERALL) 30 MG tablet Take 30 mg by mouth daily. Takes in the afternoon  0   buPROPion (WELLBUTRIN XL) 150 MG 24 hr tablet Take 1 tablet by mouth daily.     cetirizine (ZYRTEC) 10 MG tablet Take 10 mg by mouth daily as needed.      famotidine (PEPCID) 20 MG tablet Take 20 mg by mouth daily.     fenofibrate (TRICOR) 145 MG tablet TAKE 1 TABLET BY MOUTH EVERY DAY 30 tablet 11    furosemide (LASIX) 20 MG tablet Take 1 tablet (20 mg total) by mouth daily. With extra prn weight gain     Glucosamine-Vitamin D 1000-200 MG-UNIT TABS Take 2 tablets by mouth daily.     liothyronine (CYTOMEL) 5 MCG tablet Take 2 tablets by mouth daily.     metoprolol succinate (TOPROL-XL) 25 MG 24 hr tablet Take 25 mg by mouth daily. Takes 2 tablets for excessive palpitations     Multiple Vitamin (MULTIVITAMIN) tablet Take 1 tablet by mouth daily.     oxyCODONE-acetaminophen (PERCOCET/ROXICET) 5-325 MG per tablet Take 1 tablet by mouth every 4 (four) hours as needed for severe pain.     rosuvastatin (CRESTOR) 10 MG tablet Take 10 mg by mouth at bedtime.     sertraline (ZOLOFT) 50 MG tablet Take 50 mg by mouth daily.     sildenafil (VIAGRA) 100 MG tablet Take 1 tablet (100 mg total) by mouth daily as needed for erectile dysfunction. 10 tablet 3   warfarin (COUMADIN) 5 MG tablet Take 5 mg by mouth as directed.      amoxicillin (AMOXIL) 500 MG capsule Takes 1 hour before dental procedures     No facility-administered medications prior to visit.     Per HPI unless specifically indicated in ROS section below Review of Systems  Objective:  BP 130/80   Pulse 76   Temp 97.6 F (36.4 C) (Temporal)   Ht 5' 5.5" (1.664 m)   Wt 209 lb 1 oz (94.8 kg)   SpO2 99%   BMI 34.26 kg/m   Wt Readings from Last 3 Encounters:  01/19/21 209 lb 1 oz (94.8 kg)  07/21/20 210 lb 1 oz (95.3 kg)  01/21/20 215 lb (97.5 kg)      Physical Exam Vitals and nursing note reviewed.  Constitutional:      General: He is not in acute distress.    Appearance: Normal appearance. He is well-developed. He is not ill-appearing.  HENT:     Head: Normocephalic and atraumatic.     Right Ear: Hearing, tympanic membrane, ear canal and external ear normal.     Left Ear: Hearing, tympanic membrane, ear canal and external ear normal.  Eyes:     General: No scleral icterus.    Extraocular Movements: Extraocular movements  intact.     Conjunctiva/sclera: Conjunctivae normal.     Pupils: Pupils are equal, round, and reactive to light.  Neck:     Thyroid: No thyroid mass or thyromegaly.     Vascular: No carotid bruit.  Cardiovascular:     Rate and Rhythm: Normal rate and regular rhythm.     Pulses: Normal  pulses.          Radial pulses are 2+ on the right side and 2+ on the left side.     Heart sounds: Normal heart sounds. No murmur heard. Pulmonary:     Effort: Pulmonary effort is normal. No respiratory distress.     Breath sounds: Normal breath sounds. No wheezing, rhonchi or rales.  Abdominal:     General: Bowel sounds are normal. There is no distension.     Palpations: Abdomen is soft. There is no mass.     Tenderness: There is no abdominal tenderness. There is no guarding or rebound.     Hernia: No hernia is present.  Musculoskeletal:        General: Normal range of motion.     Cervical back: Normal range of motion and neck supple.     Right lower leg: No edema.     Left lower leg: No edema.  Lymphadenopathy:     Cervical: No cervical adenopathy.  Skin:    General: Skin is warm and dry.     Findings: No rash.  Neurological:     General: No focal deficit present.     Mental Status: He is alert and oriented to person, place, and time.     Comments:  Recall 0/3, 1/3 with cue Calculation 5/5 DLROW  Psychiatric:        Mood and Affect: Mood normal.        Behavior: Behavior normal.        Thought Content: Thought content normal.        Judgment: Judgment normal.      Results for orders placed or performed in visit on 01/12/21  CBC with Differential/Platelet  Result Value Ref Range   WBC 8.4 4.0 - 10.5 K/uL   RBC 4.64 4.22 - 5.81 Mil/uL   Hemoglobin 14.2 13.0 - 17.0 g/dL   HCT 42.6 39.0 - 52.0 %   MCV 91.8 78.0 - 100.0 fl   MCHC 33.4 30.0 - 36.0 g/dL   RDW 14.0 11.5 - 15.5 %   Platelets 285.0 150.0 - 400.0 K/uL   Neutrophils Relative % 42.9 (L) 43.0 - 77.0 %   Lymphocytes Relative  43.1 12.0 - 46.0 %   Monocytes Relative 11.1 3.0 - 12.0 %   Eosinophils Relative 2.1 0.0 - 5.0 %   Basophils Relative 0.8 0.0 - 3.0 %   Neutro Abs 3.6 1.4 - 7.7 K/uL   Lymphs Abs 3.6 0.7 - 4.0 K/uL   Monocytes Absolute 0.9 0.1 - 1.0 K/uL   Eosinophils Absolute 0.2 0.0 - 0.7 K/uL   Basophils Absolute 0.1 0.0 - 0.1 K/uL  PSA, Medicare  Result Value Ref Range   PSA 0.77 0.10 - 4.00 ng/ml  VITAMIN D 25 Hydroxy (Vit-D Deficiency, Fractures)  Result Value Ref Range   VITD 54.43 30.00 - 100.00 ng/mL  Comprehensive metabolic panel  Result Value Ref Range   Sodium 138 135 - 145 mEq/L   Potassium 4.5 3.5 - 5.1 mEq/L   Chloride 106 96 - 112 mEq/L   CO2 26 19 - 32 mEq/L   Glucose, Bld 103 (H) 70 - 99 mg/dL   BUN 20 6 - 23 mg/dL   Creatinine, Ser 1.27 0.40 - 1.50 mg/dL   Total Bilirubin 0.4 0.2 - 1.2 mg/dL   Alkaline Phosphatase 30 (L) 39 - 117 U/L   AST 29 0 - 37 U/L   ALT 24 0 - 53 U/L   Total Protein  6.6 6.0 - 8.3 g/dL   Albumin 4.3 3.5 - 5.2 g/dL   GFR 60.80 >60.00 mL/min   Calcium 8.9 8.4 - 10.5 mg/dL  Lipid panel  Result Value Ref Range   Cholesterol 139 0 - 200 mg/dL   Triglycerides 78.0 0.0 - 149.0 mg/dL   HDL 43.40 >39.00 mg/dL   VLDL 15.6 0.0 - 40.0 mg/dL   LDL Cholesterol 80 0 - 99 mg/dL   Total CHOL/HDL Ratio 3    NonHDL 95.89   Microalbumin / creatinine urine ratio  Result Value Ref Range   Microalb, Ur <0.7 0.0 - 1.9 mg/dL   Creatinine,U 167.4 mg/dL   Microalb Creat Ratio 0.4 0.0 - 30.0 mg/g   Depression screen [facility] 2/9 01/19/2021 11/10/2019 06/24/2018 06/24/2018 06/18/2017  Decreased Interest 0 0 0 0 0  Down, Depressed, Hopeless 0 0 0 0 0  PHQ - 2 Score 0 0 0 0 0  Altered sleeping 0 0 - - -  Tired, decreased energy 3 0 - - -  Change in appetite 0 0 - - -  Feeling bad or failure about yourself  1 0 - - -  Trouble concentrating 3 0 - - -  Moving slowly or fidgety/restless 3 0 - - -  Suicidal thoughts 0 0 - - -  PHQ-9 Score 10 0 - - -  Difficult doing work/chores -  Not difficult at all - - -    GAD 7 : Generalized Anxiety Score 01/19/2021  Nervous, Anxious, on Edge 0  Control/stop worrying 1  Worry too much - different things 0  Trouble relaxing 0  Restless 0  Easily annoyed or irritable 2  Afraid - awful might happen 0  Total GAD 7 Score 3   Assessment & Plan:  This visit occurred during the SARS-CoV-2 public health emergency.  Safety protocols were in place, including screening questions prior to the visit, additional usage of staff PPE, and extensive cleaning of exam room while observing appropriate contact time as indicated for disinfecting solutions.   Problem List Items Addressed This Visit     Medicare annual wellness visit, subsequent - Primary (Chronic)    I have personally reviewed the Medicare Annual Wellness questionnaire and have noted 1. The patient's medical and social history 2. Their use of alcohol, tobacco or illicit drugs 3. Their current medications and supplements 4. The patient's functional ability including ADL's, fall risks, home safety risks and hearing or visual impairment. Cognitive function has been assessed and addressed as indicated.  5. Diet and physical activity 6. Evidence for depression or mood disorders The patients weight, height, BMI have been recorded in the chart. I have made referrals, counseling and provided education to the patient based on review of the above and I have provided the pt with a written personalized care plan for preventive services. Provider list updated.. See scanned questionairre as needed for further documentation. Reviewed preventative protocols and updated unless pt declined.       Advanced directives, counseling/discussion (Chronic)    Advanced directive planning: discussed, does not have set up. Would want wife to be HCPOA. Packet previously provided.        Vitamin D deficiency    Continue 2000 IU daily.        Hypertrophic obstructive cardiomyopathy ([place])    S/p MAZE  myomectomy 08/2013       OSA (obstructive sleep apnea)    Continues CPAP       Dyslipidemia    Chronic, stable  period on rosuvastatin and fenofibrate.  The ASCVD Risk score Mikey Bussing DC Jr., et al., 2013) failed to calculate for the following reasons:   The patient has a prior MI or stroke diagnosis        HTN (hypertension)    Chronic, stable on current regimen.        Anxiety associated with depression   Obesity, Class I, BMI 30-34.9   Warfarin anticoagulation    Followed by [facility] cardiology coumadin clinic       H/O mitral valve replacement   History of cerebrovascular accident (CVA) with residual deficit   Chronic fatigue   History of adenomatous polyp of colon    Overdue for colonoscopy -they will contact [facility] for plan.        Meniere disease, left   Hypogonadism in male    Stable period off treatment - sees endocrinology       Cognitive deficit as late effect of cerebrovascular accident (CVA)    Chronic short term memory trouble       Hypertriglyceridemia   MDD (major depressive disorder), recurrent episode, moderate ([place])    Followed by psychiatry.          No orders of the defined types were placed in this encounter.  No orders of the defined types were placed in this encounter.   Patient instructions: Contact [facility] to discuss repeat colonoscopy  Send [place] dates of booster.  If interested, check with pharmacy about new 2 shot shingles series (shingrix).  Work on Scientist, physiological.  Return as needed or in 6 months for follow up visit.   Follow up plan: Return in about 6 months (around 07/22/2021) for follow up visit.  Ria Bush, MD

## 2021-01-19 NOTE — Assessment & Plan Note (Signed)
Followed by psychiatry 

## 2021-01-22 DIAGNOSIS — S8012XA Contusion of left lower leg, initial encounter: Secondary | ICD-10-CM | POA: Diagnosis not present

## 2021-01-27 DIAGNOSIS — Z952 Presence of prosthetic heart valve: Secondary | ICD-10-CM | POA: Diagnosis not present

## 2021-02-02 ENCOUNTER — Encounter: Payer: Self-pay | Admitting: Family Medicine

## 2021-02-09 DIAGNOSIS — E291 Testicular hypofunction: Secondary | ICD-10-CM | POA: Diagnosis not present

## 2021-02-16 DIAGNOSIS — R5383 Other fatigue: Secondary | ICD-10-CM | POA: Diagnosis not present

## 2021-02-16 DIAGNOSIS — Z79899 Other long term (current) drug therapy: Secondary | ICD-10-CM | POA: Diagnosis not present

## 2021-02-16 DIAGNOSIS — I69311 Memory deficit following cerebral infarction: Secondary | ICD-10-CM | POA: Diagnosis not present

## 2021-02-16 DIAGNOSIS — E291 Testicular hypofunction: Secondary | ICD-10-CM | POA: Diagnosis not present

## 2021-02-16 DIAGNOSIS — F411 Generalized anxiety disorder: Secondary | ICD-10-CM | POA: Diagnosis not present

## 2021-02-16 DIAGNOSIS — F3342 Major depressive disorder, recurrent, in full remission: Secondary | ICD-10-CM | POA: Diagnosis not present

## 2021-03-17 DIAGNOSIS — Z23 Encounter for immunization: Secondary | ICD-10-CM | POA: Diagnosis not present

## 2021-03-22 DIAGNOSIS — Z952 Presence of prosthetic heart valve: Secondary | ICD-10-CM | POA: Diagnosis not present

## 2021-03-22 DIAGNOSIS — I480 Paroxysmal atrial fibrillation: Secondary | ICD-10-CM | POA: Diagnosis not present

## 2021-03-22 DIAGNOSIS — I421 Obstructive hypertrophic cardiomyopathy: Secondary | ICD-10-CM | POA: Diagnosis not present

## 2021-03-22 DIAGNOSIS — I493 Ventricular premature depolarization: Secondary | ICD-10-CM | POA: Insufficient documentation

## 2021-03-22 DIAGNOSIS — I440 Atrioventricular block, first degree: Secondary | ICD-10-CM | POA: Diagnosis not present

## 2021-03-22 DIAGNOSIS — I447 Left bundle-branch block, unspecified: Secondary | ICD-10-CM | POA: Diagnosis not present

## 2021-03-22 DIAGNOSIS — Z6833 Body mass index (BMI) 33.0-33.9, adult: Secondary | ICD-10-CM | POA: Diagnosis not present

## 2021-03-24 DIAGNOSIS — Z952 Presence of prosthetic heart valve: Secondary | ICD-10-CM | POA: Diagnosis not present

## 2021-03-24 DIAGNOSIS — I4891 Unspecified atrial fibrillation: Secondary | ICD-10-CM | POA: Diagnosis not present

## 2021-03-30 DIAGNOSIS — Z23 Encounter for immunization: Secondary | ICD-10-CM | POA: Diagnosis not present

## 2021-04-07 DIAGNOSIS — R5383 Other fatigue: Secondary | ICD-10-CM | POA: Diagnosis not present

## 2021-04-07 DIAGNOSIS — G4730 Sleep apnea, unspecified: Secondary | ICD-10-CM | POA: Diagnosis not present

## 2021-04-07 DIAGNOSIS — I421 Obstructive hypertrophic cardiomyopathy: Secondary | ICD-10-CM | POA: Diagnosis not present

## 2021-04-07 DIAGNOSIS — R0602 Shortness of breath: Secondary | ICD-10-CM | POA: Diagnosis not present

## 2021-04-07 DIAGNOSIS — Z6833 Body mass index (BMI) 33.0-33.9, adult: Secondary | ICD-10-CM | POA: Diagnosis not present

## 2021-04-07 DIAGNOSIS — I4891 Unspecified atrial fibrillation: Secondary | ICD-10-CM | POA: Diagnosis not present

## 2021-04-07 DIAGNOSIS — Z952 Presence of prosthetic heart valve: Secondary | ICD-10-CM | POA: Diagnosis not present

## 2021-04-07 DIAGNOSIS — I447 Left bundle-branch block, unspecified: Secondary | ICD-10-CM | POA: Diagnosis not present

## 2021-04-07 DIAGNOSIS — I480 Paroxysmal atrial fibrillation: Secondary | ICD-10-CM | POA: Diagnosis not present

## 2021-04-15 DIAGNOSIS — I421 Obstructive hypertrophic cardiomyopathy: Secondary | ICD-10-CM | POA: Diagnosis not present

## 2021-04-15 DIAGNOSIS — R0602 Shortness of breath: Secondary | ICD-10-CM | POA: Diagnosis not present

## 2021-04-15 DIAGNOSIS — I480 Paroxysmal atrial fibrillation: Secondary | ICD-10-CM | POA: Diagnosis not present

## 2021-04-15 DIAGNOSIS — I447 Left bundle-branch block, unspecified: Secondary | ICD-10-CM | POA: Diagnosis not present

## 2021-04-15 DIAGNOSIS — Z952 Presence of prosthetic heart valve: Secondary | ICD-10-CM | POA: Diagnosis not present

## 2021-05-19 DIAGNOSIS — Z952 Presence of prosthetic heart valve: Secondary | ICD-10-CM | POA: Diagnosis not present

## 2021-05-19 DIAGNOSIS — F411 Generalized anxiety disorder: Secondary | ICD-10-CM | POA: Diagnosis not present

## 2021-05-19 DIAGNOSIS — H43813 Vitreous degeneration, bilateral: Secondary | ICD-10-CM | POA: Diagnosis not present

## 2021-05-19 DIAGNOSIS — F3342 Major depressive disorder, recurrent, in full remission: Secondary | ICD-10-CM | POA: Diagnosis not present

## 2021-05-19 DIAGNOSIS — I69311 Memory deficit following cerebral infarction: Secondary | ICD-10-CM | POA: Diagnosis not present

## 2021-05-19 DIAGNOSIS — Z79899 Other long term (current) drug therapy: Secondary | ICD-10-CM | POA: Diagnosis not present

## 2021-06-01 DIAGNOSIS — F909 Attention-deficit hyperactivity disorder, unspecified type: Secondary | ICD-10-CM | POA: Diagnosis not present

## 2021-06-01 DIAGNOSIS — F0632 Mood disorder due to known physiological condition with major depressive-like episode: Secondary | ICD-10-CM | POA: Diagnosis not present

## 2021-06-09 ENCOUNTER — Telehealth: Payer: Self-pay | Admitting: Family Medicine

## 2021-06-09 DIAGNOSIS — F909 Attention-deficit hyperactivity disorder, unspecified type: Secondary | ICD-10-CM | POA: Diagnosis not present

## 2021-06-09 DIAGNOSIS — F0632 Mood disorder due to known physiological condition with major depressive-like episode: Secondary | ICD-10-CM | POA: Diagnosis not present

## 2021-06-09 NOTE — Chronic Care Management (AMB) (Signed)
°  Chronic Care Management   Note  06/09/2021 Name: Dylan Fletcher MRN: [id number] DOB: 1961-05-31  Dylan Fletcher is a 60 y.o. year old male who is a primary care patient of Ria Bush, MD. I reached out to Peggyann Shoals by phone today in response to a referral sent by Dylan Fletcher's PCP, Ria Bush, MD.   Dylan Fletcher was given information about Chronic Care Management services today including:  CCM service includes personalized support from designated clinical staff supervised by his physician, including individualized plan of care and coordination with other care providers 24/7 contact phone numbers for assistance for urgent and routine care needs. Service will only be billed when office clinical staff spend 20 minutes or more in a month to coordinate care. Only one practitioner may furnish and bill the service in a calendar month. The patient may stop CCM services at any time (effective at the end of the month) by phone call to the office staff.   Dylan Fletcher verbally agreed to assistance and services provided by embedded care coordination/care management team today.  Follow up plan:   Tatjana Secretary/administrator

## 2021-07-01 DIAGNOSIS — F909 Attention-deficit hyperactivity disorder, unspecified type: Secondary | ICD-10-CM | POA: Diagnosis not present

## 2021-07-01 DIAGNOSIS — F0632 Mood disorder due to known physiological condition with major depressive-like episode: Secondary | ICD-10-CM | POA: Diagnosis not present

## 2021-07-06 ENCOUNTER — Encounter: Payer: Self-pay | Admitting: Family Medicine

## 2021-07-06 DIAGNOSIS — Z1211 Encounter for screening for malignant neoplasm of colon: Secondary | ICD-10-CM

## 2021-07-07 DIAGNOSIS — F3341 Major depressive disorder, recurrent, in partial remission: Secondary | ICD-10-CM | POA: Diagnosis not present

## 2021-07-07 DIAGNOSIS — F411 Generalized anxiety disorder: Secondary | ICD-10-CM | POA: Diagnosis not present

## 2021-07-07 DIAGNOSIS — F0632 Mood disorder due to known physiological condition with major depressive-like episode: Secondary | ICD-10-CM | POA: Diagnosis not present

## 2021-07-07 DIAGNOSIS — I69311 Memory deficit following cerebral infarction: Secondary | ICD-10-CM | POA: Diagnosis not present

## 2021-07-07 DIAGNOSIS — Z79899 Other long term (current) drug therapy: Secondary | ICD-10-CM | POA: Diagnosis not present

## 2021-07-07 DIAGNOSIS — F909 Attention-deficit hyperactivity disorder, unspecified type: Secondary | ICD-10-CM | POA: Diagnosis not present

## 2021-07-14 DIAGNOSIS — Z952 Presence of prosthetic heart valve: Secondary | ICD-10-CM | POA: Diagnosis not present

## 2021-07-21 DIAGNOSIS — F0632 Mood disorder due to known physiological condition with major depressive-like episode: Secondary | ICD-10-CM | POA: Diagnosis not present

## 2021-07-21 DIAGNOSIS — F909 Attention-deficit hyperactivity disorder, unspecified type: Secondary | ICD-10-CM | POA: Diagnosis not present

## 2021-07-22 ENCOUNTER — Other Ambulatory Visit: Payer: Self-pay

## 2021-07-22 ENCOUNTER — Encounter: Payer: Self-pay | Admitting: Family Medicine

## 2021-07-22 ENCOUNTER — Ambulatory Visit (INDEPENDENT_AMBULATORY_CARE_PROVIDER_SITE_OTHER): Payer: Medicare Other | Admitting: Family Medicine

## 2021-07-22 VITALS — BP 122/72 | HR 71 | Temp 97.8°F | Ht 65.5 in | Wt 211.1 lb

## 2021-07-22 DIAGNOSIS — E785 Hyperlipidemia, unspecified: Secondary | ICD-10-CM | POA: Diagnosis not present

## 2021-07-22 DIAGNOSIS — I421 Obstructive hypertrophic cardiomyopathy: Secondary | ICD-10-CM | POA: Diagnosis not present

## 2021-07-22 DIAGNOSIS — E291 Testicular hypofunction: Secondary | ICD-10-CM

## 2021-07-22 DIAGNOSIS — Z8679 Personal history of other diseases of the circulatory system: Secondary | ICD-10-CM

## 2021-07-22 DIAGNOSIS — I6930 Unspecified sequelae of cerebral infarction: Secondary | ICD-10-CM

## 2021-07-22 DIAGNOSIS — F331 Major depressive disorder, recurrent, moderate: Secondary | ICD-10-CM

## 2021-07-22 DIAGNOSIS — E781 Pure hyperglyceridemia: Secondary | ICD-10-CM | POA: Diagnosis not present

## 2021-07-22 DIAGNOSIS — I10 Essential (primary) hypertension: Secondary | ICD-10-CM | POA: Diagnosis not present

## 2021-07-22 DIAGNOSIS — Z952 Presence of prosthetic heart valve: Secondary | ICD-10-CM

## 2021-07-22 DIAGNOSIS — R229 Localized swelling, mass and lump, unspecified: Secondary | ICD-10-CM

## 2021-07-22 LAB — CBC WITH DIFFERENTIAL/PLATELET
Basophils Absolute: 0.1 10*3/uL (ref 0.0–0.1)
Basophils Relative: 0.5 % (ref 0.0–3.0)
Eosinophils Absolute: 0.2 10*3/uL (ref 0.0–0.7)
Eosinophils Relative: 2 % (ref 0.0–5.0)
HCT: 42.5 % (ref 39.0–52.0)
Hemoglobin: 14.1 g/dL (ref 13.0–17.0)
Lymphocytes Relative: 49.2 % — ABNORMAL HIGH (ref 12.0–46.0)
Lymphs Abs: 4.8 10*3/uL — ABNORMAL HIGH (ref 0.7–4.0)
MCHC: 33.1 g/dL (ref 30.0–36.0)
MCV: 92.1 fl (ref 78.0–100.0)
Monocytes Absolute: 0.8 10*3/uL (ref 0.1–1.0)
Monocytes Relative: 7.9 % (ref 3.0–12.0)
Neutro Abs: 3.9 10*3/uL (ref 1.4–7.7)
Neutrophils Relative %: 40.4 % — ABNORMAL LOW (ref 43.0–77.0)
Platelets: 272 10*3/uL (ref 150.0–400.0)
RBC: 4.61 Mil/uL (ref 4.22–5.81)
RDW: 13.7 % (ref 11.5–15.5)
WBC: 9.7 10*3/uL (ref 4.0–10.5)

## 2021-07-22 LAB — LIPID PANEL
Cholesterol: 133 mg/dL (ref 0–200)
HDL: 40.6 mg/dL (ref >39.00)
LDL Cholesterol: 69 mg/dL (ref 0–99)
NonHDL: 92.82
Total CHOL/HDL Ratio: 3
Triglycerides: 120 mg/dL (ref 0.0–149.0)
VLDL: 24 mg/dL (ref 0.0–40.0)

## 2021-07-22 LAB — RENAL FUNCTION PANEL
Albumin: 4.6 g/dL (ref 3.5–5.2)
BUN: 21 mg/dL (ref 6–23)
CO2: 33 mEq/L — ABNORMAL HIGH (ref 19–32)
Calcium: 9.7 mg/dL (ref 8.4–10.5)
Chloride: 103 mEq/L (ref 96–112)
Creatinine, Ser: 1.42 mg/dL (ref 0.40–1.50)
GFR: 52.98 mL/min — ABNORMAL LOW (ref >60.00)
Glucose, Bld: 94 mg/dL (ref 70–99)
Phosphorus: 2.4 mg/dL (ref 2.3–4.6)
Potassium: 4.5 mEq/L (ref 3.5–5.1)
Sodium: 139 mEq/L (ref 135–145)

## 2021-07-22 LAB — TSH: TSH: 0.74 u[IU]/mL (ref 0.35–5.50)

## 2021-07-22 NOTE — Assessment & Plan Note (Signed)
Update FLP on fenofibrate and rosuvastatin 10mg  nightly.  The ASCVD Risk score (Arnett DK, et al., 2019) failed to calculate for the following reasons:   The patient has a prior MI or stroke diagnosis

## 2021-07-22 NOTE — Assessment & Plan Note (Addendum)
Update FLP on statin and fibrate.

## 2021-07-22 NOTE — Assessment & Plan Note (Signed)
Chronic, stable on current regimen - continue. 

## 2021-07-22 NOTE — Assessment & Plan Note (Signed)
R anterior thigh - anticipate benign scarred nodule - will monitor for now, update if changing.

## 2021-07-22 NOTE — Assessment & Plan Note (Signed)
Appreciate endo care.  

## 2021-07-22 NOTE — Patient Instructions (Addendum)
Labs today. Next visit we will be back at [facility].  Good to see you today.  Return as needed or in 6 months for physical/wellness visit.

## 2021-07-22 NOTE — Assessment & Plan Note (Signed)
Sees psychiatrist, recently re established with counselor. Recent light treatment at home has significantly helped

## 2021-07-22 NOTE — Progress Notes (Signed)
Patient ID: Dylan Fletcher, male    DOB: 29-Dec-1960, 61 y.o.   MRN: [id number]  This visit was conducted in person.  BP 122/72    Pulse 71    Temp 97.8 F (36.6 C) (Temporal)    Ht 5' 5.5" (1.664 m)    Wt 211 lb 1 oz (95.7 kg)    SpO2 99%    BMI 34.59 kg/m    CC: 6 mo f/u visit  Subjective:   HPI: Dylan Fletcher is a 61 y.o. male presenting on 07/22/2021 for Follow-up (Here for 6 mo f/u.  Pt accompanied by wife, [name]. )   Colonoscopy was planned last month, cancelled and instead gastroenterologist recommended proceeding with Cologuard - he received box but hasn't yet completed.   Known HOCM, LBBB, Pafib, s/p ventricular septal myectomy and MVR followed by [facility] cardiology. Had nuclear treadmill stress test and echocardiogram 03/2021 - no ischemia, EF 60%, mild-mod dilated LA.   Saw endo Dr Honor Junes for hypotestosteronism with stable evaluation.   Deterioration of depression - sees psychiatry and recently restarted counseling (LCSW). Recently started light therapy (10,000 lux) at home which seems to be helping with depression and inattention.   Wants lump to R anterior thigh checked. No pain or itching. Present for the past 3-4 months. Denies inciting trauma/injury or falls.       Relevant past medical, surgical, family and social history reviewed and updated as indicated. Interim medical history since our last visit reviewed. Allergies and medications reviewed and updated. Outpatient Medications Prior to Visit  Medication Sig Dispense Refill   acetaminophen (TYLENOL) 500 MG tablet Take 1 tablet (500 mg total) by mouth 2 (two) times daily.     Acetaminophen-Codeine 300-30 MG tablet TAKE 1 TABLET BY MOUTH 3 (THREE) TIMES DAILY AS NEEDED FOR MODERATE PAIN (HEADACHE). 15 tablet 0   amoxicillin (AMOXIL) 500 MG tablet Take 500 mg by mouth as directed. Takes 4 tablets 1 hour prior to dental procedures     amphetamine-dextroamphetamine (ADDERALL XR) 20 MG 24 hr capsule Take 20 mg by  mouth daily. Takes 2 tablets (40 mg) every morning     amphetamine-dextroamphetamine (ADDERALL) 30 MG tablet Take 30 mg by mouth daily. Takes in the afternoon  0   buPROPion (WELLBUTRIN XL) 150 MG 24 hr tablet Take 1 tablet by mouth daily.     cetirizine (ZYRTEC) 10 MG tablet Take 10 mg by mouth daily as needed.      famotidine (PEPCID) 20 MG tablet Take 20 mg by mouth daily.     fenofibrate (TRICOR) 145 MG tablet TAKE 1 TABLET BY MOUTH EVERY DAY 30 tablet 11   furosemide (LASIX) 20 MG tablet Take 1 tablet (20 mg total) by mouth daily. With extra prn weight gain     Glucosamine-Vitamin D 1000-200 MG-UNIT TABS Take 2 tablets by mouth daily.     liothyronine (CYTOMEL) 5 MCG tablet Take 2 tablets by mouth daily.     metoprolol succinate (TOPROL-XL) 25 MG 24 hr tablet Take 25 mg by mouth daily. Takes 2 tablets for excessive palpitations     Multiple Vitamin (MULTIVITAMIN) tablet Take 1 tablet by mouth daily.     oxyCODONE-acetaminophen (PERCOCET/ROXICET) 5-325 MG per tablet Take 1 tablet by mouth every 4 (four) hours as needed for severe pain.     rosuvastatin (CRESTOR) 10 MG tablet Take 10 mg by mouth at bedtime.     sertraline (ZOLOFT) 50 MG tablet Take 50 mg by mouth daily.  sildenafil (VIAGRA) 100 MG tablet Take 1 tablet (100 mg total) by mouth daily as needed for erectile dysfunction. 10 tablet 3   warfarin (COUMADIN) 5 MG tablet Take 5 mg by mouth as directed.      No facility-administered medications prior to visit.     Per HPI unless specifically indicated in ROS section below Review of Systems  Objective:  BP 122/72    Pulse 71    Temp 97.8 F (36.6 C) (Temporal)    Ht 5' 5.5" (1.664 m)    Wt 211 lb 1 oz (95.7 kg)    SpO2 99%    BMI 34.59 kg/m   Wt Readings from Last 3 Encounters:  07/22/21 211 lb 1 oz (95.7 kg)  01/19/21 209 lb 1 oz (94.8 kg)  07/21/20 210 lb 1 oz (95.3 kg)      Physical Exam Vitals and nursing note reviewed.  Constitutional:      Appearance: Normal  appearance. He is obese. He is not ill-appearing.  Cardiovascular:     Rate and Rhythm: Normal rate and regular rhythm.     Pulses: Normal pulses.     Heart sounds: Murmur (systolic click best at apex) heard.  Pulmonary:     Effort: Pulmonary effort is normal. No respiratory distress.     Breath sounds: Normal breath sounds. No wheezing, rhonchi or rales.  Musculoskeletal:        General: Normal range of motion.     Right lower leg: No edema.     Left lower leg: No edema.  Skin:    General: Skin is warm and dry.     Findings: No rash.     Comments: Small firm nodule <1cm anterior R thigh  Neurological:     Mental Status: He is alert.  Psychiatric:        Mood and Affect: Mood normal.        Behavior: Behavior normal.      Results for orders placed or performed in visit on 01/12/21  CBC with Differential/Platelet  Result Value Ref Range   WBC 8.4 4.0 - 10.5 K/uL   RBC 4.64 4.22 - 5.81 Mil/uL   Hemoglobin 14.2 13.0 - 17.0 g/dL   HCT 42.6 39.0 - 52.0 %   MCV 91.8 78.0 - 100.0 fl   MCHC 33.4 30.0 - 36.0 g/dL   RDW 14.0 11.5 - 15.5 %   Platelets 285.0 150.0 - 400.0 K/uL   Neutrophils Relative % 42.9 (L) 43.0 - 77.0 %   Lymphocytes Relative 43.1 12.0 - 46.0 %   Monocytes Relative 11.1 3.0 - 12.0 %   Eosinophils Relative 2.1 0.0 - 5.0 %   Basophils Relative 0.8 0.0 - 3.0 %   Neutro Abs 3.6 1.4 - 7.7 K/uL   Lymphs Abs 3.6 0.7 - 4.0 K/uL   Monocytes Absolute 0.9 0.1 - 1.0 K/uL   Eosinophils Absolute 0.2 0.0 - 0.7 K/uL   Basophils Absolute 0.1 0.0 - 0.1 K/uL  PSA, Medicare  Result Value Ref Range   PSA 0.77 0.10 - 4.00 ng/ml  VITAMIN D 25 Hydroxy (Vit-D Deficiency, Fractures)  Result Value Ref Range   VITD 54.43 30.00 - 100.00 ng/mL  Comprehensive metabolic panel  Result Value Ref Range   Sodium 138 135 - 145 mEq/L   Potassium 4.5 3.5 - 5.1 mEq/L   Chloride 106 96 - 112 mEq/L   CO2 26 19 - 32 mEq/L   Glucose, Bld 103 (H) 70 - 99 mg/dL  BUN 20 6 - 23 mg/dL    Creatinine, Ser 1.27 0.40 - 1.50 mg/dL   Total Bilirubin 0.4 0.2 - 1.2 mg/dL   Alkaline Phosphatase 30 (L) 39 - 117 U/L   AST 29 0 - 37 U/L   ALT 24 0 - 53 U/L   Total Protein 6.6 6.0 - 8.3 g/dL   Albumin 4.3 3.5 - 5.2 g/dL   GFR 60.80 >60.00 mL/min   Calcium 8.9 8.4 - 10.5 mg/dL  Lipid panel  Result Value Ref Range   Cholesterol 139 0 - 200 mg/dL   Triglycerides 78.0 0.0 - 149.0 mg/dL   HDL 43.40 >39.00 mg/dL   VLDL 15.6 0.0 - 40.0 mg/dL   LDL Cholesterol 80 0 - 99 mg/dL   Total CHOL/HDL Ratio 3    NonHDL 95.89   Microalbumin / creatinine urine ratio  Result Value Ref Range   Microalb, Ur <0.7 0.0 - 1.9 mg/dL   Creatinine,U 167.4 mg/dL   Microalb Creat Ratio 0.4 0.0 - 30.0 mg/g    Assessment & Plan:  This visit occurred during the SARS-CoV-2 public health emergency.  Safety protocols were in place, including screening questions prior to the visit, additional usage of staff PPE, and extensive cleaning of exam room while observing appropriate contact time as indicated for disinfecting solutions.   Problem List Items Addressed This Visit     Hypertrophic obstructive cardiomyopathy ([place])   Dyslipidemia - Primary    Update FLP on fenofibrate and rosuvastatin 10mg  nightly.  The ASCVD Risk score (Arnett DK, et al., 2019) failed to calculate for the following reasons:   The patient has a prior MI or stroke diagnosis       Relevant Orders   Lipid panel   TSH   HTN (hypertension)    Chronic, stable on current regimen - continue.       Relevant Orders   Renal function panel   History of atrial fibrillation   Relevant Orders   CBC with Differential/Platelet   TSH   H/O mitral valve replacement   History of cerebrovascular accident (CVA) with residual deficit   Hypogonadism in male    Appreciate endo care.       Hypertriglyceridemia    Update FLP on statin and fibrate.       MDD (major depressive disorder), recurrent episode, moderate ([place])    Sees psychiatrist,  recently re established with counselor. Recent light treatment at home has significantly helped      Skin nodule    R anterior thigh - anticipate benign scarred nodule - will monitor for now, update if changing.         No orders of the defined types were placed in this encounter.  Orders Placed This Encounter  Procedures   Lipid panel   CBC with Differential/Platelet   Renal function panel   TSH     Patient Instructions  Labs today. Next visit we will be back at [facility].  Good to see you today.  Return as needed or in 6 months for physical/wellness visit.   Follow up plan: Return in about 6 months (around 01/19/2022) for medicare wellness visit.  Ria Bush, MD

## 2021-07-25 ENCOUNTER — Telehealth: Payer: Self-pay

## 2021-07-25 NOTE — Chronic Care Management (AMB) (Signed)
Chronic Care Management Pharmacy Assistant   Name: Dylan Fletcher  MRN: [id number] DOB: 1960-06-22  Dylan Fletcher is an 61 y.o. year old male who presents for his initial CCM visit with the clinical pharmacist.  Reason for Encounter: Initial Questions   Conditions to be addressed/monitored: HTN and HLD    Recent office visits:  07/22/21-PCP-Javier Gutierrez,MD-Patient presented for 6 month follow up.Labs ordered,(Your blood counts returned normal. Your sugar and thyroid returned normal. Your kidney function returned mildly impaired - ensure you're staying well hydrated with good water intake.)    R anterior thigh - anticipate benign scarred nodule - will monitor for now, update if changing.       Recent consult visits:  07/14/21-Cardiology-Ashley Lewis,MD-Patient presented for coumadin check-Recommendation: Continue warfarin 2.5mg  Mon, Thu, Sat and 5mg  all other days 04/15/21-[facility]-Procedure-  ECHOCARDIOGRAM W COLORFLOW SPECTRAL DOPPLER 04/07/21-Cardiology-Ashely Lewis,MD-Patient presented for follow up shortness of breath.Referral for treadmill nuclear. 02/16/21-Endocrinology-Thomas O'Connell,MD-patient presented for follow up hypogonadism.Testosterone from last week was great. Stay off medicine.Follow up 1 year.  Hospital visits:  None in previous 6 months  Medications: Outpatient Encounter Medications as of 07/25/2021  Medication Sig   acetaminophen (TYLENOL) 500 MG tablet Take 1 tablet (500 mg total) by mouth 2 (two) times daily.   Acetaminophen-Codeine 300-30 MG tablet TAKE 1 TABLET BY MOUTH 3 (THREE) TIMES DAILY AS NEEDED FOR MODERATE PAIN (HEADACHE).   amoxicillin (AMOXIL) 500 MG tablet Take 500 mg by mouth as directed. Takes 4 tablets 1 hour prior to dental procedures   amphetamine-dextroamphetamine (ADDERALL XR) 20 MG 24 hr capsule Take 20 mg by mouth daily. Takes 2 tablets (40 mg) every morning   amphetamine-dextroamphetamine (ADDERALL) 30 MG tablet Take 30  mg by mouth daily. Takes in the afternoon   buPROPion (WELLBUTRIN XL) 150 MG 24 hr tablet Take 1 tablet by mouth daily.   cetirizine (ZYRTEC) 10 MG tablet Take 10 mg by mouth daily as needed.    famotidine (PEPCID) 20 MG tablet Take 20 mg by mouth daily.   fenofibrate (TRICOR) 145 MG tablet TAKE 1 TABLET BY MOUTH EVERY DAY   furosemide (LASIX) 20 MG tablet Take 1 tablet (20 mg total) by mouth daily. With extra prn weight gain   Glucosamine-Vitamin D 1000-200 MG-UNIT TABS Take 2 tablets by mouth daily.   liothyronine (CYTOMEL) 5 MCG tablet Take 2 tablets by mouth daily.   metoprolol succinate (TOPROL-XL) 25 MG 24 hr tablet Take 25 mg by mouth daily. Takes 2 tablets for excessive palpitations   Multiple Vitamin (MULTIVITAMIN) tablet Take 1 tablet by mouth daily.   oxyCODONE-acetaminophen (PERCOCET/ROXICET) 5-325 MG per tablet Take 1 tablet by mouth every 4 (four) hours as needed for severe pain.   rosuvastatin (CRESTOR) 10 MG tablet Take 10 mg by mouth at bedtime.   sertraline (ZOLOFT) 50 MG tablet Take 50 mg by mouth daily.   sildenafil (VIAGRA) 100 MG tablet Take 1 tablet (100 mg total) by mouth daily as needed for erectile dysfunction.   warfarin (COUMADIN) 5 MG tablet Take 5 mg by mouth as directed.    No facility-administered encounter medications on file as of 07/25/2021.     Lab Results  Component Value Date/Time   HGBA1C 5.4 06/09/2016 08:23 AM   MICROALBUR <0.7 01/12/2021 08:14 AM   MICROALBUR <0.7 01/14/2020 08:43 AM     BP Readings from Last 3 Encounters:  07/22/21 122/72  01/19/21 130/80  07/21/20 122/74    Patient contacted to review initial questions  prior to visit with Charlene Brooke. Spoke with the wife Dylan Fletcher due to the patient has some memory issues.  Have you seen any other providers since your last visit with PCP? No  Any changes in your medications or health? No  Any side effects from any medications? No  Do you have an symptoms or problems not managed by  your medications? No  Any concerns about your health right now? No  Has your provider asked that you check blood pressure, blood sugar, or follow special diet at home? Yes  The patient has BP monitor at home   Do you get any type of exercise on a regular basis? Yes  Wife reports the patient plays disc golf  Can you think of a goal you would like to reach for your health? No  Do you have any problems getting your medications? No  Is there anything that you would like to discuss during the appointment? No   Spoke with patient and reminded them to have all medications, supplements and any blood glucose and blood pressure readings available for review with pharmacist, at their telephone visit on 07/28/21 at 2:00pm.   Star Rating Drugs:  Medication:  Last Fill: Day Supply Rosuvastatin 10mg  06/06/21 90   Care Gaps: Annual wellness visit in last year? Yes Most Recent BP reading:122/72  71-P  07/22/21   Marjo Bicker  CPP notified  Avel Sensor, [place] Assistant [phone]  Total time spent for month CPA: 40 min.

## 2021-07-28 ENCOUNTER — Ambulatory Visit (INDEPENDENT_AMBULATORY_CARE_PROVIDER_SITE_OTHER): Payer: Medicare Other | Admitting: Pharmacist

## 2021-07-28 ENCOUNTER — Other Ambulatory Visit: Payer: Self-pay

## 2021-07-28 DIAGNOSIS — I10 Essential (primary) hypertension: Secondary | ICD-10-CM

## 2021-07-28 DIAGNOSIS — I6930 Unspecified sequelae of cerebral infarction: Secondary | ICD-10-CM

## 2021-07-28 DIAGNOSIS — F331 Major depressive disorder, recurrent, moderate: Secondary | ICD-10-CM

## 2021-07-28 DIAGNOSIS — E785 Hyperlipidemia, unspecified: Secondary | ICD-10-CM

## 2021-07-28 DIAGNOSIS — E781 Pure hyperglyceridemia: Secondary | ICD-10-CM

## 2021-07-28 DIAGNOSIS — Z952 Presence of prosthetic heart valve: Secondary | ICD-10-CM

## 2021-07-28 DIAGNOSIS — Z8679 Personal history of other diseases of the circulatory system: Secondary | ICD-10-CM

## 2021-07-28 NOTE — Progress Notes (Signed)
° °Chronic Care Management °Pharmacy Note ° °07/28/2021 °Name:  Dylan Fletcher MRN:  [id number] DOB:  07/17/1960 ° °Summary: °-CCM initial visit: pt endorses compliance with medications, he uses pill box as a reminder to take meds °-Pt has good grasp of warfarin food/drug interactions and has been successful with home INR for some time ° °Recommendations/Changes made from today's visit: °-No med changes ° °Plan: °-CCM Health Concierge will call patient 3 months for general adherence °-Pharmacist follow up televisit scheduled for 9 months °-PCP 6-month f/u 01/20/22 ° ° °Subjective: °Tremond Loper is an 61 y.o. year old male who is a primary patient of Gutierrez, Javier, MD.  The CCM team was consulted for assistance with disease management and care coordination needs.   ° °Engaged with patient by telephone for initial visit in response to provider referral for pharmacy case management and/or care coordination services.  ° °Consent to Services:  °The patient was given the following information about Chronic Care Management services today, agreed to services, and gave verbal consent: 1. CCM service includes personalized support from designated clinical staff supervised by the primary care provider, including individualized plan of care and coordination with other care providers 2. 24/7 contact phone numbers for assistance for urgent and routine care needs. 3. Service will only be billed when office clinical staff spend 20 minutes or more in a month to coordinate care. 4. Only one practitioner may furnish and bill the service in a calendar month. 5.The patient may stop CCM services at any time (effective at the end of the month) by phone call to the office staff. 6. The patient will be responsible for cost sharing (co-pay) of up to 20% of the service fee (after annual deductible is met). Patient agreed to services and consent obtained. ° °Patient Care Team: °Gutierrez, Javier, MD as PCP - General (Family  Medicine) °Shah, Sidharth, MD (Cardiology) °Wu, Willis M, MD (Inactive) (Cardiology) °Foltanski, Lindsey N, RPH as Pharmacist (Pharmacist) ° °Patient lives at home with his wife. Uses pill box to sort medications. ° °Recent office visits: °07/22/21-PCP-Javier Gutierrez,MD-Patient presented for 6 month follow up.Labs ordered,(Per PCP: Your blood counts returned normal. Your sugar and thyroid returned normal. Your kidney function returned mildly impaired - ensure you're staying well hydrated with good water intake.) ° °07/06/21 - PCP ordered Cologuard. ° °Recent consult visits: °07/14/21-Cardiology-Ashley Lewis,MD-Anticoag °04/15/21-[facility]-Procedure-  ECHOCARDIOGRAM W COLORFLOW SPECTRAL DOPPLER °04/07/21-Cardiology-Ashely Lewis,MD-Patient presented for follow up shortness of breath.Referral for treadmill nuclear - no ischemia, EF 60%, mild-mod dilated LA.  °02/16/21-Endocrinology-Thomas O'Connell,MD-patient presented for follow up hypogonadism.Testosterone from last week was great. Stay off medicine.Follow up 1 year ° °Hospital visits: °None in previous 6 months ° ° °Objective: ° °Lab Results  °Component Value Date  ° CREATININE 1.42 07/22/2021  ° BUN 21 07/22/2021  ° GFR 52.98 (L) 07/22/2021  ° NA 139 07/22/2021  ° K 4.5 07/22/2021  ° CALCIUM 9.7 07/22/2021  ° CO2 33 (H) 07/22/2021  ° GLUCOSE 94 07/22/2021  ° ° °Lab Results  °Component Value Date/Time  ° HGBA1C 5.4 06/09/2016 08:23 AM  ° GFR 52.98 (L) 07/22/2021 11:07 AM  ° GFR 60.80 01/12/2021 08:14 AM  ° MICROALBUR <0.7 01/12/2021 08:14 AM  ° MICROALBUR <0.7 01/14/2020 08:43 AM  °  °Last diabetic Eye exam: No results found for: HMDIABEYEEXA  °Last diabetic Foot exam: No results found for: HMDIABFOOTEX  ° °Lab Results  °Component Value Date  ° CHOL 133 07/22/2021  ° HDL 40.60 07/22/2021  ° LDLCALC 69   69 07/22/2021   LDLDIRECT 96.0 01/14/2020   TRIG 120.0 07/22/2021   CHOLHDL 3 07/22/2021    Hepatic Function Latest Ref Rng & Units 07/22/2021 01/12/2021 07/16/2020   Total Protein 6.0 - 8.3 g/dL - 6.6 6.7  Albumin 3.5 - 5.2 g/dL 4.6 4.3 4.4  AST 0 - 37 U/L - 29 32  ALT 0 - 53 U/L - 24 29  Alk Phosphatase 39 - 117 U/L - 30(L) 33(L)  Total Bilirubin 0.2 - 1.2 mg/dL - 0.4 0.5    Lab Results  Component Value Date/Time   TSH 0.74 07/22/2021 11:07 AM   TSH 0.94 06/21/2018 08:44 AM   FREET4 0.86 06/02/2015 11:07 AM   FREET4 0.89 12/02/2014 08:52 AM    CBC Latest Ref Rng & Units 07/22/2021 01/12/2021 07/16/2020  WBC 4.0 - 10.5 K/uL 9.7 8.4 9.7  Hemoglobin 13.0 - 17.0 g/dL 14.1 14.2 14.7  Hematocrit 39.0 - 52.0 % 42.5 42.6 43.0  Platelets 150.0 - 400.0 K/uL 272.0 285.0 273.0    Lab Results  Component Value Date/Time   VD25OH 54.43 01/12/2021 08:14 AM   VD25OH 54.99 07/16/2020 08:03 AM    Clinical ASCVD: Yes  The ASCVD Risk score (Arnett DK, et al., 2019) failed to calculate for the following reasons:   The patient has a prior MI or stroke diagnosis    Depression screen [facility] 2/9 01/19/2021 11/10/2019 06/24/2018  Decreased Interest 0 0 0  Down, Depressed, Hopeless 0 0 0  PHQ - 2 Score 0 0 0  Altered sleeping 0 0 -  Tired, decreased energy 3 0 -  Change in appetite 0 0 -  Feeling bad or failure about yourself  1 0 -  Trouble concentrating 3 0 -  Moving slowly or fidgety/restless 3 0 -  Suicidal thoughts 0 0 -  PHQ-9 Score 10 0 -  Difficult doing work/chores - Not difficult at all -     CHA2DS2/VAS Stroke Risk Points      N/A >= 2 Points: High Risk  1 - 1.99 Points: Medium Risk  0 Points: Low Risk    Last Change: N/A      This score determines the patient's risk of having a stroke if the  patient has atrial fibrillation.   Social History   Tobacco Use  Smoking Status Never  Smokeless Tobacco Never   BP Readings from Last 3 Encounters:  07/22/21 122/72  01/19/21 130/80  07/21/20 122/74   Pulse Readings from Last 3 Encounters:  07/22/21 71  01/19/21 76  07/21/20 70   Wt Readings from Last 3 Encounters:  07/22/21 211 lb 1 oz  (95.7 kg)  01/19/21 209 lb 1 oz (94.8 kg)  07/21/20 210 lb 1 oz (95.3 kg)   BMI Readings from Last 3 Encounters:  07/22/21 34.59 kg/m  01/19/21 34.26 kg/m  07/21/20 34.42 kg/m    Assessment/Interventions: Review of patient past medical history, allergies, medications, health status, including review of consultants reports, laboratory and other test data, was performed as part of comprehensive evaluation and provision of chronic care management services.   SDOH:  (Social Determinants of Health) assessments and interventions performed: Yes SDOH Interventions    Flowsheet Row Most Recent Value  SDOH Interventions   Food Insecurity Interventions Intervention Not Indicated  Financial Strain Interventions Intervention Not Indicated      SDOH Screenings   Alcohol Screen: Not on file  Depression (PHQ2-9): Medium Risk   PHQ-2 Score: 10  Financial Resource Strain: Low Risk  Difficulty of Paying Living Expenses: Not hard at all  Food Insecurity: No Food Insecurity   Worried About Charity fundraiser in the Last Year: Never true   Ran Out of Food in the Last Year: Never true  Housing: Not on file  Physical Activity: Not on file  Social Connections: Not on file  Stress: Not on file  Tobacco Use: Low Risk    Smoking Tobacco Use: Never   Smokeless Tobacco Use: Never   Passive Exposure: Not on file  Transportation Needs: Not on file    [place]  Allergies  Allergen Reactions   Nicardipine Hcl Shortness Of Breath    Shortness of breath   Nitroglycerin Anaphylaxis   Calcium Channel Blockers Other (See Comments)    Shortness of breath   Molds & Smuts Other (See Comments)    Ragweed, pollen, mold, etc.   Shellfish Allergy Nausea And Vomiting   Tape Rash    Medications Reviewed Today     Reviewed by Charlton Haws, [facility] (Pharmacist) on 07/28/21 at 1447  Med List Status: <None>   Medication Order Taking? Sig Documenting Provider Last Dose Status Informant   acetaminophen (TYLENOL) 500 MG tablet [id number] Yes Take 1 tablet (500 mg total) by mouth 2 (two) times daily. Ria Bush, MD Taking Active   Acetaminophen-Codeine 300-30 MG tablet [id number] Yes TAKE 1 TABLET BY MOUTH 3 (THREE) TIMES DAILY AS NEEDED FOR MODERATE PAIN (HEADACHE). Ria Bush, MD Taking Active   amoxicillin (AMOXIL) 500 MG tablet [id number] Yes Take 500 mg by mouth as directed. Takes 4 tablets 1 hour prior to dental procedures [provider] Taking Active   amphetamine-dextroamphetamine (ADDERALL XR) 20 MG 24 hr capsule [id number] Yes Take 20 mg by mouth daily. Takes 2 tablets (40 mg) every morning [provider] Taking Active Self  amphetamine-dextroamphetamine (ADDERALL) 30 MG tablet [id number] Yes Take 30 mg by mouth daily. Takes in the afternoon Ria Bush, MD Taking Active   buPROPion (WELLBUTRIN XL) 150 MG 24 hr tablet [id number] Yes Take 1 tablet by mouth daily. [provider] Taking Active   cetirizine (ZYRTEC) 10 MG tablet [id number] Yes Take 10 mg by mouth daily as needed.  [provider] Taking Active   famotidine (PEPCID) 20 MG tablet [id number] Yes Take 20 mg by mouth daily. [provider] Taking Active   fenofibrate (TRICOR) 145 MG tablet [id number] Yes TAKE 1 TABLET BY MOUTH EVERY DAY Ria Bush, MD Taking Active   furosemide (LASIX) 20 MG tablet [id number] Yes Take 1 tablet (20 mg total) by mouth daily. With extra prn weight gain Ria Bush, MD Taking Active   Glucosamine-Vitamin D 1000-200 MG-UNIT TABS [id number] Yes Take 2 tablets by mouth daily. Ria Bush, MD Taking Active   liothyronine (CYTOMEL) 5 MCG tablet [id number] Yes Take 2 tablets by mouth daily. [provider] Taking Active   metoprolol succinate (TOPROL-XL) 25 MG 24 hr tablet [id number] Yes Take 25 mg by mouth daily. Takes 2 tablets for excessive palpitations Ria Bush, MD Taking Active   Multiple Vitamin  (MULTIVITAMIN) tablet [id number] Yes Take 1 tablet by mouth daily. [provider] Taking Active   rosuvastatin (CRESTOR) 10 MG tablet [id number] Yes Take 10 mg by mouth at bedtime. [provider] Taking Active   sertraline (ZOLOFT) 25 MG tablet [id number] Yes Take 25 mg by mouth daily. [provider] Taking Active   sildenafil (VIAGRA) 100 MG tablet [id number] Yes Take 1 tablet (100 mg total) by mouth  daily as needed for erectile dysfunction. Gutierrez, Javier, MD Taking Active   °warfarin (COUMADIN) 5 MG tablet [id number] Yes Take 5 mg by mouth as directed.  [provider] Taking Active   ° °  °  ° °  ° ° °Patient Active Problem List  ° Diagnosis Date Noted  ° Skin nodule 07/22/2021  ° MDD (major depressive disorder), recurrent episode, moderate (HCC) 05/14/2019  ° Paresthesia 03/07/2019  ° Vertigo 03/07/2019  ° Hypertriglyceridemia 10/25/2018  ° Medicare annual wellness visit, subsequent 06/24/2018  ° Advanced directives, counseling/discussion 06/24/2018  ° Chronic pain of left thumb 03/19/2018  ° Aneurysm of basilar artery (HCC) 05/17/2017  ° Cognitive deficit as late effect of cerebrovascular accident (CVA) 12/23/2016  ° Hypogonadism in male 07/30/2016  ° Meniere disease, left 06/02/2015  ° PVD (posterior vitreous detachment), left eye 03/19/2015  ° Globus sensation 12/02/2014  ° Transient alteration of awareness 05/19/2014  ° History of adenomatous polyp of colon 03/10/2014  ° Chronic fatigue 12/18/2013  ° Aphasia as late effect of stroke 09/12/2013  ° History of cerebrovascular accident (CVA) with residual deficit 09/12/2013  ° Warfarin anticoagulation   ° H/O mitral valve replacement 08/17/2013  ° LBBB (left bundle branch block) 08/17/2013  ° History of atrial fibrillation   ° ED (erectile dysfunction)   ° Obesity, Class I, BMI 30-34.9   ° Vitamin D deficiency   ° Hypertrophic obstructive cardiomyopathy (HCC)   ° OSA (obstructive sleep apnea)   ° Migraine with aura   °  Dyslipidemia   ° HTN (hypertension)   ° Allergic rhinitis   ° Anxiety associated with depression   ° ° °Immunization History  °Administered Date(s) Administered  ° Influenza,inj,Quad PF,6+ Mos 03/19/2015, 03/17/2016, 03/19/2018, 04/04/2019  ° Influenza,inj,quad, With Preservative 03/19/2016  ° Influenza-Unspecified 03/19/2014, 03/16/2017  ° Moderna Sars-Covid-2 Vaccination 07/15/2019, 09/22/2019  ° Pneumococcal Polysaccharide-23 03/19/2015  ° Tdap 02/06/2013, 11/29/2019  ° ° °Conditions to be addressed/monitored:  °Hypertension, Hyperlipidemia, Atrial Fibrillation, GERD, Depression, and Anxiety, Cognitive impairment ° °Care Plan : CCM Pharmacy Care Plan  °Updates made by Foltanski, Lindsey N, RPH since 07/28/2021 12:00 AM  °  ° °Problem: Hypertension, Hyperlipidemia, Atrial Fibrillation, GERD, Depression, and Anxiety, Cognitive impairment   °Priority: High  °  ° °Long-Range Goal: Disease mgmt   °Start Date: 07/28/2021  °Expected End Date: 07/28/2022  °This Visit's Progress: On track  °Priority: High  °Note:   °Current Barriers:  °Unable to independently monitor therapeutic efficacy ° °Pharmacist Clinical Goal(s):  °Patient will achieve adherence to monitoring guidelines and medication adherence to achieve therapeutic efficacy through collaboration with PharmD and provider.  ° °Interventions: °1:1 collaboration with Gutierrez, Javier, MD regarding development and update of comprehensive plan of care as evidenced by provider attestation and co-signature °Inter-disciplinary care team collaboration (see longitudinal plan of care) °Comprehensive medication review performed; medication list updated in electronic medical record ° °Hypertension (BP goal <130/80) °-Controlled - BP is at goal in clinic; pt endorses compliance with medications;  he is taking furosemide for Meniere's disease, almost never has to take an extra dose for weight gain/swelling °-Hx Meniere's disease; pt limits to 1000-1200 mg sodium per day °-Current  treatment: °Metoprolol succinate 25 daily - Appropriate, Effective, Safe, Accessible °Furosemide 20 mg daily AM - Appropriate, Effective, Safe, Accessible °-Denies hypotensive/hypertensive symptoms °-Educated on BP goals and benefits of medications for prevention of heart attack, stroke and kidney damage; °-Counseled to monitor BP at home periodically °-Recommended to continue current medication ° °Hyperlipidemia: (LDL goal <   70) °-Controlled - LDL is at goal; Trig previously >800, now 120 °-Hx CVA 2015 w/ chronic cognitive impairment °-Hx hypertrophic obstructive cardiomyopathy °-Current treatment: °Rosuvastatin 10 mg daily HS -Appropriate, Effective, Safe, Accessible °Fenofibrate 145 mg daily -Appropriate, Effective, Safe, Accessible °-Educated on Cholesterol goals; Benefits of statin for ASCVD risk reduction; °-Recommended to continue current medication ° °Atrial Fibrillation (Goal: prevent stroke and major bleeding) °-Controlled °-CHADSVASC: 2; hx MVR ([facility] 2015, INR goal 2.5-3.5); he has Lovenox at home if needed for bridging  °-Pt does home INR every 2 weeks, follows with CPP at cardiology °-Current treatment: °Metoprolol succinate 25 mg daily HS -Appropriate, Effective, Safe, Accessible °Warfarin 5 mg AD -Appropriate, Effective, Safe, Accessible °-Counseled on avoidance of NSAIDs due to increased bleeding risk with anticoagulants; importance of regular laboratory monitoring; °-Recommended to continue current medication ° °Depression/Anxiety (Goal: manage symptoms) °-Controlled - pt reports he was started on liothyronine off label to help with alertness, mood; recent TSH was WNL °-PHQ9: 10 (01/2021) - moderate depression °-GAD7: 3 (01/2021) - minimal anxiety °-Connected with Psychiatry for mental health support; recently started light therapy at home and pt reports it is helping °-Current treatment: °Sertraline 25 mg daily -Appropriate, Effective, Safe, Accessible °Bupropion XL 150 mg daily -Appropriate,  Effective, Safe, Accessible °Liothyronine 5 mcg - 2 tablets daily -Appropriate, Query Effective, Safe, Accessible °-Medications previously tried/failed: alprazolam, escitalopram °-Educated on Benefits of medication for symptom control °-Discussed his antidepressants are low doses, if he is not getting benefit he can contact prescriber for dose increase °-Recommended to continue current medication ° °Cognitive deficity d/t stroke (Goal: manage symptoms) °-Controlled - pt is aware of risks with stimulants; he reports his cardiologist and [facility] cardiologists are aware he is on them °-Takes stimulants for "wakefulness" °-Current treatment  °Adderall XR 20 mg - 2 tab AM - Appropriate, Effective, Safe, Accessible °Adderall 30 mg daily PM - Appropriate, Effective, Safe, Accessible °-Recommended to continue current medication ° °Thumb pain (Goal: manage pain) °-Controlled - pt is aware he needs to avoid NSAIDs °-wears compression gloves at night which helps °-Current treatment  °Tylenol 500 mg BID -Appropriate, Effective, Safe, Accessible °Tylenol No. 3 PRN - for headaches-Appropriate, Effective, Safe, Accessible °Glucosamine-Vitamin D -Appropriate, Effective, Safe, Accessible °-Recommended to continue current medication ° °GERD (Goal: manage symptoms) °-Controlled °-Current treatment  °Famotidine 20 mg daily HS -Appropriate, Effective, Safe, Accessible °-Recommended to continue current medication ° °Health Maintenance °-Vaccine gaps: Shingrix, Covid booster, Flu °-Hx Meniere's disease °-Current therapy:  °Cetirizine 10 mg daily PRN °Multivitamin w/o vitamin K °Sildenafil 100 mg PRN ED °-Patient is satisfied with current therapy and denies issues °-Recommended to continue current medication ° °Patient Goals/Self-Care Activities °Patient will:  °- take medications as prescribed as evidenced by patient report and record review °focus on medication adherence by pill box °check blood pressure periodically, document,  and provide at future appointments ° °  °  ° °Medication Assistance: None required.  Patient affirms current coverage meets needs. ° °Compliance/Adherence/Medication fill history: °Care Gaps: °HIV screening °Colonoscopy (due 05/10/14) ° °Star-Rating Drugs: °Rosuvastatin - LF 06/06/21 x 90 ds; PDC 100% ° ° °Patient's preferred pharmacy is: ° °[facility] - [place], [place] - [address] °[address] °[place] [address] °Phone: [phone] Fax: [phone] ° °Uses pill box? Yes °Pt endorses 100% compliance ° °We discussed: Current pharmacy is preferred with insurance plan and patient is satisfied with pharmacy services °Patient decided to: Continue current medication management strategy ° °Care Plan and Follow Up   Patient Decision:  Patient agrees to Care Plan and Follow-up. ° °Plan: Telephone follow up appointment with care management team member scheduled for:  9 months ° °Lindsey Foltanski, PharmD, BCACP °Clinical Pharmacist °[facility] °[phone] °

## 2021-07-28 NOTE — Patient Instructions (Signed)
Visit Information  Phone number for Pharmacist: [phone]  Thank you for meeting with me to discuss your medications! I look forward to working with you to achieve your health care goals. Below is a summary of what we talked about during the visit:   Goals Addressed             This Visit's Progress    Manage My Medicine       Timeframe:  Long-Range Goal Priority:  High Start Date:    07/28/21                         Expected End Date:   07/28/22                    Follow Up Date Nov 2023   - call for medicine refill 2 or 3 days before it runs out - call if I am sick and can't take my medicine - keep a list of all the medicines I take; vitamins and herbals too - use a pillbox to sort medicine    Why is this important?   These steps will help you keep on track with your medicines.   Notes:         Care Plan : CCM Pharmacy Care Plan  Updates made by Charlton Haws, RPH since 07/28/2021 12:00 AM     Problem: Hypertension, Hyperlipidemia, Atrial Fibrillation, GERD, Depression, and Anxiety, Cognitive impairment   Priority: High     Long-Range Goal: Disease mgmt   Start Date: 07/28/2021  Expected End Date: 07/28/2022  This Visit's Progress: On track  Priority: High  Note:   Current Barriers:  Unable to independently monitor therapeutic efficacy  Pharmacist Clinical Goal(s):  Patient will achieve adherence to monitoring guidelines and medication adherence to achieve therapeutic efficacy through collaboration with PharmD and provider.   Interventions: 1:1 collaboration with Ria Bush, MD regarding development and update of comprehensive plan of care as evidenced by provider attestation and co-signature Inter-disciplinary care team collaboration (see longitudinal plan of care) Comprehensive medication review performed; medication list updated in electronic medical record  Hypertension (BP goal <130/80) -Controlled - BP is at goal in clinic; pt endorses  compliance with medications;  he is taking furosemide for Meniere's disease, almost never has to take an extra dose for weight gain/swelling -Hx Meniere's disease; pt limits to 1000-1200 mg sodium per day -Current treatment: Metoprolol succinate 25 daily - Appropriate, Effective, Safe, Accessible Furosemide 20 mg daily AM - Appropriate, Effective, Safe, Accessible -Denies hypotensive/hypertensive symptoms -Educated on BP goals and benefits of medications for prevention of heart attack, stroke and kidney damage; -Counseled to monitor BP at home periodically -Recommended to continue current medication  Hyperlipidemia: (LDL goal < 70) -Controlled - LDL is at goal; Trig previously >800, now 120 -Hx CVA 2015 w/ chronic cognitive impairment -Hx hypertrophic obstructive cardiomyopathy -Current treatment: Rosuvastatin 10 mg daily HS -Appropriate, Effective, Safe, Accessible Fenofibrate 145 mg daily -Appropriate, Effective, Safe, Accessible -Educated on Cholesterol goals; Benefits of statin for ASCVD risk reduction; -Recommended to continue current medication  Atrial Fibrillation (Goal: prevent stroke and major bleeding) -Controlled -CHADSVASC: 2; hx MVR (St Jude 2015, INR goal 2.5-3.5); he has Lovenox at home if needed for bridging  -Pt does home INR every 2 weeks, follows with CPP at cardiology -Current treatment: Metoprolol succinate 25 mg daily HS -Appropriate, Effective, Safe, Accessible Warfarin 5 mg AD -Appropriate, Effective, Safe, Accessible -Counseled on avoidance of NSAIDs  due to increased bleeding risk with anticoagulants; importance of regular laboratory monitoring; -Recommended to continue current medication  Depression/Anxiety (Goal: manage symptoms) -Controlled - pt reports he was started on liothyronine off label to help with alertness, mood; recent TSH was WNL -PHQ9: 10 (01/2021) - moderate depression -GAD7: 3 (01/2021) - minimal anxiety -Connected with Psychiatry for mental  health support; recently started light therapy at home and pt reports it is helping -Current treatment: Sertraline 25 mg daily -Appropriate, Effective, Safe, Accessible Bupropion XL 150 mg daily -Appropriate, Effective, Safe, Accessible Liothyronine 5 mcg - 2 tablets daily -Appropriate, Query Effective, Safe, Accessible -Medications previously tried/failed: alprazolam, escitalopram -Educated on Benefits of medication for symptom control -Discussed his antidepressants are low doses, if he is not getting benefit he can contact prescriber for dose increase -Recommended to continue current medication  Cognitive deficity d/t stroke (Goal: manage symptoms) -Controlled - pt is aware of risks with stimulants; he reports his cardiologist and [facility] cardiologists are aware he is on them -Takes stimulants for "wakefulness" -Current treatment  Adderall XR 20 mg - 2 tab AM - Appropriate, Effective, Safe, Accessible Adderall 30 mg daily PM - Appropriate, Effective, Safe, Accessible -Recommended to continue current medication  Thumb pain (Goal: manage pain) -Controlled - pt is aware he needs to avoid NSAIDs -wears compression gloves at night which helps -Current treatment  Tylenol 500 mg BID -Appropriate, Effective, Safe, Accessible Tylenol No. 3 PRN - for headaches-Appropriate, Effective, Safe, Accessible Glucosamine-Vitamin D -Appropriate, Effective, Safe, Accessible -Recommended to continue current medication  GERD (Goal: manage symptoms) -Controlled -Current treatment  Famotidine 20 mg daily HS -Appropriate, Effective, Safe, Accessible -Recommended to continue current medication  Health Maintenance -Vaccine gaps: Shingrix, Covid booster, Flu -Hx Meniere's disease -Current therapy:  Cetirizine 10 mg daily PRN Multivitamin w/o vitamin K Sildenafil 100 mg PRN ED -Patient is satisfied with current therapy and denies issues -Recommended to continue current medication  Patient  Goals/Self-Care Activities Patient will:  - take medications as prescribed as evidenced by patient report and record review focus on medication adherence by pill box check blood pressure periodically, document, and provide at future appointments       Mr. Dylan Fletcher was given information about Chronic Care Management services today including:  CCM service includes personalized support from designated clinical staff supervised by his physician, including individualized plan of care and coordination with other care providers 24/7 contact phone numbers for assistance for urgent and routine care needs. Standard insurance, coinsurance, copays and deductibles apply for chronic care management only during months in which we provide at least 20 minutes of these services. Most insurances cover these services at 100%, however patients may be responsible for any copay, coinsurance and/or deductible if applicable. This service may help you avoid the need for more expensive face-to-face services. Only one practitioner may furnish and bill the service in a calendar month. The patient may stop CCM services at any time (effective at the end of the month) by phone call to the office staff.  Patient agreed to services and verbal consent obtained.   Patient verbalizes understanding of instructions and care plan provided today and agrees to view in [place]. Active MyChart status confirmed with patient.   Telephone follow up appointment with pharmacy team member scheduled for: 9 months  Charlene Brooke, PharmD, BCACP Clinical Pharmacist [facility] at [facility] [phone]

## 2021-07-29 DIAGNOSIS — F909 Attention-deficit hyperactivity disorder, unspecified type: Secondary | ICD-10-CM | POA: Diagnosis not present

## 2021-07-29 DIAGNOSIS — F0632 Mood disorder due to known physiological condition with major depressive-like episode: Secondary | ICD-10-CM | POA: Diagnosis not present

## 2021-08-05 DIAGNOSIS — F909 Attention-deficit hyperactivity disorder, unspecified type: Secondary | ICD-10-CM | POA: Diagnosis not present

## 2021-08-05 DIAGNOSIS — F0632 Mood disorder due to known physiological condition with major depressive-like episode: Secondary | ICD-10-CM | POA: Diagnosis not present

## 2021-08-09 DIAGNOSIS — I69311 Memory deficit following cerebral infarction: Secondary | ICD-10-CM | POA: Diagnosis not present

## 2021-08-09 DIAGNOSIS — F3342 Major depressive disorder, recurrent, in full remission: Secondary | ICD-10-CM | POA: Diagnosis not present

## 2021-08-09 DIAGNOSIS — F900 Attention-deficit hyperactivity disorder, predominantly inattentive type: Secondary | ICD-10-CM | POA: Diagnosis not present

## 2021-08-09 DIAGNOSIS — F411 Generalized anxiety disorder: Secondary | ICD-10-CM | POA: Diagnosis not present

## 2021-08-09 DIAGNOSIS — Z79899 Other long term (current) drug therapy: Secondary | ICD-10-CM | POA: Diagnosis not present

## 2021-08-10 DIAGNOSIS — F0632 Mood disorder due to known physiological condition with major depressive-like episode: Secondary | ICD-10-CM | POA: Diagnosis not present

## 2021-08-10 DIAGNOSIS — F909 Attention-deficit hyperactivity disorder, unspecified type: Secondary | ICD-10-CM | POA: Diagnosis not present

## 2021-08-12 DIAGNOSIS — Z1211 Encounter for screening for malignant neoplasm of colon: Secondary | ICD-10-CM | POA: Diagnosis not present

## 2021-08-16 DIAGNOSIS — F331 Major depressive disorder, recurrent, moderate: Secondary | ICD-10-CM | POA: Diagnosis not present

## 2021-08-16 DIAGNOSIS — E785 Hyperlipidemia, unspecified: Secondary | ICD-10-CM

## 2021-08-16 DIAGNOSIS — I10 Essential (primary) hypertension: Secondary | ICD-10-CM

## 2021-08-16 DIAGNOSIS — E781 Pure hyperglyceridemia: Secondary | ICD-10-CM | POA: Diagnosis not present

## 2021-08-19 LAB — COLOGUARD: COLOGUARD: NEGATIVE

## 2021-08-25 DIAGNOSIS — F0632 Mood disorder due to known physiological condition with major depressive-like episode: Secondary | ICD-10-CM | POA: Diagnosis not present

## 2021-08-25 DIAGNOSIS — F909 Attention-deficit hyperactivity disorder, unspecified type: Secondary | ICD-10-CM | POA: Diagnosis not present

## 2021-08-26 DIAGNOSIS — G9340 Encephalopathy, unspecified: Secondary | ICD-10-CM | POA: Diagnosis not present

## 2021-09-01 DIAGNOSIS — F909 Attention-deficit hyperactivity disorder, unspecified type: Secondary | ICD-10-CM | POA: Diagnosis not present

## 2021-09-01 DIAGNOSIS — F0632 Mood disorder due to known physiological condition with major depressive-like episode: Secondary | ICD-10-CM | POA: Diagnosis not present

## 2021-09-08 DIAGNOSIS — Z23 Encounter for immunization: Secondary | ICD-10-CM | POA: Diagnosis not present

## 2021-09-08 DIAGNOSIS — Z952 Presence of prosthetic heart valve: Secondary | ICD-10-CM | POA: Diagnosis not present

## 2021-09-15 DIAGNOSIS — I4891 Unspecified atrial fibrillation: Secondary | ICD-10-CM | POA: Diagnosis not present

## 2021-09-22 DIAGNOSIS — F909 Attention-deficit hyperactivity disorder, unspecified type: Secondary | ICD-10-CM | POA: Diagnosis not present

## 2021-09-22 DIAGNOSIS — F0632 Mood disorder due to known physiological condition with major depressive-like episode: Secondary | ICD-10-CM | POA: Diagnosis not present

## 2021-09-29 DIAGNOSIS — F0632 Mood disorder due to known physiological condition with major depressive-like episode: Secondary | ICD-10-CM | POA: Diagnosis not present

## 2021-09-29 DIAGNOSIS — F909 Attention-deficit hyperactivity disorder, unspecified type: Secondary | ICD-10-CM | POA: Diagnosis not present

## 2021-10-05 ENCOUNTER — Other Ambulatory Visit: Payer: Self-pay | Admitting: Family Medicine

## 2021-10-06 DIAGNOSIS — H8103 Meniere's disease, bilateral: Secondary | ICD-10-CM | POA: Diagnosis not present

## 2021-10-06 DIAGNOSIS — Z952 Presence of prosthetic heart valve: Secondary | ICD-10-CM | POA: Diagnosis not present

## 2021-10-06 DIAGNOSIS — F900 Attention-deficit hyperactivity disorder, predominantly inattentive type: Secondary | ICD-10-CM | POA: Diagnosis not present

## 2021-10-06 DIAGNOSIS — F3342 Major depressive disorder, recurrent, in full remission: Secondary | ICD-10-CM | POA: Diagnosis not present

## 2021-10-06 DIAGNOSIS — Z6834 Body mass index (BMI) 34.0-34.9, adult: Secondary | ICD-10-CM | POA: Diagnosis not present

## 2021-10-06 DIAGNOSIS — E782 Mixed hyperlipidemia: Secondary | ICD-10-CM | POA: Diagnosis not present

## 2021-10-06 DIAGNOSIS — F411 Generalized anxiety disorder: Secondary | ICD-10-CM | POA: Diagnosis not present

## 2021-10-06 DIAGNOSIS — I493 Ventricular premature depolarization: Secondary | ICD-10-CM | POA: Diagnosis not present

## 2021-10-06 DIAGNOSIS — I69311 Memory deficit following cerebral infarction: Secondary | ICD-10-CM | POA: Diagnosis not present

## 2021-10-06 DIAGNOSIS — I421 Obstructive hypertrophic cardiomyopathy: Secondary | ICD-10-CM | POA: Diagnosis not present

## 2021-10-06 DIAGNOSIS — Z79899 Other long term (current) drug therapy: Secondary | ICD-10-CM | POA: Diagnosis not present

## 2021-10-06 DIAGNOSIS — I480 Paroxysmal atrial fibrillation: Secondary | ICD-10-CM | POA: Diagnosis not present

## 2021-10-06 DIAGNOSIS — I951 Orthostatic hypotension: Secondary | ICD-10-CM | POA: Diagnosis not present

## 2021-10-17 ENCOUNTER — Telehealth: Payer: Self-pay

## 2021-10-17 NOTE — Chronic Care Management (AMB) (Signed)
Chronic Care Management Pharmacy Assistant   Name: Dylan Fletcher  MRN: [id number] DOB: [birth year]   Reason for Encounter: General Adherence     Recent office visits:  None since last CCM contact  Recent consult visits:  10/06/21-Ashley Lewis,MD( Heart and Vascular ) no data found 08/26/21-Harry Lay III, f/u encephalopathy- no data found 08/24/21-[facility]-Milind Desai,MD-cardiology research 08/10/21-[facility] (internal medicine) no data found 08/09/21-Hosteen Capelouto (Psychiatry) no data found  Hospital visits:  None in previous 6 months  Medications: Outpatient Encounter Medications as of 10/17/2021  Medication Sig   acetaminophen (TYLENOL) 500 MG tablet Take 1 tablet (500 mg total) by mouth 2 (two) times daily.   Acetaminophen-Codeine 300-30 MG tablet TAKE 1 TABLET BY MOUTH 3 (THREE) TIMES DAILY AS NEEDED FOR MODERATE PAIN (HEADACHE).   amoxicillin (AMOXIL) 500 MG tablet Take 500 mg by mouth as directed. Takes 4 tablets 1 hour prior to dental procedures   amphetamine-dextroamphetamine (ADDERALL XR) 20 MG 24 hr capsule Take 20 mg by mouth daily. Takes 2 tablets (40 mg) every morning   amphetamine-dextroamphetamine (ADDERALL) 30 MG tablet Take 30 mg by mouth daily. Takes in the afternoon   buPROPion (WELLBUTRIN XL) 150 MG 24 hr tablet Take 1 tablet by mouth daily.   cetirizine (ZYRTEC) 10 MG tablet Take 10 mg by mouth daily as needed.    famotidine (PEPCID) 20 MG tablet Take 20 mg by mouth daily.   fenofibrate (TRICOR) 145 MG tablet TAKE 1 TABLET BY MOUTH EVERY DAY   furosemide (LASIX) 20 MG tablet Take 1 tablet (20 mg total) by mouth daily. With extra prn weight gain   Glucosamine-Vitamin D 1000-200 MG-UNIT TABS Take 2 tablets by mouth daily.   liothyronine (CYTOMEL) 5 MCG tablet Take 2 tablets by mouth daily.   metoprolol succinate (TOPROL-XL) 25 MG 24 hr tablet Take 25 mg by mouth daily. Takes 2 tablets for excessive palpitations   Multiple Vitamin  (MULTIVITAMIN) tablet Take 1 tablet by mouth daily.   rosuvastatin (CRESTOR) 10 MG tablet Take 10 mg by mouth at bedtime.   sertraline (ZOLOFT) 25 MG tablet Take 25 mg by mouth daily.   sildenafil (VIAGRA) 100 MG tablet Take 1 tablet (100 mg total) by mouth daily as needed for erectile dysfunction.   warfarin (COUMADIN) 5 MG tablet Take 5 mg by mouth as directed.    No facility-administered encounter medications on file as of 10/17/2021.    Contacted [facility] on 10/26/21 for general disease state and medication adherence call. Spoke with wife [name]  Patient is not more than 5 days past due for refill on the following medications per chart history:  [facility] Medications: Medication Name/mg Last Fill Days Supply Rosuvastatin '10mg'$   09/11/21 90    What concerns do you have about your medications? The patient is able to tolerate generic medications   The patient denies side effects with their medications.   How often do you forget or accidentally miss a dose? Never  Do you use a pillbox? Yes  Are you having any problems getting your medications from your pharmacy? No  Has the cost of your medications been a concern? No  Since last visit with CPP, no interventions have been made. Wife [name] states the patient is doing well  The patient has not had an ED visit since last contact.   The patient denies problems with their health. Wife states he is feeling well, [place]  stays monitored by many specialists  Patient denies concerns or  questions for Charlene Brooke, PharmD at this time.   Counseled patient on:  [place] job taking medications, Importance of taking medication daily without missed doses, Benefits of adherence packaging or a pillbox, and Access to CCM team for any cost, medication or pharmacy concerns.   Care Gaps: Annual wellness visit in last year? Yes Most Recent BP reading:122/72 71-P 07/22/21   Upcoming appointments: CCM appointment on 04/26/22   Charlene Brooke, CPP notified  Avel Sensor, [place]  [phone]

## 2021-10-20 DIAGNOSIS — Z952 Presence of prosthetic heart valve: Secondary | ICD-10-CM | POA: Diagnosis not present

## 2021-11-03 DIAGNOSIS — I4891 Unspecified atrial fibrillation: Secondary | ICD-10-CM | POA: Diagnosis not present

## 2021-11-24 DIAGNOSIS — I4891 Unspecified atrial fibrillation: Secondary | ICD-10-CM | POA: Diagnosis not present

## 2021-12-01 DIAGNOSIS — I4891 Unspecified atrial fibrillation: Secondary | ICD-10-CM | POA: Diagnosis not present

## 2021-12-01 DIAGNOSIS — Z952 Presence of prosthetic heart valve: Secondary | ICD-10-CM | POA: Diagnosis not present

## 2021-12-08 DIAGNOSIS — F0632 Mood disorder due to known physiological condition with major depressive-like episode: Secondary | ICD-10-CM | POA: Diagnosis not present

## 2021-12-08 DIAGNOSIS — F909 Attention-deficit hyperactivity disorder, unspecified type: Secondary | ICD-10-CM | POA: Diagnosis not present

## 2021-12-15 DIAGNOSIS — I4891 Unspecified atrial fibrillation: Secondary | ICD-10-CM | POA: Diagnosis not present

## 2021-12-22 DIAGNOSIS — I4891 Unspecified atrial fibrillation: Secondary | ICD-10-CM | POA: Diagnosis not present

## 2021-12-29 DIAGNOSIS — I4891 Unspecified atrial fibrillation: Secondary | ICD-10-CM | POA: Diagnosis not present

## 2021-12-29 DIAGNOSIS — Z952 Presence of prosthetic heart valve: Secondary | ICD-10-CM | POA: Diagnosis not present

## 2022-01-03 DIAGNOSIS — I69311 Memory deficit following cerebral infarction: Secondary | ICD-10-CM | POA: Diagnosis not present

## 2022-01-03 DIAGNOSIS — F411 Generalized anxiety disorder: Secondary | ICD-10-CM | POA: Diagnosis not present

## 2022-01-03 DIAGNOSIS — Z79899 Other long term (current) drug therapy: Secondary | ICD-10-CM | POA: Diagnosis not present

## 2022-01-03 DIAGNOSIS — F900 Attention-deficit hyperactivity disorder, predominantly inattentive type: Secondary | ICD-10-CM | POA: Diagnosis not present

## 2022-01-03 DIAGNOSIS — F3341 Major depressive disorder, recurrent, in partial remission: Secondary | ICD-10-CM | POA: Diagnosis not present

## 2022-01-08 ENCOUNTER — Other Ambulatory Visit: Payer: Self-pay | Admitting: Family Medicine

## 2022-01-08 DIAGNOSIS — E785 Hyperlipidemia, unspecified: Secondary | ICD-10-CM

## 2022-01-19 ENCOUNTER — Telehealth: Payer: Self-pay

## 2022-01-19 NOTE — Telephone Encounter (Signed)
Pt currently scheduled for CPE on Fri, 01/20/22 at 11:00.  But MCR wellness is not until Thus, 01/26/22.  Need to r/s CPE after 01/26/22 (as OV- put "CPE" in appt note) and a lab visit.

## 2022-01-20 ENCOUNTER — Ambulatory Visit: Payer: Medicare Other | Admitting: Family Medicine

## 2022-01-21 ENCOUNTER — Other Ambulatory Visit: Payer: Self-pay | Admitting: Family Medicine

## 2022-01-21 DIAGNOSIS — E785 Hyperlipidemia, unspecified: Secondary | ICD-10-CM

## 2022-01-21 DIAGNOSIS — I10 Essential (primary) hypertension: Secondary | ICD-10-CM

## 2022-01-21 DIAGNOSIS — Z125 Encounter for screening for malignant neoplasm of prostate: Secondary | ICD-10-CM

## 2022-01-21 DIAGNOSIS — E559 Vitamin D deficiency, unspecified: Secondary | ICD-10-CM

## 2022-01-23 ENCOUNTER — Other Ambulatory Visit (INDEPENDENT_AMBULATORY_CARE_PROVIDER_SITE_OTHER): Payer: Medicare Other

## 2022-01-23 DIAGNOSIS — E785 Hyperlipidemia, unspecified: Secondary | ICD-10-CM

## 2022-01-23 DIAGNOSIS — I10 Essential (primary) hypertension: Secondary | ICD-10-CM | POA: Diagnosis not present

## 2022-01-23 DIAGNOSIS — Z125 Encounter for screening for malignant neoplasm of prostate: Secondary | ICD-10-CM

## 2022-01-23 DIAGNOSIS — E559 Vitamin D deficiency, unspecified: Secondary | ICD-10-CM

## 2022-01-23 LAB — CBC WITH DIFFERENTIAL/PLATELET
Basophils Absolute: 0 10*3/uL (ref 0.0–0.1)
Basophils Relative: 0.5 % (ref 0.0–3.0)
Eosinophils Absolute: 0.3 10*3/uL (ref 0.0–0.7)
Eosinophils Relative: 3.4 % (ref 0.0–5.0)
HCT: 42.5 % (ref 39.0–52.0)
Hemoglobin: 14.2 g/dL (ref 13.0–17.0)
Lymphocytes Relative: 42.6 % (ref 12.0–46.0)
Lymphs Abs: 3.6 10*3/uL (ref 0.7–4.0)
MCHC: 33.3 g/dL (ref 30.0–36.0)
MCV: 92.4 fl (ref 78.0–100.0)
Monocytes Absolute: 1 10*3/uL (ref 0.1–1.0)
Monocytes Relative: 11.3 % (ref 3.0–12.0)
Neutro Abs: 3.5 10*3/uL (ref 1.4–7.7)
Neutrophils Relative %: 42.2 % — ABNORMAL LOW (ref 43.0–77.0)
Platelets: 264 10*3/uL (ref 150.0–400.0)
RBC: 4.6 Mil/uL (ref 4.22–5.81)
RDW: 14.3 % (ref 11.5–15.5)
WBC: 8.4 10*3/uL (ref 4.0–10.5)

## 2022-01-23 LAB — VITAMIN D 25 HYDROXY (VIT D DEFICIENCY, FRACTURES): VITD: 53.2 ng/mL (ref 30.00–100.00)

## 2022-01-23 LAB — MICROALBUMIN / CREATININE URINE RATIO
Creatinine,U: 98.6 mg/dL
Microalb Creat Ratio: 0.7 mg/g (ref 0.0–30.0)
Microalb, Ur: <0.7 mg/dL (ref 0.0–1.9)

## 2022-01-23 LAB — PSA, MEDICARE: PSA: 0.93 ng/ml (ref 0.10–4.00)

## 2022-01-24 LAB — COMPREHENSIVE METABOLIC PANEL
ALT: 21 U/L (ref 0–53)
AST: 27 U/L (ref 0–37)
Albumin: 4.3 g/dL (ref 3.5–5.2)
Alkaline Phosphatase: 26 U/L — ABNORMAL LOW (ref 39–117)
BUN: 18 mg/dL (ref 6–23)
CO2: 28 mEq/L (ref 19–32)
Calcium: 9 mg/dL (ref 8.4–10.5)
Chloride: 106 mEq/L (ref 96–112)
Creatinine, Ser: 1.57 mg/dL — ABNORMAL HIGH (ref 0.40–1.50)
GFR: 46.8 mL/min — ABNORMAL LOW (ref >60.00)
Glucose, Bld: 101 mg/dL — ABNORMAL HIGH (ref 70–99)
Potassium: 4.3 mEq/L (ref 3.5–5.1)
Sodium: 142 mEq/L (ref 135–145)
Total Bilirubin: 0.5 mg/dL (ref 0.2–1.2)
Total Protein: 6.6 g/dL (ref 6.0–8.3)

## 2022-01-24 LAB — LIPID PANEL
Cholesterol: 137 mg/dL (ref 0–200)
HDL: 40.4 mg/dL (ref >39.00)
LDL Cholesterol: 78 mg/dL (ref 0–99)
NonHDL: 97.09
Total CHOL/HDL Ratio: 3
Triglycerides: 93 mg/dL (ref 0.0–149.0)
VLDL: 18.6 mg/dL (ref 0.0–40.0)

## 2022-01-25 DIAGNOSIS — H8110 Benign paroxysmal vertigo, unspecified ear: Secondary | ICD-10-CM | POA: Insufficient documentation

## 2022-01-26 ENCOUNTER — Ambulatory Visit (INDEPENDENT_AMBULATORY_CARE_PROVIDER_SITE_OTHER): Payer: Medicare Other

## 2022-01-26 VITALS — Wt 211.0 lb

## 2022-01-26 DIAGNOSIS — I480 Paroxysmal atrial fibrillation: Secondary | ICD-10-CM | POA: Diagnosis not present

## 2022-01-26 DIAGNOSIS — Z0000 Encounter for general adult medical examination without abnormal findings: Secondary | ICD-10-CM

## 2022-01-26 DIAGNOSIS — Z952 Presence of prosthetic heart valve: Secondary | ICD-10-CM | POA: Diagnosis not present

## 2022-01-26 NOTE — Progress Notes (Signed)
Virtual Visit via Telephone Note  I connected with  Dylan Fletcher on 01/26/22 at  9:15 AM EDT by telephone and verified that I am speaking with the correct person using two identifiers. Talked with wife as well  Location: Patient: home Provider: [place] Persons participating in the virtual visit: patient/Nurse Health Advisor   I discussed the limitations, risks, security and privacy concerns of performing an evaluation and management service by telephone and the availability of in person appointments. The patient expressed understanding and agreed to proceed.  Interactive audio and video telecommunications were attempted between this nurse and patient, however failed, due to patient having technical difficulties OR patient did not have access to video capability.  We continued and completed visit with audio only.  Some vital signs may be absent or patient reported.   Dionisio Roan, LPN  Subjective:   Dylan Fletcher is a 61 y.o. male who presents for Medicare Annual/Subsequent preventive examination.  Review of Systems           Objective:    There were no vitals filed for this visit. There is no height or weight on file to calculate BMI.     11/10/2019    3:41 PM 01/29/2016    5:07 PM 12/18/2014    2:07 PM  Advanced Directives  Does Patient Have a Medical Advance Directive? No No No  Does patient want to make changes to medical advance directive? No - Patient declined    Would patient like information on creating a medical advance directive?  No - patient declined information Yes - Educational materials given    Current Medications (verified) Outpatient Encounter Medications as of 01/26/2022  Medication Sig   acetaminophen (TYLENOL) 500 MG tablet Take 1 tablet (500 mg total) by mouth 2 (two) times daily.   Acetaminophen-Codeine 300-30 MG tablet TAKE 1 TABLET BY MOUTH 3 (THREE) TIMES DAILY AS NEEDED FOR MODERATE PAIN (HEADACHE).    amphetamine-dextroamphetamine (ADDERALL XR) 20 MG 24 hr capsule Take 20 mg by mouth daily. Takes 2 tablets (40 mg) every morning   amphetamine-dextroamphetamine (ADDERALL XR) 20 MG 24 hr capsule Take by mouth.   amphetamine-dextroamphetamine (ADDERALL) 30 MG tablet Take by mouth.   buPROPion (WELLBUTRIN XL) 150 MG 24 hr tablet Take 1 tablet by mouth every morning.   Cholecalciferol 50 MCG (2000 UT) CAPS 1 capsule every day by oral route.   diphenhydrAMINE (BENADRYL) 25 mg capsule Take by mouth.   famotidine (PEPCID) 20 MG tablet Take by mouth.   fenofibrate (TRICOR) 145 MG tablet Take 1 tablet by mouth daily.   furosemide (LASIX) 20 MG tablet Take 1 tablet (20 mg total) by mouth daily. With extra prn weight gain   Glucosamine-Vitamin D 1000-200 MG-UNIT TABS Take 2 tablets by mouth daily.   influenza vac split quadrivalent PF (FLUARIX) 0.5 ML injection TO BE ADMINISTERED BY PHARMACIST FOR IMMUNIZATION   liothyronine (CYTOMEL) 5 MCG tablet Take 2 tablets by mouth daily.   liothyronine (CYTOMEL) 5 MCG tablet Take by mouth.   meclizine (ANTIVERT) 25 MG tablet Take by mouth.   metoprolol succinate (TOPROL-XL) 25 MG 24 hr tablet Take 25 mg by mouth daily. Takes 2 tablets for excessive palpitations   Multiple Vitamin (MULTIVITAMIN) tablet Take 1 tablet by mouth daily.   rosuvastatin (CRESTOR) 10 MG tablet Take 10 mg by mouth at bedtime.   sertraline (ZOLOFT) 25 MG tablet Take 25 mg by mouth daily.   sildenafil (VIAGRA) 100 MG tablet Take 1  tablet (100 mg total) by mouth daily as needed for erectile dysfunction.   warfarin (COUMADIN) 5 MG tablet Take 5 mg by mouth as directed.    warfarin (COUMADIN) 7.5 MG tablet SMARTSIG:0.5-1 Tablet(s) By Mouth As Directed   [DISCONTINUED] sertraline (ZOLOFT) 25 MG tablet Take 1 tablet by mouth daily.   amoxicillin (AMOXIL) 500 MG tablet Take 500 mg by mouth as directed. Takes 4 tablets 1 hour prior to dental procedures (Patient not taking: Reported on 01/26/2022)    [DISCONTINUED] acetaminophen (TYLENOL) 500 MG tablet Take by mouth.   [DISCONTINUED] Acetaminophen 325 MG CAPS    [DISCONTINUED] acetaminophen-codeine (TYLENOL #3) 300-30 MG tablet Take by mouth.   [DISCONTINUED] amphetamine-dextroamphetamine (ADDERALL) 20 MG tablet TK 1 T PO TID   [DISCONTINUED] amphetamine-dextroamphetamine (ADDERALL) 30 MG tablet Take 30 mg by mouth daily. Takes in the afternoon   [DISCONTINUED] buPROPion (WELLBUTRIN XL) 150 MG 24 hr tablet Take 1 tablet by mouth daily.   [DISCONTINUED] cetirizine (ZYRTEC) 10 MG tablet Take 10 mg by mouth daily as needed.    [DISCONTINUED] donepezil (ARICEPT) 10 MG tablet Take by mouth.   [DISCONTINUED] famotidine (PEPCID) 20 MG tablet Take 20 mg by mouth daily.   [DISCONTINUED] fenofibrate (TRICOR) 145 MG tablet TAKE 1 TABLET BY MOUTH EVERY DAY   [DISCONTINUED] fluticasone (FLONASE) 50 MCG/ACT nasal spray Place 2 sprays into both nostrils daily.   [DISCONTINUED] sertraline (ZOLOFT) 50 MG tablet Take 50 mg by mouth daily.   No facility-administered encounter medications on file as of 01/26/2022.    Allergies (verified) Nicardipine hcl, Nitroglycerin, Calcium channel blockers, Molds & smuts, Shellfish allergy, and Tape   History: Past Medical History:  Diagnosis Date   ADD (attention deficit disorder)    sees psych in [place], [place] (meds from there)   Allergic rhinitis    Ragweed, mold, mildew   Anxiety associated with depression    sees psych in [place], [place] (meds from there)   Arrhythmia    Claustrophobia    Colon polyp 04/2009   rectal tubular adenoma, rec rpt 3 yrs   CVA (cerebral infarction) 08/2013   L thalamic lacunar infarct post CVTS surgery, s/p neuropsychology testing, completed speech therapy. no ASA 2/2 no CAD hx   ED (erectile dysfunction)    H/O mitral valve replacement 08/2013   St Jude, needs abx ppx, completed cardiac rehab 12/2013   History of asthma    as child   HLD (hyperlipidemia)    HTN (hypertension)     Hypertrophic obstructive cardiomyopathy(425.11)    Cards (Dr. Jodell Cipro [facility] - need abx prophylaxis - now established with [facility] Dr. Rosetta Posner [phone]) and Dr. Shearon Stalls and Octavia Heir s/p myomectomy, now cardiac rehab [facility] 09/2013   LBBB (left bundle branch block) 08/2013   after myomectomy   Migraine with aura    Need for prophylactic antibiotic    Obesity    saw nutritionist 12/18/2014   OSA (obstructive sleep apnea)    CPAP at night, up to 16 mmHg (Dr Nevada Crane at [facility])   Paroxysmal atrial fibrillation ([place]) 04/2013, 05/2013   with RVR; s/p multiple hospitalizations, spontaneous conversion, referred to Dr. Manuella Ghazi EP, then recurrence - failed sotalol, multaq (both with spont conversions)   Transient alteration of awareness 05/2014   during hospitalization pending EEG [facility])   Vitamin D deficiency    Warfarin anticoagulation    goal INR 2.5-3.5   Past Surgical History:  Procedure Laterality Date   CARDIAC CATHETERIZATION  2014  done for chest pain, no plaque buildup   COLONOSCOPY  04/2009   rectal tubular adenoma x1, rpt 3 yrs   COLONOSCOPY  04/2014   mild diverticulosis, no polyps, rec rpt 5 yrs [facility])   [place]; [place] Right 04/2015   for PVD   hospitalization  04/2014   subtherapeutic INR, heparin bridge   INGUINAL HERNIA REPAIR  1987   KNEE ARTHROSCOPY  2006; 2007   right   MITRAL VALVE REPLACEMENT  08/2013   St Jude MV for severe central mitral regurg   MYOMECTOMY  08/22/2013   with MAZE procedure [facility] (Drs [place])   PFTs  07/2013   FVC 79%, FEV1 72%, ratio 0.71 - mild obstruction, o/w normal   VASECTOMY  1992   WISDOM TOOTH EXTRACTION  1977   Family History  Problem Relation Age of Onset   Alcohol abuse Brother    Cancer Mother 18       breast   Ulcers Mother    Heart disease Mother        HOCM   Psoriasis Father    Cancer Maternal Grandfather        colon   Coronary artery disease Paternal  Grandfather 26       MI   Diabetes Neg Hx    Stroke Neg Hx    Social History   Socioeconomic History   Marital status: Married    Spouse name: Not on file   Number of children: Not on file   Years of education: Not on file   Highest education level: Not on file  Occupational History   Not on file  Tobacco Use   Smoking status: Never   Smokeless tobacco: Never  Substance and Sexual Activity   Alcohol use: Yes    Comment: rarely   Drug use: No   Sexual activity: Not on file  Other Topics Concern   Not on file  Social History Narrative   Caffeine: quart of soda/day   Lives with wife ([name]), 3 cats. No children.   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's degree   Activity: walking several times a week   Diet: good water, fruits/vegetables daily       Cards: Dr Jodell Cipro [facility] (HOCM) and Dr Arbutus Leas (EP), as well as [facility] Dr Su Monks   Social Determinants of Health   Financial Resource Strain: Low Risk  (07/28/2021)   Overall Financial Resource Strain (CARDIA)    Difficulty of Paying Living Expenses: Not hard at all  Food Insecurity: No Food Insecurity (07/28/2021)   Hunger Vital Sign    Worried About Running Out of Food in the Last Year: Never true    [place] in the Last Year: Never true  Transportation Needs: No Transportation Needs (11/10/2019)   PRAPARE - Transportation    Lack of Transportation (Medical): No    Lack of Transportation (Non-Medical): No  Physical Activity: Sufficiently Active (01/26/2022)   Exercise Vital Sign    Days of Exercise per Week: 2 days    Minutes of Exercise per Session: 120 min  Stress: No Stress Concern Present (01/26/2022)   [place]    Feeling of Stress : Not at all  Social Connections: Not on file    Tobacco Counseling Counseling given: Not Answered   Clinical Intake:  Pre-visit preparation completed: Yes  Pain :  No/denies pain     Nutritional Risks: None Diabetes: No  How often do you need to have someone help you when you read instructions, pamphlets, or other written materials from your doctor or pharmacy?: 1 - Never  Diabetic?no  Interpreter Needed?: No  Information entered by :: Kirke Shaggy, LPN   Activities of Daily Living    01/26/2022    9:02 AM  In your present state of health, do you have any difficulty performing the following activities:  Hearing? 0  Vision? 0  Difficulty concentrating or making decisions? 1  Walking or climbing stairs? 0  Dressing or bathing? 0  Doing errands, shopping? 1  Preparing Food and eating ? N  Using the Toilet? N  In the past six months, have you accidently leaked urine? N  Do you have problems with loss of bowel control? N  Managing your Medications? Y  Managing your Finances? Y  Housekeeping or managing your Housekeeping? Y    Patient Care Team: Ria Bush, MD as PCP - General (Family Medicine) Curly Shores, MD (Cardiology) Ezekiel Ina, MD (Inactive) (Cardiology) Charlton Haws, [facility] as Pharmacist (Pharmacist)  Indicate any recent Medical Services you may have received from other than [facility] providers in the past year (date may be approximate).     Assessment:   This is a routine wellness examination for Dylan Fletcher.  Hearing/Vision screen No results found.  Dietary issues and exercise activities discussed:     Goals Addressed             This Visit's Progress    DIET - EAT MORE FRUITS AND VEGETABLES         Depression Screen    01/26/2022    9:20 AM 01/19/2021   11:45 AM 11/10/2019    3:46 PM 06/24/2018   10:42 AM 06/24/2018    9:59 AM 06/18/2017    8:27 AM 12/18/2014    2:08 PM  PHQ 2/9 Scores  PHQ - 2 Score 1 0 0 0 0 0 0  PHQ- 9 Score 1 10 0      Exception Documentation     Patient refusal      Fall Risk    01/26/2022    9:02 AM 01/19/2021   11:12 AM 07/21/2020   12:15 PM 11/10/2019    3:43 PM 06/24/2018     9:59 AM  Fall Risk   Falls in the past year? '1 1 1 '$ 0 1  Number falls in past yr: 1 0 0 0 0  Injury with Fall? 1 0 1 0 0  Comment   Laceration on head    Risk for fall due to :    Mental status change   Follow up    Falls evaluation completed;Falls prevention discussed     FALL RISK PREVENTION PERTAINING TO THE HOME:  Any stairs in or around the home? Yes  If so, are there any without handrails? No  Home free of loose throw rugs in walkways, pet beds, electrical cords, etc? Yes  Adequate lighting in your home to reduce risk of falls? Yes   ASSISTIVE DEVICES UTILIZED TO PREVENT FALLS:  Life alert? No  Use of a cane, walker or w/c? No  Grab bars in the bathroom? Yes  Shower chair or bench in shower? Yes  Elevated toilet seat or a handicapped toilet? No    Cognitive Function:not completed      11/10/2019    3:57 PM  MMSE - Mini Mental State  Exam  Not completed: Unable to complete        Immunizations Immunization History  Administered Date(s) Administered   Influenza,inj,Quad PF,6+ Mos 03/19/2015, 03/17/2016, 03/19/2018, 04/04/2019   Influenza,inj,quad, With Preservative 03/19/2016   Influenza-Unspecified 03/19/2014, 03/16/2017   Moderna Sars-Covid-2 Vaccination 07/15/2019, 09/22/2019   Pneumococcal Polysaccharide-23 03/19/2015   Tdap 02/06/2013, 11/29/2019    TDAP status: Up to date  Flu Vaccine status: Up to date  Pneumococcal vaccine status: Up to date  Covid-19 vaccine status: Completed vaccines  Qualifies for Shingles Vaccine? Yes   Zostavax completed No   Shingrix Completed?: Yes  Screening Tests Health Maintenance  Topic Date Due   HIV Screening  Never done   Zoster Vaccines- Shingrix (1 of 2) Never done   COVID-19 Vaccine (3 - Moderna risk series) 10/20/2019   INFLUENZA VACCINE  01/17/2022   Fecal DNA (Cologuard)  08/12/2024   TETANUS/TDAP  11/28/2029   Hepatitis C Screening  Completed   HPV VACCINES  Aged Out   COLONOSCOPY (Pts 45-21yr  Insurance coverage will need to be confirmed)  Discontinued    Health Maintenance  Health Maintenance Due  Topic Date Due   HIV Screening  Never done   Zoster Vaccines- Shingrix (1 of 2) Never done   COVID-19 Vaccine (3 - Moderna risk series) 10/20/2019   INFLUENZA VACCINE  01/17/2022    Colorectal cancer screening: Type of screening: Cologuard. Completed 08/19/21. Repeat every 3 years  Lung Cancer Screening: (Low Dose CT Chest recommended if Age 63-80years, 30 pack-year currently smoking OR have quit w/in 15years.) does not qualify.   Additional Screening:  Hepatitis C Screening: does qualify; Completed 06/09/16  Vision Screening: Recommended annual ophthalmology exams for early detection of glaucoma and other disorders of the eye. Is the patient up to date with their annual eye exam?  Yes  Who is the provider or what is the name of the office in which the patient attends annual eye exams? [place] is not established with a provider, would they like to be referred to a provider to establish care? No .   Dental Screening: Recommended annual dental exams for proper oral hygiene  Community Resource Referral / Chronic Care Management: CRR required this visit?  No   CCM required this visit?  No      Plan:     I have personally reviewed and noted the following in the patient's chart:   Medical and social history Use of alcohol, tobacco or illicit drugs  Current medications and supplements including opioid prescriptions. Patient is not currently taking opioid prescriptions. Functional ability and status Nutritional status Physical activity Advanced directives List of other physicians Hospitalizations, surgeries, and ER visits in previous 12 months Vitals Screenings to include cognitive, depression, and falls Referrals and appointments  In addition, I have reviewed and discussed with patient certain preventive protocols, quality metrics, and best practice  recommendations. A written personalized care plan for preventive services as well as general preventive health recommendations were provided to patient.     LDionisio Jaciel LPN   [id number]  Nurse Notes: none

## 2022-01-26 NOTE — Patient Instructions (Signed)
Dylan Fletcher , Thank you for taking time to come for your Medicare Wellness Visit. I appreciate your ongoing commitment to your health goals. Please review the following plan we discussed and let me know if I can assist you in the future.   Screening recommendations/referrals: Colonoscopy: Cologuard 08/19/21 Recommended yearly ophthalmology/optometry visit for glaucoma screening and checkup Recommended yearly dental visit for hygiene and checkup  Vaccinations: Influenza vaccine: 03/30/21 Pneumococcal vaccine: 03/19/15 Tdap vaccine: 11/29/19 Shingles vaccine: per pt had in May and 1 week ago   Covid-19: 07/15/19, 09/22/19  Advanced directives: no  Conditions/risks identified: none  Next appointment: Follow up in one year for your annual wellness visit 01/29/23 @ 10:30 am by phone  Preventive Care 40-64 Years, Male Preventive care refers to lifestyle choices and visits with your health care provider that can promote health and wellness. What does preventive care include? A yearly physical exam. This is also called an annual well check. Dental exams once or twice a year. Routine eye exams. Ask your health care provider how often you should have your eyes checked. Personal lifestyle choices, including: Daily care of your teeth and gums. Regular physical activity. Eating a healthy diet. Avoiding tobacco and drug use. Limiting alcohol use. Practicing safe sex. Taking low-dose aspirin every day starting at age 50. What happens during an annual well check? The services and screenings done by your health care provider during your annual well check will depend on your age, overall health, lifestyle risk factors, and family history of disease. Counseling  Your health care provider may ask you questions about your: Alcohol use. Tobacco use. Drug use. Emotional well-being. Home and relationship well-being. Sexual activity. Eating habits. Work and work Statistician. Screening  You may  have the following tests or measurements: Height, weight, and BMI. Blood pressure. Lipid and cholesterol levels. These may be checked every 5 years, or more frequently if you are over 82 years old. Skin check. Lung cancer screening. You may have this screening every year starting at age 6 if you have a 30-pack-year history of smoking and currently smoke or have quit within the past 15 years. Fecal occult blood test (FOBT) of the stool. You may have this test every year starting at age 63. Flexible sigmoidoscopy or colonoscopy. You may have a sigmoidoscopy every 5 years or a colonoscopy every 10 years starting at age 26. Prostate cancer screening. Recommendations will vary depending on your family history and other risks. Hepatitis C blood test. Hepatitis B blood test. Sexually transmitted disease (STD) testing. Diabetes screening. This is done by checking your blood sugar (glucose) after you have not eaten for a while (fasting). You may have this done every 1-3 years. Discuss your test results, treatment options, and if necessary, the need for more tests with your health care provider. Vaccines  Your health care provider may recommend certain vaccines, such as: Influenza vaccine. This is recommended every year. Tetanus, diphtheria, and acellular pertussis (Tdap, Td) vaccine. You may need a Td booster every 10 years. Zoster vaccine. You may need this after age 12. Pneumococcal 13-valent conjugate (PCV13) vaccine. You may need this if you have certain conditions and have not been vaccinated. Pneumococcal polysaccharide (PPSV23) vaccine. You may need one or two doses if you smoke cigarettes or if you have certain conditions. Talk to your health care provider about which screenings and vaccines you need and how often you need them. This information is not intended to replace advice given to you by your health  care provider. Make sure you discuss any questions you have with your health care  provider. Document Released: 07/02/2015 Document Revised: 02/23/2016 Document Reviewed: 04/06/2015 Elsevier Interactive Patient Education  2017 [place] Prevention in the Home Falls can cause injuries. They can happen to people of all ages. There are many things you can do to make your home safe and to help prevent falls. What can I do on the outside of my home? Regularly fix the edges of walkways and driveways and fix any cracks. Remove anything that might make you trip as you walk through a door, such as a raised step or threshold. Trim any bushes or trees on the path to your home. Use bright outdoor lighting. Clear any walking paths of anything that might make someone trip, such as rocks or tools. Regularly check to see if handrails are loose or broken. Make sure that both sides of any steps have handrails. Any raised decks and porches should have guardrails on the edges. Have any leaves, snow, or ice cleared regularly. Use sand or salt on walking paths during winter. Clean up any spills in your garage right away. This includes oil or grease spills. What can I do in the bathroom? Use night lights. Install grab bars by the toilet and in the tub and shower. Do not use towel bars as grab bars. Use non-skid mats or decals in the tub or shower. If you need to sit down in the shower, use a plastic, non-slip stool. Keep the floor dry. Clean up any water that spills on the floor as soon as it happens. Remove soap buildup in the tub or shower regularly. Attach bath mats securely with double-sided non-slip rug tape. Do not have throw rugs and other things on the floor that can make you trip. What can I do in the bedroom? Use night lights. Make sure that you have a light by your bed that is easy to reach. Do not use any sheets or blankets that are too big for your bed. They should not hang down onto the floor. Have a firm chair that has side arms. You can use this for support while  you get dressed. Do not have throw rugs and other things on the floor that can make you trip. What can I do in the kitchen? Clean up any spills right away. Avoid walking on wet floors. Keep items that you use a lot in easy-to-reach places. If you need to reach something above you, use a strong step stool that has a grab bar. Keep electrical cords out of the way. Do not use floor polish or wax that makes floors slippery. If you must use wax, use non-skid floor wax. Do not have throw rugs and other things on the floor that can make you trip. What can I do with my stairs? Do not leave any items on the stairs. Make sure that there are handrails on both sides of the stairs and use them. Fix handrails that are broken or loose. Make sure that handrails are as long as the stairways. Check any carpeting to make sure that it is firmly attached to the stairs. Fix any carpet that is loose or worn. Avoid having throw rugs at the top or bottom of the stairs. If you do have throw rugs, attach them to the floor with carpet tape. Make sure that you have a light switch at the top of the stairs and the bottom of the stairs. If you do not  have them, ask someone to add them for you. What else can I do to help prevent falls? Wear shoes that: Do not have high heels. Have rubber bottoms. Are comfortable and fit you well. Are closed at the toe. Do not wear sandals. If you use a stepladder: Make sure that it is fully opened. Do not climb a closed stepladder. Make sure that both sides of the stepladder are locked into place. Ask someone to hold it for you, if possible. Clearly mark and make sure that you can see: Any grab bars or handrails. First and last steps. Where the edge of each step is. Use tools that help you move around (mobility aids) if they are needed. These include: Canes. Walkers. Scooters. Crutches. Turn on the lights when you go into a dark area. Replace any light bulbs as soon as they burn  out. Set up your furniture so you have a clear path. Avoid moving your furniture around. If any of your floors are uneven, fix them. If there are any pets around you, be aware of where they are. Review your medicines with your doctor. Some medicines can make you feel dizzy. This can increase your chance of falling. Ask your doctor what other things that you can do to help prevent falls. This information is not intended to replace advice given to you by your health care provider. Make sure you discuss any questions you have with your health care provider. Document Released: 04/01/2009 Document Revised: 11/11/2015 Document Reviewed: 07/10/2014 Elsevier Interactive Patient Education  2017 [employer].

## 2022-01-30 ENCOUNTER — Encounter: Payer: Self-pay | Admitting: Family Medicine

## 2022-01-30 ENCOUNTER — Ambulatory Visit (INDEPENDENT_AMBULATORY_CARE_PROVIDER_SITE_OTHER): Payer: Medicare Other | Admitting: Family Medicine

## 2022-01-30 VITALS — BP 134/70 | HR 85 | Temp 97.4°F | Ht 65.5 in | Wt 216.2 lb

## 2022-01-30 DIAGNOSIS — F909 Attention-deficit hyperactivity disorder, unspecified type: Secondary | ICD-10-CM

## 2022-01-30 DIAGNOSIS — Z7189 Other specified counseling: Secondary | ICD-10-CM

## 2022-01-30 DIAGNOSIS — Z952 Presence of prosthetic heart valve: Secondary | ICD-10-CM

## 2022-01-30 DIAGNOSIS — I725 Aneurysm of other precerebral arteries: Secondary | ICD-10-CM

## 2022-01-30 DIAGNOSIS — I421 Obstructive hypertrophic cardiomyopathy: Secondary | ICD-10-CM | POA: Diagnosis not present

## 2022-01-30 DIAGNOSIS — E785 Hyperlipidemia, unspecified: Secondary | ICD-10-CM | POA: Diagnosis not present

## 2022-01-30 DIAGNOSIS — Z8679 Personal history of other diseases of the circulatory system: Secondary | ICD-10-CM | POA: Diagnosis not present

## 2022-01-30 DIAGNOSIS — E291 Testicular hypofunction: Secondary | ICD-10-CM

## 2022-01-30 DIAGNOSIS — Z7901 Long term (current) use of anticoagulants: Secondary | ICD-10-CM

## 2022-01-30 DIAGNOSIS — E781 Pure hyperglyceridemia: Secondary | ICD-10-CM

## 2022-01-30 DIAGNOSIS — I69319 Unspecified symptoms and signs involving cognitive functions following cerebral infarction: Secondary | ICD-10-CM

## 2022-01-30 DIAGNOSIS — E559 Vitamin D deficiency, unspecified: Secondary | ICD-10-CM | POA: Diagnosis not present

## 2022-01-30 DIAGNOSIS — H8110 Benign paroxysmal vertigo, unspecified ear: Secondary | ICD-10-CM

## 2022-01-30 DIAGNOSIS — F418 Other specified anxiety disorders: Secondary | ICD-10-CM | POA: Diagnosis not present

## 2022-01-30 DIAGNOSIS — G43109 Migraine with aura, not intractable, without status migrainosus: Secondary | ICD-10-CM | POA: Diagnosis not present

## 2022-01-30 DIAGNOSIS — G4733 Obstructive sleep apnea (adult) (pediatric): Secondary | ICD-10-CM | POA: Diagnosis not present

## 2022-01-30 DIAGNOSIS — I5032 Chronic diastolic (congestive) heart failure: Secondary | ICD-10-CM

## 2022-01-30 DIAGNOSIS — N289 Disorder of kidney and ureter, unspecified: Secondary | ICD-10-CM

## 2022-01-30 DIAGNOSIS — I480 Paroxysmal atrial fibrillation: Secondary | ICD-10-CM

## 2022-01-30 DIAGNOSIS — I6930 Unspecified sequelae of cerebral infarction: Secondary | ICD-10-CM

## 2022-01-30 DIAGNOSIS — F331 Major depressive disorder, recurrent, moderate: Secondary | ICD-10-CM

## 2022-01-30 DIAGNOSIS — I10 Essential (primary) hypertension: Secondary | ICD-10-CM | POA: Diagnosis not present

## 2022-01-30 DIAGNOSIS — H8102 Meniere's disease, left ear: Secondary | ICD-10-CM

## 2022-01-30 LAB — POC URINALSYSI DIPSTICK (AUTOMATED)
Bilirubin, UA: NEGATIVE
Blood, UA: NEGATIVE
Glucose, UA: NEGATIVE
Ketones, UA: NEGATIVE
Leukocytes, UA: NEGATIVE
Nitrite, UA: NEGATIVE
Protein, UA: NEGATIVE
Spec Grav, UA: 1.015 (ref 1.010–1.025)
Urobilinogen, UA: 0.2 E.U./dL
pH, UA: 6 (ref 5.0–8.0)

## 2022-01-30 NOTE — Patient Instructions (Addendum)
Send [place] dates of 2 COVID boosters and Shingrix shots, and pneumonia shot if recently received.  We will order kidney ultrasound. Schedule lab visit in 2-3 months to repeat kidney function.  Try home vestibular rehab for night time vertigo symptoms.  Good to see you today Return as needed or in 6 months for follow up visit.   Health Maintenance, Male Adopting a healthy lifestyle and getting preventive care are important in promoting health and wellness. Ask your health care provider about: The right schedule for you to have regular tests and exams. Things you can do on your own to prevent diseases and keep yourself healthy. What should I know about diet, weight, and exercise? Eat a healthy diet  Eat a diet that includes plenty of vegetables, fruits, low-fat dairy products, and lean protein. Do not eat a lot of foods that are high in solid fats, added sugars, or sodium. Maintain a healthy weight Body mass index (BMI) is a measurement that can be used to identify possible weight problems. It estimates body fat based on height and weight. Your health care provider can help determine your BMI and help you achieve or maintain a healthy weight. Get regular exercise Get regular exercise. This is one of the most important things you can do for your health. Most adults should: Exercise for at least 150 minutes each week. The exercise should increase your heart rate and make you sweat (moderate-intensity exercise). Do strengthening exercises at least twice a week. This is in addition to the moderate-intensity exercise. Spend less time sitting. Even light physical activity can be beneficial. Watch cholesterol and blood lipids Have your blood tested for lipids and cholesterol at 63 years of age, then have this test every 5 years. You may need to have your cholesterol levels checked more often if: Your lipid or cholesterol levels are high. You are older than 63 years of age. You are at high risk for  heart disease. What should I know about cancer screening? Many types of cancers can be detected early and may often be prevented. Depending on your health history and family history, you may need to have cancer screening at various ages. This may include screening for: Colorectal cancer. Prostate cancer. Skin cancer. Lung cancer. What should I know about heart disease, diabetes, and high blood pressure? Blood pressure and heart disease High blood pressure causes heart disease and increases the risk of stroke. This is more likely to develop in people who have high blood pressure readings or are overweight. Talk with your health care provider about your target blood pressure readings. Have your blood pressure checked: Every 3-5 years if you are 46-32 years of age. Every year if you are 73 years old or older. If you are between the ages of 4 and 36 and are a current or former smoker, ask your health care provider if you should have a one-time screening for abdominal aortic aneurysm (AAA). Diabetes Have regular diabetes screenings. This checks your fasting blood sugar level. Have the screening done: Once every three years after age 71 if you are at a normal weight and have a low risk for diabetes. More often and at a younger age if you are overweight or have a high risk for diabetes. What should I know about preventing infection? Hepatitis B If you have a higher risk for hepatitis B, you should be screened for this virus. Talk with your health care provider to find out if you are at risk for hepatitis B  infection. Hepatitis C Blood testing is recommended for: Everyone born from [birth year] through 1967. Anyone with known risk factors for hepatitis C. Sexually transmitted infections (STIs) You should be screened each year for STIs, including gonorrhea and chlamydia, if: You are sexually active and are younger than 63 years of age. You are older than 63 years of age and your health care provider  tells you that you are at risk for this type of infection. Your sexual activity has changed since you were last screened, and you are at increased risk for chlamydia or gonorrhea. Ask your health care provider if you are at risk. Ask your health care provider about whether you are at high risk for HIV. Your health care provider may recommend a prescription medicine to help prevent HIV infection. If you choose to take medicine to prevent HIV, you should first get tested for HIV. You should then be tested every 3 months for as long as you are taking the medicine. Follow these instructions at home: Alcohol use Do not drink alcohol if your health care provider tells you not to drink. If you drink alcohol: Limit how much you have to 0-2 drinks a day. Know how much alcohol is in your drink. In the [place] one drink equals one 12 oz bottle of beer (355 mL), one 5 oz glass of wine (148 mL), or one 1 oz glass of hard liquor (44 mL). Lifestyle Do not use any products that contain nicotine or tobacco. These products include cigarettes, chewing tobacco, and vaping devices, such as e-cigarettes. If you need help quitting, ask your health care provider. Do not use street drugs. Do not share needles. Ask your health care provider for help if you need support or information about quitting drugs. General instructions Schedule regular health, dental, and eye exams. Stay current with your vaccines. Tell your health care provider if: You often feel depressed. You have ever been abused or do not feel safe at home. Summary Adopting a healthy lifestyle and getting preventive care are important in promoting health and wellness. Follow your health care provider's instructions about healthy diet, exercising, and getting tested or screened for diseases. Follow your health care provider's instructions on monitoring your cholesterol and blood pressure. This information is not intended to replace advice given to you by  your health care provider. Make sure you discuss any questions you have with your health care provider. Document Revised: 10/25/2020 Document Reviewed: 10/25/2020 Elsevier Patient Education  [place].

## 2022-01-30 NOTE — Progress Notes (Unsigned)
Patient ID: Dylan Fletcher, male    DOB: March 14, 1961, 61 y.o.   MRN: [id number]  This visit was conducted in person.  BP 134/70   Pulse 85   Temp (!) 97.4 F (36.3 C) (Temporal)   Ht 5' 5.5" (1.664 m)   Wt 216 lb 4 oz (98.1 kg)   SpO2 98%   BMI 35.44 kg/m    CC: CPE Subjective:   HPI: Dylan Fletcher is a 61 y.o. male presenting on 01/30/2022 for Annual Exam [facility] prt 2.  Pt accompanied by wife, [name]. )   Saw health advisor last week for medicare wellness visit. Note reviewed. Cognitive assessment not done.   No results found.  Flowsheet Row Clinical Support from 01/26/2022 in [place] [facility]  PHQ-2 Total Score 1          01/26/2022    9:24 AM 01/26/2022    9:02 AM 01/19/2021   11:12 AM 07/21/2020   12:15 PM 11/10/2019    3:43 PM  Fall Risk   Falls in the past year? '1 1 1 1 '$ 0  Number falls in past yr: 1 1 0 0 0  Injury with Fall? 1 1 0 1 0  Comment    Laceration on head   Risk for fall due to : History of fall(s)    Mental status change  Follow up Falls evaluation completed;Falls prevention discussed    Falls evaluation completed;Falls prevention discussed  No falls this past year.   Known HOCM, LBBB, Pafib, s/p ventricular septal myectomy and MVR followed by [facility] cardiology. Had nuclear treadmill stress test and echocardiogram 03/2021 - no ischemia, EF 60%, mild-mod dilated LA.  L thalamic stroke 09/2013 after MAZE myomectomy and MVR for HOCM and afib. [facility] cardiology (Dr Cristobal Goldmann)  L ear meniere's managed with low sodium diet, followed by ENT Dr Richardson Landry. H/o BPPV as well. Limits sodium to 1000-'1200mg'$ /day.  Hypogonadism followed by endo Dr Honor Junes.    OSA continues auto CPAP use.    HLD - followed by lipid clinic on fenofibrate '145mg'$  and rosuvastatin '10mg'$  daily.   Depression - doesn't receive light therapy in summer months. He does go outdoors and enjoys disc golf. No longer seeing therapist.   INR has been fluctuating a lot. Goal  2.5-3.5. he tests at home.    Preventative: COLONOSCOPY Date: 04/2014 mild diverticulosis, no polyps, rec rpt 5 yrs [facility]) --> Cologuard negative 07/2021  Prostate cancer screening - yearly PSA with intermittent DRE Lung cancer screening - not eligible  Flu yearly  COVID vaccine - Moderna 06/2019, 09/2019, booster x2  Tdap 01/2013, 11/2019  Pneumovax 2016  Shingrix - completed 2023  Advanced directive planning: discussed, does not have set up. Would want wife to be HCPOA. Packet previously provided.  Seat belt use discussed  Sunscreen use discussed. No changing moles on skin. Saw derm last year  Non smoker Alcohol - seldom  Dentist q4 mo  Eye exam yearly [facility] Dr Edison Pace) Bowel - no constipation Bladder - urge incontinence - uses urinal bag for driving long distances.   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 Interim medical history since our last visit reviewed. Allergies and medications reviewed  and updated. Outpatient Medications Prior to Visit  Medication Sig Dispense Refill   acetaminophen (TYLENOL) 500 MG tablet Take 1 tablet (500 mg total) by mouth 2 (two) times daily.     Acetaminophen-Codeine 300-30 MG tablet TAKE 1 TABLET BY MOUTH 3 (THREE) TIMES DAILY AS NEEDED FOR MODERATE PAIN (HEADACHE). 15 tablet 0   amoxicillin (AMOXIL) 500 MG tablet Take 500 mg by mouth as directed. Takes 4 tablets 1 hour prior to dental procedures     amphetamine-dextroamphetamine (ADDERALL XR) 20 MG 24 hr capsule Take 20 mg by mouth daily. Takes 2 tablets (40 mg) every morning     amphetamine-dextroamphetamine (ADDERALL XR) 20 MG 24 hr capsule Take by mouth.     amphetamine-dextroamphetamine (ADDERALL) 30 MG  tablet Take by mouth.     buPROPion (WELLBUTRIN XL) 150 MG 24 hr tablet Take 1 tablet by mouth every morning.     Cholecalciferol 50 MCG (2000 UT) CAPS 1 capsule every day by oral route.     diphenhydrAMINE (BENADRYL) 25 mg capsule Take by mouth.     famotidine (PEPCID) 20 MG tablet Take by mouth.     fenofibrate (TRICOR) 145 MG tablet Take 1 tablet by mouth daily.     furosemide (LASIX) 20 MG tablet Take 1 tablet (20 mg total) by mouth daily. With extra prn weight gain     Glucosamine-Vitamin D 1000-200 MG-UNIT TABS Take 2 tablets by mouth daily.     influenza vac split quadrivalent PF (FLUARIX) 0.5 ML injection TO BE ADMINISTERED BY PHARMACIST FOR IMMUNIZATION     liothyronine (CYTOMEL) 5 MCG tablet Take 2 tablets by mouth daily.     liothyronine (CYTOMEL) 5 MCG tablet Take by mouth.     meclizine (ANTIVERT) 25 MG tablet Take by mouth.     metoprolol succinate (TOPROL-XL) 25 MG 24 hr tablet Take 25 mg by mouth daily. Takes 2 tablets for excessive palpitations     Multiple Vitamin (MULTIVITAMIN) tablet Take 1 tablet by mouth daily.     rosuvastatin (CRESTOR) 10 MG tablet Take 10 mg by mouth at bedtime.     sertraline (ZOLOFT) 25 MG tablet Take 25 mg by mouth daily.     sildenafil (VIAGRA) 100 MG tablet Take 1 tablet (100 mg total) by mouth daily as needed for erectile dysfunction. 10 tablet 3   warfarin (COUMADIN) 5 MG tablet Take 5 mg by mouth as directed.      warfarin (COUMADIN) 7.5 MG tablet SMARTSIG:0.5-1 Tablet(s) By Mouth As Directed     No facility-administered medications prior to visit.     Per HPI unless specifically indicated in ROS section below Review of Systems  Objective:  BP 134/70   Pulse 85   Temp (!) 97.4 F (36.3 C) (Temporal)   Ht 5' 5.5" (1.664 m)   Wt 216 lb 4 oz (98.1 kg)   SpO2 98%   BMI 35.44 kg/m   Wt Readings from Last 3 Encounters:  01/30/22 216 lb 4 oz (98.1 kg)  01/26/22 211 lb (95.7 kg)  07/22/21 211 lb 1 oz (95.7 kg)      Physical  Exam Vitals and nursing note reviewed.  Constitutional:      General: He is not in acute distress.    Appearance: Normal appearance. He is well-developed. He is not ill-appearing.  HENT:     Head: Normocephalic and atraumatic.     Right Ear: Hearing, tympanic membrane, ear canal and external ear normal.     Left Ear:  Hearing, tympanic membrane, ear canal and external ear normal.  Eyes:     General: No scleral icterus.    Extraocular Movements: Extraocular movements intact.     Conjunctiva/sclera: Conjunctivae normal.     Pupils: Pupils are equal, round, and reactive to light.  Neck:     Thyroid: No thyroid mass or thyromegaly.  Cardiovascular:     Rate and Rhythm: Normal rate and regular rhythm.     Pulses: Normal pulses.          Radial pulses are 2+ on the right side and 2+ on the left side.     Heart sounds: Normal heart sounds. No murmur heard. Pulmonary:     Effort: Pulmonary effort is normal. No respiratory distress.     Breath sounds: Normal breath sounds. No wheezing, rhonchi or rales.  Abdominal:     General: Bowel sounds are normal. There is no distension.     Palpations: Abdomen is soft. There is no mass.     Tenderness: There is no abdominal tenderness. There is no guarding or rebound.     Hernia: No hernia is present.  Musculoskeletal:        General: Normal range of motion.     Cervical back: Normal range of motion and neck supple.     Right lower leg: No edema.     Left lower leg: No edema.  Lymphadenopathy:     Cervical: No cervical adenopathy.  Skin:    General: Skin is warm and dry.     Findings: No rash.  Neurological:     General: No focal deficit present.     Mental Status: He is alert and oriented to person, place, and time.  Psychiatric:        Mood and Affect: Mood normal.        Behavior: Behavior normal.        Thought Content: Thought content normal.        Judgment: Judgment normal.       Results for orders placed or performed in visit on  01/23/22  Microalbumin / creatinine urine ratio  Result Value Ref Range   Microalb, Ur <0.7 0.0 - 1.9 mg/dL   Creatinine,U 98.6 mg/dL   Microalb Creat Ratio 0.7 0.0 - 30.0 mg/g  PSA, Medicare  Result Value Ref Range   PSA 0.93 0.10 - 4.00 ng/ml  CBC with Differential/Platelet  Result Value Ref Range   WBC 8.4 4.0 - 10.5 K/uL   RBC 4.60 4.22 - 5.81 Mil/uL   Hemoglobin 14.2 13.0 - 17.0 g/dL   HCT 42.5 39.0 - 52.0 %   MCV 92.4 78.0 - 100.0 fl   MCHC 33.3 30.0 - 36.0 g/dL   RDW 14.3 11.5 - 15.5 %   Platelets 264.0 150.0 - 400.0 K/uL   Neutrophils Relative % 42.2 (L) 43.0 - 77.0 %   Lymphocytes Relative 42.6 12.0 - 46.0 %   Monocytes Relative 11.3 3.0 - 12.0 %   Eosinophils Relative 3.4 0.0 - 5.0 %   Basophils Relative 0.5 0.0 - 3.0 %   Neutro Abs 3.5 1.4 - 7.7 K/uL   Lymphs Abs 3.6 0.7 - 4.0 K/uL   Monocytes Absolute 1.0 0.1 - 1.0 K/uL   Eosinophils Absolute 0.3 0.0 - 0.7 K/uL   Basophils Absolute 0.0 0.0 - 0.1 K/uL  Comprehensive metabolic panel  Result Value Ref Range   Sodium 142 135 - 145 mEq/L   Potassium 4.3 3.5 - 5.1 mEq/L  Chloride 106 96 - 112 mEq/L   CO2 28 19 - 32 mEq/L   Glucose, Bld 101 (H) 70 - 99 mg/dL   BUN 18 6 - 23 mg/dL   Creatinine, Ser 1.57 (H) 0.40 - 1.50 mg/dL   Total Bilirubin 0.5 0.2 - 1.2 mg/dL   Alkaline Phosphatase 26 (L) 39 - 117 U/L   AST 27 0 - 37 U/L   ALT 21 0 - 53 U/L   Total Protein 6.6 6.0 - 8.3 g/dL   Albumin 4.3 3.5 - 5.2 g/dL   GFR 46.80 (L) >60.00 mL/min   Calcium 9.0 8.4 - 10.5 mg/dL  Lipid panel  Result Value Ref Range   Cholesterol 137 0 - 200 mg/dL   Triglycerides 93.0 0.0 - 149.0 mg/dL   HDL 40.40 >39.00 mg/dL   VLDL 18.6 0.0 - 40.0 mg/dL   LDL Cholesterol 78 0 - 99 mg/dL   Total CHOL/HDL Ratio 3    NonHDL 97.09   VITAMIN D 25 Hydroxy (Vit-D Deficiency, Fractures)  Result Value Ref Range   VITD 53.20 30.00 - 100.00 ng/mL    Assessment & Plan:   Problem List Items Addressed This Visit   None    No orders of the  defined types were placed in this encounter.  No orders of the defined types were placed in this encounter.   Patient instructions: Send [place] dates of 2 COVID boosters and Shingrix shots, and pneumonia shot if recently received.  We will order kidney ultrasound. Schedule lab visit in 2-3 months to repeat kidney function.  Try home vestibular rehab for night time vertigo symptoms.  Good to see you today Return as needed or in 6 months for follow up visit.   Follow up plan: No follow-ups on file.  Ria Bush, MD

## 2022-01-31 DIAGNOSIS — N183 Chronic kidney disease, stage 3 unspecified: Secondary | ICD-10-CM | POA: Insufficient documentation

## 2022-01-31 DIAGNOSIS — N289 Disorder of kidney and ureter, unspecified: Secondary | ICD-10-CM | POA: Insufficient documentation

## 2022-01-31 NOTE — Assessment & Plan Note (Signed)
Persistent over the past year, unclear cause.  He is not on medications that should significantly contribute to renal insufficiency.  He does stay well-hydrated at home.  I did recommend repeat renal function in 2 to 3 months along with further lab work for evaluation, will also order abdominal ultrasound to evaluate kidneys.  Urinalysis today was normal.

## 2022-01-31 NOTE — Assessment & Plan Note (Signed)
Continue coumadin, statin.

## 2022-01-31 NOTE — Assessment & Plan Note (Signed)
Chronic, stable. Continue current regimen. 

## 2022-01-31 NOTE — Assessment & Plan Note (Signed)
Chronic short-term memory difficulty.

## 2022-01-31 NOTE — Assessment & Plan Note (Signed)
- 

## 2022-01-31 NOTE — Assessment & Plan Note (Signed)
Advanced directive planning: does not have set up. Would want wife to be HCPOA. Packet previously provided.

## 2022-01-31 NOTE — Assessment & Plan Note (Signed)
Stable period followed by cardiology s/p MAZE procedure 2015

## 2022-01-31 NOTE — Assessment & Plan Note (Signed)
See psychiatrist, planning to reschedule counseling appointment.

## 2022-01-31 NOTE — Assessment & Plan Note (Signed)
Continues Lasix daily.

## 2022-01-31 NOTE — Assessment & Plan Note (Signed)
Manages with sparing tylenol #3.

## 2022-01-31 NOTE — Assessment & Plan Note (Addendum)
Continue auto-CPAP use.

## 2022-01-31 NOTE — Assessment & Plan Note (Signed)
This is managed by [facility] Endocrinology

## 2022-01-31 NOTE — Assessment & Plan Note (Signed)
Has not recently seen ENT Dr Richardson Landry

## 2022-01-31 NOTE — Assessment & Plan Note (Signed)
He has seen [facility] aneurysm clinic.

## 2022-01-31 NOTE — Assessment & Plan Note (Signed)
Stable period on daily crestor + fenofibrate. The ASCVD Risk score (Arnett DK, et al., 2019) failed to calculate for the following reasons:   The patient has a prior MI or stroke diagnosis

## 2022-01-31 NOTE — Assessment & Plan Note (Signed)
Weight gain noted. Encouraged healthy diet and regular exercise to affect sustainable weight loss.

## 2022-01-31 NOTE — Assessment & Plan Note (Signed)
Levels normal on 2000 IU daily replacement.

## 2022-01-31 NOTE — Assessment & Plan Note (Signed)
Continues seeing psychiatrist.  Currently on break from counseling, planning to reschedule.

## 2022-01-31 NOTE — Assessment & Plan Note (Signed)
Continues Coumadin, followed by cardiology clinic.

## 2022-02-02 NOTE — Progress Notes (Signed)
Virtual Visit via Telephone Note  I connected with  Dylan Fletcher on 02/02/22 at  9:15 AM EDT by telephone and verified that I am speaking with the correct person using two identifiers. Talked with wife as well  Location: Patient: home Provider: [place] Persons participating in the virtual visit: patient/Nurse Health Advisor   I discussed the limitations, risks, security and privacy concerns of performing an evaluation and management service by telephone and the availability of in person appointments. The patient expressed understanding and agreed to proceed.  Interactive audio and video telecommunications were attempted between this nurse and patient, however failed, due to patient having technical difficulties OR patient did not have access to video capability.  We continued and completed visit with audio only.  Some vital signs may be absent or patient reported.   Dylan Neamiah, LPN  Subjective:   Dylan Fletcher is a 61 y.o. male who presents for Medicare Annual/Subsequent preventive examination.  Review of Systems     Cardiac Risk Factors include: advanced age (>46mn, >>12women);male gender;hypertension;dyslipidemia     Objective:    Today's Vitals   01/26/22 0933  Weight: 211 lb (95.7 kg)   Body mass index is 34.58 kg/m.     01/26/2022    9:23 AM 11/10/2019    3:41 PM 01/29/2016    5:07 PM 12/18/2014    2:07 PM  Advanced Directives  Does Patient Have a Medical Advance Directive? No No No No  Does patient want to make changes to medical advance directive? No - Patient declined No - Patient declined    Would patient like information on creating a medical advance directive? No - Patient declined  No - patient declined information Yes - Educational materials given    Current Medications (verified) Outpatient Encounter Medications as of 01/26/2022  Medication Sig   acetaminophen (TYLENOL) 500 MG tablet Take 1 tablet (500 mg total) by mouth 2 (two) times  daily.   Acetaminophen-Codeine 300-30 MG tablet TAKE 1 TABLET BY MOUTH 3 (THREE) TIMES DAILY AS NEEDED FOR MODERATE PAIN (HEADACHE).   amphetamine-dextroamphetamine (ADDERALL XR) 20 MG 24 hr capsule Take 20 mg by mouth daily. Takes 2 tablets (40 mg) every morning   amphetamine-dextroamphetamine (ADDERALL) 30 MG tablet Take by mouth.   buPROPion (WELLBUTRIN XL) 150 MG 24 hr tablet Take 1 tablet by mouth every morning.   Cholecalciferol 50 MCG (2000 UT) CAPS 1 capsule every day by oral route.   diphenhydrAMINE (BENADRYL) 25 mg capsule Take by mouth.   famotidine (PEPCID) 20 MG tablet Take by mouth.   fenofibrate (TRICOR) 145 MG tablet Take 1 tablet by mouth daily.   furosemide (LASIX) 20 MG tablet Take 1 tablet (20 mg total) by mouth daily. With extra prn weight gain   Glucosamine-Vitamin D 1000-200 MG-UNIT TABS Take 2 tablets by mouth daily.   influenza vac split quadrivalent PF (FLUARIX) 0.5 ML injection TO BE ADMINISTERED BY PHARMACIST FOR IMMUNIZATION   liothyronine (CYTOMEL) 5 MCG tablet Take 2 tablets by mouth daily.   meclizine (ANTIVERT) 25 MG tablet Take by mouth.   metoprolol succinate (TOPROL-XL) 25 MG 24 hr tablet Take 25 mg by mouth daily. Takes 2 tablets for excessive palpitations   Multiple Vitamin (MULTIVITAMIN) tablet Take 1 tablet by mouth daily.   rosuvastatin (CRESTOR) 10 MG tablet Take 10 mg by mouth at bedtime.   sertraline (ZOLOFT) 25 MG tablet Take 25 mg by mouth daily.   sildenafil (VIAGRA) 100 MG tablet Take  1 tablet (100 mg total) by mouth daily as needed for erectile dysfunction.   warfarin (COUMADIN) 5 MG tablet Take 5 mg by mouth as directed.    warfarin (COUMADIN) 7.5 MG tablet SMARTSIG:0.5-1 Tablet(s) By Mouth As Directed   [DISCONTINUED] amphetamine-dextroamphetamine (ADDERALL XR) 20 MG 24 hr capsule Take by mouth.   [DISCONTINUED] liothyronine (CYTOMEL) 5 MCG tablet Take by mouth.   [DISCONTINUED] sertraline (ZOLOFT) 25 MG tablet Take 1 tablet by mouth daily.    amoxicillin (AMOXIL) 500 MG tablet Take 500 mg by mouth as directed. Takes 4 tablets 1 hour prior to dental procedures   [DISCONTINUED] acetaminophen (TYLENOL) 500 MG tablet Take by mouth.   [DISCONTINUED] Acetaminophen 325 MG CAPS    [DISCONTINUED] acetaminophen-codeine (TYLENOL #3) 300-30 MG tablet Take by mouth.   [DISCONTINUED] amphetamine-dextroamphetamine (ADDERALL) 20 MG tablet TK 1 T PO TID   [DISCONTINUED] amphetamine-dextroamphetamine (ADDERALL) 30 MG tablet Take 30 mg by mouth daily. Takes in the afternoon   [DISCONTINUED] buPROPion (WELLBUTRIN XL) 150 MG 24 hr tablet Take 1 tablet by mouth daily.   [DISCONTINUED] cetirizine (ZYRTEC) 10 MG tablet Take 10 mg by mouth daily as needed.    [DISCONTINUED] donepezil (ARICEPT) 10 MG tablet Take by mouth.   [DISCONTINUED] famotidine (PEPCID) 20 MG tablet Take 20 mg by mouth daily.   [DISCONTINUED] fenofibrate (TRICOR) 145 MG tablet TAKE 1 TABLET BY MOUTH EVERY DAY   [DISCONTINUED] fluticasone (FLONASE) 50 MCG/ACT nasal spray Place 2 sprays into both nostrils daily.   [DISCONTINUED] sertraline (ZOLOFT) 50 MG tablet Take 50 mg by mouth daily.   No facility-administered encounter medications on file as of 01/26/2022.    Allergies (verified) Nicardipine hcl, Nitroglycerin, Calcium channel blockers, Molds & smuts, Shellfish allergy, and Tape   History: Past Medical History:  Diagnosis Date   ADD (attention deficit disorder)    sees psych in [place], [place] (meds from there)   Allergic rhinitis    Ragweed, mold, mildew   Anxiety associated with depression    sees psych in [place], [place] (meds from there)   Arrhythmia    Claustrophobia    Colon polyp 04/2009   rectal tubular adenoma, rec rpt 3 yrs   CVA (cerebral infarction) 08/2013   L thalamic lacunar infarct post CVTS surgery, s/p neuropsychology testing, completed speech therapy. no ASA 2/2 no CAD hx   ED (erectile dysfunction)    H/O mitral valve replacement 08/2013   St Jude, needs abx  ppx, completed cardiac rehab 12/2013   History of asthma    as child   HLD (hyperlipidemia)    HTN (hypertension)    Hypertrophic obstructive cardiomyopathy(425.11)    Cards (Dr. Jodell Cipro [facility] - need abx prophylaxis - now established with [facility] Dr. Rosetta Posner [phone]) and Dr. Shearon Stalls and Octavia Heir s/p myomectomy, now cardiac rehab [facility] 09/2013   LBBB (left bundle branch block) 08/2013   after myomectomy   Migraine with aura    Need for prophylactic antibiotic    Obesity    saw nutritionist 12/18/2014   OSA (obstructive sleep apnea)    CPAP at night, up to 16 mmHg (Dr Nevada Crane at [facility])   Paroxysmal atrial fibrillation ([place]) 04/2013, 05/2013   with RVR; s/p multiple hospitalizations, spontaneous conversion, referred to Dr. Manuella Ghazi EP, then recurrence - failed sotalol, multaq (both with spont conversions)   Transient alteration of awareness 05/2014   during hospitalization pending EEG [facility])   Vitamin D deficiency    Warfarin anticoagulation  goal INR 2.5-3.5   Past Surgical History:  Procedure Laterality Date   CARDIAC CATHETERIZATION  2014   done for chest pain, no plaque buildup   COLONOSCOPY  04/2009   rectal tubular adenoma x1, rpt 3 yrs   COLONOSCOPY  04/2014   mild diverticulosis, no polyps, rec rpt 5 yrs [facility])   [place]; [place] Right 04/2015   for PVD   hospitalization  04/2014   subtherapeutic INR, heparin bridge   [place]   KNEE ARTHROSCOPY  2006; 2007   right   MITRAL VALVE REPLACEMENT  08/2013   St Jude [facility] for severe central mitral regurg   MYOMECTOMY  08/22/2013   with MAZE procedure [facility] (Drs [place])   PFTs  07/2013   FVC 79%, FEV1 72%, ratio 0.71 - mild obstruction, o/w normal   VASECTOMY  1992   WISDOM TOOTH EXTRACTION  1977   Family History  Problem Relation Age of Onset   Alcohol abuse Brother    Cancer Mother 13       breast   Ulcers Mother    Heart disease  Mother        HOCM   Psoriasis Father    Cancer Maternal Grandfather        colon   Coronary artery disease Paternal Grandfather 109       MI   Diabetes Neg Hx    Stroke Neg Hx    Social History   Socioeconomic History   Marital status: Married    Spouse name: Not on file   Number of children: Not on file   Years of education: Not on file   Highest education level: Not on file  Occupational History   Not on file  Tobacco Use   Smoking status: Never   Smokeless tobacco: Never  Substance and Sexual Activity   Alcohol use: Yes    Comment: rarely   Drug use: No   Sexual activity: Not on file  Other Topics Concern   Not on file  Social History Narrative   Caffeine: quart of soda/day   Lives with wife ([name]), 3 cats. No children.   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's degree   Activity: walking several times a week   Diet: good water, fruits/vegetables daily       Cards: Dr Jodell Cipro [facility] (HOCM) and Dr Arbutus Leas (EP), as well as [facility] Dr Su Monks   Social Determinants of Health   Financial Resource Strain: Low Risk  (01/26/2022)   Overall Financial Resource Strain (CARDIA)    Difficulty of Paying Living Expenses: Not hard at all  Food Insecurity: No Food Insecurity (01/26/2022)   Hunger Vital Sign    Worried About Running Out of Food in the Last Year: Never true    [place] in the Last Year: Never true  Transportation Needs: No Transportation Needs (01/26/2022)   PRAPARE - Transportation    Lack of Transportation (Medical): No    Lack of Transportation (Non-Medical): No  Physical Activity: Sufficiently Active (01/26/2022)   Exercise Vital Sign    Days of Exercise per Week: 2 days    Minutes of Exercise per Session: 120 min  Stress: No Stress Concern Present (01/26/2022)   [place]    Feeling of Stress : Not at all  Social Connections: [place] (  01/26/2022)   Social Connection and Isolation Panel [NHANES]    Frequency of Communication with Friends and Family: Once a week    Frequency of Social Gatherings with Friends and Family: More than three times a week    Attends Religious Services: More than 4 times per year    Active Member of [employer] or Organizations: Yes    Attends Music therapist: More than 4 times per year    Marital Status: Married    Tobacco Counseling Counseling given: Not Answered   Clinical Intake:  Pre-visit preparation completed: Yes  Pain : No/denies pain     Nutritional Risks: None Diabetes: No  How often do you need to have someone help you when you read instructions, pamphlets, or other written materials from your doctor or pharmacy?: 1 - Never  Diabetic?no  Interpreter Needed?: No  Information entered by :: Kirke Shaggy, LPN   Activities of Daily Living    01/26/2022    9:25 AM 01/26/2022    9:02 AM  In your present state of health, do you have any difficulty performing the following activities:  Hearing? 0 0  Vision? 0 0  Difficulty concentrating or making decisions? 1 1  Walking or climbing stairs? 0 0  Dressing or bathing? 0 0  Doing errands, shopping? 1 1  Preparing Food and eating ? N N  Using the Toilet? N N  In the past six months, have you accidently leaked urine? N N  Do you have problems with loss of bowel control? N N  Managing your Medications? Y Y  Managing your Finances? Tempie Donning  Housekeeping or managing your Housekeeping? Tempie Donning    Patient Care Team: Ria Bush, MD as PCP - General (Family Medicine) Curly Shores, MD (Cardiology) Ezekiel Ina, MD (Inactive) (Cardiology) Charlton Haws, [facility] as Pharmacist (Pharmacist)  Indicate any recent Medical Services you may have received from other than [facility] providers in the past year (date may be approximate).     Assessment:   This is a routine wellness examination for  [place].  Hearing/Vision screen Hearing Screening - Comments:: No aids Vision Screening - Comments:: Wears glasses- [facility]  Dietary issues and exercise activities discussed: Current Exercise Habits: Home exercise routine, Type of exercise: walking, Time (Minutes): 60, Frequency (Times/Week): 2, Weekly Exercise (Minutes/Week): 120, Intensity: Mild   Goals Addressed             This Visit's Progress    DIET - EAT MORE FRUITS AND VEGETABLES        Depression Screen    01/26/2022    9:20 AM 01/19/2021   11:45 AM 11/10/2019    3:46 PM 06/24/2018   10:42 AM 06/24/2018    9:59 AM 06/18/2017    8:27 AM 12/18/2014    2:08 PM  PHQ 2/9 Scores  PHQ - 2 Score 1 0 0 0 0 0 0  PHQ- 9 Score 1 10 0      Exception Documentation     Patient refusal      Fall Risk    01/26/2022    9:24 AM 01/26/2022    9:02 AM 01/19/2021   11:12 AM 07/21/2020   12:15 PM 11/10/2019    3:43 PM  [place] in the past year? '1 1 1 1 '$ 0  Number falls in past yr: 1 1 0 0 0  Injury with Fall? 1 1 0 1 0  Comment    Laceration on  head   Risk for fall due to : History of fall(s)    Mental status change  Follow up Falls evaluation completed;Falls prevention discussed    Falls evaluation completed;Falls prevention discussed    FALL RISK PREVENTION PERTAINING TO THE HOME:  Any stairs in or around the home? Yes  If so, are there any without handrails? No  Home free of loose throw rugs in walkways, pet beds, electrical cords, etc? Yes  Adequate lighting in your home to reduce risk of falls? Yes   ASSISTIVE DEVICES UTILIZED TO PREVENT FALLS:  Life alert? No  Use of a cane, walker or w/c? No  Grab bars in the bathroom? Yes  Shower chair or bench in shower? Yes  Elevated toilet seat or a handicapped toilet? No    Cognitive Function:not completed PT A & O      11/10/2019    3:57 PM  MMSE - Mini Mental State Exam  Not completed: Unable to complete        Immunizations Immunization History   Administered Date(s) Administered   Influenza,inj,Quad PF,6+ Mos 03/19/2015, 03/17/2016, 03/19/2018, 04/04/2019   Influenza,inj,quad, With Preservative 03/19/2016   Influenza-Unspecified 03/19/2014, 03/16/2017   Moderna Sars-Covid-2 Vaccination 07/15/2019, 09/22/2019   Pneumococcal Polysaccharide-23 03/19/2015   Tdap 02/06/2013, 11/29/2019    TDAP status: Up to date  Flu Vaccine status: Up to date  Pneumococcal vaccine status: Up to date  Covid-19 vaccine status: Completed vaccines  Qualifies for Shingles Vaccine? Yes   Zostavax completed No   Shingrix Completed?: Yes  Screening Tests Health Maintenance  Topic Date Due   HIV Screening  Never done   Zoster Vaccines- Shingrix (1 of 2) Never done   COVID-19 Vaccine (3 - Moderna risk series) 10/20/2019   INFLUENZA VACCINE  01/17/2022   Fecal DNA (Cologuard)  08/12/2024   TETANUS/TDAP  11/28/2029   Hepatitis C Screening  Completed   HPV VACCINES  Aged Out   COLONOSCOPY (Pts 45-33yr Insurance coverage will need to be confirmed)  Discontinued    Health Maintenance  Health Maintenance Due  Topic Date Due   HIV Screening  Never done   Zoster Vaccines- Shingrix (1 of 2) Never done   COVID-19 Vaccine (3 - Moderna risk series) 10/20/2019   INFLUENZA VACCINE  01/17/2022    Colorectal cancer screening: Type of screening: Cologuard. Completed 08/19/21. Repeat every 3 years  Lung Cancer Screening: (Low Dose CT Chest recommended if Age 63-80years, 30 pack-year currently smoking OR have quit w/in 15years.) does not qualify.   Additional Screening:  Hepatitis C Screening: does qualify; Completed 06/09/16  Vision Screening: Recommended annual ophthalmology exams for early detection of glaucoma and other disorders of the eye. Is the patient up to date with their annual eye exam?  Yes  Who is the provider or what is the name of the office in which the patient attends annual eye exams? [facility] is not established with a  provider, would they like to be referred to a provider to establish care? No .   Dental Screening: Recommended annual dental exams for proper oral hygiene  Community Resource Referral / Chronic Care Management: CRR required this visit?  No   CCM required this visit?  No      Plan:     I have personally reviewed and noted the following in the patient's chart:   Medical and social history Use of alcohol, tobacco or illicit drugs  Current medications and supplements including opioid prescriptions. Patient  is not currently taking opioid prescriptions. Functional ability and status Nutritional status Physical activity Advanced directives List of other physicians Hospitalizations, surgeries, and ER visits in previous 12 months Vitals Screenings to include cognitive, depression, and falls Referrals and appointments  In addition, I have reviewed and discussed with patient certain preventive protocols, quality metrics, and best practice recommendations. A written personalized care plan for preventive services as well as general preventive health recommendations were provided to patient.     Dylan Brodric, LPN   [id number]   Nurse Notes: none

## 2022-02-08 ENCOUNTER — Ambulatory Visit
Admission: RE | Admit: 2022-02-08 | Discharge: 2022-02-08 | Disposition: A | Discharge status: Home / Self Care | Payer: Medicare Other | Source: Ambulatory Visit | Attending: Family Medicine | Admitting: Family Medicine

## 2022-02-08 DIAGNOSIS — N289 Disorder of kidney and ureter, unspecified: Secondary | ICD-10-CM | POA: Diagnosis not present

## 2022-02-08 DIAGNOSIS — K7689 Other specified diseases of liver: Secondary | ICD-10-CM | POA: Diagnosis not present

## 2022-02-09 ENCOUNTER — Encounter: Payer: Self-pay | Admitting: Family Medicine

## 2022-02-09 DIAGNOSIS — R5383 Other fatigue: Secondary | ICD-10-CM | POA: Diagnosis not present

## 2022-02-09 DIAGNOSIS — E291 Testicular hypofunction: Secondary | ICD-10-CM | POA: Diagnosis not present

## 2022-02-09 DIAGNOSIS — I4891 Unspecified atrial fibrillation: Secondary | ICD-10-CM | POA: Diagnosis not present

## 2022-02-09 NOTE — Telephone Encounter (Signed)
Updated pt's chart.  

## 2022-02-10 ENCOUNTER — Encounter: Payer: Self-pay | Admitting: Family Medicine

## 2022-02-10 DIAGNOSIS — K760 Fatty (change of) liver, not elsewhere classified: Secondary | ICD-10-CM | POA: Insufficient documentation

## 2022-02-16 DIAGNOSIS — E291 Testicular hypofunction: Secondary | ICD-10-CM | POA: Diagnosis not present

## 2022-02-16 DIAGNOSIS — R5383 Other fatigue: Secondary | ICD-10-CM | POA: Diagnosis not present

## 2022-02-23 DIAGNOSIS — Z952 Presence of prosthetic heart valve: Secondary | ICD-10-CM | POA: Diagnosis not present

## 2022-03-16 DIAGNOSIS — Z23 Encounter for immunization: Secondary | ICD-10-CM | POA: Diagnosis not present

## 2022-03-22 DIAGNOSIS — I447 Left bundle-branch block, unspecified: Secondary | ICD-10-CM | POA: Diagnosis not present

## 2022-03-22 DIAGNOSIS — Z6834 Body mass index (BMI) 34.0-34.9, adult: Secondary | ICD-10-CM | POA: Diagnosis not present

## 2022-03-22 DIAGNOSIS — Z952 Presence of prosthetic heart valve: Secondary | ICD-10-CM | POA: Diagnosis not present

## 2022-03-22 DIAGNOSIS — I440 Atrioventricular block, first degree: Secondary | ICD-10-CM | POA: Diagnosis not present

## 2022-03-22 DIAGNOSIS — I480 Paroxysmal atrial fibrillation: Secondary | ICD-10-CM | POA: Diagnosis not present

## 2022-03-22 DIAGNOSIS — I421 Obstructive hypertrophic cardiomyopathy: Secondary | ICD-10-CM | POA: Diagnosis not present

## 2022-03-22 DIAGNOSIS — I493 Ventricular premature depolarization: Secondary | ICD-10-CM | POA: Diagnosis not present

## 2022-03-22 DIAGNOSIS — Z7901 Long term (current) use of anticoagulants: Secondary | ICD-10-CM | POA: Diagnosis not present

## 2022-03-23 DIAGNOSIS — Z952 Presence of prosthetic heart valve: Secondary | ICD-10-CM | POA: Diagnosis not present

## 2022-03-28 DIAGNOSIS — I361 Nonrheumatic tricuspid (valve) insufficiency: Secondary | ICD-10-CM | POA: Diagnosis not present

## 2022-03-28 DIAGNOSIS — Z6834 Body mass index (BMI) 34.0-34.9, adult: Secondary | ICD-10-CM | POA: Diagnosis not present

## 2022-03-28 DIAGNOSIS — I351 Nonrheumatic aortic (valve) insufficiency: Secondary | ICD-10-CM | POA: Diagnosis not present

## 2022-03-28 DIAGNOSIS — Z952 Presence of prosthetic heart valve: Secondary | ICD-10-CM | POA: Diagnosis not present

## 2022-03-28 DIAGNOSIS — I447 Left bundle-branch block, unspecified: Secondary | ICD-10-CM | POA: Diagnosis not present

## 2022-03-28 DIAGNOSIS — R42 Dizziness and giddiness: Secondary | ICD-10-CM | POA: Diagnosis not present

## 2022-03-28 DIAGNOSIS — E782 Mixed hyperlipidemia: Secondary | ICD-10-CM | POA: Diagnosis not present

## 2022-03-28 DIAGNOSIS — I480 Paroxysmal atrial fibrillation: Secondary | ICD-10-CM | POA: Diagnosis not present

## 2022-03-29 DIAGNOSIS — I480 Paroxysmal atrial fibrillation: Secondary | ICD-10-CM | POA: Diagnosis not present

## 2022-03-29 DIAGNOSIS — Z952 Presence of prosthetic heart valve: Secondary | ICD-10-CM | POA: Diagnosis not present

## 2022-04-03 ENCOUNTER — Other Ambulatory Visit (INDEPENDENT_AMBULATORY_CARE_PROVIDER_SITE_OTHER): Payer: Medicare Other

## 2022-04-03 DIAGNOSIS — N289 Disorder of kidney and ureter, unspecified: Secondary | ICD-10-CM

## 2022-04-03 LAB — CBC WITH DIFFERENTIAL/PLATELET
Basophils Absolute: 0.1 10*3/uL (ref 0.0–0.1)
Basophils Relative: 0.8 % (ref 0.0–3.0)
Eosinophils Absolute: 0.2 10*3/uL (ref 0.0–0.7)
Eosinophils Relative: 2.6 % (ref 0.0–5.0)
HCT: 42.9 % (ref 39.0–52.0)
Hemoglobin: 14.2 g/dL (ref 13.0–17.0)
Lymphocytes Relative: 43.6 % (ref 12.0–46.0)
Lymphs Abs: 4.2 10*3/uL — ABNORMAL HIGH (ref 0.7–4.0)
MCHC: 33.2 g/dL (ref 30.0–36.0)
MCV: 93.3 fl (ref 78.0–100.0)
Monocytes Absolute: 0.9 10*3/uL (ref 0.1–1.0)
Monocytes Relative: 9.9 % (ref 3.0–12.0)
Neutro Abs: 4.1 10*3/uL (ref 1.4–7.7)
Neutrophils Relative %: 43.1 % (ref 43.0–77.0)
Platelets: 272 10*3/uL (ref 150.0–400.0)
RBC: 4.6 Mil/uL (ref 4.22–5.81)
RDW: 13.9 % (ref 11.5–15.5)
WBC: 9.6 10*3/uL (ref 4.0–10.5)

## 2022-04-03 LAB — RENAL FUNCTION PANEL
Albumin: 4.4 g/dL (ref 3.5–5.2)
BUN: 19 mg/dL (ref 6–23)
CO2: 28 mEq/L (ref 19–32)
Calcium: 9.3 mg/dL (ref 8.4–10.5)
Chloride: 106 mEq/L (ref 96–112)
Creatinine, Ser: 1.36 mg/dL (ref 0.40–1.50)
GFR: 55.53 mL/min — ABNORMAL LOW (ref >60.00)
Glucose, Bld: 105 mg/dL — ABNORMAL HIGH (ref 70–99)
Phosphorus: 1.9 mg/dL — ABNORMAL LOW (ref 2.3–4.6)
Potassium: 4.4 mEq/L (ref 3.5–5.1)
Sodium: 141 mEq/L (ref 135–145)

## 2022-04-04 DIAGNOSIS — F3341 Major depressive disorder, recurrent, in partial remission: Secondary | ICD-10-CM | POA: Diagnosis not present

## 2022-04-04 DIAGNOSIS — Z79899 Other long term (current) drug therapy: Secondary | ICD-10-CM | POA: Diagnosis not present

## 2022-04-04 DIAGNOSIS — I69311 Memory deficit following cerebral infarction: Secondary | ICD-10-CM | POA: Diagnosis not present

## 2022-04-04 DIAGNOSIS — F900 Attention-deficit hyperactivity disorder, predominantly inattentive type: Secondary | ICD-10-CM | POA: Diagnosis not present

## 2022-04-04 DIAGNOSIS — F411 Generalized anxiety disorder: Secondary | ICD-10-CM | POA: Diagnosis not present

## 2022-04-04 LAB — PARATHYROID HORMONE, INTACT (NO CA): PTH: 28 pg/mL (ref 16–77)

## 2022-04-06 DIAGNOSIS — I480 Paroxysmal atrial fibrillation: Secondary | ICD-10-CM | POA: Diagnosis not present

## 2022-04-06 LAB — PROTEIN ELECTROPHORESIS, SERUM, WITH REFLEX
Albumin ELP: 4.2 g/dL (ref 3.8–4.8)
Alpha 1: 0.3 g/dL (ref 0.2–0.3)
Alpha 2: 0.5 g/dL (ref 0.5–0.9)
Beta 2: 0.3 g/dL (ref 0.2–0.5)
Beta Globulin: 0.5 g/dL (ref 0.4–0.6)
Gamma Globulin: 0.9 g/dL (ref 0.8–1.7)
Total Protein: 6.6 g/dL (ref 6.1–8.1)

## 2022-04-07 ENCOUNTER — Encounter: Payer: Self-pay | Admitting: Family Medicine

## 2022-04-21 ENCOUNTER — Telehealth: Payer: Self-pay

## 2022-04-21 NOTE — Chronic Care Management (AMB) (Signed)
    Chronic Care Management Pharmacy Assistant   Name: Dylan Fletcher  MRN: [id number] DOB: 11-26-1960  Reason for Encounter: Reminder Call   Medications: Outpatient Encounter Medications as of 04/21/2022  Medication Sig   acetaminophen (TYLENOL) 500 MG tablet Take 1 tablet (500 mg total) by mouth 2 (two) times daily.   Acetaminophen-Codeine 300-30 MG tablet TAKE 1 TABLET BY MOUTH 3 (THREE) TIMES DAILY AS NEEDED FOR MODERATE PAIN (HEADACHE).   amoxicillin (AMOXIL) 500 MG tablet Take 500 mg by mouth as directed. Takes 4 tablets 1 hour prior to dental procedures   amphetamine-dextroamphetamine (ADDERALL XR) 20 MG 24 hr capsule Take 20 mg by mouth daily. Takes 2 tablets (40 mg) every morning   amphetamine-dextroamphetamine (ADDERALL) 30 MG tablet Take by mouth.   buPROPion (WELLBUTRIN XL) 150 MG 24 hr tablet Take 1 tablet by mouth every morning.   Cholecalciferol 50 MCG (2000 UT) CAPS 1 capsule every day by oral route.   diphenhydrAMINE (BENADRYL) 25 mg capsule Take by mouth.   famotidine (PEPCID) 20 MG tablet Take by mouth.   fenofibrate (TRICOR) 145 MG tablet Take 1 tablet by mouth daily.   furosemide (LASIX) 20 MG tablet Take 1 tablet (20 mg total) by mouth daily. With extra prn weight gain   Glucosamine-Vitamin D 1000-200 MG-UNIT TABS Take 2 tablets by mouth daily.   influenza vac split quadrivalent PF (FLUARIX) 0.5 ML injection TO BE ADMINISTERED BY PHARMACIST FOR IMMUNIZATION   liothyronine (CYTOMEL) 5 MCG tablet Take 2 tablets by mouth daily.   meclizine (ANTIVERT) 25 MG tablet Take by mouth.   metoprolol succinate (TOPROL-XL) 25 MG 24 hr tablet Take 25 mg by mouth daily. Takes 2 tablets for excessive palpitations   Multiple Vitamin (MULTIVITAMIN) tablet Take 1 tablet by mouth daily.   rosuvastatin (CRESTOR) 10 MG tablet Take 10 mg by mouth at bedtime.   sertraline (ZOLOFT) 25 MG tablet Take 25 mg by mouth daily.   sildenafil (VIAGRA) 100 MG tablet Take 1 tablet (100 mg total) by  mouth daily as needed for erectile dysfunction.   warfarin (COUMADIN) 5 MG tablet Take 5 mg by mouth as directed.    warfarin (COUMADIN) 7.5 MG tablet SMARTSIG:0.5-1 Tablet(s) By Mouth As Directed   No facility-administered encounter medications on file as of 04/21/2022.   Dylan Fletcher was contacted to remind of upcoming telephone visit with Dylan Fletcher on 04/26/22 at 3:00. Patient was reminded to have any blood glucose and blood pressure readings available for review at appointment.   Patient confirmed appointment. Patients wife Dylan Fletcher confirmed  Are you having any problems with your medications? No   Do you have any concerns you like to discuss with the pharmacist? No  CCM referral has been placed prior to visit?  No   Star Rating Drugs: Medication:  Last Fill: Day Supply Rosuvastatin '10mg'$  02/17/22  Dylan Fletcher, CPP notified  Dylan Fletcher, [place]  [phone]

## 2022-04-26 ENCOUNTER — Ambulatory Visit: Payer: Medicare Other | Admitting: Pharmacist

## 2022-04-26 DIAGNOSIS — I6930 Unspecified sequelae of cerebral infarction: Secondary | ICD-10-CM

## 2022-04-26 DIAGNOSIS — E785 Hyperlipidemia, unspecified: Secondary | ICD-10-CM

## 2022-04-26 DIAGNOSIS — I10 Essential (primary) hypertension: Secondary | ICD-10-CM

## 2022-04-26 DIAGNOSIS — H8102 Meniere's disease, left ear: Secondary | ICD-10-CM

## 2022-04-26 DIAGNOSIS — I5032 Chronic diastolic (congestive) heart failure: Secondary | ICD-10-CM

## 2022-04-26 DIAGNOSIS — I480 Paroxysmal atrial fibrillation: Secondary | ICD-10-CM

## 2022-04-26 NOTE — Progress Notes (Unsigned)
Chronic Care Management Pharmacy Note  04/26/2022 Name:  Tan Clopper MRN:  [id number] DOB:  11-12-1960  Summary: -Pt endorses compliance with medications, he uses pill box as a reminder to take meds -Pt has good grasp of warfarin food/drug interactions and has been successful with home INR for some time  Recommendations/Changes made from today's visit: -No med changes  Plan: -Sisters will call patient 6 months for general adherence -Pharmacist follow up PRN -PCP 57-monthf/u 08/02/22    Subjective: DCoren Saganis an 61y.o. year old male who is a primary patient of GRia Bush MD.  The CCM team was consulted for assistance with disease management and care coordination needs.    Engaged with patient by telephone for follow up visit in response to provider referral for pharmacy case management and/or care coordination services.   Consent to Services:  The patient was given information about Chronic Care Management services, agreed to services, and gave verbal consent prior to initiation of services.  Please see initial visit note for detailed documentation.   Patient Care Team: GRia Bush MD as PCP - General (Family Medicine) SCurly Shores MD (Cardiology) WEzekiel Ina MD (Inactive) (Cardiology) FCharlton Haws R[facility] Centeras Pharmacist (Pharmacist)  Patient lives at home with his wife. Uses pill box to sort medications.  Recent office visits: 01/30/22 Dr GDanise MinaOV: annual - wt gain noted. Ordered kidney [place] [facility] visit 2-3 months for kidney function. Home vestibular rehab for vertigo.   07/22/21-PCP-Javier Gutierrez,MD-Patient presented for 6 month follow up.Labs ordered,  Recent consult visits: 03/28/22 Dr LBobby Rumpf[facility] Afib on zio monitor; try toprol 1/2 tab BID for dizziness;   03/22/22 [facility] P[facility]arlet Salina[facility] w/ LBBB and fib. Place zio monitor for Afib burden.  02/16/22 Dr OHonor Junes(Endocrine): testosterone is  wnl off clomid x 2 years. TFT normal.   Hospital visits: None in previous 6 months   Objective:  Lab Results  Component Value Date   CREATININE 1.36 04/03/2022   BUN 19 04/03/2022   GFR 55.53 (L) 04/03/2022   NA 141 04/03/2022   K 4.4 04/03/2022   CALCIUM 9.3 04/03/2022   CO2 28 04/03/2022   GLUCOSE 105 (H) 04/03/2022    Lab Results  Component Value Date/Time   HGBA1C 5.4 06/09/2016 08:23 AM   GFR 55.53 (L) 04/03/2022 08:06 AM   GFR 46.80 (L) 01/23/2022 08:15 AM   MICROALBUR <0.7 01/23/2022 08:15 AM   MICROALBUR <0.7 01/12/2021 08:14 AM    Last diabetic Eye exam: No results found for: "HMDIABEYEEXA"  Last diabetic Foot exam: No results found for: "HMDIABFOOTEX"   Lab Results  Component Value Date   CHOL 137 01/23/2022   HDL 40.40 01/23/2022   LDLCALC 78 01/23/2022   LDLDIRECT 96.0 01/14/2020   TRIG 93.0 01/23/2022   CHOLHDL 3 01/23/2022       Latest Ref Rng & Units 04/03/2022    8:06 AM 01/23/2022    8:15 AM 07/22/2021   11:07 AM  Hepatic Function  Total Protein 6.1 - 8.1 g/dL 6.6  6.6    Albumin 3.5 - 5.2 g/dL 4.4  4.3  4.6   AST 0 - 37 U/L  27    ALT 0 - 53 U/L  21    Alk Phosphatase 39 - 117 U/L  26    Total Bilirubin 0.2 - 1.2 mg/dL  0.5      Lab Results  Component Value Date/Time   TSH 0.74  07/22/2021 11:07 AM   TSH 0.94 06/21/2018 08:44 AM   FREET4 0.86 06/02/2015 11:07 AM   FREET4 0.89 12/02/2014 08:52 AM       Latest Ref Rng & Units 04/03/2022    8:06 AM 01/23/2022    8:15 AM 07/22/2021   11:07 AM  CBC  WBC 4.0 - 10.5 K/uL 9.6  8.4  9.7   Hemoglobin 13.0 - 17.0 g/dL 14.2  14.2  14.1   Hematocrit 39.0 - 52.0 % 42.9  42.5  42.5   Platelets 150.0 - 400.0 K/uL 272.0  264.0  272.0     Lab Results  Component Value Date/Time   VD25OH 53.20 01/23/2022 08:15 AM   VD25OH 54.43 01/12/2021 08:14 AM    Clinical ASCVD: Yes  The ASCVD Risk score (Arnett DK, et al., 2019) failed to calculate for the following reasons:   The patient has a prior MI or  stroke diagnosis       01/26/2022    9:20 AM 01/19/2021   11:45 AM 11/10/2019    3:46 PM  Depression screen PHQ 2/9  Decreased Interest 0 0 0  Down, Depressed, Hopeless 1 0 0  PHQ - 2 Score 1 0 0  Altered sleeping 0 0 0  Tired, decreased energy 0 3 0  Change in appetite 0 0 0  Feeling bad or failure about yourself  0 1 0  Trouble concentrating 0 3 0  Moving slowly or fidgety/restless 0 3 0  Suicidal thoughts 0 0 0  PHQ-9 Score 1 10 0  Difficult doing work/chores Not difficult at all  Not difficult at all       01/19/2021   11:45 AM  GAD 7 : Generalized Anxiety Score  Nervous, Anxious, on Edge 0  Control/stop worrying 1  Worry too much - different things 0  Trouble relaxing 0  Restless 0  Easily annoyed or irritable 2  Afraid - awful might happen 0  Total GAD 7 Score 3     CHA2DS2/VAS Stroke Risk Points  Current as of 9 minutes ago     3 >= 2 Points: High Risk  1 - 1.99 Points: Medium Risk  0 Points: Low Risk    Last Change:         Points Metrics  0 Has Congestive Heart Failure:  No    Current as of 9 minutes ago  0 Has Vascular Disease:  No    Current as of 9 minutes ago  1 Has Hypertension:  Yes    Current as of 9 minutes ago  0 Age:  54    Current as of 9 minutes ago  0 Has Diabetes:  No    Current as of 9 minutes ago  2 Had Stroke:  Yes   Had TIA:  No  Had Thromboembolism:  No    Current as of 9 minutes ago  0 Male:  No    Current as of 9 minutes ago     Social History   Tobacco Use  Smoking Status Never  Smokeless Tobacco Never   BP Readings from Last 3 Encounters:  01/30/22 134/70  07/22/21 122/72  01/19/21 130/80   Pulse Readings from Last 3 Encounters:  01/30/22 85  07/22/21 71  01/19/21 76   Wt Readings from Last 3 Encounters:  01/30/22 216 lb 4 oz (98.1 kg)  01/26/22 211 lb (95.7 kg)  07/22/21 211 lb 1 oz (95.7 kg)   BMI Readings from Last 3 Encounters:  01/30/22 35.44 kg/m  01/26/22 34.58 kg/m  07/22/21 34.59 kg/m     Assessment/Interventions: Review of patient past medical history, allergies, medications, health status, including review of consultants reports, laboratory and other test data, was performed as part of comprehensive evaluation and provision of chronic care management services.   SDOH:  (Social Determinants of Health) assessments and interventions performed: Yes SDOH Interventions    Flowsheet Row Clinical Support from 01/26/2022 in [place] at [place] Management from 07/28/2021 in [place] at [place] from 11/10/2019 in [place] at [place] Interventions Intervention Not Indicated Intervention Not Indicated --  Housing Interventions Intervention Not Indicated -- --  Transportation Interventions Intervention Not Indicated -- --  Depression Interventions/Treatment  Medication -- PHQ2-9 Score <4 Follow-up Not Indicated  Financial Strain Interventions Intervention Not Indicated Intervention Not Indicated --  Physical Activity Interventions Intervention Not Indicated -- --  Stress Interventions Intervention Not Indicated -- --  Social Connections Interventions Intervention Not Indicated -- --      [place]: No Food Insecurity (01/26/2022)  Housing: Low Risk  (01/26/2022)  Transportation Needs: No Transportation Needs (01/26/2022)  Alcohol Screen: Low Risk  (01/26/2022)  Depression (PHQ2-9): Low Risk  (01/26/2022)  Financial Resource Strain: Low Risk  (01/26/2022)  Physical Activity: Sufficiently Active (01/26/2022)  Social Connections: Socially Integrated (01/26/2022)  Stress: No Stress Concern Present (01/26/2022)  Tobacco Use: Low Risk  (01/30/2022)    [place]  Allergies  Allergen Reactions   Nicardipine Hcl Shortness Of Breath    Shortness of breath   Nitroglycerin Anaphylaxis   Calcium Channel Blockers Other (See Comments)    Shortness of  breath   Molds & Smuts Other (See Comments)    Ragweed, pollen, mold, etc.   Shellfish Allergy Nausea And Vomiting   Tape Rash    Medications Reviewed Today     Reviewed by Charlton Haws, [facility] (Pharmacist) on 04/26/22 at 1534  Med List Status: <None>   Medication Order Taking? Sig Documenting Provider Last Dose Status Informant  acetaminophen (TYLENOL) 500 MG tablet [id number] Yes Take 1 tablet (500 mg total) by mouth 2 (two) times daily. Ria Bush, MD Taking Active   Acetaminophen-Codeine 300-30 MG tablet [id number] Yes TAKE 1 TABLET BY MOUTH 3 (THREE) TIMES DAILY AS NEEDED FOR MODERATE PAIN (HEADACHE). Ria Bush, MD Taking Active   amoxicillin (AMOXIL) 500 MG tablet [id number] Yes Take 500 mg by mouth as directed. Takes 4 tablets 1 hour prior to dental procedures [provider] Taking Active   amphetamine-dextroamphetamine (ADDERALL XR) 20 MG 24 hr capsule [id number] Yes Take 20 mg by mouth daily. Takes 2 tablets (40 mg) every morning [provider] Taking Active Self  amphetamine-dextroamphetamine (ADDERALL) 30 MG tablet [id number] Yes Take by mouth. [provider] Taking Active   buPROPion (WELLBUTRIN XL) 150 MG 24 hr tablet [id number] Yes Take 1 tablet by mouth every morning. [provider] Taking Active   Cholecalciferol 50 MCG (2000 UT) CAPS [id number] Yes 1 capsule every day by oral route. [provider] Taking Active   diphenhydrAMINE (BENADRYL) 25 mg capsule [id number] Yes Take by mouth. [provider] Taking Active   famotidine (PEPCID) 20 MG tablet [id number] Yes Take by mouth. [provider] Taking Active   fenofibrate (TRICOR) 145 MG tablet [id number] Yes Take 1 tablet by mouth daily. [provider] Taking Active   furosemide (LASIX)  20 MG tablet [id number] Yes Take 1 tablet (20 mg total) by mouth daily. With extra prn weight gain Ria Bush, MD Taking Active   Glucosamine-Vitamin D  1000-200 MG-UNIT TABS [id number] Yes Take 2 tablets by mouth daily. Ria Bush, MD Taking Active   influenza vac split quadrivalent PF (FLUARIX) 0.5 ML injection [id number] Yes TO BE ADMINISTERED BY PHARMACIST FOR IMMUNIZATION [provider] Taking Active   liothyronine (CYTOMEL) 5 MCG tablet [id number] Yes Take 2 tablets by mouth daily. [provider] Taking Active   meclizine (ANTIVERT) 25 MG tablet [id number] Yes Take by mouth. [provider] Taking Active   metoprolol succinate (TOPROL-XL) 25 MG 24 hr tablet [id number] Yes Take 25 mg by mouth daily. Takes 2 tablets for excessive palpitations Ria Bush, MD Taking Active   Multiple Vitamin (MULTIVITAMIN) tablet [id number] Yes Take 1 tablet by mouth daily. [provider] Taking Active   rosuvastatin (CRESTOR) 10 MG tablet [id number] Yes Take 10 mg by mouth at bedtime. [provider] Taking Active   sertraline (ZOLOFT) 25 MG tablet [id number] Yes Take 25 mg by mouth daily. [provider] Taking Active   sildenafil (VIAGRA) 100 MG tablet [id number] Yes Take 1 tablet (100 mg total) by mouth daily as needed for erectile dysfunction. Ria Bush, MD Taking Active   warfarin (COUMADIN) 5 MG tablet [id number] Yes Take 5 mg by mouth as directed.  [provider] Taking Active   warfarin (COUMADIN) 7.5 MG tablet [id number] Yes SMARTSIG:0.5-1 Tablet(s) By Mouth As Directed [provider] Taking Active             Patient Active Problem List   Diagnosis Date Noted   Fatty liver 02/10/2022   Renal insufficiency 01/31/2022   BPPV (benign paroxysmal positional vertigo) 01/25/2022   Skin nodule 07/22/2021   PVC (premature ventricular contraction) 03/22/2021   MDD (major depressive disorder), recurrent episode, moderate ([place]) 05/14/2019   Paresthesia 03/07/2019   Vertigo 03/07/2019   Hypertriglyceridemia 10/25/2018   Medicare annual wellness visit, subsequent  06/24/2018   Advanced directives, counseling/discussion 06/24/2018   Chronic pain of left thumb 03/19/2018   Palpitations 11/16/2017   Aneurysm of basilar artery ([place]) 05/17/2017   Cognitive deficit as late effect of cerebrovascular accident (CVA) 12/23/2016   Displaced fracture of trapezium (larger multangular), left wrist, initial encounter for open fracture 09/06/2016   Hypogonadism in male 07/30/2016   Patient in clinical research study 08/05/2015   Meniere disease, left 06/02/2015   PVD (posterior vitreous detachment), left eye 03/19/2015   Heart failure with preserved ejection fraction ([place]) 02/03/2015   Globus sensation 12/02/2014   Transient alteration of awareness 05/19/2014   Subtherapeutic anticoagulation 04/30/2014   History of adenomatous polyp of colon 03/10/2014   Chronic fatigue 12/18/2013   Pleural effusion 10/04/2013   Cerebral infarction ([place]) 09/20/2013   Chronic anticoagulation 09/20/2013   Status post Maze operation for atrial fibrillation 09/20/2013   First degree AV block 09/18/2013   Aphasia as late effect of stroke 09/12/2013   History of cerebrovascular accident (CVA) with residual deficit 09/12/2013   Warfarin anticoagulation    History of mitral valve replacement with mechanical valve 09/09/2013   S/P ventricular septal myectomy 09/09/2013   Junctional ectopic tachycardia 09/05/2013   Atelectasis 09/02/2013   Post-op pain 09/02/2013   Intestinal infection due to Clostridium difficile 08/26/2013   H/O mitral valve replacement 08/17/2013   LBBB (left bundle branch block) 08/17/2013   Presence of prosthetic heart valve 08/17/2013   History of  atrial fibrillation    ADHD (attention deficit hyperactivity disorder) 05/19/2013   Paroxysmal atrial fibrillation ([place]) 05/19/2013   HOCM (hypertrophic obstructive cardiomyopathy) ([place]) 02/13/2013   Chest pain 02/13/2013   ED (erectile dysfunction)    Severe obesity (BMI 35.0-39.9) with comorbidity ([place])     Vitamin D deficiency    Hypertrophic obstructive cardiomyopathy (HCC)    OSA (obstructive sleep apnea)    Migraine with aura    Dyslipidemia    HTN (hypertension)    Allergic rhinitis    Anxiety associated with depression     Immunization History  Administered Date(s) Administered   Influenza,inj,Quad PF,6+ Mos 03/19/2015, 03/17/2016, 03/19/2018, 04/04/2019, 04/28/2020, 03/30/2021   Influenza,inj,quad, With Preservative 03/19/2016   Influenza-Unspecified 03/19/2014, 03/16/2017   Moderna Covid-19 Vaccine Bivalent Booster 30yr & up 03/17/2021   Moderna Sars-Covid-2 Vaccination 07/15/2019, 09/22/2019   PNEUMOCOCCAL CONJUGATE-20 09/08/2021   Pneumococcal Polysaccharide-23 03/19/2015   Tdap 02/06/2013, 11/29/2019   Zoster Recombinat (Shingrix) 09/01/2021, 01/20/2022    Conditions to be addressed/monitored:  Hypertension, Hyperlipidemia, Atrial Fibrillation, GERD, Depression, and Anxiety, Cognitive impairment  Care Plan : [place] Updates made by FCharlton Haws [place] 05/03/2022 12:00 AM     Problem: Hypertension, Hyperlipidemia, Atrial Fibrillation, GERD, Depression, and Anxiety, Cognitive impairment   Priority: High     Long-Range Goal: Disease mgmt   Start Date: 07/28/2021  Expected End Date: 07/28/2022  This Visit's Progress: On track  Recent Progress: On track  Priority: High  Note:   Current Barriers:  None identified  Pharmacist Clinical Goal(s):  Patient will contact provider office for questions/concerns as evidenced notation of same in electronic health record through collaboration with PharmD and provider.   Interventions: 1:1 collaboration with GRia Bush MD regarding development and update of comprehensive plan of care as evidenced by provider attestation and co-signature Inter-disciplinary care team collaboration (see longitudinal plan of care) Comprehensive medication review performed; medication list updated in electronic  medical record  Hypertension (BP goal <130/80) -Controlled - BP is at goal in clinic; pt endorses compliance with medications;  he is taking furosemide for Meniere's disease, almost never has to take an extra dose for weight gain/swelling -Hx Meniere's disease; pt limits to 1000-1200 mg sodium per day -ECHO 03/22/22: EF > 55%; grade II diastolic dysfunction -Current treatment: Metoprolol succinate 25 daily - Appropriate, Effective, Safe, Accessible Furosemide 20 mg daily AM - Appropriate, Effective, Safe, Accessible -Denies hypotensive/hypertensive symptoms -Educated on BP goals and benefits of medications for prevention of heart attack, stroke and kidney damage; -Counseled to monitor BP at home periodically -Recommended to continue current medication  Hyperlipidemia: (LDL goal < 70) -Controlled - LDL is at goal; Trig previously >800, now 120 -Hx CVA 2015 w/ chronic cognitive impairment -Hx hypertrophic obstructive cardiomyopathy -Current treatment: Rosuvastatin 10 mg daily HS -Appropriate, Effective, Safe, Accessible Fenofibrate 145 mg daily -Appropriate, Effective, Safe, Accessible -Educated on Cholesterol goals; Benefits of statin for ASCVD risk reduction; -Recommended to continue current medication  Atrial Fibrillation (Goal: prevent stroke and major bleeding) -Controlled -CHADSVASC: 2; hx MVR (St Jude 2015, INR goal 2.5-3.5); he has Lovenox at home if needed for bridging  -Pt does home INR every 2 weeks, follows with CPP at cardiology -Current treatment: Metoprolol succinate 25 mg daily -Appropriate, Effective, Safe, Accessible Warfarin 5 mg AD -Appropriate, Effective, Safe, Accessible Warfarin 7.5 mg AD - Appropriate, Effective, Safe, Accessible -Counseled on avoidance of NSAIDs due to increased bleeding risk with anticoagulants; importance of regular laboratory monitoring; -Reviewed warfarin interactions,  difficulty with maintaining INR at goal; importance of consistent diet  (vitamin K foods) -Recommended to continue current medication  Depression/Anxiety (Goal: manage symptoms) -Controlled - pt reports he was started on liothyronine off label to help with alertness, mood; recent TSH was WNL -PHQ9: 1 (01/2022) - minimal depression -GAD7: 3 (01/2021) - minimal anxiety -Connected with Psychiatry for mental health support; recently started light therapy at home and pt reports it is helping -Current treatment: Sertraline 25 mg daily -Appropriate, Effective, Safe, Accessible Bupropion XL 150 mg daily -Appropriate, Effective, Safe, Accessible Liothyronine 5 mcg - 2 tablets daily -Appropriate, Query Effective, Safe, Accessible -Medications previously tried/failed: alprazolam, escitalopram -Educated on Benefits of medication for symptom control -Discussed his antidepressants are low doses, if he is not getting benefit he can contact prescriber for dose increase -Recommended to continue current medication  Cognitive deficity d/t stroke (Goal: manage symptoms) -Controlled - pt is aware of risks with stimulants; he reports his cardiologist and [place] [facility] cardiologists are aware he is on them -Takes stimulants for "wakefulness" -Current treatment  Adderall XR 20 mg - 2 tab AM - Appropriate, Effective, Safe, Accessible Adderall 30 mg daily PM - Appropriate, Effective, Safe, Accessible -Recommended to continue current medication  Thumb pain (Goal: manage pain) -Controlled - pt is aware he needs to avoid NSAIDs -wears compression gloves at night which helps -Current treatment  Tylenol 500 mg BID -Appropriate, Effective, Safe, Accessible Tylenol No. 3 PRN - for headaches-Appropriate, Effective, Safe, Accessible Glucosamine-Vitamin D -Appropriate, Effective, Safe, Accessible -Recommended to continue current medication  GERD (Goal: manage symptoms) -Controlled -Current treatment  Famotidine 20 mg daily HS -Appropriate, Effective, Safe, Accessible -Recommended to  continue current medication  Health Maintenance -Vaccine gaps: Shingrix, Covid booster, Flu -Hx Meniere's disease -Current therapy:  Cetirizine 10 mg daily PRN Multivitamin w/o vitamin K Sildenafil 100 mg PRN ED -Patient is satisfied with current therapy and denies issues -Recommended to continue current medication  Patient Goals/Self-Care Activities Patient will:  - take medications as prescribed as evidenced by patient report and record review focus on medication adherence by pill box check blood pressure periodically, document, and provide at future appointments        Medication Assistance: None required.  Patient affirms current coverage meets needs.  Compliance/Adherence/Medication fill history: Care Gaps: HIV screening Colonoscopy (due 05/10/14)  Star-Rating Drugs: Rosuvastatin - LF 06/06/21 x 90 ds; PDC 100%  Medication Access: Within the past 30 days, how often has patient missed a dose of medication? 0 Is a pillbox or other method used to improve adherence? Yes  Factors that may affect medication adherence? no barriers identified Are meds synced by current pharmacy? No  Are meds delivered by current pharmacy? No  Does patient experience delays in picking up medications due to transportation concerns? No   [facility]stream Services Reviewed: Is patient disadvantaged to use [facility]?: Yes  Current Rx insurance plan: [place] Name and location of Current pharmacy:  [facility], [place] [address] [place] [address] Phone: [phone] Fax: [phone]  [facility] services reviewed with patient today?: No  Patient requests to transfer care to [facility] Pharmacy?: No  Reason patient declined to change pharmacies: Disadvantaged due to insurance/mail order   Care Plan and Follow Up Patient Decision:  Patient agrees to Care Plan and Follow-up.  Plan: The patient has been provided with contact  information for the care management team and has been advised to call with any health related questions  or concerns.   Charlene Brooke, PharmD, BCACP Clinical Pharmacist [facility] [phone]

## 2022-04-27 DIAGNOSIS — I4891 Unspecified atrial fibrillation: Secondary | ICD-10-CM | POA: Diagnosis not present

## 2022-05-02 DIAGNOSIS — I4891 Unspecified atrial fibrillation: Secondary | ICD-10-CM | POA: Diagnosis not present

## 2022-05-02 DIAGNOSIS — Z952 Presence of prosthetic heart valve: Secondary | ICD-10-CM | POA: Diagnosis not present

## 2022-05-03 DIAGNOSIS — G43909 Migraine, unspecified, not intractable, without status migrainosus: Secondary | ICD-10-CM | POA: Diagnosis present

## 2022-05-03 DIAGNOSIS — I4892 Unspecified atrial flutter: Secondary | ICD-10-CM | POA: Diagnosis not present

## 2022-05-03 DIAGNOSIS — D6869 Other thrombophilia: Secondary | ICD-10-CM | POA: Diagnosis not present

## 2022-05-03 DIAGNOSIS — I340 Nonrheumatic mitral (valve) insufficiency: Secondary | ICD-10-CM | POA: Diagnosis not present

## 2022-05-03 DIAGNOSIS — Z888 Allergy status to other drugs, medicaments and biological substances status: Secondary | ICD-10-CM | POA: Diagnosis not present

## 2022-05-03 DIAGNOSIS — R000 Tachycardia, unspecified: Secondary | ICD-10-CM | POA: Diagnosis not present

## 2022-05-03 DIAGNOSIS — I5022 Chronic systolic (congestive) heart failure: Secondary | ICD-10-CM | POA: Diagnosis not present

## 2022-05-03 DIAGNOSIS — Z8673 Personal history of transient ischemic attack (TIA), and cerebral infarction without residual deficits: Secondary | ICD-10-CM | POA: Diagnosis not present

## 2022-05-03 DIAGNOSIS — C9110 Chronic lymphocytic leukemia of B-cell type not having achieved remission: Secondary | ICD-10-CM | POA: Diagnosis present

## 2022-05-03 DIAGNOSIS — I484 Atypical atrial flutter: Secondary | ICD-10-CM | POA: Diagnosis present

## 2022-05-03 DIAGNOSIS — Z886 Allergy status to analgesic agent status: Secondary | ICD-10-CM | POA: Diagnosis not present

## 2022-05-03 DIAGNOSIS — R002 Palpitations: Secondary | ICD-10-CM | POA: Diagnosis not present

## 2022-05-03 DIAGNOSIS — R400 Somnolence: Secondary | ICD-10-CM | POA: Diagnosis not present

## 2022-05-03 DIAGNOSIS — D72820 Lymphocytosis (symptomatic): Secondary | ICD-10-CM | POA: Diagnosis not present

## 2022-05-03 DIAGNOSIS — J45909 Unspecified asthma, uncomplicated: Secondary | ICD-10-CM | POA: Diagnosis present

## 2022-05-03 DIAGNOSIS — F909 Attention-deficit hyperactivity disorder, unspecified type: Secondary | ICD-10-CM | POA: Diagnosis present

## 2022-05-03 DIAGNOSIS — Z5181 Encounter for therapeutic drug level monitoring: Secondary | ICD-10-CM | POA: Diagnosis not present

## 2022-05-03 DIAGNOSIS — I421 Obstructive hypertrophic cardiomyopathy: Secondary | ICD-10-CM | POA: Diagnosis not present

## 2022-05-03 DIAGNOSIS — E785 Hyperlipidemia, unspecified: Secondary | ICD-10-CM | POA: Diagnosis present

## 2022-05-03 DIAGNOSIS — E7220 Disorder of urea cycle metabolism, unspecified: Secondary | ICD-10-CM | POA: Diagnosis present

## 2022-05-03 DIAGNOSIS — Z79899 Other long term (current) drug therapy: Secondary | ICD-10-CM | POA: Diagnosis not present

## 2022-05-03 DIAGNOSIS — Z95818 Presence of other cardiac implants and grafts: Secondary | ICD-10-CM | POA: Diagnosis not present

## 2022-05-03 DIAGNOSIS — I10 Essential (primary) hypertension: Secondary | ICD-10-CM | POA: Diagnosis present

## 2022-05-03 DIAGNOSIS — K219 Gastro-esophageal reflux disease without esophagitis: Secondary | ICD-10-CM | POA: Diagnosis present

## 2022-05-03 DIAGNOSIS — I447 Left bundle-branch block, unspecified: Secondary | ICD-10-CM | POA: Diagnosis present

## 2022-05-03 DIAGNOSIS — H8109 Meniere's disease, unspecified ear: Secondary | ICD-10-CM | POA: Diagnosis present

## 2022-05-03 DIAGNOSIS — I5032 Chronic diastolic (congestive) heart failure: Secondary | ICD-10-CM | POA: Diagnosis present

## 2022-05-03 DIAGNOSIS — I480 Paroxysmal atrial fibrillation: Secondary | ICD-10-CM | POA: Diagnosis present

## 2022-05-03 DIAGNOSIS — E039 Hypothyroidism, unspecified: Secondary | ICD-10-CM | POA: Diagnosis present

## 2022-05-03 DIAGNOSIS — Z952 Presence of prosthetic heart valve: Secondary | ICD-10-CM | POA: Diagnosis not present

## 2022-05-03 DIAGNOSIS — I5189 Other ill-defined heart diseases: Secondary | ICD-10-CM | POA: Diagnosis not present

## 2022-05-03 DIAGNOSIS — Z7901 Long term (current) use of anticoagulants: Secondary | ICD-10-CM | POA: Diagnosis not present

## 2022-05-03 DIAGNOSIS — E669 Obesity, unspecified: Secondary | ICD-10-CM | POA: Diagnosis present

## 2022-05-03 DIAGNOSIS — I422 Other hypertrophic cardiomyopathy: Secondary | ICD-10-CM | POA: Diagnosis present

## 2022-05-03 DIAGNOSIS — Z9989 Dependence on other enabling machines and devices: Secondary | ICD-10-CM | POA: Diagnosis not present

## 2022-05-03 DIAGNOSIS — F32A Depression, unspecified: Secondary | ICD-10-CM | POA: Diagnosis present

## 2022-05-03 DIAGNOSIS — I472 Ventricular tachycardia, unspecified: Secondary | ICD-10-CM | POA: Diagnosis not present

## 2022-05-03 DIAGNOSIS — R5383 Other fatigue: Secondary | ICD-10-CM | POA: Diagnosis not present

## 2022-05-03 DIAGNOSIS — Z96659 Presence of unspecified artificial knee joint: Secondary | ICD-10-CM | POA: Diagnosis present

## 2022-05-03 DIAGNOSIS — G4733 Obstructive sleep apnea (adult) (pediatric): Secondary | ICD-10-CM | POA: Diagnosis not present

## 2022-05-03 DIAGNOSIS — I4891 Unspecified atrial fibrillation: Secondary | ICD-10-CM | POA: Diagnosis not present

## 2022-05-03 DIAGNOSIS — Z8679 Personal history of other diseases of the circulatory system: Secondary | ICD-10-CM | POA: Diagnosis not present

## 2022-05-03 DIAGNOSIS — D6859 Other primary thrombophilia: Secondary | ICD-10-CM | POA: Diagnosis present

## 2022-05-03 DIAGNOSIS — D72829 Elevated white blood cell count, unspecified: Secondary | ICD-10-CM | POA: Diagnosis not present

## 2022-05-03 NOTE — Patient Instructions (Signed)
Visit Information  Phone number for Pharmacist: [phone]   Goals Addressed   None     Care Plan : [place]  Updates made by Charlton Haws, RPH since 05/03/2022 12:00 AM     Problem: Hypertension, Hyperlipidemia, Atrial Fibrillation, GERD, Depression, and Anxiety, Cognitive impairment   Priority: High     Long-Range Goal: Disease mgmt   Start Date: 07/28/2021  Expected End Date: 07/28/2022  This Visit's Progress: On track  Recent Progress: On track  Priority: High  Note:   Current Barriers:  None identified  Pharmacist Clinical Goal(s):  Patient will contact provider office for questions/concerns as evidenced notation of same in electronic health record through collaboration with PharmD and provider.   Interventions: 1:1 collaboration with Ria Bush, MD regarding development and update of comprehensive plan of care as evidenced by provider attestation and co-signature Inter-disciplinary care team collaboration (see longitudinal plan of care) Comprehensive medication review performed; medication list updated in electronic medical record  Hypertension (BP goal <130/80) -Controlled - BP is at goal in clinic; pt endorses compliance with medications;  he is taking furosemide for Meniere's disease, almost never has to take an extra dose for weight gain/swelling -Hx Meniere's disease; pt limits to 1000-1200 mg sodium per day -ECHO 03/22/22: EF > 55%; grade II diastolic dysfunction -Current treatment: Metoprolol succinate 25 daily - Appropriate, Effective, Safe, Accessible Furosemide 20 mg daily AM - Appropriate, Effective, Safe, Accessible -Denies hypotensive/hypertensive symptoms -Educated on BP goals and benefits of medications for prevention of heart attack, stroke and kidney damage; -Counseled to monitor BP at home periodically -Recommended to continue current medication  Hyperlipidemia: (LDL goal < 70) -Controlled - LDL is at goal; Trig  previously >800, now 120 -Hx CVA 2015 w/ chronic cognitive impairment -Hx hypertrophic obstructive cardiomyopathy -Current treatment: Rosuvastatin 10 mg daily HS -Appropriate, Effective, Safe, Accessible Fenofibrate 145 mg daily -Appropriate, Effective, Safe, Accessible -Educated on Cholesterol goals; Benefits of statin for ASCVD risk reduction; -Recommended to continue current medication  Atrial Fibrillation (Goal: prevent stroke and major bleeding) -Controlled -CHADSVASC: 2; hx MVR (St Jude 2015, INR goal 2.5-3.5); he has Lovenox at home if needed for bridging  -Pt does home INR every 2 weeks, follows with CPP at cardiology -Current treatment: Metoprolol succinate 25 mg daily -Appropriate, Effective, Safe, Accessible Warfarin 5 mg AD -Appropriate, Effective, Safe, Accessible Warfarin 7.5 mg AD - Appropriate, Effective, Safe, Accessible -Counseled on avoidance of NSAIDs due to increased bleeding risk with anticoagulants; importance of regular laboratory monitoring; -Reviewed warfarin interactions, difficulty with maintaining INR at goal; importance of consistent diet (vitamin K foods) -Recommended to continue current medication  Depression/Anxiety (Goal: manage symptoms) -Controlled - pt reports he was started on liothyronine off label to help with alertness, mood; recent TSH was WNL -PHQ9: 1 (01/2022) - minimal depression -GAD7: 3 (01/2021) - minimal anxiety -Connected with Psychiatry for mental health support; recently started light therapy at home and pt reports it is helping -Current treatment: Sertraline 25 mg daily -Appropriate, Effective, Safe, Accessible Bupropion XL 150 mg daily -Appropriate, Effective, Safe, Accessible Liothyronine 5 mcg - 2 tablets daily -Appropriate, Query Effective, Safe, Accessible -Medications previously tried/failed: alprazolam, escitalopram -Educated on Benefits of medication for symptom control -Discussed his antidepressants are low doses, if he is  not getting benefit he can contact prescriber for dose increase -Recommended to continue current medication  Cognitive deficity d/t stroke (Goal: manage symptoms) -Controlled - pt is aware of risks with stimulants; he reports his cardiologist and [facility] cardiologists are aware he is on them -Takes stimulants for "wakefulness" -Current treatment  Adderall XR 20 mg - 2 tab AM - Appropriate, Effective, Safe, Accessible Adderall 30 mg daily PM - Appropriate, Effective, Safe, Accessible -Recommended to continue current medication  Thumb pain (Goal: manage pain) -Controlled - pt is aware he needs to avoid NSAIDs -wears compression gloves at night which helps -Current treatment  Tylenol 500 mg BID -Appropriate, Effective, Safe, Accessible Tylenol No. 3 PRN - for headaches-Appropriate, Effective, Safe, Accessible Glucosamine-Vitamin D -Appropriate, Effective, Safe, Accessible -Recommended to continue current medication  GERD (Goal: manage symptoms) -Controlled -Current treatment  Famotidine 20 mg daily HS -Appropriate, Effective, Safe, Accessible -Recommended to continue current medication  Health Maintenance -Vaccine gaps: Shingrix, Covid booster, Flu -Hx Meniere's disease -Current therapy:  Cetirizine 10 mg daily PRN Multivitamin w/o vitamin K Sildenafil 100 mg PRN ED -Patient is satisfied with current therapy and denies issues -Recommended to continue current medication  Patient Goals/Self-Care Activities Patient will:  - take medications as prescribed as evidenced by patient report and record review focus on medication adherence by pill box check blood pressure periodically, document, and provide at future appointments       Patient verbalizes understanding of instructions and care plan provided today and agrees to view in [place]. Active MyChart status and patient understanding of how to access instructions and care plan via MyChart confirmed with patient.     Telephone follow up appointment with pharmacy team member scheduled for: PRN  Charlene Brooke, PharmD, BCACP Clinical Pharmacist [facility] at [facility] [phone]

## 2022-05-04 DIAGNOSIS — R002 Palpitations: Secondary | ICD-10-CM | POA: Diagnosis not present

## 2022-05-04 DIAGNOSIS — I422 Other hypertrophic cardiomyopathy: Secondary | ICD-10-CM | POA: Diagnosis present

## 2022-05-04 DIAGNOSIS — I10 Essential (primary) hypertension: Secondary | ICD-10-CM | POA: Diagnosis present

## 2022-05-04 DIAGNOSIS — F909 Attention-deficit hyperactivity disorder, unspecified type: Secondary | ICD-10-CM | POA: Diagnosis present

## 2022-05-04 DIAGNOSIS — Z7901 Long term (current) use of anticoagulants: Secondary | ICD-10-CM | POA: Diagnosis not present

## 2022-05-04 DIAGNOSIS — Z8673 Personal history of transient ischemic attack (TIA), and cerebral infarction without residual deficits: Secondary | ICD-10-CM | POA: Diagnosis not present

## 2022-05-04 DIAGNOSIS — I4892 Unspecified atrial flutter: Secondary | ICD-10-CM | POA: Diagnosis not present

## 2022-05-04 DIAGNOSIS — I421 Obstructive hypertrophic cardiomyopathy: Secondary | ICD-10-CM | POA: Diagnosis not present

## 2022-05-04 DIAGNOSIS — Z8679 Personal history of other diseases of the circulatory system: Secondary | ICD-10-CM | POA: Diagnosis not present

## 2022-05-04 DIAGNOSIS — E785 Hyperlipidemia, unspecified: Secondary | ICD-10-CM | POA: Diagnosis present

## 2022-05-04 DIAGNOSIS — D6869 Other thrombophilia: Secondary | ICD-10-CM | POA: Diagnosis not present

## 2022-05-04 DIAGNOSIS — C9110 Chronic lymphocytic leukemia of B-cell type not having achieved remission: Secondary | ICD-10-CM | POA: Diagnosis present

## 2022-05-04 DIAGNOSIS — R000 Tachycardia, unspecified: Secondary | ICD-10-CM | POA: Diagnosis not present

## 2022-05-04 DIAGNOSIS — H8109 Meniere's disease, unspecified ear: Secondary | ICD-10-CM | POA: Diagnosis present

## 2022-05-04 DIAGNOSIS — Z886 Allergy status to analgesic agent status: Secondary | ICD-10-CM | POA: Diagnosis not present

## 2022-05-04 DIAGNOSIS — J45909 Unspecified asthma, uncomplicated: Secondary | ICD-10-CM | POA: Diagnosis present

## 2022-05-04 DIAGNOSIS — E039 Hypothyroidism, unspecified: Secondary | ICD-10-CM | POA: Diagnosis present

## 2022-05-04 DIAGNOSIS — D72829 Elevated white blood cell count, unspecified: Secondary | ICD-10-CM | POA: Diagnosis not present

## 2022-05-04 DIAGNOSIS — E669 Obesity, unspecified: Secondary | ICD-10-CM | POA: Diagnosis present

## 2022-05-04 DIAGNOSIS — D72820 Lymphocytosis (symptomatic): Secondary | ICD-10-CM | POA: Diagnosis not present

## 2022-05-04 DIAGNOSIS — K219 Gastro-esophageal reflux disease without esophagitis: Secondary | ICD-10-CM | POA: Diagnosis present

## 2022-05-04 DIAGNOSIS — I484 Atypical atrial flutter: Secondary | ICD-10-CM | POA: Diagnosis present

## 2022-05-04 DIAGNOSIS — Z9989 Dependence on other enabling machines and devices: Secondary | ICD-10-CM | POA: Diagnosis not present

## 2022-05-04 DIAGNOSIS — G4733 Obstructive sleep apnea (adult) (pediatric): Secondary | ICD-10-CM | POA: Diagnosis present

## 2022-05-04 DIAGNOSIS — Z5181 Encounter for therapeutic drug level monitoring: Secondary | ICD-10-CM | POA: Diagnosis not present

## 2022-05-04 DIAGNOSIS — F32A Depression, unspecified: Secondary | ICD-10-CM | POA: Diagnosis present

## 2022-05-04 DIAGNOSIS — G43909 Migraine, unspecified, not intractable, without status migrainosus: Secondary | ICD-10-CM | POA: Diagnosis present

## 2022-05-04 DIAGNOSIS — I480 Paroxysmal atrial fibrillation: Secondary | ICD-10-CM | POA: Diagnosis present

## 2022-05-04 DIAGNOSIS — I447 Left bundle-branch block, unspecified: Secondary | ICD-10-CM | POA: Diagnosis not present

## 2022-05-04 DIAGNOSIS — Z888 Allergy status to other drugs, medicaments and biological substances status: Secondary | ICD-10-CM | POA: Diagnosis not present

## 2022-05-04 DIAGNOSIS — D6859 Other primary thrombophilia: Secondary | ICD-10-CM | POA: Diagnosis present

## 2022-05-04 DIAGNOSIS — I5032 Chronic diastolic (congestive) heart failure: Secondary | ICD-10-CM | POA: Diagnosis present

## 2022-05-04 DIAGNOSIS — R400 Somnolence: Secondary | ICD-10-CM | POA: Diagnosis not present

## 2022-05-04 DIAGNOSIS — Z79899 Other long term (current) drug therapy: Secondary | ICD-10-CM | POA: Diagnosis not present

## 2022-05-04 DIAGNOSIS — Z952 Presence of prosthetic heart valve: Secondary | ICD-10-CM | POA: Diagnosis not present

## 2022-05-04 DIAGNOSIS — I4891 Unspecified atrial fibrillation: Secondary | ICD-10-CM | POA: Diagnosis not present

## 2022-05-04 DIAGNOSIS — E7220 Disorder of urea cycle metabolism, unspecified: Secondary | ICD-10-CM | POA: Diagnosis present

## 2022-05-04 DIAGNOSIS — Z96659 Presence of unspecified artificial knee joint: Secondary | ICD-10-CM | POA: Diagnosis present

## 2022-05-07 DIAGNOSIS — R002 Palpitations: Secondary | ICD-10-CM | POA: Diagnosis not present

## 2022-05-09 ENCOUNTER — Encounter: Payer: Self-pay | Admitting: *Deleted

## 2022-05-09 ENCOUNTER — Telehealth: Payer: Self-pay | Admitting: *Deleted

## 2022-05-09 NOTE — Patient Outreach (Signed)
  Care Coordination [facility] Note Transition Care Management Follow-up Telephone Call Date of discharge and from where: Saturday, 05/06/22 [facility]; palpitations; paroxysmal A-Fib How have you been since you were released from the hospital? Per spouse/ caregiver [name], no designated DPR verified- no specific clinical information shared today: "He is doing fine, much better; I am taking care of him, I am a healthcare professional and handle all of his medical care, so I am glad you have told me that I am not listed oon his DPR, we will take care of that right away.  I help him with anything he needs, but overall, he is able to do most things to care for himself on his own" Any questions or concerns? No  Items Reviewed: Did the pt receive and understand the discharge instructions provided? Yes  Medications obtained and verified? Yes  Other? No  Any new allergies since your discharge? No  Dietary orders reviewed? No Do you have support at home? Yes  spouse/ caregiver provides assistance with care needs as indicated  Home Care and Equipment/Supplies: Were home health services ordered? no If so, what is the name of the agency? N/A  Has the agency set up a time to come to the patient's home? not applicable Were any new equipment or medical supplies ordered?  No What is the name of the medical supply agency? N/A Were you able to get the supplies/equipment? not applicable Do you have any questions related to the use of the equipment or supplies? No N/A  Functional Questionnaire: (I = Independent and D = Dependent) ADLs: I  spouse/ caregiver provides assistance with care needs as indicated  Bathing/Dressing- I  Meal Prep- I  spouse/ caregiver provides assistance with care needs as indicated  Eating- I  Maintaining continence- I  Transferring/Ambulation- I  Managing Meds- I  spouse/ caregiver provides assistance with care needs as indicated  Follow up appointments reviewed:  PCP  Hospital f/u appt confirmed? No  Scheduled to see - on - @ [facility] f/u appt confirmed? Yes  Scheduled to see Cardiologist/ EP provider on Tuesday, 05/23/22 @ "can't exactly remember, but that's the day and we are going" Are transportation arrangements needed? No  If their condition worsens, is the pt aware to call PCP or go to the Emergency Dept.? Yes Was the patient provided with contact information for the PCP's office or ED? No- souse reports she has contact information for provider offices Was to pt encouraged to call back with questions or concerns? Yes  SDOH assessments and interventions completed:   Yes  Care Coordination Interventions Activated:  Yes   Care Coordination Interventions:  Discussed importance of/ provided education around purpose and limitations of DPR forms and encouraged caregiver to complete DPR's for all established or new care providers    Encounter Outcome:  Pt. Visit Completed    Oneta Rack, RN, BSN, CCRN Alumnus RN CM Care Coordination/ Transition of [place] Management [phone]: direct office

## 2022-05-10 ENCOUNTER — Encounter: Payer: Self-pay | Admitting: Family Medicine

## 2022-05-10 NOTE — Telephone Encounter (Signed)
Spoke to patient's wife Dylan Fletcher) and was advised that her sister has tested positive for covid today and she lives in their basement.Dylan Fletcher stated that her sister started with symptoms yesterday. Patient's wife was advised that she is not on her husband's DPR so I can take information but unable to discuss her husband with her.  [name] told me to call her husband and he should answer the phone because he is at home. Called patient and left a message on voicemail to call the office back. Patient called back and stated that he did did a covid test and it was negative. Patient stated that his sister in law lives in their basement and he has not had much direct contact with her but his wife has. Patient stated that he has had a mild cough for a couple of days but no other symptoms. Patient was advised to keep a close eye on his symptoms and if he develops more symptoms in the next couple of days he should do another covid test. Patient was given UC and ER precautions and he verbalized understanding.  Patient was advise he should take vitamin D, C and Zinc per [name]. Patient was advised that he should limit his contact with his sister-in-law and they should wear a mask.

## 2022-05-16 NOTE — Telephone Encounter (Signed)
Agree with recommendations. Thanks!

## 2022-05-22 ENCOUNTER — Telehealth: Payer: Self-pay

## 2022-05-22 NOTE — Chronic Care Management (AMB) (Cosign Needed)
Chronic Care Management Pharmacy Assistant   Name: Dylan Fletcher  MRN: [id number] DOB: Oct 02, 1962t.  Reason for Encounter: Hospital Follow Up  Medications: Outpatient Encounter Medications as of 05/22/2022  Medication Sig   acetaminophen (TYLENOL) 500 MG tablet Take 1 tablet (500 mg total) by mouth 2 (two) times daily.   Acetaminophen-Codeine 300-30 MG tablet TAKE 1 TABLET BY MOUTH 3 (THREE) TIMES DAILY AS NEEDED FOR MODERATE PAIN (HEADACHE).   amoxicillin (AMOXIL) 500 MG tablet Take 500 mg by mouth as directed. Takes 4 tablets 1 hour prior to dental procedures   amphetamine-dextroamphetamine (ADDERALL XR) 20 MG 24 hr capsule Take 20 mg by mouth daily. Takes 2 tablets (40 mg) every morning   amphetamine-dextroamphetamine (ADDERALL) 30 MG tablet Take by mouth.   buPROPion (WELLBUTRIN XL) 150 MG 24 hr tablet Take 1 tablet by mouth every morning.   Cholecalciferol 50 MCG (2000 UT) CAPS 1 capsule every day by oral route.   diphenhydrAMINE (BENADRYL) 25 mg capsule Take by mouth.   famotidine (PEPCID) 20 MG tablet Take by mouth.   fenofibrate (TRICOR) 145 MG tablet Take 1 tablet by mouth daily.   furosemide (LASIX) 20 MG tablet Take 1 tablet (20 mg total) by mouth daily. With extra prn weight gain   Glucosamine-Vitamin D 1000-200 MG-UNIT TABS Take 2 tablets by mouth daily.   influenza vac split quadrivalent PF (FLUARIX) 0.5 ML injection TO BE ADMINISTERED BY PHARMACIST FOR IMMUNIZATION   liothyronine (CYTOMEL) 5 MCG tablet Take 2 tablets by mouth daily.   meclizine (ANTIVERT) 25 MG tablet Take by mouth.   metoprolol succinate (TOPROL-XL) 25 MG 24 hr tablet Take 25 mg by mouth daily. Takes 2 tablets for excessive palpitations   Multiple Vitamin (MULTIVITAMIN) tablet Take 1 tablet by mouth daily.   rosuvastatin (CRESTOR) 10 MG tablet Take 10 mg by mouth at bedtime.   sertraline (ZOLOFT) 25 MG tablet Take 25 mg by mouth daily.   sildenafil (VIAGRA) 100 MG tablet Take 1 tablet (100 mg  total) by mouth daily as needed for erectile dysfunction.   warfarin (COUMADIN) 5 MG tablet Take 5 mg by mouth as directed.    warfarin (COUMADIN) 7.5 MG tablet SMARTSIG:0.5-1 Tablet(s) By Mouth As Directed   No facility-administered encounter medications on file as of 05/22/2022.   Reviewed hospital notes for details of recent visit. Has patient been contacted by Transitions of Care team? Yes Has patient seen PCP/specialist for hospital follow up (summarize OV if yes): Yes 05/15/22 remote INR monitoring --stable since last visit -Discharged 11-18 after sotalol load. He has returned to his usual vitamin K diet.   Admitted to the ED on 05/03/22. Discharge date was 05/06/22.  Discharged from  [facility].   Discharge diagnosis (Principal Problem): palpitations  Afib Patient was discharged to Home  Brief summary of hospital course:  Dylan Fletcher is a 61 y.o. male with a past medical history significant for LBBB, paroxysmal A-fib on warfarin, hypertrophic cardiomyopathy s/p mechanical MVR septal myomectomy at [facility] clinic in 2015 c/b small stroke, Mnire's disease resulting in chronic dizziness, HTN, HLD, and seizures who presented to [place] ED from home with a chief complaint of rapid heart rate with palpitations and fatigue. Patient underwent a TEE/DCCV on 05/03/22 with successful return to NSR. Home Toprol was stopped and patient was loaded on Sotolol '120mg'$ . Qtc has remained stable. Patient was noted to have somnolence and hospitalist was consulted. Patient was also noted to have elevated Ammonia and Leukocytosis.  Oncology was consulted for Leukocytosis recommends outpatient follow-up. Discharged to home   New?Medications Started at [facility] Discharge:?? -started sotalol 120 mg BID   Medications Discontinued at Hospital Discharge: -Stopped metoprolol  Medications that remain the same after Hospital Discharge:??  -All other medications will remain the same.     Next  CCM appt: none  Other upcoming appts: PCP appointment on 05/31/22  Charlene Brooke, PharmD notified and will determine if action is needed.    Dylan Fletcher, [place]  [phone]   Pharmacist addendum: Reviewed chart myself. Pt has spoken with TOC team and has PCP appt next week. Updated medication list with new sotalol and removed metoprolol.  Charlene Brooke, PharmD, BCACP 05/23/22 12:04 PM

## 2022-05-23 ENCOUNTER — Encounter: Payer: Self-pay | Admitting: Family Medicine

## 2022-05-23 DIAGNOSIS — Z952 Presence of prosthetic heart valve: Secondary | ICD-10-CM | POA: Diagnosis not present

## 2022-05-23 DIAGNOSIS — I421 Obstructive hypertrophic cardiomyopathy: Secondary | ICD-10-CM | POA: Diagnosis not present

## 2022-05-23 DIAGNOSIS — I4891 Unspecified atrial fibrillation: Secondary | ICD-10-CM | POA: Diagnosis not present

## 2022-05-23 DIAGNOSIS — I480 Paroxysmal atrial fibrillation: Secondary | ICD-10-CM | POA: Diagnosis not present

## 2022-05-23 NOTE — Addendum Note (Signed)
Addended by: Charlton Haws on: 05/23/2022 12:06 PM   Modules accepted: Orders

## 2022-05-24 ENCOUNTER — Telehealth (INDEPENDENT_AMBULATORY_CARE_PROVIDER_SITE_OTHER): Payer: Medicare Other | Admitting: Nurse Practitioner

## 2022-05-24 ENCOUNTER — Encounter: Payer: Self-pay | Admitting: Nurse Practitioner

## 2022-05-24 VITALS — BP 118/68 | HR 76 | Temp 99.8°F | Ht 65.5 in | Wt 213.0 lb

## 2022-05-24 DIAGNOSIS — R051 Acute cough: Secondary | ICD-10-CM

## 2022-05-24 DIAGNOSIS — U071 COVID-19: Secondary | ICD-10-CM

## 2022-05-24 HISTORY — DX: COVID-19: U07.1

## 2022-05-24 MED ORDER — GUAIFENESIN-CODEINE 100-10 MG/5ML PO SOLN: 5.0000 mL | Freq: Three times a day (TID) | ORAL | 0 refills | Status: AC | PRN

## 2022-05-24 MED ORDER — MOLNUPIRAVIR EUA 200MG CAPSULE: 4.0000 | ORAL_CAPSULE | Freq: Two times a day (BID) | ORAL | 0 refills | Status: AC

## 2022-05-24 NOTE — Assessment & Plan Note (Signed)
Tested positive at home for COVID-19.  Given patient's comorbidities and on anticoagulation and after joint discussion we will like to start patient on molnupiravir.  Did discuss signs and symptoms when to be reevaluated.  Did discuss CDC guidelines/recommendations in regards to self-isolation/quarantine.

## 2022-05-24 NOTE — Progress Notes (Signed)
Patient ID: Dylan Fletcher, male    DOB: [birth year], 61 y.o.   MRN: [id number]  Virtual visit completed through caregility, a video enabled telemedicine application. Due to national recommendations of social distancing due to COVID-19, a virtual visit is felt to be most appropriate for this patient at this time. Reviewed limitations, risks, security and privacy concerns of performing a virtual visit and the availability of in person appointments. I also reviewed that there may be a patient responsible charge related to this service. The patient agreed to proceed.   Patient location: home Provider location: [place] at [facility], office Persons participating in this virtual visit: patient, provider   If any vitals were documented, they were collected by patient at home unless specified below.    BP 118/68 (Patient Position: Sitting, Cuff Size: Normal)   Pulse 76   Temp 99.8 F (37.7 C) (Oral)   Ht 5' 5.5" (1.664 m)   Wt 213 lb (96.6 kg)   SpO2 94%   BMI 34.91 kg/m    CC: Covid 19 Subjective:   HPI: Dylan Fletcher is a 61 y.o. male presenting on 05/24/2022 for Covid Positive (Patient has been having fever, cough, congestion, chills, fatigue, not sleeping well since yesterday. Tested positive yesterday. Has been taking tylenol and OTC meds. )    Symptoms started on Monday Tested positive for covid yesterday Sister in law had covid  Has had covid vaccines Tylenol and mucinex that has not helped much Poor sleep due to cough    Relevant past medical, surgical, family and social history reviewed and updated as indicated. Interim medical history since our last visit reviewed. Allergies and medications reviewed and updated. Outpatient Medications Prior to Visit  Medication Sig Dispense Refill   acetaminophen (TYLENOL) 500 MG tablet Take 1 tablet (500 mg total) by mouth 2 (two) times daily.     Acetaminophen-Codeine 300-30 MG tablet TAKE 1 TABLET BY MOUTH 3 (THREE) TIMES  DAILY AS NEEDED FOR MODERATE PAIN (HEADACHE). 15 tablet 0   amoxicillin (AMOXIL) 500 MG tablet Take 500 mg by mouth as directed. Takes 4 tablets 1 hour prior to dental procedures     amphetamine-dextroamphetamine (ADDERALL XR) 20 MG 24 hr capsule Take 20 mg by mouth daily. Takes 2 tablets (40 mg) every morning     amphetamine-dextroamphetamine (ADDERALL) 30 MG tablet Take by mouth.     buPROPion (WELLBUTRIN XL) 150 MG 24 hr tablet Take 1 tablet by mouth every morning.     Cholecalciferol 50 MCG (2000 UT) CAPS 1 capsule every day by oral route.     diphenhydrAMINE (BENADRYL) 25 mg capsule Take by mouth.     famotidine (PEPCID) 20 MG tablet Take by mouth.     fenofibrate (TRICOR) 145 MG tablet Take 1 tablet by mouth daily.     furosemide (LASIX) 20 MG tablet Take 1 tablet (20 mg total) by mouth daily. With extra prn weight gain     Glucosamine-Vitamin D 1000-200 MG-UNIT TABS Take 2 tablets by mouth daily.     influenza vac split quadrivalent PF (FLUARIX) 0.5 ML injection TO BE ADMINISTERED BY PHARMACIST FOR IMMUNIZATION     liothyronine (CYTOMEL) 5 MCG tablet Take 2 tablets by mouth daily.     meclizine (ANTIVERT) 25 MG tablet Take by mouth.     Multiple Vitamin (MULTIVITAMIN) tablet Take 1 tablet by mouth daily.     rosuvastatin (CRESTOR) 10 MG tablet Take 10 mg by mouth at bedtime.  sertraline (ZOLOFT) 25 MG tablet Take 25 mg by mouth daily.     sildenafil (VIAGRA) 100 MG tablet Take 1 tablet (100 mg total) by mouth daily as needed for erectile dysfunction. 10 tablet 3   sotalol (BETAPACE) 120 MG tablet Take 120 mg by mouth 2 (two) times daily.     warfarin (COUMADIN) 5 MG tablet Take 5 mg by mouth as directed.      warfarin (COUMADIN) 7.5 MG tablet SMARTSIG:0.5-1 Tablet(s) By Mouth As Directed     No facility-administered medications prior to visit.     Per HPI unless specifically indicated in ROS section below Review of Systems  Constitutional:  Positive for appetite change, chills,  fatigue and fever.  HENT:  Positive for ear pain and sore throat (scratchy). Negative for ear discharge.   Respiratory:  Positive for cough and shortness of breath.   Cardiovascular:  Negative for chest pain.  Gastrointestinal:  Negative for abdominal pain, diarrhea, nausea and vomiting.  Neurological:  Positive for headaches.   Objective:  BP 118/68 (Patient Position: Sitting, Cuff Size: Normal)   Pulse 76   Temp 99.8 F (37.7 C) (Oral)   Ht 5' 5.5" (1.664 m)   Wt 213 lb (96.6 kg)   SpO2 94%   BMI 34.91 kg/m   Wt Readings from Last 3 Encounters:  05/24/22 213 lb (96.6 kg)  01/30/22 216 lb 4 oz (98.1 kg)  01/26/22 211 lb (95.7 kg)       Physical exam: Gen: alert, NAD, not ill appearing Pulm: speaks in complete sentences without increased work of breathing Psych: normal mood, normal thought content      Results for orders placed or performed in visit on 04/03/22  Serum protein electrophoresis with reflex  Result Value Ref Range   Total Protein 6.6 6.1 - 8.1 g/dL   Albumin ELP 4.2 3.8 - 4.8 g/dL   Alpha 1 0.3 0.2 - 0.3 g/dL   Alpha 2 0.5 0.5 - 0.9 g/dL   Beta Globulin 0.5 0.4 - 0.6 g/dL   Beta 2 0.3 0.2 - 0.5 g/dL   Gamma Globulin 0.9 0.8 - 1.7 g/dL   SPE Interp.    Parathyroid hormone, intact (no Ca)  Result Value Ref Range   PTH 28 16 - 77 pg/mL  CBC with Differential/Platelet  Result Value Ref Range   WBC 9.6 4.0 - 10.5 K/uL   RBC 4.60 4.22 - 5.81 Mil/uL   Hemoglobin 14.2 13.0 - 17.0 g/dL   HCT 42.9 39.0 - 52.0 %   MCV 93.3 78.0 - 100.0 fl   MCHC 33.2 30.0 - 36.0 g/dL   RDW 13.9 11.5 - 15.5 %   Platelets 272.0 150.0 - 400.0 K/uL   Neutrophils Relative % 43.1 43.0 - 77.0 %   Lymphocytes Relative 43.6 12.0 - 46.0 %   Monocytes Relative 9.9 3.0 - 12.0 %   Eosinophils Relative 2.6 0.0 - 5.0 %   Basophils Relative 0.8 0.0 - 3.0 %   Neutro Abs 4.1 1.4 - 7.7 K/uL   Lymphs Abs 4.2 (H) 0.7 - 4.0 K/uL   Monocytes Absolute 0.9 0.1 - 1.0 K/uL   Eosinophils Absolute  0.2 0.0 - 0.7 K/uL   Basophils Absolute 0.1 0.0 - 0.1 K/uL  Renal function panel  Result Value Ref Range   Sodium 141 135 - 145 mEq/L   Potassium 4.4 3.5 - 5.1 mEq/L   Chloride 106 96 - 112 mEq/L   CO2 28 19 - 32 mEq/L  Albumin 4.4 3.5 - 5.2 g/dL   BUN 19 6 - 23 mg/dL   Creatinine, Ser 1.36 0.40 - 1.50 mg/dL   Glucose, Bld 105 (H) 70 - 99 mg/dL   Phosphorus 1.9 (L) 2.3 - 4.6 mg/dL   GFR 55.53 (L) >60.00 mL/min   Calcium 9.3 8.4 - 10.5 mg/dL   Assessment & Plan:   Problem List Items Addressed This Visit       Other   Acute cough    Cough is causing patient the inability to sleep.  Will do some codeine-guaifenesin cough medication 3 times daily as needed dosing.  Sedation precautions reviewed.  PDMP reviewed      Relevant Medications   guaiFENesin-codeine 100-10 MG/5ML syrup   COVID-19 - Primary    Tested positive at home for COVID-19.  Given patient's comorbidities and on anticoagulation and after joint discussion we will like to start patient on molnupiravir.  Did discuss signs and symptoms when to be reevaluated.  Did discuss CDC guidelines/recommendations in regards to self-isolation/quarantine.      Relevant Medications   molnupiravir EUA (LAGEVRIO) 200 mg CAPS capsule     Meds ordered this encounter  Medications   molnupiravir EUA (LAGEVRIO) 200 mg CAPS capsule    Sig: Take 4 capsules (800 mg total) by mouth 2 (two) times daily for 5 days.    Dispense:  40 capsule    Refill:  0    Order Specific Question:   Supervising Provider    Answer:   Glori Bickers MARNE A [[id number]]   guaiFENesin-codeine 100-10 MG/5ML syrup    Sig: Take 5 mLs by mouth 3 (three) times daily as needed for up to 8 days for cough.    Dispense:  120 mL    Refill:  0    Order Specific Question:   Supervising Provider    Answer:   TOWER, MARNE A [[id number]]   No orders of the defined types were placed in this encounter.   I discussed the assessment and treatment plan with the patient. The patient was  provided an opportunity to ask questions and all were answered. The patient agreed with the plan and demonstrated an understanding of the instructions. The patient was advised to call back or seek an in-person evaluation if the symptoms worsen or if the condition fails to improve as anticipated.  Follow up plan: Return if symptoms worsen or fail to improve.  Romilda Garret, NP

## 2022-05-24 NOTE — Telephone Encounter (Addendum)
Spoke with pt/pt's wife, [name], scheduling MyChart visit with Catalina Antigua today at 9:00. Fyi to Dr. Darnell Level.

## 2022-05-24 NOTE — Assessment & Plan Note (Signed)
Cough is causing patient the inability to sleep.  Will do some codeine-guaifenesin cough medication 3 times daily as needed dosing.  Sedation precautions reviewed.  PDMP reviewed

## 2022-05-24 NOTE — Telephone Encounter (Signed)
Contacted the patient. The patient reports he is tolerating the sotalol at this time. He tested positive for covid-19 and is on medication at this time   Charlene Brooke, CPP notified  Avel Sensor, [place]  [phone]

## 2022-05-24 NOTE — Patient Instructions (Addendum)
Nice to see you today Sent the molnupiravir and cough medication to your pharmacy Follow up if you do not start improving You need to quarantine [facility] 05/27/2022. If you are starting to feel better and fever free for 24 hours then on 05/28/2022 you can go out but need to wear a mask while around other people for 5 days, [facility] 06/02/2022

## 2022-05-30 DIAGNOSIS — Z952 Presence of prosthetic heart valve: Secondary | ICD-10-CM | POA: Diagnosis not present

## 2022-05-31 ENCOUNTER — Telehealth (INDEPENDENT_AMBULATORY_CARE_PROVIDER_SITE_OTHER): Payer: Medicare Other | Admitting: Family Medicine

## 2022-05-31 ENCOUNTER — Encounter: Payer: Self-pay | Admitting: Family Medicine

## 2022-05-31 VITALS — BP 147/78 | HR 75 | Temp 97.5°F | Ht 65.5 in | Wt 210.0 lb

## 2022-05-31 DIAGNOSIS — I4891 Unspecified atrial fibrillation: Secondary | ICD-10-CM | POA: Diagnosis not present

## 2022-05-31 DIAGNOSIS — U071 COVID-19: Secondary | ICD-10-CM | POA: Diagnosis not present

## 2022-05-31 DIAGNOSIS — D72820 Lymphocytosis (symptomatic): Secondary | ICD-10-CM

## 2022-05-31 DIAGNOSIS — Z7901 Long term (current) use of anticoagulants: Secondary | ICD-10-CM

## 2022-05-31 DIAGNOSIS — I484 Atypical atrial flutter: Secondary | ICD-10-CM | POA: Diagnosis not present

## 2022-05-31 DIAGNOSIS — I480 Paroxysmal atrial fibrillation: Secondary | ICD-10-CM | POA: Diagnosis not present

## 2022-05-31 NOTE — Assessment & Plan Note (Signed)
Just completed molnupiravir course for COVID infection, tolerated well, symptoms are resolving well.

## 2022-05-31 NOTE — Assessment & Plan Note (Signed)
Now on sotalol after recent hospitalization for persistent tachycardia found to have atypical atrial flutter as per above.  He continues coumadin.

## 2022-05-31 NOTE — Assessment & Plan Note (Signed)
Newly noted during hospitalization WBC 13-14, LDH elevated.  Flow cytometry also abnormal while hospitalized - ?CLL, pending outpatient heme/onc f/u. They will keep me informed on evaluation.

## 2022-05-31 NOTE — Assessment & Plan Note (Signed)
Recent hospitalization for this, s/p DCCV and sotalol loading.  Followed by EP and cardiology.

## 2022-05-31 NOTE — Progress Notes (Signed)
Patient ID: [place], male    DOB: December 08, 1960, 61 y.o.   MRN: [id number]  Virtual visit completed through [employer], a video enabled telemedicine application. Due to national recommendations of social distancing due to COVID-19, a virtual visit is felt to be most appropriate for this patient at this time. Reviewed limitations, risks, security and privacy concerns of performing a virtual visit and the availability of in person appointments. I also reviewed that there may be a patient responsible charge related to this service. The patient agreed to proceed.   Patient location: home Provider location: [place] [facility], office Persons participating in this virtual visit: patient, provider, wife [name]  If any vitals were documented, they were collected by patient at home unless specified below.    BP (!) 147/78   Pulse 75   Temp (!) 97.5 F (36.4 C)   Ht 5' 5.5" (1.664 m)   Wt 210 lb (95.3 kg)   SpO2 99%   BMI 34.41 kg/m    CC: hosp f/u visit  Subjective:   HPI: Dylan [place] is a 61 y.o. male presenting on 05/31/2022 for Hospitalization Follow-up (Admitted on 05/03/22 at [facility], dx palpitations; wide complex tachycardia; PAF; subtherapeutic anticoagulation. Pt accompanied by wife, [name]. )   Recent hospitalization for persistent tachycardia with fatigue and palpitations. Found to have atypical atrial flutter. Admitted to [facility] s/p cardioversion and loaded with sotalol. Metoprolol ws discontinued.   Hospital records reviewed. Med rec performed.   Subsequently developed COVID infection (sick family member as well) - first day of symptoms was Monday 14/09/2021, treated with molnupiravir with benefit.  INR today was 5.3 - he has been in contact with cardiology coumadin clinic for dose titration.   Found to have high LDH and ammonia levels.  Oncology was consulted for elevated white cells with abnormal flow cytometry, ?CLL type monoclonal B-cell  lymphocytosis - planned outpatient follow up.   Home health not set up.  Other follow up appointments scheduled: saw cardiology NP virtually 05/23/2022, note reviewed.  Onc appt with Dr Waldon Reining 05/29/2022 cancelled due to [place]. Further labwork scheduled for 06/06/2022.  ______________________________________________________________________ Hospital admission: 05/03/2022 Hospital discharge: 05/06/2022 TCM f/u phone call: not performed   Discharge Diagnosis  Atrial flutter Elevated Ammonia level Leukocytosis  HTN HLD HOCOM s/p Myectomy      Relevant past medical, surgical, family and social history reviewed and updated as indicated. Interim medical history since our last visit reviewed. Allergies and medications reviewed and updated. Outpatient Medications Prior to Visit  Medication Sig Dispense Refill   acetaminophen (TYLENOL) 500 MG tablet Take 1 tablet (500 mg total) by mouth 2 (two) times daily.     Acetaminophen-Codeine 300-30 MG tablet TAKE 1 TABLET BY MOUTH 3 (THREE) TIMES DAILY AS NEEDED FOR MODERATE PAIN (HEADACHE). 15 tablet 0   amoxicillin (AMOXIL) 500 MG tablet Take 500 mg by mouth as directed. Takes 4 tablets 1 hour prior to dental procedures     amphetamine-dextroamphetamine (ADDERALL XR) 20 MG 24 hr capsule Take 20 mg by mouth daily. Takes 2 tablets (40 mg) every morning     amphetamine-dextroamphetamine (ADDERALL) 30 MG tablet Take by mouth.     buPROPion (WELLBUTRIN XL) 150 MG 24 hr tablet Take 1 tablet by mouth every morning.     Cholecalciferol 50 MCG (2000 UT) CAPS 1 capsule every day by oral route.     diphenhydrAMINE (BENADRYL) 25 mg capsule Take by mouth.     famotidine (PEPCID) 20 MG  tablet Take by mouth.     fenofibrate (TRICOR) 145 MG tablet Take 1 tablet by mouth daily.     furosemide (LASIX) 20 MG tablet Take 1 tablet (20 mg total) by mouth daily. With extra prn weight gain     Glucosamine-Vitamin D 1000-200 MG-UNIT TABS Take 2 tablets by mouth daily.      guaiFENesin-codeine 100-10 MG/5ML syrup Take 5 mLs by mouth 3 (three) times daily as needed for up to 8 days for cough. 120 mL 0   influenza vac split quadrivalent PF (FLUARIX) 0.5 ML injection TO BE ADMINISTERED BY PHARMACIST FOR IMMUNIZATION     liothyronine (CYTOMEL) 5 MCG tablet Take 2 tablets by mouth daily.     meclizine (ANTIVERT) 25 MG tablet Take by mouth.     Multiple Vitamin (MULTIVITAMIN) tablet Take 1 tablet by mouth daily.     rosuvastatin (CRESTOR) 10 MG tablet Take 10 mg by mouth at bedtime.     sertraline (ZOLOFT) 25 MG tablet Take 25 mg by mouth daily.     sildenafil (VIAGRA) 100 MG tablet Take 1 tablet (100 mg total) by mouth daily as needed for erectile dysfunction. 10 tablet 3   sotalol (BETAPACE) 120 MG tablet Take by mouth.     warfarin (COUMADIN) 5 MG tablet Take 5 mg by mouth as directed.      warfarin (COUMADIN) 7.5 MG tablet SMARTSIG:0.5-1 Tablet(s) By Mouth As Directed     sotalol (BETAPACE) 120 MG tablet Take 120 mg by mouth 2 (two) times daily.     No facility-administered medications prior to visit.     Per HPI unless specifically indicated in ROS section below Review of Systems Objective:  BP (!) 147/78   Pulse 75   Temp (!) 97.5 F (36.4 C)   Ht 5' 5.5" (1.664 m)   Wt 210 lb (95.3 kg)   SpO2 99%   BMI 34.41 kg/m   Wt Readings from Last 3 Encounters:  05/31/22 210 lb (95.3 kg)  05/24/22 213 lb (96.6 kg)  01/30/22 216 lb 4 oz (98.1 kg)       Physical exam: Gen: alert, NAD, not ill appearing Pulm: speaks in complete sentences without increased work of breathing Psych: normal mood, normal thought content      Results for orders placed or performed in visit on 04/03/22  Serum protein electrophoresis with reflex  Result Value Ref Range   Total Protein 6.6 6.1 - 8.1 g/dL   Albumin ELP 4.2 3.8 - 4.8 g/dL   Alpha 1 0.3 0.2 - 0.3 g/dL   Alpha 2 0.5 0.5 - 0.9 g/dL   Beta Globulin 0.5 0.4 - 0.6 g/dL   Beta 2 0.3 0.2 - 0.5 g/dL   Gamma Globulin  0.9 0.8 - 1.7 g/dL   SPE Interp.    Parathyroid hormone, intact (no Ca)  Result Value Ref Range   PTH 28 16 - 77 pg/mL  CBC with Differential/Platelet  Result Value Ref Range   WBC 9.6 4.0 - 10.5 K/uL   RBC 4.60 4.22 - 5.81 Mil/uL   Hemoglobin 14.2 13.0 - 17.0 g/dL   HCT 42.9 39.0 - 52.0 %   MCV 93.3 78.0 - 100.0 fl   MCHC 33.2 30.0 - 36.0 g/dL   RDW 13.9 11.5 - 15.5 %   Platelets 272.0 150.0 - 400.0 K/uL   Neutrophils Relative % 43.1 43.0 - 77.0 %   Lymphocytes Relative 43.6 12.0 - 46.0 %   Monocytes Relative 9.9 3.0 -  12.0 %   Eosinophils Relative 2.6 0.0 - 5.0 %   Basophils Relative 0.8 0.0 - 3.0 %   Neutro Abs 4.1 1.4 - 7.7 K/uL   Lymphs Abs 4.2 (H) 0.7 - 4.0 K/uL   Monocytes Absolute 0.9 0.1 - 1.0 K/uL   Eosinophils Absolute 0.2 0.0 - 0.7 K/uL   Basophils Absolute 0.1 0.0 - 0.1 K/uL  Renal function panel  Result Value Ref Range   Sodium 141 135 - 145 mEq/L   Potassium 4.4 3.5 - 5.1 mEq/L   Chloride 106 96 - 112 mEq/L   CO2 28 19 - 32 mEq/L   Albumin 4.4 3.5 - 5.2 g/dL   BUN 19 6 - 23 mg/dL   Creatinine, Ser 1.36 0.40 - 1.50 mg/dL   Glucose, Bld 105 (H) 70 - 99 mg/dL   Phosphorus 1.9 (L) 2.3 - 4.6 mg/dL   GFR 55.53 (L) >60.00 mL/min   Calcium 9.3 8.4 - 10.5 mg/dL   Assessment & Plan:   Problem List Items Addressed This Visit       Unprioritized   Lymphocytosis    Newly noted during hospitalization WBC 13-14, LDH elevated.  Flow cytometry also abnormal while hospitalized - ?CLL, pending outpatient heme/onc f/u. They will keep me informed on evaluation.      Paroxysmal atrial fibrillation (HCC)    Now on sotalol after recent hospitalization for persistent tachycardia found to have atypical atrial flutter as per above.  He continues coumadin.       Relevant Medications   sotalol (BETAPACE) 120 MG tablet   Chronic anticoagulation    Continues lifelong coumadin - recent INR supratherapeutic, working with cardiology coumadin clinic.       COVID-19    Just  completed molnupiravir course for COVID infection, tolerated well, symptoms are resolving well.       Atypical atrial flutter (HCC) - Primary    Recent hospitalization for this, s/p DCCV and sotalol loading.  Followed by EP and cardiology.       Relevant Medications   sotalol (BETAPACE) 120 MG tablet     No orders of the defined types were placed in this encounter.  No orders of the defined types were placed in this encounter.   I discussed the assessment and treatment plan with the patient. The patient was provided an opportunity to ask questions and all were answered. The patient agreed with the plan and demonstrated an understanding of the instructions. The patient was advised to call back or seek an in-person evaluation if the symptoms worsen or if the condition fails to improve as anticipated.  Follow up plan: No follow-ups on file.  Ria Bush, MD

## 2022-05-31 NOTE — Assessment & Plan Note (Signed)
Continues lifelong coumadin - recent INR supratherapeutic, working with cardiology coumadin clinic.

## 2022-06-01 DIAGNOSIS — I4891 Unspecified atrial fibrillation: Secondary | ICD-10-CM | POA: Diagnosis not present

## 2022-06-06 DIAGNOSIS — I4891 Unspecified atrial fibrillation: Secondary | ICD-10-CM | POA: Diagnosis not present

## 2022-06-06 DIAGNOSIS — D72829 Elevated white blood cell count, unspecified: Secondary | ICD-10-CM | POA: Diagnosis not present

## 2022-06-08 ENCOUNTER — Encounter: Payer: Self-pay | Admitting: Family Medicine

## 2022-06-08 MED ORDER — GUAIFENESIN-CODEINE 100-10 MG/5ML PO SYRP: 5.0000 mL | ORAL_SOLUTION | Freq: Three times a day (TID) | ORAL | 0 refills | Status: DC | PRN

## 2022-06-09 DIAGNOSIS — I4891 Unspecified atrial fibrillation: Secondary | ICD-10-CM | POA: Diagnosis not present

## 2022-06-10 DIAGNOSIS — Z886 Allergy status to analgesic agent status: Secondary | ICD-10-CM | POA: Diagnosis not present

## 2022-06-10 DIAGNOSIS — Z91013 Allergy to seafood: Secondary | ICD-10-CM | POA: Diagnosis not present

## 2022-06-10 DIAGNOSIS — R059 Cough, unspecified: Secondary | ICD-10-CM | POA: Diagnosis not present

## 2022-06-10 DIAGNOSIS — Z9109 Other allergy status, other than to drugs and biological substances: Secondary | ICD-10-CM | POA: Diagnosis not present

## 2022-06-10 DIAGNOSIS — Z888 Allergy status to other drugs, medicaments and biological substances status: Secondary | ICD-10-CM | POA: Diagnosis not present

## 2022-06-21 DIAGNOSIS — I480 Paroxysmal atrial fibrillation: Secondary | ICD-10-CM | POA: Diagnosis not present

## 2022-06-28 DIAGNOSIS — Z952 Presence of prosthetic heart valve: Secondary | ICD-10-CM | POA: Diagnosis not present

## 2022-07-06 DIAGNOSIS — F411 Generalized anxiety disorder: Secondary | ICD-10-CM | POA: Diagnosis not present

## 2022-07-06 DIAGNOSIS — F3341 Major depressive disorder, recurrent, in partial remission: Secondary | ICD-10-CM | POA: Diagnosis not present

## 2022-07-06 DIAGNOSIS — Z79899 Other long term (current) drug therapy: Secondary | ICD-10-CM | POA: Diagnosis not present

## 2022-07-06 DIAGNOSIS — I69311 Memory deficit following cerebral infarction: Secondary | ICD-10-CM | POA: Diagnosis not present

## 2022-08-02 ENCOUNTER — Encounter: Payer: Self-pay | Admitting: Family Medicine

## 2022-08-02 ENCOUNTER — Ambulatory Visit (INDEPENDENT_AMBULATORY_CARE_PROVIDER_SITE_OTHER): Payer: Medicare Other | Admitting: Family Medicine

## 2022-08-02 VITALS — BP 130/72 | HR 65 | Temp 97.2°F | Ht 65.5 in | Wt 216.1 lb

## 2022-08-02 DIAGNOSIS — D472 Monoclonal gammopathy: Secondary | ICD-10-CM

## 2022-08-02 DIAGNOSIS — E291 Testicular hypofunction: Secondary | ICD-10-CM

## 2022-08-02 DIAGNOSIS — I484 Atypical atrial flutter: Secondary | ICD-10-CM | POA: Diagnosis not present

## 2022-08-02 NOTE — Progress Notes (Unsigned)
Patient ID: Dylan [place], male    DOB: [birth year], 62 y.o.   MRN: [id number]  This visit was conducted in person.  BP 130/72   Pulse 65   Temp (!) 97.2 F (36.2 C) (Temporal)   Ht 5' 5.5" (1.664 m)   Wt 216 lb 2 oz (98 kg)   SpO2 100%   BMI 35.42 kg/m    CC: 6 mo f/u visit  Subjective:   HPI: Dylan [place] is a 62 y.o. male presenting on 08/02/2022 for Medical Management of Chronic Issues (Here for 6 mo f/u. Pt accompanied by wife, Dylan [place].)   Known HOCM, LBBB, Pafib, s/p ventricular septal myectomy and MVR followed by [facility] cardiology. Continues regular follow up with cardiology and EP.   Stressful period worrying about 23yo father - who lives in [place]. Notes increased depressed mood. Sertraline recently increased to 85m daily.   COVID 05/2022 - treated with molnupiravir, symptoms are better.  Was hospitalized 04/2022 for persistent tachycardia due to atypical atrial flutter s/p cardioversion, started on sotalol in place of metoprolol.   2 falls recently when he went to backyard to play disc golf.   Since last seen, has seen heme/onc [place] monoclonal B-cell gammopathy vs early CLL. Planning to see onc Q6 months.   He was released from endocrinology ([place] - was following for low testosterone. Off clomid since 10/2019. Rec yearly testosterone and TSH check.      Relevant past medical, surgical, family and social history reviewed and updated as indicated. Interim medical history since our last visit reviewed. Allergies and medications reviewed and updated. Outpatient Medications Prior to Visit  Medication Sig Dispense Refill   acetaminophen (TYLENOL) 500 MG tablet Take 1 tablet (500 mg total) by mouth 2 (two) times daily.     Acetaminophen-Codeine 300-30 MG tablet TAKE 1 TABLET BY MOUTH 3 (THREE) TIMES DAILY AS NEEDED FOR MODERATE PAIN (HEADACHE). 15 tablet 0   amoxicillin (AMOXIL) 500 MG tablet Take 500 mg by mouth as directed. Takes 4 tablets 1 hour  prior to dental procedures     amphetamine-dextroamphetamine (ADDERALL XR) 20 MG 24 hr capsule Take 20 mg by mouth daily. Takes 2 tablets (40 mg) every morning     amphetamine-dextroamphetamine (ADDERALL) 30 MG tablet Take by mouth.     buPROPion (WELLBUTRIN XL) 150 MG 24 hr tablet Take 1 tablet by mouth every morning.     Cholecalciferol 50 MCG (2000 UT) CAPS 1 capsule every day by oral route.     diphenhydrAMINE (BENADRYL) 25 mg capsule Take by mouth.     famotidine (PEPCID) 20 MG tablet Take by mouth.     fenofibrate (TRICOR) 145 MG tablet Take 1 tablet by mouth daily.     furosemide (LASIX) 20 MG tablet Take 1 tablet (20 mg total) by mouth daily. With extra prn weight gain     Glucosamine-Vitamin D 1000-200 MG-UNIT TABS Take 2 tablets by mouth daily.     guaiFENesin-codeine (ROBITUSSIN AC) 100-10 MG/5ML syrup Take 5 mLs by mouth 3 (three) times daily as needed for cough. 120 mL 0   influenza vac split quadrivalent PF (FLUARIX) 0.5 ML injection TO BE ADMINISTERED BY PHARMACIST FOR IMMUNIZATION     liothyronine (CYTOMEL) 5 MCG tablet Take 2 tablets by mouth daily.     meclizine (ANTIVERT) 25 MG tablet Take by mouth.     Multiple Vitamin (MULTIVITAMIN) tablet Take 1 tablet by mouth daily.     rosuvastatin (CRESTOR)  10 MG tablet Take 10 mg by mouth at bedtime.     sertraline (ZOLOFT) 50 MG tablet Take 50 mg by mouth daily.     sildenafil (VIAGRA) 100 MG tablet Take 1 tablet (100 mg total) by mouth daily as needed for erectile dysfunction. 10 tablet 3   sotalol (BETAPACE) 120 MG tablet Take by mouth.     warfarin (COUMADIN) 5 MG tablet Take 5 mg by mouth as directed.      warfarin (COUMADIN) 7.5 MG tablet SMARTSIG:0.5-1 Tablet(s) By Mouth As Directed     sertraline (ZOLOFT) 25 MG tablet Take 25 mg by mouth daily.     No facility-administered medications prior to visit.     Per HPI unless specifically indicated in ROS section below Review of Systems  Objective:  BP 130/72   Pulse 65    Temp (!) 97.2 F (36.2 C) (Temporal)   Ht 5' 5.5" (1.664 m)   Wt 216 lb 2 oz (98 kg)   SpO2 100%   BMI 35.42 kg/m   Wt Readings from Last 3 Encounters:  08/02/22 216 lb 2 oz (98 kg)  05/31/22 210 lb (95.3 kg)  05/24/22 213 lb (96.6 kg)      Physical Exam Vitals and nursing note reviewed.  Constitutional:      Appearance: Normal appearance. He is not ill-appearing.  HENT:     Mouth/Throat:     Mouth: Mucous membranes are moist.     Pharynx: Oropharynx is clear. No oropharyngeal exudate or posterior oropharyngeal erythema.  Eyes:     Extraocular Movements: Extraocular movements intact.     Pupils: Pupils are equal, round, and reactive to light.  Cardiovascular:     Rate and Rhythm: Normal rate and regular rhythm.     Pulses: Normal pulses.     Heart sounds: Murmur (post-MVR click) heard.  Pulmonary:     Effort: Pulmonary effort is normal. No respiratory distress.     Breath sounds: Normal breath sounds. No wheezing, rhonchi or rales.  Musculoskeletal:     Right lower leg: No edema.     Left lower leg: No edema.  Neurological:     Mental Status: He is alert.  Psychiatric:        Mood and Affect: Mood normal.        Behavior: Behavior normal.          08/02/2022   10:12 AM 01/26/2022    9:20 AM 01/19/2021   11:45 AM 11/10/2019    3:46 PM 06/24/2018   10:42 AM  Depression screen PHQ 2/9  Decreased Interest 1 0 0 0 0  Down, Depressed, Hopeless 1 1 0 0 0  PHQ - 2 Score 2 1 0 0 0  Altered sleeping 1 0 0 0   Tired, decreased energy 1 0 3 0   Change in appetite 2 0 0 0   Feeling bad or failure about yourself  0 0 1 0   Trouble concentrating 0 0 3 0   Moving slowly or fidgety/restless 0 0 3 0   Suicidal thoughts 0 0 0 0   PHQ-9 Score 6 1 10 $ 0   Difficult doing work/chores Somewhat difficult Not difficult at all  Not difficult at all        08/02/2022   10:12 AM 01/19/2021   11:45 AM  GAD 7 : Generalized Anxiety Score  Nervous, Anxious, on Edge 0 0  Control/stop  worrying 1 1  Worry too much - different  things 0 0  Trouble relaxing 1 0  Restless 0 0  Easily annoyed or irritable 1 2  Afraid - awful might happen 1 0  Total GAD 7 Score 4 3  Anxiety Difficulty Somewhat difficult    Assessment & Plan:   Problem List Items Addressed This Visit     Monoclonal gammopathy    Monoclonal B-cell gammopathy vs early CLL - seeing hematology Q29mo       Hypogonadism in male    Released by endo - rec yearly testosterone level, TSH.       Atypical atrial flutter (HCC) - Primary    Continues doing well on sotalol, followed by EP and cardiology.         No orders of the defined types were placed in this encounter.   No orders of the defined types were placed in this encounter.   Patient Instructions  Good to see you today We will check testosterone and thyroid yearly  Return as needed or in 6 months for wellness visit.   Follow up plan: Return in about 6 months (around 01/31/2023) for medicare wellness visit.  JRia Bush MD

## 2022-08-02 NOTE — Patient Instructions (Signed)
Good to see you today We will check testosterone and thyroid yearly  Return as needed or in 6 months for wellness visit.

## 2022-08-03 ENCOUNTER — Encounter: Payer: Self-pay | Admitting: Family Medicine

## 2022-08-03 NOTE — Assessment & Plan Note (Signed)
Continues doing well on sotalol, followed by EP and cardiology.

## 2022-08-03 NOTE — Assessment & Plan Note (Signed)
Monoclonal B-cell gammopathy vs early CLL - seeing hematology Q95mo

## 2022-08-03 NOTE — Assessment & Plan Note (Addendum)
Released by endo - rec yearly testosterone level, TSH.

## 2022-08-09 DIAGNOSIS — F411 Generalized anxiety disorder: Secondary | ICD-10-CM | POA: Diagnosis not present

## 2022-08-09 DIAGNOSIS — F3341 Major depressive disorder, recurrent, in partial remission: Secondary | ICD-10-CM | POA: Diagnosis not present

## 2022-08-09 DIAGNOSIS — Z79899 Other long term (current) drug therapy: Secondary | ICD-10-CM | POA: Diagnosis not present

## 2022-08-09 DIAGNOSIS — F900 Attention-deficit hyperactivity disorder, predominantly inattentive type: Secondary | ICD-10-CM | POA: Diagnosis not present

## 2022-08-09 DIAGNOSIS — I69311 Memory deficit following cerebral infarction: Secondary | ICD-10-CM | POA: Diagnosis not present

## 2022-08-24 DIAGNOSIS — Z952 Presence of prosthetic heart valve: Secondary | ICD-10-CM | POA: Diagnosis not present

## 2022-08-29 DIAGNOSIS — G9340 Encephalopathy, unspecified: Secondary | ICD-10-CM | POA: Diagnosis not present

## 2022-09-05 DIAGNOSIS — Z79899 Other long term (current) drug therapy: Secondary | ICD-10-CM | POA: Diagnosis not present

## 2022-09-05 DIAGNOSIS — F411 Generalized anxiety disorder: Secondary | ICD-10-CM | POA: Diagnosis not present

## 2022-09-05 DIAGNOSIS — F3342 Major depressive disorder, recurrent, in full remission: Secondary | ICD-10-CM | POA: Diagnosis not present

## 2022-09-05 DIAGNOSIS — I69311 Memory deficit following cerebral infarction: Secondary | ICD-10-CM | POA: Diagnosis not present

## 2022-09-21 DIAGNOSIS — Z952 Presence of prosthetic heart valve: Secondary | ICD-10-CM | POA: Diagnosis not present

## 2022-09-21 DIAGNOSIS — I421 Obstructive hypertrophic cardiomyopathy: Secondary | ICD-10-CM | POA: Diagnosis not present

## 2022-09-21 DIAGNOSIS — I480 Paroxysmal atrial fibrillation: Secondary | ICD-10-CM | POA: Diagnosis not present

## 2022-09-21 DIAGNOSIS — I4891 Unspecified atrial fibrillation: Secondary | ICD-10-CM | POA: Diagnosis not present

## 2022-09-21 DIAGNOSIS — G4730 Sleep apnea, unspecified: Secondary | ICD-10-CM | POA: Diagnosis not present

## 2022-09-24 ENCOUNTER — Other Ambulatory Visit: Payer: Self-pay | Admitting: Family Medicine

## 2022-09-25 NOTE — Telephone Encounter (Signed)
Refill request Tylenol #3 Last office visit 08/02/22 Last refill  04/07/20 #15

## 2022-09-26 MED ORDER — ACETAMINOPHEN-CODEINE 300-30 MG PO TABS: 1.0000 | ORAL_TABLET | Freq: Three times a day (TID) | ORAL | 0 refills | Status: AC | PRN

## 2022-09-26 NOTE — Telephone Encounter (Signed)
ERx 

## 2022-10-03 DIAGNOSIS — I421 Obstructive hypertrophic cardiomyopathy: Secondary | ICD-10-CM | POA: Diagnosis not present

## 2022-10-03 DIAGNOSIS — I480 Paroxysmal atrial fibrillation: Secondary | ICD-10-CM | POA: Diagnosis not present

## 2022-10-03 DIAGNOSIS — R0602 Shortness of breath: Secondary | ICD-10-CM | POA: Diagnosis not present

## 2022-10-03 DIAGNOSIS — E559 Vitamin D deficiency, unspecified: Secondary | ICD-10-CM | POA: Diagnosis not present

## 2022-10-03 DIAGNOSIS — R5383 Other fatigue: Secondary | ICD-10-CM | POA: Diagnosis not present

## 2022-10-03 DIAGNOSIS — I447 Left bundle-branch block, unspecified: Secondary | ICD-10-CM | POA: Diagnosis not present

## 2022-10-03 DIAGNOSIS — Z952 Presence of prosthetic heart valve: Secondary | ICD-10-CM | POA: Diagnosis not present

## 2022-10-03 DIAGNOSIS — E782 Mixed hyperlipidemia: Secondary | ICD-10-CM | POA: Diagnosis not present

## 2022-10-10 DIAGNOSIS — Z952 Presence of prosthetic heart valve: Secondary | ICD-10-CM | POA: Diagnosis not present

## 2022-10-11 ENCOUNTER — Other Ambulatory Visit: Payer: Self-pay | Admitting: Family Medicine

## 2022-10-11 DIAGNOSIS — E785 Hyperlipidemia, unspecified: Secondary | ICD-10-CM

## 2022-10-11 NOTE — Telephone Encounter (Signed)
Spoke to pt's wife, scheduled cpe for 01/31/23

## 2022-10-11 NOTE — Telephone Encounter (Signed)
E-scribed refill.  Pt has MCR wellness on 01/29/23. Plz schedule CPE and fasting lab visits after this date to prevent delays in future refills.

## 2022-10-24 DIAGNOSIS — R0602 Shortness of breath: Secondary | ICD-10-CM | POA: Diagnosis not present

## 2022-10-27 DIAGNOSIS — I4891 Unspecified atrial fibrillation: Secondary | ICD-10-CM | POA: Diagnosis not present

## 2022-11-01 DIAGNOSIS — Z79899 Other long term (current) drug therapy: Secondary | ICD-10-CM | POA: Diagnosis not present

## 2022-11-01 DIAGNOSIS — I69311 Memory deficit following cerebral infarction: Secondary | ICD-10-CM | POA: Diagnosis not present

## 2022-11-01 DIAGNOSIS — F411 Generalized anxiety disorder: Secondary | ICD-10-CM | POA: Diagnosis not present

## 2022-11-01 DIAGNOSIS — F3342 Major depressive disorder, recurrent, in full remission: Secondary | ICD-10-CM | POA: Diagnosis not present

## 2022-12-06 DIAGNOSIS — Z952 Presence of prosthetic heart valve: Secondary | ICD-10-CM | POA: Diagnosis not present

## 2022-12-08 DIAGNOSIS — G4733 Obstructive sleep apnea (adult) (pediatric): Secondary | ICD-10-CM | POA: Diagnosis not present

## 2022-12-08 DIAGNOSIS — Z952 Presence of prosthetic heart valve: Secondary | ICD-10-CM | POA: Diagnosis not present

## 2022-12-08 DIAGNOSIS — Z91013 Allergy to seafood: Secondary | ICD-10-CM | POA: Diagnosis not present

## 2022-12-08 DIAGNOSIS — E785 Hyperlipidemia, unspecified: Secondary | ICD-10-CM | POA: Diagnosis not present

## 2022-12-08 DIAGNOSIS — F909 Attention-deficit hyperactivity disorder, unspecified type: Secondary | ICD-10-CM | POA: Diagnosis not present

## 2022-12-08 DIAGNOSIS — I480 Paroxysmal atrial fibrillation: Secondary | ICD-10-CM | POA: Diagnosis not present

## 2022-12-08 DIAGNOSIS — Z7901 Long term (current) use of anticoagulants: Secondary | ICD-10-CM | POA: Diagnosis not present

## 2022-12-08 DIAGNOSIS — Z886 Allergy status to analgesic agent status: Secondary | ICD-10-CM | POA: Diagnosis not present

## 2022-12-08 DIAGNOSIS — I484 Atypical atrial flutter: Secondary | ICD-10-CM | POA: Diagnosis not present

## 2022-12-08 DIAGNOSIS — D72820 Lymphocytosis (symptomatic): Secondary | ICD-10-CM | POA: Diagnosis not present

## 2022-12-08 DIAGNOSIS — I509 Heart failure, unspecified: Secondary | ICD-10-CM | POA: Diagnosis not present

## 2022-12-08 DIAGNOSIS — I110 Hypertensive heart disease with heart failure: Secondary | ICD-10-CM | POA: Diagnosis not present

## 2022-12-08 DIAGNOSIS — K219 Gastro-esophageal reflux disease without esophagitis: Secondary | ICD-10-CM | POA: Diagnosis not present

## 2022-12-08 DIAGNOSIS — Z888 Allergy status to other drugs, medicaments and biological substances status: Secondary | ICD-10-CM | POA: Diagnosis not present

## 2022-12-08 DIAGNOSIS — Z8673 Personal history of transient ischemic attack (TIA), and cerebral infarction without residual deficits: Secondary | ICD-10-CM | POA: Diagnosis not present

## 2022-12-08 DIAGNOSIS — D72829 Elevated white blood cell count, unspecified: Secondary | ICD-10-CM | POA: Diagnosis not present

## 2022-12-08 DIAGNOSIS — Z79899 Other long term (current) drug therapy: Secondary | ICD-10-CM | POA: Diagnosis not present

## 2022-12-08 DIAGNOSIS — I422 Other hypertrophic cardiomyopathy: Secondary | ICD-10-CM | POA: Diagnosis not present

## 2022-12-13 DIAGNOSIS — I4891 Unspecified atrial fibrillation: Secondary | ICD-10-CM | POA: Diagnosis not present

## 2022-12-18 DIAGNOSIS — R5383 Other fatigue: Secondary | ICD-10-CM | POA: Diagnosis not present

## 2022-12-18 DIAGNOSIS — Z952 Presence of prosthetic heart valve: Secondary | ICD-10-CM | POA: Diagnosis not present

## 2022-12-18 DIAGNOSIS — I421 Obstructive hypertrophic cardiomyopathy: Secondary | ICD-10-CM | POA: Diagnosis not present

## 2022-12-18 DIAGNOSIS — I5032 Chronic diastolic (congestive) heart failure: Secondary | ICD-10-CM | POA: Diagnosis not present

## 2022-12-18 DIAGNOSIS — I480 Paroxysmal atrial fibrillation: Secondary | ICD-10-CM | POA: Diagnosis not present

## 2022-12-18 DIAGNOSIS — I484 Atypical atrial flutter: Secondary | ICD-10-CM | POA: Diagnosis not present

## 2022-12-18 DIAGNOSIS — I422 Other hypertrophic cardiomyopathy: Secondary | ICD-10-CM | POA: Diagnosis not present

## 2023-01-17 ENCOUNTER — Encounter (INDEPENDENT_AMBULATORY_CARE_PROVIDER_SITE_OTHER): Payer: Self-pay

## 2023-01-17 DIAGNOSIS — Z952 Presence of prosthetic heart valve: Secondary | ICD-10-CM | POA: Diagnosis not present

## 2023-01-20 ENCOUNTER — Other Ambulatory Visit: Payer: Self-pay | Admitting: Family Medicine

## 2023-01-20 DIAGNOSIS — Z125 Encounter for screening for malignant neoplasm of prostate: Secondary | ICD-10-CM

## 2023-01-20 DIAGNOSIS — E291 Testicular hypofunction: Secondary | ICD-10-CM

## 2023-01-20 DIAGNOSIS — E559 Vitamin D deficiency, unspecified: Secondary | ICD-10-CM

## 2023-01-20 DIAGNOSIS — E785 Hyperlipidemia, unspecified: Secondary | ICD-10-CM

## 2023-01-20 DIAGNOSIS — I480 Paroxysmal atrial fibrillation: Secondary | ICD-10-CM

## 2023-01-20 NOTE — Addendum Note (Signed)
Addended by: Eustaquio Boyden on: 01/20/2023 10:30 AM   Modules accepted: Orders

## 2023-01-24 ENCOUNTER — Other Ambulatory Visit (INDEPENDENT_AMBULATORY_CARE_PROVIDER_SITE_OTHER): Payer: Medicare Other

## 2023-01-24 DIAGNOSIS — E559 Vitamin D deficiency, unspecified: Secondary | ICD-10-CM

## 2023-01-24 DIAGNOSIS — I480 Paroxysmal atrial fibrillation: Secondary | ICD-10-CM

## 2023-01-24 DIAGNOSIS — E785 Hyperlipidemia, unspecified: Secondary | ICD-10-CM

## 2023-01-24 DIAGNOSIS — E291 Testicular hypofunction: Secondary | ICD-10-CM

## 2023-01-24 DIAGNOSIS — Z125 Encounter for screening for malignant neoplasm of prostate: Secondary | ICD-10-CM | POA: Diagnosis not present

## 2023-01-24 LAB — CBC WITH DIFFERENTIAL/PLATELET
Basophils Absolute: 0 10*3/uL (ref 0.0–0.1)
Basophils Relative: 0.4 % (ref 0.0–3.0)
Eosinophils Absolute: 0.2 10*3/uL (ref 0.0–0.7)
Eosinophils Relative: 1.6 % (ref 0.0–5.0)
HCT: 43.8 % (ref 39.0–52.0)
Hemoglobin: 14.1 g/dL (ref 13.0–17.0)
Lymphocytes Relative: 49.5 % — ABNORMAL HIGH (ref 12.0–46.0)
Lymphs Abs: 5.8 10*3/uL — ABNORMAL HIGH (ref 0.7–4.0)
MCHC: 32.2 g/dL (ref 30.0–36.0)
MCV: 94.5 fl (ref 78.0–100.0)
Monocytes Absolute: 1.1 10*3/uL — ABNORMAL HIGH (ref 0.1–1.0)
Monocytes Relative: 9.4 % (ref 3.0–12.0)
Neutro Abs: 4.6 10*3/uL (ref 1.4–7.7)
Neutrophils Relative %: 39.1 % — ABNORMAL LOW (ref 43.0–77.0)
Platelets: 274 10*3/uL (ref 150.0–400.0)
RBC: 4.63 Mil/uL (ref 4.22–5.81)
RDW: 14.5 % (ref 11.5–15.5)
WBC: 11.7 10*3/uL — ABNORMAL HIGH (ref 4.0–10.5)

## 2023-01-24 LAB — TESTOSTERONE: Testosterone: 235.36 ng/dL — ABNORMAL LOW (ref 300.00–890.00)

## 2023-01-24 LAB — COMPREHENSIVE METABOLIC PANEL
ALT: 19 U/L (ref 0–53)
AST: 26 U/L (ref 0–37)
Albumin: 4.3 g/dL (ref 3.5–5.2)
Alkaline Phosphatase: 26 U/L — ABNORMAL LOW (ref 39–117)
BUN: 22 mg/dL (ref 6–23)
CO2: 30 mEq/L (ref 19–32)
Calcium: 9.3 mg/dL (ref 8.4–10.5)
Chloride: 103 mEq/L (ref 96–112)
Creatinine, Ser: 1.42 mg/dL (ref 0.40–1.50)
GFR: 52.43 mL/min — ABNORMAL LOW (ref >60.00)
Glucose, Bld: 104 mg/dL — ABNORMAL HIGH (ref 70–99)
Potassium: 4.2 mEq/L (ref 3.5–5.1)
Sodium: 139 mEq/L (ref 135–145)
Total Bilirubin: 0.5 mg/dL (ref 0.2–1.2)
Total Protein: 6.4 g/dL (ref 6.0–8.3)

## 2023-01-24 LAB — LIPID PANEL
Cholesterol: 137 mg/dL (ref 0–200)
HDL: 39.9 mg/dL (ref >39.00)
LDL Cholesterol: 78 mg/dL (ref 0–99)
NonHDL: 97.28
Total CHOL/HDL Ratio: 3
Triglycerides: 95 mg/dL (ref 0.0–149.0)
VLDL: 19 mg/dL (ref 0.0–40.0)

## 2023-01-24 LAB — VITAMIN D 25 HYDROXY (VIT D DEFICIENCY, FRACTURES): VITD: 61.91 ng/mL (ref 30.00–100.00)

## 2023-01-24 LAB — PSA, MEDICARE: PSA: 1.09 ng/ml (ref 0.10–4.00)

## 2023-01-24 LAB — TSH: TSH: 0.88 u[IU]/mL (ref 0.35–5.50)

## 2023-01-29 ENCOUNTER — Ambulatory Visit (INDEPENDENT_AMBULATORY_CARE_PROVIDER_SITE_OTHER): Payer: Medicare Other

## 2023-01-29 VITALS — Ht 66.0 in | Wt 222.0 lb

## 2023-01-29 DIAGNOSIS — Z0000 Encounter for general adult medical examination without abnormal findings: Secondary | ICD-10-CM

## 2023-01-29 NOTE — Progress Notes (Signed)
Subjective:   Dylan Fletcher is a 62 y.o. male who presents for Medicare Annual/Subsequent preventive examination.  Visit Complete: Virtual  I connected with  Dylan Fletcher on 01/29/23 by a audio enabled telemedicine application and verified that I am speaking with the correct person using two identifiers.  Patient Location: Home  Provider Location: Home Office  I discussed the limitations of evaluation and management by telemedicine. The patient expressed understanding and agreed to proceed.  Patient Medicare AWV questionnaire was completed by the patient on 01/29/23; I have confirmed that all information answered by patient is correct and no changes since this date.  Vital Signs: Unable to obtain new vitals due to this being a telehealth visit.   Review of Systems      Cardiac Risk Factors include: advanced age (>36men, >61 women);obesity (BMI >30kg/m2);sedentary lifestyle;hypertension;dyslipidemia     Objective:    Today's Vitals   01/29/23 1016  Weight: 222 lb (100.7 kg)  Height: 5\' 6"  (1.676 m)   Body mass index is 35.83 kg/m.     01/29/2023   10:40 AM 01/26/2022    9:23 AM 11/10/2019    3:41 PM 01/29/2016    5:07 PM 12/18/2014    2:07 PM  Advanced Directives  Does Patient Have a Medical Advance Directive? Yes No No No No  Type of Estate agent of [place];Living will      Does patient want to make changes to medical advance directive? No - Patient declined No - Patient declined No - Patient declined    Copy of Healthcare Power of Attorney in Chart? Yes - validated most recent copy scanned in chart (See row information)      Would patient like information on creating a medical advance directive?  No - Patient declined  No - patient declined information Yes - Educational materials given    Current Medications (verified) Outpatient Encounter Medications as of 01/29/2023  Medication Sig   acetaminophen (TYLENOL) 500 MG tablet Take 1 tablet  (500 mg total) by mouth 2 (two) times daily.   acetaminophen-codeine (TYLENOL #3) 300-30 MG tablet Take 1 tablet by mouth every 8 (eight) hours as needed for moderate pain.   amphetamine-dextroamphetamine (ADDERALL XR) 20 MG 24 hr capsule Take 20 mg by mouth daily. Takes 2 tablets (40 mg) every morning   amphetamine-dextroamphetamine (ADDERALL) 30 MG tablet Take by mouth.   buPROPion (WELLBUTRIN XL) 150 MG 24 hr tablet Take 1 tablet by mouth every morning.   Cholecalciferol 50 MCG (2000 UT) CAPS 1 capsule every day by oral route.   famotidine (PEPCID) 20 MG tablet Take by mouth.   fenofibrate (TRICOR) 145 MG tablet TAKE 1 TABLET BY MOUTH EVERY DAY   furosemide (LASIX) 20 MG tablet Take 1 tablet (20 mg total) by mouth daily. With extra prn weight gain   Glucosamine-Vitamin D 1000-200 MG-UNIT TABS Take 2 tablets by mouth daily.   liothyronine (CYTOMEL) 5 MCG tablet Take 2 tablets by mouth daily.   losartan (COZAAR) 100 MG tablet    Multiple Vitamin (MULTIVITAMIN) tablet Take 1 tablet by mouth daily.   rosuvastatin (CRESTOR) 10 MG tablet Take 10 mg by mouth at bedtime.   sertraline (ZOLOFT) 50 MG tablet Take 50 mg by mouth daily.   sildenafil (VIAGRA) 100 MG tablet Take 1 tablet (100 mg total) by mouth daily as needed for erectile dysfunction.   sotalol (BETAPACE) 80 MG tablet Take 80 mg by mouth 2 (two) times daily.   warfarin (  COUMADIN) 5 MG tablet Take 5 mg by mouth as directed.    warfarin (COUMADIN) 7.5 MG tablet SMARTSIG:0.5-1 Tablet(s) By Mouth As Directed   amoxicillin (AMOXIL) 500 MG tablet Take 500 mg by mouth as directed. Takes 4 tablets 1 hour prior to dental procedures (Patient not taking: Reported on 01/29/2023)   diphenhydrAMINE (BENADRYL) 25 mg capsule Take by mouth. (Patient not taking: Reported on 01/29/2023)   guaiFENesin-codeine (ROBITUSSIN AC) 100-10 MG/5ML syrup Take 5 mLs by mouth 3 (three) times daily as needed for cough. (Patient not taking: Reported on 01/29/2023)    influenza vac split quadrivalent PF (FLUARIX) 0.5 ML injection TO BE ADMINISTERED BY PHARMACIST FOR IMMUNIZATION (Patient not taking: Reported on 01/29/2023)   meclizine (ANTIVERT) 25 MG tablet Take by mouth. (Patient not taking: Reported on 01/29/2023)   No facility-administered encounter medications on file as of 01/29/2023.    Allergies (verified) Nicardipine hcl, Nitroglycerin, Calcium channel blockers, Molds & smuts, Shellfish allergy, and Tape   History: Past Medical History:  Diagnosis Date   ADD (attention deficit disorder)    sees psych in [place], [place] (meds from there)   Allergic rhinitis    Ragweed, mold, mildew   Anxiety associated with depression    sees psych in [place], [place] (meds from there)   Arrhythmia    Claustrophobia    Colon polyp 04/2009   rectal tubular adenoma, rec rpt 3 yrs   CVA (cerebral infarction) 08/2013   L thalamic lacunar infarct post CVTS surgery, s/p neuropsychology testing, completed speech therapy. no ASA 2/2 no CAD hx   ED (erectile dysfunction)    H/O mitral valve replacement 08/2013   St Jude, needs abx ppx, completed cardiac rehab 12/2013   History of asthma    as child   HLD (hyperlipidemia)    HTN (hypertension)    Hypertrophic obstructive cardiomyopathy(425.11)    Cards (Dr. Marlis Edelson [facility] - need abx prophylaxis - now established with [facility] Dr. Elmer Picker [phone]) and Dr. Celine Mans and Mason Jim s/p myomectomy, now cardiac rehab [facility] 09/2013   LBBB (left bundle branch block) 08/2013   after myomectomy   Migraine with aura    Need for prophylactic antibiotic    Obesity    saw nutritionist 12/18/2014   OSA (obstructive sleep apnea)    CPAP at night, up to 16 mmHg (Dr Margo Aye at [facility])   Paroxysmal atrial fibrillation (HCC) 04/2013, 05/2013   with RVR; s/p multiple hospitalizations, spontaneous conversion, referred to Dr. Sherryll Burger EP, then recurrence - failed sotalol, multaq (both with spont conversions)   Transient alteration of  awareness 05/2014   during hospitalization pending EEG [facility])   Vitamin D deficiency    Warfarin anticoagulation    goal INR 2.5-3.5   Past Surgical History:  Procedure Laterality Date   CARDIAC CATHETERIZATION  2014   done for chest pain, no plaque buildup   COLONOSCOPY  04/2009   rectal tubular adenoma x1, rpt 3 yrs   COLONOSCOPY  04/2014   mild diverticulosis, no polyps, rec rpt 5 yrs [facility])   EYE SURGERY  1969; 1971   EYE SURGERY Right 04/2015   for PVD   hospitalization  04/2014   subtherapeutic INR, heparin bridge   INGUINAL HERNIA REPAIR  1987   KNEE ARTHROSCOPY  2006; 2007   right   MITRAL VALVE REPLACEMENT  08/2013   St Jude MV for severe central mitral regurg   MYOMECTOMY  08/22/2013   with MAZE procedure [facility] (  Drs Loleta Chance)   PFTs  07/2013   FVC 79%, FEV1 72%, ratio 0.71 - mild obstruction, o/w normal   VASECTOMY  1992   WISDOM TOOTH EXTRACTION  1977   Family History  Problem Relation Age of Onset   Alcohol abuse Brother    Cancer Mother 53       breast   Ulcers Mother    Heart disease Mother        HOCM   Psoriasis Father    Cancer Maternal Grandfather        colon   Coronary artery disease Paternal Grandfather 78       MI   Diabetes Neg Hx    Stroke Neg Hx    Social History   Socioeconomic History   Marital status: Married    Spouse name: Not on file   Number of children: Not on file   Years of education: Not on file   Highest education level: Not on file  Occupational History   Not on file  Tobacco Use   Smoking status: Never   Smokeless tobacco: Never  Substance and Sexual Activity   Alcohol use: Yes    Comment: rarely   Drug use: No   Sexual activity: Not on file  Other Topics Concern   Not on file  Social History Narrative   Caffeine: quart of soda/day   Lives with wife ([name]), 3 cats. No children.   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   Edu: Bachelor's degree   Activity: walking several  times a week   Diet: good water, fruits/vegetables daily       Cards: Dr Marlis Edelson [facility] (HOCM) and Dr Cherene Altes (EP), as well as [facility] Dr Deneise Lever   Social Determinants of Health   Financial Resource Strain: Low Risk  (01/29/2023)   Overall Financial Resource Strain (CARDIA)    Difficulty of Paying Living Expenses: Not hard at all  Food Insecurity: No Food Insecurity (01/29/2023)   Hunger Vital Sign    Worried About Running Out of Food in the Last Year: Never true    Ran Out of Food in the Last Year: Never true  Transportation Needs: No Transportation Needs (01/29/2023)   PRAPARE - Transportation    Lack of Transportation (Medical): No    Lack of Transportation (Non-Medical): No  Physical Activity: Inactive (01/29/2023)   Exercise Vital Sign    Days of Exercise per Week: 0 days    Minutes of Exercise per Session: 0 min  Stress: No Stress Concern Present (01/29/2023)   [employer] of Occupational Health - Occupational Stress Questionnaire    Feeling of Stress : Not at all  Social Connections: Socially Integrated (01/29/2023)   Social Connection and Isolation Panel [NHANES]    Frequency of Communication with Friends and Family: Three times a week    Frequency of Social Gatherings with Friends and Family: Three times a week    Attends Religious Services: More than 4 times per year    Active Member of Clubs or Organizations: Yes    Attends Engineer, structural: More than 4 times per year    Marital Status: Married    Tobacco Counseling Counseling given: Not Answered   Clinical Intake:  Pre-visit preparation completed: Yes  Pain : No/denies pain     BMI - recorded: 35.83 Nutritional Status: BMI > 30  Obese Nutritional Risks: None Diabetes: No  How often do you need to have someone help  you when you read instructions, pamphlets, or other written materials from your doctor or pharmacy?: 5 - Always  Interpreter Needed?: No      Activities of  Daily Living    01/29/2023    9:21 AM  In your present state of health, do you have any difficulty performing the following activities:  Hearing? 0  Vision? 0  Difficulty concentrating or making decisions? 1  Comment S/P stroke that has affected short term memory  Walking or climbing stairs? 0  Dressing or bathing? 0  Doing errands, shopping? 1  Comment Wife assists  Preparing Food and eating ? N  Using the Toilet? N  In the past six months, have you accidently leaked urine? N  Do you have problems with loss of bowel control? N  Managing your Medications? Y  Comment wife assists  Managing your Finances? Y  Housekeeping or managing your Housekeeping? Y    Patient Care Team: Eustaquio Boyden, MD as PCP - General (Family Medicine) Mickey Farber, MD (Cardiology) Nonnie Done, MD (Inactive) (Cardiology) Kathyrn Sheriff, [facility] (Inactive) as Pharmacist (Pharmacist)  Indicate any recent Medical Services you may have received from other than [facility] providers in the past year (date may be approximate).     Assessment:   This is a routine wellness examination for Nehorai.  Hearing/Vision screen Hearing Screening - Comments:: Denies hearing difficulties   Vision Screening - Comments:: Glasses - [facility] - UTD on eye exams  Dietary issues and exercise activities discussed:     Goals Addressed             This Visit's Progress    Patient Stated       Exercise more as tolerated.       Depression Screen    01/29/2023   10:37 AM 08/02/2022   10:12 AM 01/26/2022    9:20 AM 01/19/2021   11:45 AM 11/10/2019    3:46 PM 06/24/2018   10:42 AM 06/24/2018    9:59 AM  PHQ 2/9 Scores  PHQ - 2 Score 0 2 1 0 0 0 0  PHQ- 9 Score  6 1 10  0    Exception Documentation       Patient refusal    Fall Risk    01/29/2023    9:21 AM 08/02/2022   10:12 AM 01/26/2022    9:24 AM 01/26/2022    9:02 AM 01/19/2021   11:12 AM  Fall Risk   Falls in the past year? 1 1 1 1 1   Number falls in past  yr: 1 1 1 1  0  Injury with Fall? 0  1 1 0  Risk for fall due to : Impaired balance/gait  History of fall(s)    Risk for fall due to: Comment Meniere's      Follow up   Falls evaluation completed;Falls prevention discussed      MEDICARE RISK AT HOME:   TIMED UP AND GO:  Was the test performed?  No    Cognitive Function:    11/10/2019    3:57 PM  MMSE - Mini Mental State Exam  Not completed: Unable to complete        01/29/2023   10:42 AM  6CIT Screen  What Year? 0 points  What month? 0 points  What time? 0 points  Count back from 20 0 points  Months in reverse 0 points  Repeat phrase 8 points  Total Score 8 points    Immunizations Immunization History  Administered Date(s) Administered   COVID-19, mRNA, vaccine(Comirnaty)12 years and older 03/16/2022   Influenza, High Dose Seasonal PF 03/16/2022   Influenza,inj,Quad PF,6+ Mos 03/19/2015, 03/17/2016, 03/19/2018, 04/04/2019, 04/28/2020, 03/30/2021   Influenza,inj,quad, With Preservative 03/19/2016   Influenza-Unspecified 03/19/2014, 03/16/2017   Moderna Covid-19 Vaccine Bivalent Booster 45yrs & up 03/17/2021   Moderna Sars-Covid-2 Vaccination 07/15/2019, 09/22/2019   PNEUMOCOCCAL CONJUGATE-20 09/08/2021   Pneumococcal Polysaccharide-23 03/19/2015   Tdap 02/06/2013, 11/29/2019   Zoster Recombinant(Shingrix) 09/01/2021, 01/20/2022    TDAP status: Up to date  Flu Vaccine status: Due, Education has been provided regarding the importance of this vaccine. Advised may receive this vaccine at local pharmacy or Health Dept. Aware to provide a copy of the vaccination record if obtained from local pharmacy or Health Dept. Verbalized acceptance and understanding.  Pneumococcal vaccine status: Up to date  Covid-19 vaccine status: Information provided on how to obtain vaccines.   Qualifies for Shingles Vaccine? Yes   Zostavax completed No   Shingrix Completed?: Yes  Screening Tests Health Maintenance  Topic Date Due    HIV Screening  Never done   COVID-19 Vaccine (5 - 2023-24 season) 05/11/2022   INFLUENZA VACCINE  01/18/2023   Medicare Annual Wellness (AWV)  01/29/2024   Fecal DNA (Cologuard)  08/12/2024   DTaP/Tdap/Td (3 - Td or Tdap) 11/28/2029   Hepatitis C Screening  Completed   Zoster Vaccines- Shingrix  Completed   HPV VACCINES  Aged Out   Colonoscopy  Discontinued    Health Maintenance  Health Maintenance Due  Topic Date Due   HIV Screening  Never done   COVID-19 Vaccine (5 - 2023-24 season) 05/11/2022   INFLUENZA VACCINE  01/18/2023    Colorectal cancer screening: Type of screening: Cologuard. Completed 08/12/21. Repeat every 3 years  Lung Cancer Screening: (Low Dose CT Chest recommended if Age 59-80 years, 20 pack-year currently smoking OR have quit w/in 15years.) does not qualify.   Lung Cancer Screening Referral: no  Additional Screening:  Hepatitis C Screening: does qualify; Completed 06/09/16  Vision Screening: Recommended annual ophthalmology exams for early detection of glaucoma and other disorders of the eye. Is the patient up to date with their annual eye exam?  Yes  Who is the provider or what is the name of the office in which the patient attends annual eye exams? [facility] If pt is not established with a provider, would they like to be referred to a provider to establish care? Yes .   Dental Screening: Recommended annual dental exams for proper oral hygiene    Community Resource Referral / Chronic Care Management: CRR required this visit?  No   CCM required this visit?  No     Plan:     I have personally reviewed and noted the following in the patient's chart:   Medical and social history Use of alcohol, tobacco or illicit drugs  Current medications and supplements including opioid prescriptions. Patient is currently taking opioid prescriptions. Information provided to patient regarding non-opioid alternatives. Patient advised to discuss non-opioid  treatment plan with their provider. Functional ability and status Nutritional status Physical activity Advanced directives List of other physicians Hospitalizations, surgeries, and ER visits in previous 12 months Vitals Screenings to include cognitive, depression, and falls Referrals and appointments  In addition, I have reviewed and discussed with patient certain preventive protocols, quality metrics, and best practice recommendations. A written personalized care plan for preventive services as well as general preventive health recommendations were provided to patient.  Maryan Puls, LPN   1/61/0960   After Visit Summary: (MyChart) Due to this being a telephonic visit, the after visit summary with patients personalized plan was offered to patient via MyChart   Nurse Notes: None

## 2023-01-29 NOTE — Patient Instructions (Addendum)
Dylan Fletcher , Thank you for taking time to come for your Medicare Wellness Visit. I appreciate your ongoing commitment to your health goals. Please review the following plan we discussed and let me know if I can assist you in the future.   Referrals/Orders/Follow-Ups/Clinician Recommendations: Aim for 30 minutes of exercise or brisk walking, 6-8 glasses of water, and 5 servings of fruits and vegetables each day.   Managing Pain Without Opioids Opioids are strong medicines used to treat moderate to severe pain. For some people, especially those who have long-term (chronic) pain, opioids may not be the best choice for pain management due to: Side effects like nausea, constipation, and sleepiness. The risk of addiction (opioid use disorder). The longer you take opioids, the greater your risk of addiction. Pain that lasts for more than 3 months is called chronic pain. Managing chronic pain usually requires more than one approach and is often provided by a team of health care providers working together (multidisciplinary approach). Pain management may be done at a pain management center or pain clinic. How to manage pain without the use of opioids Use non-opioid medicines Non-opioid medicines for pain may include: Over-the-counter or prescription non-steroidal anti-inflammatory drugs (NSAIDs). These may be the first medicines used for pain. They work well for muscle and bone pain, and they reduce swelling. Acetaminophen. This over-the-counter medicine may work well for milder pain but not swelling. Antidepressants. These may be used to treat chronic pain. A certain type of antidepressant (tricyclics) is often used. These medicines are given in lower doses for pain than when used for depression. Anticonvulsants. These are usually used to treat seizures but may also reduce nerve (neuropathic) pain. Muscle relaxants. These relieve pain caused by sudden muscle tightening (spasms). You may also use a  pain medicine that is applied to the skin as a patch, cream, or gel (topical analgesic), such as a numbing medicine. These may cause fewer side effects than medicines taken by mouth. Do certain therapies as directed Some therapies can help with pain management. They include: Physical therapy. You will do exercises to gain strength and flexibility. A physical therapist may teach you exercises to move and stretch parts of your body that are weak, stiff, or painful. You can learn these exercises at physical therapy visits and practice them at home. Physical therapy may also involve: Massage. Heat wraps or applying heat or cold to affected areas. Electrical signals that interrupt pain signals (transcutaneous electrical nerve stimulation, TENS). Weak lasers that reduce pain and swelling (low-level laser therapy). Signals from your body that help you learn to regulate pain (biofeedback). Occupational therapy. This helps you to learn ways to function at home and work with less pain. Recreational therapy. This involves trying new activities or hobbies, such as a physical activity or drawing. Mental health therapy, including: Cognitive behavioral therapy (CBT). This helps you learn coping skills for dealing with pain. Acceptance and commitment therapy (ACT) to change the way you think and react to pain. Relaxation therapies, including muscle relaxation exercises and mindfulness-based stress reduction. Pain management counseling. This may be individual, family, or group counseling.  Receive medical treatments Medical treatments for pain management include: Nerve block injections. These may include a pain blocker and anti-inflammatory medicines. You may have injections: Near the spine to relieve chronic back or neck pain. Into joints to relieve back or joint pain. Into nerve areas that supply a painful area to relieve body pain. Into muscles (trigger point injections) to relieve some painful muscle  conditions. A medical device placed near your spine to help block pain signals and relieve nerve pain or chronic back pain (spinal cord stimulation device). Acupuncture. Follow these instructions at home Medicines Take over-the-counter and prescription medicines only as told by your health care provider. If you are taking pain medicine, ask your health care providers about possible side effects to watch out for. Do not drive or use heavy machinery while taking prescription opioid pain medicine. Lifestyle  Do not use drugs or alcohol to reduce pain. If you drink alcohol, limit how much you have to: 0-1 drink a day for women who are not pregnant. 0-2 drinks a day for men. Know how much alcohol is in a drink. In the [place] one drink equals one 12 oz bottle of beer (355 mL), one 5 oz glass of wine (148 mL), or one 1 oz glass of hard liquor (44 mL). Do not use any products that contain nicotine or tobacco. These products include cigarettes, chewing tobacco, and vaping devices, such as e-cigarettes. If you need help quitting, ask your health care provider. Eat a healthy diet and maintain a healthy weight. Poor diet and excess weight may make pain worse. Eat foods that are high in fiber. These include fresh fruits and vegetables, whole grains, and beans. Limit foods that are high in fat and processed sugars, such as fried and sweet foods. Exercise regularly. Exercise lowers stress and may help relieve pain. Ask your health care provider what activities and exercises are safe for you. If your health care provider approves, join an exercise class that combines movement and stress reduction. Examples include yoga and tai chi. Get enough sleep. Lack of sleep may make pain worse. Lower stress as much as possible. Practice stress reduction techniques as told by your therapist. General instructions Work with all your pain management providers to find the treatments that work best for you. You are an  important member of your pain management team. There are many things you can do to reduce pain on your own. Consider joining an online or in-person support group for people who have chronic pain. Keep all follow-up visits. This is important. Where to find more information You can find more information about managing pain without opioids from: American Academy of Pain Medicine: painmed.org Institute for Chronic Pain: instituteforchronicpain.org American Chronic Pain Association: theacpa.org Contact a health care provider if: You have side effects from pain medicine. Your pain gets worse or does not get better with treatments or home therapy. You are struggling with anxiety or depression. Summary Many types of pain can be managed without opioids. Chronic pain may respond better to pain management without opioids. Pain is best managed when you and a team of health care providers work together. Pain management without opioids may include non-opioid medicines, medical treatments, physical therapy, mental health therapy, and lifestyle changes. Tell your health care providers if your pain gets worse or is not being managed well enough. This information is not intended to replace advice given to you by your health care provider. Make sure you discuss any questions you have with your health care provider. Document Revised: 09/15/2020 Document Reviewed: 09/15/2020 Elsevier Patient Education  2024 [employer].   This is a list of the screening recommended for you and due dates:  Health Maintenance  Topic Date Due   HIV Screening  Never done   COVID-19 Vaccine (5 - 2023-24 season) 05/11/2022   Medicare Annual Wellness Visit  01/27/2023   Flu Shot  01/18/2023  Cologuard (Stool DNA test)  08/12/2024   DTaP/Tdap/Td vaccine (3 - Td or Tdap) 11/28/2029   Hepatitis C Screening  Completed   Zoster (Shingles) Vaccine  Completed   HPV Vaccine  Aged Out   Colon Cancer Screening  Discontinued     Advanced directives: (In Chart) A copy of your advanced directives are scanned into your chart should your provider ever need it.  Next Medicare Annual Wellness Visit scheduled for next year: Yes  Preventive Care 40-64 Years, Male Preventive care refers to lifestyle choices and visits with your health care provider that can promote health and wellness. What does preventive care include? A yearly physical exam. This is also called an annual well check. Dental exams once or twice a year. Routine eye exams. Ask your health care provider how often you should have your eyes checked. Personal lifestyle choices, including: Daily care of your teeth and gums. Regular physical activity. Eating a healthy diet. Avoiding tobacco and drug use. Limiting alcohol use. Practicing safe sex. Taking low-dose aspirin every day starting at age 57. What happens during an annual well check? The services and screenings done by your health care provider during your annual well check will depend on your age, overall health, lifestyle risk factors, and family history of disease. Counseling  Your health care provider may ask you questions about your: Alcohol use. Tobacco use. Drug use. Emotional well-being. Home and relationship well-being. Sexual activity. Eating habits. Work and work Astron[employer]. Screening  You may have the following tests or measurements: Height, weight, and BMI. Blood pressure. Lipid and cholesterol levels. These may be checked every 5 years, or more frequently if you are over 61 years old. Skin check. Lung cancer screening. You may have this screening every year starting at age 62 if you have a 30-pack-year history of smoking and currently smoke or have quit within the past 15 years. Fecal occult blood test (FOBT) of the stool. You may have this test every year starting at age 61. Flexible sigmoidoscopy or colonoscopy. You may have a sigmoidoscopy every 5 years or a colonoscopy  every 10 years starting at age 35. Prostate cancer screening. Recommendations will vary depending on your family history and other risks. Hepatitis C blood test. Hepatitis B blood test. Sexually transmitted disease (STD) testing. Diabetes screening. This is done by checking your blood sugar (glucose) after you have not eaten for a while (fasting). You may have this done every 1-3 years. Discuss your test results, treatment options, and if necessary, the need for more tests with your health care provider. Vaccines  Your health care provider may recommend certain vaccines, such as: Influenza vaccine. This is recommended every year. Tetanus, diphtheria, and acellular pertussis (Tdap, Td) vaccine. You may need a Td booster every 10 years. Zoster vaccine. You may need this after age 18. Pneumococcal 13-valent conjugate (PCV13) vaccine. You may need this if you have certain conditions and have not been vaccinated. Pneumococcal polysaccharide (PPSV23) vaccine. You may need one or two doses if you smoke cigarettes or if you have certain conditions. Talk to your health care provider about which screenings and vaccines you need and how often you need them. This information is not intended to replace advice given to you by your health care provider. Make sure you discuss any questions you have with your health care provider. Document Released: 07/02/2015 Document Revised: 02/23/2016 Document Reviewed: 04/06/2015 Elsevier Interactive Patient Education  2017 [employer].  Fall Prevention in the Home Falls can cause injuries.  They can happen to people of all ages. There are many things you can do to make your home safe and to help prevent falls. What can I do on the outside of my home? Regularly fix the edges of walkways and driveways and fix any cracks. Remove anything that might make you trip as you walk through a door, such as a raised step or threshold. Trim any bushes or trees on the path to your  home. Use bright outdoor lighting. Clear any walking paths of anything that might make someone trip, such as rocks or tools. Regularly check to see if handrails are loose or broken. Make sure that both sides of any steps have handrails. Any raised decks and porches should have guardrails on the edges. Have any leaves, snow, or ice cleared regularly. Use sand or salt on walking paths during winter. Clean up any spills in your garage right away. This includes oil or grease spills. What can I do in the bathroom? Use night lights. Install grab bars by the toilet and in the tub and shower. Do not use towel bars as grab bars. Use non-skid mats or decals in the tub or shower. If you need to sit down in the shower, use a plastic, non-slip stool. Keep the floor dry. Clean up any water that spills on the floor as soon as it happens. Remove soap buildup in the tub or shower regularly. Attach bath mats securely with double-sided non-slip rug tape. Do not have throw rugs and other things on the floor that can make you trip. What can I do in the bedroom? Use night lights. Make sure that you have a light by your bed that is easy to reach. Do not use any sheets or blankets that are too big for your bed. They should not hang down onto the floor. Have a firm chair that has side arms. You can use this for support while you get dressed. Do not have throw rugs and other things on the floor that can make you trip. What can I do in the kitchen? Clean up any spills right away. Avoid walking on wet floors. Keep items that you use a lot in easy-to-reach places. If you need to reach something above you, use a strong step stool that has a grab bar. Keep electrical cords out of the way. Do not use floor polish or wax that makes floors slippery. If you must use wax, use non-skid floor wax. Do not have throw rugs and other things on the floor that can make you trip. What can I do with my stairs? Do not leave any  items on the stairs. Make sure that there are handrails on both sides of the stairs and use them. Fix handrails that are broken or loose. Make sure that handrails are as long as the stairways. Check any carpeting to make sure that it is firmly attached to the stairs. Fix any carpet that is loose or worn. Avoid having throw rugs at the top or bottom of the stairs. If you do have throw rugs, attach them to the floor with carpet tape. Make sure that you have a light switch at the top of the stairs and the bottom of the stairs. If you do not have them, ask someone to add them for you. What else can I do to help prevent falls? Wear shoes that: Do not have high heels. Have rubber bottoms. Are comfortable and fit you well. Are closed at the toe. Do not  wear sandals. If you use a stepladder: Make sure that it is fully opened. Do not climb a closed stepladder. Make sure that both sides of the stepladder are locked into place. Ask someone to hold it for you, if possible. Clearly mark and make sure that you can see: Any grab bars or handrails. First and last steps. Where the edge of each step is. Use tools that help you move around (mobility aids) if they are needed. These include: Canes. Walkers. Scooters. Crutches. Turn on the lights when you go into a dark area. Replace any light bulbs as soon as they burn out. Set up your furniture so you have a clear path. Avoid moving your furniture around. If any of your floors are uneven, fix them. If there are any pets around you, be aware of where they are. Review your medicines with your doctor. Some medicines can make you feel dizzy. This can increase your chance of falling. Ask your doctor what other things that you can do to help prevent falls. This information is not intended to replace advice given to you by your health care provider. Make sure you discuss any questions you have with your health care provider. Document Released: 04/01/2009  Document Revised: 11/11/2015 Document Reviewed: 07/10/2014 Elsevier Interactive Patient Education  2017 [employer].

## 2023-01-30 DIAGNOSIS — E291 Testicular hypofunction: Secondary | ICD-10-CM | POA: Diagnosis not present

## 2023-01-30 DIAGNOSIS — R5383 Other fatigue: Secondary | ICD-10-CM | POA: Diagnosis not present

## 2023-01-31 ENCOUNTER — Encounter: Payer: Self-pay | Admitting: Family Medicine

## 2023-01-31 ENCOUNTER — Ambulatory Visit (INDEPENDENT_AMBULATORY_CARE_PROVIDER_SITE_OTHER): Payer: Medicare Other | Admitting: Family Medicine

## 2023-01-31 VITALS — BP 134/72 | HR 70 | Temp 97.3°F | Ht 65.25 in | Wt 220.0 lb

## 2023-01-31 DIAGNOSIS — E291 Testicular hypofunction: Secondary | ICD-10-CM

## 2023-01-31 DIAGNOSIS — I6930 Unspecified sequelae of cerebral infarction: Secondary | ICD-10-CM

## 2023-01-31 DIAGNOSIS — I10 Essential (primary) hypertension: Secondary | ICD-10-CM

## 2023-01-31 DIAGNOSIS — N1831 Chronic kidney disease, stage 3a: Secondary | ICD-10-CM | POA: Diagnosis not present

## 2023-01-31 DIAGNOSIS — G43109 Migraine with aura, not intractable, without status migrainosus: Secondary | ICD-10-CM | POA: Diagnosis not present

## 2023-01-31 DIAGNOSIS — I421 Obstructive hypertrophic cardiomyopathy: Secondary | ICD-10-CM | POA: Diagnosis not present

## 2023-01-31 DIAGNOSIS — I69319 Unspecified symptoms and signs involving cognitive functions following cerebral infarction: Secondary | ICD-10-CM | POA: Diagnosis not present

## 2023-01-31 DIAGNOSIS — F909 Attention-deficit hyperactivity disorder, unspecified type: Secondary | ICD-10-CM

## 2023-01-31 DIAGNOSIS — E781 Pure hyperglyceridemia: Secondary | ICD-10-CM | POA: Diagnosis not present

## 2023-01-31 DIAGNOSIS — G4733 Obstructive sleep apnea (adult) (pediatric): Secondary | ICD-10-CM

## 2023-01-31 DIAGNOSIS — Z7901 Long term (current) use of anticoagulants: Secondary | ICD-10-CM

## 2023-01-31 DIAGNOSIS — I69320 Aphasia following cerebral infarction: Secondary | ICD-10-CM

## 2023-01-31 DIAGNOSIS — K760 Fatty (change of) liver, not elsewhere classified: Secondary | ICD-10-CM

## 2023-01-31 DIAGNOSIS — E559 Vitamin D deficiency, unspecified: Secondary | ICD-10-CM | POA: Diagnosis not present

## 2023-01-31 DIAGNOSIS — Z952 Presence of prosthetic heart valve: Secondary | ICD-10-CM

## 2023-01-31 DIAGNOSIS — E785 Hyperlipidemia, unspecified: Secondary | ICD-10-CM

## 2023-01-31 DIAGNOSIS — I484 Atypical atrial flutter: Secondary | ICD-10-CM | POA: Diagnosis not present

## 2023-01-31 DIAGNOSIS — H8102 Meniere's disease, left ear: Secondary | ICD-10-CM

## 2023-01-31 DIAGNOSIS — Z8679 Personal history of other diseases of the circulatory system: Secondary | ICD-10-CM

## 2023-01-31 DIAGNOSIS — I480 Paroxysmal atrial fibrillation: Secondary | ICD-10-CM

## 2023-01-31 DIAGNOSIS — F418 Other specified anxiety disorders: Secondary | ICD-10-CM

## 2023-01-31 DIAGNOSIS — D472 Monoclonal gammopathy: Secondary | ICD-10-CM

## 2023-01-31 DIAGNOSIS — F331 Major depressive disorder, recurrent, moderate: Secondary | ICD-10-CM

## 2023-01-31 DIAGNOSIS — Z7189 Other specified counseling: Secondary | ICD-10-CM

## 2023-01-31 DIAGNOSIS — Z9889 Other specified postprocedural states: Secondary | ICD-10-CM

## 2023-01-31 DIAGNOSIS — R5382 Chronic fatigue, unspecified: Secondary | ICD-10-CM

## 2023-01-31 MED ORDER — FENOFIBRATE 145 MG PO TABS
145.0000 mg | ORAL_TABLET | Freq: Every day | ORAL | 4 refills | Status: DC
Start: 2023-01-31 — End: 2024-04-08

## 2023-01-31 NOTE — Progress Notes (Unsigned)
Ph: [phone] Fax: [phone]   Patient ID: Dylan Fletcher, male    DOB: 09/29/1960, 62 y.o.   MRN: [id number]  This visit was conducted in person.  BP 134/72   Pulse 70   Temp (!) 97.3 F (36.3 C) (Temporal)   Ht 5' 5.25" (1.657 m)   Wt 220 lb (99.8 kg)   SpO2 99%   BMI 36.33 kg/m    CC: AMW f/u visit  Subjective:   HPI: Dylan Fletcher is a 62 y.o. male presenting on 01/31/2023 for Annual Exam (MCR prt 2 [AWV- 01/29/23]. Pt accompanied by wife, Dylan Fletcher. )   Saw health advisor earlier this week for medicare wellness visit. Note reviewed.  Failed cognitive assessment:    01/29/2023   10:42 AM  6CIT Screen  What Year? 0 points  What month? 0 points  What time? 0 points  Count back from 20 0 points  Months in reverse 0 points  Repeat phrase 8 points  Total Score 8 points   No results found.  Flowsheet Row Clinical Support from 01/29/2023 in [facility]  PHQ-2 Total Score 0          01/29/2023    9:21 AM 08/02/2022   10:12 AM 01/26/2022    9:24 AM 01/26/2022    9:02 AM 01/19/2021   11:12 AM  Fall Risk   Falls in the past year? 1 1 1 1 1   Number falls in past yr: 1 1 1 1  0  Injury with Fall? 0  1 1 0  Risk for fall due to : Impaired balance/gait  History of fall(s)    Risk for fall due to: Comment Meniere's      Follow up   Falls evaluation completed;Falls prevention discussed      Known HOCM, LBBB, Pafib, s/p ventricular septal myectomy and MVR followed by [facility] cardiology. Had nuclear treadmill stress test and echocardiogram 03/2021 - no ischemia, EF 60%, mild-mod dilated LA.  L thalamic stroke 09/2013 after MAZE myomectomy and MVR for HOCM and afib. [facility] cardiology (Dr Dylan Fletcher).  Atypical atrial flutter  managed with sotalol - possibly causing fatigue. Discussing ablation end of Sept with Dr Dylan Fletcher.   Monoclonal B-cell gammopathy vs early CLL - seeing Dylan Fletcher hematology yearly Dr Dylan Fletcher, last seen 12/08/2022.   L ear  meniere's managed with low sodium diet, followed by ENT Dr Dylan Fletcher. H/o BPPV as well. Limits sodium to 1000-1200mg /day. Notes occasional vertigo at night in bed.   Hypogonadism seeing endo Dr Dylan Fletcher last yesterday - recent T low - planned recheck and if persistently low to retrial clomid - will need to keep an eye on triglycerides.    OSA continues auto-CPAP use.   HLD - followed by lipid clinic on fenofibrate 145mg  and rosuvastatin 10mg  daily.   Depression - doesn't receive light therapy in summer months. He does go outdoors and enjoys disc golf. No longer sees therapist.   Goal INR 2.5-3.5 - he tests at home.   Preventative: COLONOSCOPY Date: 04/2014 mild diverticulosis, no polyps, rec rpt 5 yrs [facility]) --> Cologuard negative 07/2021  Prostate cancer screening - yearly PSA with intermittent DRE Lung cancer screening - not eligible  Flu yearly  COVID vaccine - Moderna 06/2019, 09/2019, booster x2 latest 02/2022 Tdap 01/2013, 11/2019  Pneumovax 2016  Shingrix - 08/2021, 01/2022 RSV - discussed, to check at pharmacy  Advanced directive planning: does not have set up. Would want wife to  be HCPOA. Packet previously provided.  Seat belt use discussed  Sunscreen use discussed. No changing moles on skin.  Non smoker  Alcohol - seldom  Dentist q4 mo  Eye exam yearly [facility] Dr Dylan Fletcher) Bowel - no constipation Bladder - urge incontinence - uses urinal bag for driving long distances.    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 Interim medical history since our last visit reviewed. Allergies and medications reviewed and updated. Outpatient Medications Prior to Visit  Medication Sig Dispense Refill    acetaminophen (TYLENOL) 500 MG tablet Take 1 tablet (500 mg total) by mouth 2 (two) times daily.     acetaminophen-codeine (TYLENOL #3) 300-30 MG tablet Take 1 tablet by mouth every 8 (eight) hours as needed for moderate pain. 15 tablet 0   amoxicillin (AMOXIL) 500 MG tablet Take 500 mg by mouth as directed. Takes 4 tablets 1 hour prior to dental procedures     amphetamine-dextroamphetamine (ADDERALL XR) 20 MG 24 hr capsule Take 20 mg by mouth daily. Takes 2 tablets (40 mg) every morning     amphetamine-dextroamphetamine (ADDERALL) 30 MG tablet Take by mouth.     buPROPion (WELLBUTRIN XL) 150 MG 24 hr tablet Take 1 tablet by mouth every morning.     Cholecalciferol 50 MCG (2000 UT) CAPS 1 capsule every day by oral route.     diphenhydrAMINE (BENADRYL) 25 mg capsule Take by mouth.     famotidine (PEPCID) 20 MG tablet Take by mouth.     furosemide (LASIX) 20 MG tablet Take 1 tablet (20 mg total) by mouth daily. With extra prn weight gain     Glucosamine-Vitamin D 1000-200 MG-UNIT TABS Take 2 tablets by mouth daily.     liothyronine (CYTOMEL) 5 MCG tablet Take 2 tablets by mouth daily.     losartan (COZAAR) 100 MG tablet      meclizine (ANTIVERT) 25 MG tablet Take by mouth.     Multiple Vitamin (MULTIVITAMIN) tablet Take 1 tablet by mouth daily.     rosuvastatin (CRESTOR) 10 MG tablet Take 10 mg by mouth at bedtime.     sertraline (ZOLOFT) 50 MG tablet Take 50 mg by mouth daily.     sildenafil (VIAGRA) 100 MG tablet Take 1 tablet (100 mg total) by mouth daily as needed for erectile dysfunction. 10 tablet 3   sotalol (BETAPACE) 80 MG tablet Take 80 mg by mouth 2 (two) times daily.     warfarin (COUMADIN) 5 MG tablet Take 5 mg by mouth as directed.      warfarin (COUMADIN) 7.5 MG tablet SMARTSIG:0.5-1 Tablet(s) By Mouth As Directed     fenofibrate (TRICOR) 145 MG tablet TAKE 1 TABLET BY MOUTH EVERY DAY 90 tablet 1   guaiFENesin-codeine (ROBITUSSIN AC) 100-10 MG/5ML syrup Take 5 mLs by mouth 3  (three) times daily as needed for cough. 120 mL 0   influenza vac split quadrivalent PF (FLUARIX) 0.5 ML injection      No facility-administered medications prior to visit.     Per HPI unless specifically indicated in ROS section below Review of Systems  Objective:  BP 134/72   Pulse 70   Temp (!) 97.3 F (36.3 C) (Temporal)   Ht 5' 5.25" (  1.657 m)   Wt 220 lb (99.8 kg)   SpO2 99%   BMI 36.33 kg/m   Wt Readings from Last 3 Encounters:  01/31/23 220 lb (99.8 kg)  01/29/23 222 lb (100.7 kg)  08/02/22 216 lb 2 oz (98 kg)      Physical Exam Vitals and nursing note reviewed.  Constitutional:      General: He is not in acute distress.    Appearance: Normal appearance. He is well-developed. He is not ill-appearing.  HENT:     Head: Normocephalic and atraumatic.     Right Ear: Hearing, tympanic membrane, ear canal and external ear normal.     Left Ear: Hearing, tympanic membrane, ear canal and external ear normal.     Mouth/Throat:     Mouth: Mucous membranes are moist.     Pharynx: Oropharynx is clear. No oropharyngeal exudate or posterior oropharyngeal erythema.  Eyes:     General: No scleral icterus.    Extraocular Movements: Extraocular movements intact.     Conjunctiva/sclera: Conjunctivae normal.     Pupils: Pupils are equal, round, and reactive to light.  Neck:     Thyroid: No thyroid mass or thyromegaly.     Vascular: No carotid bruit.  Cardiovascular:     Rate and Rhythm: Normal rate and regular rhythm.     Pulses: Normal pulses.          Radial pulses are 2+ on the right side and 2+ on the left side.     Heart sounds: Murmur (3/6 systolic throughout) heard.  Pulmonary:     Effort: Pulmonary effort is normal. No respiratory distress.     Breath sounds: Normal breath sounds. No wheezing, rhonchi or rales.  Abdominal:     General: Bowel sounds are normal. There is no distension.     Palpations: Abdomen is soft. There is no mass.     Tenderness: There is no  abdominal tenderness. There is no guarding or rebound.     Hernia: No hernia is present.  Musculoskeletal:        General: Normal range of motion.     Cervical back: Normal range of motion and neck supple.     Right lower leg: No edema.     Left lower leg: No edema.  Lymphadenopathy:     Cervical: No cervical adenopathy.  Skin:    General: Skin is warm and dry.     Findings: No rash.  Neurological:     General: No focal deficit present.     Mental Status: He is alert and oriented to person, place, and time.  Psychiatric:        Mood and Affect: Mood normal.        Behavior: Behavior normal.        Thought Content: Thought content normal.        Judgment: Judgment normal.       Results for orders placed or performed in visit on 01/24/23  PSA, Medicare  Result Value Ref Range   PSA 1.09 0.10 - 4.00 ng/ml  CBC with Differential/Platelet  Result Value Ref Range   WBC 11.7 (H) 4.0 - 10.5 K/uL   RBC 4.63 4.22 - 5.81 Mil/uL   Hemoglobin 14.1 13.0 - 17.0 g/dL   HCT 82.9 56.2 - 13.0 %   MCV 94.5 78.0 - 100.0 fl   MCHC 32.2 30.0 - 36.0 g/dL   RDW 86.5 78.4 - 69.6 %   Platelets 274.0 150.0 - 400.0 K/uL  Neutrophils Relative % 39.1 (L) 43.0 - 77.0 %   Lymphocytes Relative 49.5 (H) 12.0 - 46.0 %   Monocytes Relative 9.4 3.0 - 12.0 %   Eosinophils Relative 1.6 0.0 - 5.0 %   Basophils Relative 0.4 0.0 - 3.0 %   Neutro Abs 4.6 1.4 - 7.7 K/uL   Lymphs Abs 5.8 (H) 0.7 - 4.0 K/uL   Monocytes Absolute 1.1 (H) 0.1 - 1.0 K/uL   Eosinophils Absolute 0.2 0.0 - 0.7 K/uL   Basophils Absolute 0.0 0.0 - 0.1 K/uL  Testosterone  Result Value Ref Range   Testosterone 235.36 (L) 300.00 - 890.00 ng/dL  TSH  Result Value Ref Range   TSH 0.88 0.35 - 5.50 uIU/mL  VITAMIN D 25 Hydroxy (Vit-D Deficiency, Fractures)  Result Value Ref Range   VITD 61.91 30.00 - 100.00 ng/mL  Comprehensive metabolic panel  Result Value Ref Range   Sodium 139 135 - 145 mEq/L   Potassium 4.2 3.5 - 5.1 mEq/L    Chloride 103 96 - 112 mEq/L   CO2 30 19 - 32 mEq/L   Glucose, Bld 104 (H) 70 - 99 mg/dL   BUN 22 6 - 23 mg/dL   Creatinine, Ser 2.44 0.40 - 1.50 mg/dL   Total Bilirubin 0.5 0.2 - 1.2 mg/dL   Alkaline Phosphatase 26 (L) 39 - 117 U/L   AST 26 0 - 37 U/L   ALT 19 0 - 53 U/L   Total Protein 6.4 6.0 - 8.3 g/dL   Albumin 4.3 3.5 - 5.2 g/dL   GFR 01.02 (L) >72.53 mL/min   Calcium 9.3 8.4 - 10.5 mg/dL  Lipid panel  Result Value Ref Range   Cholesterol 137 0 - 200 mg/dL   Triglycerides 66.4 0.0 - 149.0 mg/dL   HDL 40.34 >74.25 mg/dL   VLDL 95.6 0.0 - 38.7 mg/dL   LDL Cholesterol 78 0 - 99 mg/dL   Total CHOL/HDL Ratio 3    NonHDL 97.28     Assessment & Plan:   Problem List Items Addressed This Visit     Advanced directives, counseling/discussion - Primary (Chronic)    Previously discussed - working on this      Vitamin D deficiency    Continue vit D replacement 2000 international units  daily      Hypertrophic obstructive cardiomyopathy (HCC)   Relevant Medications   fenofibrate (TRICOR) 145 MG tablet   OSA (obstructive sleep apnea)    Continues auto-CPAP use.       Migraine with aura    Managed with sparing tylenol #3 Also endorses h/o ocular migraines.       Relevant Medications   fenofibrate (TRICOR) 145 MG tablet   Dyslipidemia    Chronic, stable period on crestor + fenofibrate.  Pending clomid commencement, will recheck FLP at next visit.  The ASCVD Risk score (Arnett DK, et al., 2019) failed to calculate for the following reasons:   The patient has a prior MI or stroke diagnosis       Relevant Medications   fenofibrate (TRICOR) 145 MG tablet   HTN (hypertension)    Chronic, stable on current regimen - continue      Relevant Medications   fenofibrate (TRICOR) 145 MG tablet   Anxiety associated with depression    Sees psychiatry, continues sertraline 50mg  daily.      Severe obesity (BMI 35.0-39.9) with comorbidity (HCC)    Encourage healthy diet and  lifestyle choices to affect sustainable weight loss. Obesity complicated  by comorbidities of hypertension, HDL, OSA, HOCM, atrial fibrillation.       Warfarin anticoagulation    Followed by [facility] cardiology coumadin clinic       H/O mitral valve replacement    INR goal 2.5-3.5      Aphasia as late effect of stroke    Continued predominant memory difficulty.       History of cerebrovascular accident (CVA) with residual deficit   Monoclonal gammopathy    Monoclonal B-cell gammopathy vs early CLL - now seeing onc Q12 months.       Chronic fatigue    ?testosterone related - will f/u with endocrinology.       Meniere disease, left    Has previously seen ENT - on lasix, not on thiazide diuretic. Manages with limiting sodium in diet.       Hypogonadism in male    Followed by endocrinology.       Cognitive deficit as late effect of cerebrovascular accident (CVA)   Hypertriglyceridemia    Continue fibrate, statin      Relevant Medications   fenofibrate (TRICOR) 145 MG tablet   MDD (major depressive disorder), recurrent episode, moderate (HCC)    Sees psychiatry, continues sertraline 50mg  daily.      ADHD (attention deficit hyperactivity disorder)    Followed by psychiatry on adderall.       Paroxysmal atrial fibrillation (HCC)    Continues sotalol, planning ablation next month in efforts to stop sotalol which may be contributing to fatigue Dylan Fletcher).       Relevant Medications   fenofibrate (TRICOR) 145 MG tablet   S/P ventricular septal myectomy   Status post Maze operation for atrial fibrillation   CKD (chronic kidney disease) stage 3, GFR 30-59 ml/min (HCC)    Persistent renal insufficiency since 2021 - will continue to monitor.  Aware of importance of good hydration status Already avoids NSAIDs, will avoid other nephrotoxic agents      Fatty liver    Incidentally noted on abd [place] last year.       Atypical atrial flutter (HCC)   Relevant Medications    fenofibrate (TRICOR) 145 MG tablet     Meds ordered this encounter  Medications   fenofibrate (TRICOR) 145 MG tablet    Sig: Take 1 tablet (145 mg total) by mouth daily.    Dispense:  90 tablet    Refill:  4    No orders of the defined types were placed in this encounter.   Patient Instructions  If interested, check with pharmacy about RSV shot. Bring [place] copy of advanced directive/living will when complete.  Good to see you today Return as needed or in 6 months for follow up visit   Follow up plan: Return in about 6 months (around 08/03/2023) for follow up visit.  Eustaquio Boyden, MD

## 2023-01-31 NOTE — Patient Instructions (Addendum)
If interested, check with pharmacy about RSV shot. Bring [place] copy of advanced directive/living will when complete.  Good to see you today Return as needed or in 6 months for follow up visit

## 2023-01-31 NOTE — Assessment & Plan Note (Signed)
Previously discussed - working on this

## 2023-02-01 ENCOUNTER — Encounter: Payer: Self-pay | Admitting: Family Medicine

## 2023-02-01 DIAGNOSIS — E291 Testicular hypofunction: Secondary | ICD-10-CM | POA: Diagnosis not present

## 2023-02-01 NOTE — Assessment & Plan Note (Signed)
Chronic, stable on current regimen - continue. 

## 2023-02-01 NOTE — Assessment & Plan Note (Signed)
Continue fibrate, statin

## 2023-02-01 NOTE — Assessment & Plan Note (Signed)
Sees psychiatry, continues sertraline 50mg  daily.

## 2023-02-01 NOTE — Assessment & Plan Note (Signed)
Incidentally noted on abd [place] last year.

## 2023-02-01 NOTE — Assessment & Plan Note (Signed)
Monoclonal B-cell gammopathy vs early CLL - now seeing onc Q12 months.

## 2023-02-01 NOTE — Assessment & Plan Note (Signed)
Continues sotalol, planning ablation next month in efforts to stop sotalol which may be contributing to fatigue Sherryll Burger).

## 2023-02-01 NOTE — Assessment & Plan Note (Signed)
Followed by endocrinology 

## 2023-02-01 NOTE — Assessment & Plan Note (Signed)
Followed by psychiatry on adderall.

## 2023-02-01 NOTE — Assessment & Plan Note (Signed)
Followed by [facility] cardiology coumadin clinic

## 2023-02-01 NOTE — Assessment & Plan Note (Signed)
Continue vit D replacement 2000 international units  daily

## 2023-02-01 NOTE — Addendum Note (Signed)
Addended by: Eustaquio Boyden on: 02/01/2023 09:04 AM   Modules accepted: Level of Service

## 2023-02-01 NOTE — Assessment & Plan Note (Signed)
Continued predominant memory difficulty.

## 2023-02-01 NOTE — Assessment & Plan Note (Signed)
Continues auto-CPAP use.

## 2023-02-01 NOTE — Assessment & Plan Note (Addendum)
Managed with sparing tylenol #3 Also endorses h/o ocular migraines.

## 2023-02-01 NOTE — Assessment & Plan Note (Signed)
Chronic, stable period on crestor + fenofibrate.  Pending clomid commencement, will recheck FLP at next visit.  The ASCVD Risk score (Arnett DK, et al., 2019) failed to calculate for the following reasons:   The patient has a prior MI or stroke diagnosis

## 2023-02-01 NOTE — Assessment & Plan Note (Signed)
INR goal 2.5-3.5

## 2023-02-01 NOTE — Assessment & Plan Note (Signed)
Has previously seen ENT - on lasix, not on thiazide diuretic. Manages with limiting sodium in diet.

## 2023-02-01 NOTE — Assessment & Plan Note (Signed)
Encourage healthy diet and lifestyle choices to affect sustainable weight loss. Obesity complicated by comorbidities of hypertension, HDL, OSA, HOCM, atrial fibrillation.

## 2023-02-01 NOTE — Assessment & Plan Note (Addendum)
Persistent renal insufficiency since 2021 - will continue to monitor.  Aware of importance of good hydration status Already avoids NSAIDs, will avoid other nephrotoxic agents

## 2023-02-01 NOTE — Assessment & Plan Note (Signed)
?  testosterone related - will f/u with endocrinology.

## 2023-02-08 DIAGNOSIS — Z79899 Other long term (current) drug therapy: Secondary | ICD-10-CM | POA: Diagnosis not present

## 2023-02-08 DIAGNOSIS — F3342 Major depressive disorder, recurrent, in full remission: Secondary | ICD-10-CM | POA: Diagnosis not present

## 2023-02-08 DIAGNOSIS — F900 Attention-deficit hyperactivity disorder, predominantly inattentive type: Secondary | ICD-10-CM | POA: Diagnosis not present

## 2023-02-08 DIAGNOSIS — I69311 Memory deficit following cerebral infarction: Secondary | ICD-10-CM | POA: Diagnosis not present

## 2023-02-08 DIAGNOSIS — F411 Generalized anxiety disorder: Secondary | ICD-10-CM | POA: Diagnosis not present

## 2023-02-14 DIAGNOSIS — I4891 Unspecified atrial fibrillation: Secondary | ICD-10-CM | POA: Diagnosis not present

## 2023-02-20 DIAGNOSIS — I4891 Unspecified atrial fibrillation: Secondary | ICD-10-CM | POA: Diagnosis not present

## 2023-02-27 DIAGNOSIS — Z952 Presence of prosthetic heart valve: Secondary | ICD-10-CM | POA: Diagnosis not present

## 2023-03-06 DIAGNOSIS — I4819 Other persistent atrial fibrillation: Secondary | ICD-10-CM | POA: Diagnosis not present

## 2023-03-07 ENCOUNTER — Encounter: Payer: Self-pay | Admitting: Internal Medicine

## 2023-03-07 ENCOUNTER — Ambulatory Visit (INDEPENDENT_AMBULATORY_CARE_PROVIDER_SITE_OTHER): Payer: Medicare Other | Admitting: Internal Medicine

## 2023-03-07 ENCOUNTER — Ambulatory Visit (INDEPENDENT_AMBULATORY_CARE_PROVIDER_SITE_OTHER)
Admission: RE | Admit: 2023-03-07 | Discharge: 2023-03-07 | Disposition: A | Discharge status: Home / Self Care | Payer: Medicare Other | Source: Ambulatory Visit | Attending: Internal Medicine | Admitting: Internal Medicine

## 2023-03-07 VITALS — BP 128/74 | HR 68 | Temp 97.6°F | Ht 65.25 in | Wt 220.0 lb

## 2023-03-07 DIAGNOSIS — R053 Chronic cough: Secondary | ICD-10-CM | POA: Diagnosis not present

## 2023-03-07 DIAGNOSIS — I4891 Unspecified atrial fibrillation: Secondary | ICD-10-CM | POA: Diagnosis not present

## 2023-03-07 DIAGNOSIS — J449 Chronic obstructive pulmonary disease, unspecified: Secondary | ICD-10-CM | POA: Diagnosis not present

## 2023-03-07 MED ORDER — AMOXICILLIN-POT CLAVULANATE 875-125 MG PO TABS: 1.0000 | ORAL_TABLET | Freq: Two times a day (BID) | ORAL | 0 refills | Status: DC

## 2023-03-07 MED ORDER — HYDROCODONE BIT-HOMATROP MBR 5-1.5 MG/5ML PO SOLN: 5.0000 mL | Freq: Every evening | ORAL | 0 refills | Status: DC | PRN

## 2023-03-07 NOTE — Assessment & Plan Note (Addendum)
Doesn't seem to be fluid related---infectious No clear auscultatory findings of pneumonia--but diaphoretic and some SOB---will check CXR CXR shows no pneumonia Still concerned about sinus or other bacterial infection Will give augmentin 875 bid x 7 days Hycodan for cough

## 2023-03-07 NOTE — Progress Notes (Signed)
Subjective:    Patient ID: Dylan Fletcher, male    DOB: Jun 19, 1961, 62 y.o.   MRN: [id number]  HPI Here with wife due to cough--she give most of the history  Started about 10-12 days ago--with cough Tried mucinex Cough is mostly dry No fever but some sweating Some SOB--with deep breathing (causes cough) Increased fatigue--but chronic No sore throat, headache, ear pain Regular tylenol use Cough drops not helping  Has hypertrophic cardiomyopathy---some surgeries upcoming ablation Contacted EP--took some extra lasix but didn't help  Current Outpatient Medications on File Prior to Visit  Medication Sig Dispense Refill   acetaminophen (TYLENOL) 500 MG tablet Take 1 tablet (500 mg total) by mouth 2 (two) times daily.     acetaminophen-codeine (TYLENOL #3) 300-30 MG tablet Take 1 tablet by mouth every 8 (eight) hours as needed for moderate pain. 15 tablet 0   amoxicillin (AMOXIL) 500 MG tablet Take 500 mg by mouth as directed. Takes 4 tablets 1 hour prior to dental procedures     amphetamine-dextroamphetamine (ADDERALL XR) 20 MG 24 hr capsule Take 20 mg by mouth daily. Takes 2 tablets (40 mg) every morning     amphetamine-dextroamphetamine (ADDERALL) 30 MG tablet Take by mouth.     buPROPion (WELLBUTRIN XL) 150 MG 24 hr tablet Take 1 tablet by mouth every morning.     Cholecalciferol 50 MCG (2000 UT) CAPS 1 capsule every day by oral route.     diphenhydrAMINE (BENADRYL) 25 mg capsule Take by mouth.     famotidine (PEPCID) 20 MG tablet Take by mouth.     fenofibrate (TRICOR) 145 MG tablet Take 1 tablet (145 mg total) by mouth daily. 90 tablet 4   furosemide (LASIX) 20 MG tablet Take 1 tablet (20 mg total) by mouth daily. With extra prn weight gain     Glucosamine-Vitamin D 1000-200 MG-UNIT TABS Take 2 tablets by mouth daily.     liothyronine (CYTOMEL) 5 MCG tablet Take 2 tablets by mouth daily.     losartan (COZAAR) 100 MG tablet      meclizine (ANTIVERT) 25 MG tablet Take by mouth.      Multiple Vitamin (MULTIVITAMIN) tablet Take 1 tablet by mouth daily.     rosuvastatin (CRESTOR) 10 MG tablet Take 10 mg by mouth at bedtime.     sertraline (ZOLOFT) 50 MG tablet Take 50 mg by mouth daily.     sildenafil (VIAGRA) 100 MG tablet Take 1 tablet (100 mg total) by mouth daily as needed for erectile dysfunction. 10 tablet 3   sotalol (BETAPACE) 80 MG tablet Take 80 mg by mouth 2 (two) times daily.     warfarin (COUMADIN) 5 MG tablet Take 5 mg by mouth as directed.      warfarin (COUMADIN) 7.5 MG tablet SMARTSIG:0.5-1 Tablet(s) By Mouth As Directed     No current facility-administered medications on file prior to visit.    Allergies  Allergen Reactions   Nicardipine Hcl Shortness Of Breath    Shortness of breath   Nitroglycerin Anaphylaxis   Calcium Channel Blockers Other (See Comments)    Shortness of breath   Molds & Smuts Other (See Comments)    Ragweed, pollen, mold, etc.   Shellfish Allergy Nausea And Vomiting   Tape Rash    Past Medical History:  Diagnosis Date   ADD (attention deficit disorder)    sees psych in [place], [place] (meds from there)   Allergic rhinitis    Ragweed, mold, mildew   Anxiety  associated with depression    sees psych in [place], [place] (meds from there)   Arrhythmia    Claustrophobia    Colon polyp 04/19/2009   rectal tubular adenoma, rec rpt 3 yrs   COVID-19 05/24/2022   CVA (cerebral infarction) 08/17/2013   L thalamic lacunar infarct post CVTS surgery, s/p neuropsychology testing, completed speech therapy. no ASA 2/2 no CAD hx   ED (erectile dysfunction)    H/O mitral valve replacement 08/17/2013   St Jude, needs abx ppx, completed cardiac rehab 12/2013   History of asthma    as child   HLD (hyperlipidemia)    HTN (hypertension)    Hypertrophic obstructive cardiomyopathy(425.11)    Cards (Dr. Marlis Edelson [facility] - need abx prophylaxis - now established with [facility] Dr. Elmer Picker [phone]) and Dr. Celine Mans and Mason Jim s/p myomectomy, now cardiac rehab [facility] 09/2013   LBBB (left bundle branch block) 08/17/2013   after myomectomy   Migraine with aura    Need for prophylactic antibiotic    Obesity    saw nutritionist 12/18/2014   OSA (obstructive sleep apnea)    CPAP at night, up to 16 mmHg (Dr Margo Aye at [facility])   Paroxysmal atrial fibrillation (HCC) 04/2013, 05/2013   with RVR; s/p multiple hospitalizations, spontaneous conversion, referred to Dr. Sherryll Burger EP, then recurrence - failed sotalol, multaq (both with spont conversions)   Transient alteration of awareness 05/19/2014   during hospitalization pending EEG [facility])   Vitamin D deficiency    Warfarin anticoagulation    goal INR 2.5-3.5    Past Surgical History:  Procedure Laterality Date   CARDIAC CATHETERIZATION  2014   done for chest pain, no plaque buildup   COLONOSCOPY  04/2009   rectal tubular adenoma x1, rpt 3 yrs   COLONOSCOPY  04/2014   mild diverticulosis, no polyps, rec rpt 5 yrs [facility])   EYE SURGERY  1969; 1971   EYE SURGERY Right 04/2015   for PVD   hospitalization  04/2014   subtherapeutic INR, heparin bridge   INGUINAL HERNIA REPAIR  1987   KNEE ARTHROSCOPY  2006; 2007   right   MITRAL VALVE REPLACEMENT  08/2013   St Jude [facility] for severe central mitral regurg   MYOMECTOMY  08/22/2013   with MAZE procedure [facility] (Drs Loleta Chance)   PFTs  07/2013   FVC 79%, FEV1 72%, ratio 0.71 - mild obstruction, o/w normal   VASECTOMY  1992   WISDOM TOOTH EXTRACTION  1977    Family History  Problem Relation Age of Onset   Alcohol abuse Brother    Cancer Mother 22       breast   Ulcers Mother    Heart disease Mother        HOCM   Psoriasis Father    Cancer Maternal Grandfather        colon   Coronary artery disease Paternal Grandfather 33       MI   Diabetes Neg Hx    Stroke Neg Hx     Social History   Socioeconomic History   Marital status: Married    Spouse name: Not on file   Number of children: Not  on file   Years of education: Not on file   Highest education level: Not on file  Occupational History   Not on file  Tobacco Use   Smoking status: Never   Smokeless tobacco: Never  Substance and Sexual Activity   Alcohol  use: Yes    Comment: rarely   Drug use: No   Sexual activity: Not on file  Other Topics Concern   Not on file  Social History Narrative   Caffeine: quart of soda/day   Lives with wife ([name]), 3 cats. No children.   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   Edu: Bachelor's degree   Activity: walking several times a week   Diet: good water, fruits/vegetables daily       Cards: Dr Marlis Edelson [facility] (HOCM) and Dr Cherene Altes (EP), as well as [place] Dr Deneise Lever   Social Determinants of Health   Financial Resource Strain: Low Risk  (01/29/2023)   Overall Financial Resource Strain (CARDIA)    Difficulty of Paying Living Expenses: Not hard at all  Food Insecurity: No Food Insecurity (01/29/2023)   Hunger Vital Sign    Worried About Running Out of Food in the Last Year: Never true    Ran Out of Food in the Last Year: Never true  Transportation Needs: No Transportation Needs (01/29/2023)   PRAPARE - Transportation    Lack of Transportation (Medical): No    Lack of Transportation (Non-Medical): No  Physical Activity: Inactive (01/29/2023)   Exercise Vital Sign    Days of Exercise per Week: 0 days    Minutes of Exercise per Session: 0 min  Stress: No Stress Concern Present (01/29/2023)   [employer] of Occupational Health - Occupational Stress Questionnaire    Feeling of Stress : Not at all  Social Connections: Socially Integrated (01/29/2023)   Social Connection and Isolation Panel [NHANES]    Frequency of Communication with Friends and Family: Three times a week    Frequency of Social Gatherings with Friends and Family: Three times a week    Attends Religious Services: More than 4 times per year    Active Member of Clubs or Organizations: Yes     Attends Banker Meetings: More than 4 times per year    Marital Status: Married  Catering manager Violence: Not At Risk (01/29/2023)   Humiliation, Afraid, Rape, and Kick questionnaire    Fear of Current or Ex-Partner: No    Emotionally Abused: No    Physically Abused: No    Sexually Abused: No   Review of Systems Does monitor weight---stable at home Sleeps in bed--thin pillow. Some PND since this started--with change of position (mostly cough) Some effect on sleep from the cough    Objective:   Physical Exam Constitutional:      Appearance: Normal appearance.     Comments: Frequent coarse cough Diaphoretic (trunk is wet) NAD  HENT:     Head:     Comments: Maxillary sensitivity     Right Ear: Tympanic membrane and ear canal normal.     Left Ear: Tympanic membrane and ear canal normal.     Mouth/Throat:     Pharynx: No oropharyngeal exudate or posterior oropharyngeal erythema.  Cardiovascular:     Rate and Rhythm: Normal rate and regular rhythm.     Heart sounds:     No gallop.     Comments: Valve click Pulmonary:     Effort: Pulmonary effort is normal.     Breath sounds: No wheezing or rales.     Comments: Fair air movement Slight exp squeak LLL Musculoskeletal:     Right lower leg: No edema.     Left lower leg: No edema.  Neurological:     Mental Status:  He is alert.            Assessment & Plan:

## 2023-03-15 DIAGNOSIS — Z01818 Encounter for other preprocedural examination: Secondary | ICD-10-CM | POA: Diagnosis not present

## 2023-03-15 DIAGNOSIS — Z01810 Encounter for preprocedural cardiovascular examination: Secondary | ICD-10-CM | POA: Diagnosis not present

## 2023-03-15 DIAGNOSIS — I442 Atrioventricular block, complete: Secondary | ICD-10-CM | POA: Diagnosis not present

## 2023-03-15 DIAGNOSIS — Z01812 Encounter for preprocedural laboratory examination: Secondary | ICD-10-CM | POA: Diagnosis not present

## 2023-03-15 DIAGNOSIS — I447 Left bundle-branch block, unspecified: Secondary | ICD-10-CM | POA: Diagnosis not present

## 2023-03-15 DIAGNOSIS — I4891 Unspecified atrial fibrillation: Secondary | ICD-10-CM | POA: Diagnosis not present

## 2023-03-19 DIAGNOSIS — I509 Heart failure, unspecified: Secondary | ICD-10-CM | POA: Diagnosis present

## 2023-03-19 DIAGNOSIS — I4891 Unspecified atrial fibrillation: Secondary | ICD-10-CM | POA: Diagnosis not present

## 2023-03-19 DIAGNOSIS — K660 Peritoneal adhesions (postprocedural) (postinfection): Secondary | ICD-10-CM | POA: Diagnosis present

## 2023-03-19 DIAGNOSIS — I421 Obstructive hypertrophic cardiomyopathy: Secondary | ICD-10-CM | POA: Diagnosis not present

## 2023-03-19 DIAGNOSIS — J45909 Unspecified asthma, uncomplicated: Secondary | ICD-10-CM | POA: Diagnosis present

## 2023-03-19 DIAGNOSIS — N3289 Other specified disorders of bladder: Secondary | ICD-10-CM | POA: Diagnosis present

## 2023-03-19 DIAGNOSIS — I4892 Unspecified atrial flutter: Secondary | ICD-10-CM | POA: Diagnosis not present

## 2023-03-19 DIAGNOSIS — D72820 Lymphocytosis (symptomatic): Secondary | ICD-10-CM | POA: Diagnosis not present

## 2023-03-19 DIAGNOSIS — E785 Hyperlipidemia, unspecified: Secondary | ICD-10-CM | POA: Diagnosis present

## 2023-03-19 DIAGNOSIS — K5720 Diverticulitis of large intestine with perforation and abscess without bleeding: Secondary | ICD-10-CM | POA: Diagnosis present

## 2023-03-19 DIAGNOSIS — S301XXA Contusion of abdominal wall, initial encounter: Secondary | ICD-10-CM | POA: Diagnosis not present

## 2023-03-19 DIAGNOSIS — I351 Nonrheumatic aortic (valve) insufficiency: Secondary | ICD-10-CM | POA: Diagnosis not present

## 2023-03-19 DIAGNOSIS — I422 Other hypertrophic cardiomyopathy: Secondary | ICD-10-CM | POA: Diagnosis present

## 2023-03-19 DIAGNOSIS — Z5181 Encounter for therapeutic drug level monitoring: Secondary | ICD-10-CM | POA: Diagnosis not present

## 2023-03-19 DIAGNOSIS — D696 Thrombocytopenia, unspecified: Secondary | ICD-10-CM | POA: Diagnosis not present

## 2023-03-19 DIAGNOSIS — K219 Gastro-esophageal reflux disease without esophagitis: Secondary | ICD-10-CM | POA: Diagnosis present

## 2023-03-19 DIAGNOSIS — Z6835 Body mass index (BMI) 35.0-35.9, adult: Secondary | ICD-10-CM | POA: Diagnosis not present

## 2023-03-19 DIAGNOSIS — K567 Ileus, unspecified: Secondary | ICD-10-CM | POA: Diagnosis not present

## 2023-03-19 DIAGNOSIS — Z954 Presence of other heart-valve replacement: Secondary | ICD-10-CM | POA: Diagnosis not present

## 2023-03-19 DIAGNOSIS — I2510 Atherosclerotic heart disease of native coronary artery without angina pectoris: Secondary | ICD-10-CM | POA: Diagnosis present

## 2023-03-19 DIAGNOSIS — I484 Atypical atrial flutter: Secondary | ICD-10-CM | POA: Diagnosis present

## 2023-03-19 DIAGNOSIS — L7632 Postprocedural hematoma of skin and subcutaneous tissue following other procedure: Secondary | ICD-10-CM | POA: Diagnosis not present

## 2023-03-19 DIAGNOSIS — K5780 Diverticulitis of intestine, part unspecified, with perforation and abscess without bleeding: Secondary | ICD-10-CM | POA: Diagnosis not present

## 2023-03-19 DIAGNOSIS — I480 Paroxysmal atrial fibrillation: Secondary | ICD-10-CM | POA: Diagnosis not present

## 2023-03-19 DIAGNOSIS — F909 Attention-deficit hyperactivity disorder, unspecified type: Secondary | ICD-10-CM | POA: Diagnosis present

## 2023-03-19 DIAGNOSIS — Z7901 Long term (current) use of anticoagulants: Secondary | ICD-10-CM | POA: Diagnosis not present

## 2023-03-19 DIAGNOSIS — K659 Peritonitis, unspecified: Secondary | ICD-10-CM | POA: Diagnosis present

## 2023-03-19 DIAGNOSIS — I5033 Acute on chronic diastolic (congestive) heart failure: Secondary | ICD-10-CM | POA: Diagnosis not present

## 2023-03-19 DIAGNOSIS — C9110 Chronic lymphocytic leukemia of B-cell type not having achieved remission: Secondary | ICD-10-CM | POA: Diagnosis present

## 2023-03-19 DIAGNOSIS — I10 Essential (primary) hypertension: Secondary | ICD-10-CM | POA: Diagnosis not present

## 2023-03-19 DIAGNOSIS — I110 Hypertensive heart disease with heart failure: Secondary | ICD-10-CM | POA: Diagnosis present

## 2023-03-19 DIAGNOSIS — D6869 Other thrombophilia: Secondary | ICD-10-CM | POA: Diagnosis present

## 2023-03-19 DIAGNOSIS — I4819 Other persistent atrial fibrillation: Secondary | ICD-10-CM | POA: Diagnosis present

## 2023-03-19 DIAGNOSIS — K5792 Diverticulitis of intestine, part unspecified, without perforation or abscess without bleeding: Secondary | ICD-10-CM | POA: Diagnosis not present

## 2023-03-19 DIAGNOSIS — Z952 Presence of prosthetic heart valve: Secondary | ICD-10-CM | POA: Diagnosis not present

## 2023-03-20 DIAGNOSIS — Z952 Presence of prosthetic heart valve: Secondary | ICD-10-CM | POA: Diagnosis not present

## 2023-03-20 DIAGNOSIS — I4819 Other persistent atrial fibrillation: Secondary | ICD-10-CM | POA: Diagnosis not present

## 2023-03-20 DIAGNOSIS — I484 Atypical atrial flutter: Secondary | ICD-10-CM | POA: Diagnosis not present

## 2023-03-20 DIAGNOSIS — I4891 Unspecified atrial fibrillation: Secondary | ICD-10-CM | POA: Diagnosis not present

## 2023-03-20 DIAGNOSIS — I421 Obstructive hypertrophic cardiomyopathy: Secondary | ICD-10-CM | POA: Diagnosis not present

## 2023-03-21 DIAGNOSIS — I110 Hypertensive heart disease with heart failure: Secondary | ICD-10-CM | POA: Diagnosis present

## 2023-03-21 DIAGNOSIS — K5780 Diverticulitis of intestine, part unspecified, with perforation and abscess without bleeding: Secondary | ICD-10-CM | POA: Diagnosis not present

## 2023-03-21 DIAGNOSIS — S301XXA Contusion of abdominal wall, initial encounter: Secondary | ICD-10-CM | POA: Diagnosis not present

## 2023-03-21 DIAGNOSIS — E785 Hyperlipidemia, unspecified: Secondary | ICD-10-CM | POA: Diagnosis present

## 2023-03-21 DIAGNOSIS — I4819 Other persistent atrial fibrillation: Secondary | ICD-10-CM | POA: Diagnosis present

## 2023-03-21 DIAGNOSIS — Z952 Presence of prosthetic heart valve: Secondary | ICD-10-CM | POA: Diagnosis not present

## 2023-03-21 DIAGNOSIS — I484 Atypical atrial flutter: Secondary | ICD-10-CM | POA: Diagnosis present

## 2023-03-21 DIAGNOSIS — I480 Paroxysmal atrial fibrillation: Secondary | ICD-10-CM | POA: Diagnosis not present

## 2023-03-21 DIAGNOSIS — D72820 Lymphocytosis (symptomatic): Secondary | ICD-10-CM | POA: Diagnosis not present

## 2023-03-21 DIAGNOSIS — I422 Other hypertrophic cardiomyopathy: Secondary | ICD-10-CM | POA: Diagnosis present

## 2023-03-21 DIAGNOSIS — I4892 Unspecified atrial flutter: Secondary | ICD-10-CM | POA: Diagnosis not present

## 2023-03-21 DIAGNOSIS — K660 Peritoneal adhesions (postprocedural) (postinfection): Secondary | ICD-10-CM | POA: Diagnosis present

## 2023-03-21 DIAGNOSIS — K659 Peritonitis, unspecified: Secondary | ICD-10-CM | POA: Diagnosis present

## 2023-03-21 DIAGNOSIS — Z6835 Body mass index (BMI) 35.0-35.9, adult: Secondary | ICD-10-CM | POA: Diagnosis not present

## 2023-03-21 DIAGNOSIS — I4891 Unspecified atrial fibrillation: Secondary | ICD-10-CM | POA: Diagnosis not present

## 2023-03-21 DIAGNOSIS — N3289 Other specified disorders of bladder: Secondary | ICD-10-CM | POA: Diagnosis present

## 2023-03-21 DIAGNOSIS — K219 Gastro-esophageal reflux disease without esophagitis: Secondary | ICD-10-CM | POA: Diagnosis present

## 2023-03-21 DIAGNOSIS — Z7901 Long term (current) use of anticoagulants: Secondary | ICD-10-CM | POA: Diagnosis not present

## 2023-03-21 DIAGNOSIS — J45909 Unspecified asthma, uncomplicated: Secondary | ICD-10-CM | POA: Diagnosis present

## 2023-03-21 DIAGNOSIS — C9110 Chronic lymphocytic leukemia of B-cell type not having achieved remission: Secondary | ICD-10-CM | POA: Diagnosis present

## 2023-03-21 DIAGNOSIS — I509 Heart failure, unspecified: Secondary | ICD-10-CM | POA: Diagnosis present

## 2023-03-21 DIAGNOSIS — D696 Thrombocytopenia, unspecified: Secondary | ICD-10-CM | POA: Diagnosis not present

## 2023-03-21 DIAGNOSIS — D6869 Other thrombophilia: Secondary | ICD-10-CM | POA: Diagnosis present

## 2023-03-21 DIAGNOSIS — K567 Ileus, unspecified: Secondary | ICD-10-CM | POA: Diagnosis not present

## 2023-03-21 DIAGNOSIS — Z954 Presence of other heart-valve replacement: Secondary | ICD-10-CM | POA: Diagnosis not present

## 2023-03-21 DIAGNOSIS — L7632 Postprocedural hematoma of skin and subcutaneous tissue following other procedure: Secondary | ICD-10-CM | POA: Diagnosis not present

## 2023-03-21 DIAGNOSIS — I2510 Atherosclerotic heart disease of native coronary artery without angina pectoris: Secondary | ICD-10-CM | POA: Diagnosis present

## 2023-03-21 DIAGNOSIS — Z5181 Encounter for therapeutic drug level monitoring: Secondary | ICD-10-CM | POA: Diagnosis not present

## 2023-03-21 DIAGNOSIS — I10 Essential (primary) hypertension: Secondary | ICD-10-CM | POA: Diagnosis not present

## 2023-03-21 DIAGNOSIS — I351 Nonrheumatic aortic (valve) insufficiency: Secondary | ICD-10-CM | POA: Diagnosis not present

## 2023-03-21 DIAGNOSIS — K5792 Diverticulitis of intestine, part unspecified, without perforation or abscess without bleeding: Secondary | ICD-10-CM | POA: Diagnosis not present

## 2023-03-21 DIAGNOSIS — I421 Obstructive hypertrophic cardiomyopathy: Secondary | ICD-10-CM | POA: Diagnosis not present

## 2023-03-21 DIAGNOSIS — K5720 Diverticulitis of large intestine with perforation and abscess without bleeding: Secondary | ICD-10-CM | POA: Diagnosis present

## 2023-03-21 DIAGNOSIS — F909 Attention-deficit hyperactivity disorder, unspecified type: Secondary | ICD-10-CM | POA: Diagnosis present

## 2023-03-22 DIAGNOSIS — I484 Atypical atrial flutter: Secondary | ICD-10-CM | POA: Diagnosis not present

## 2023-03-22 DIAGNOSIS — I4891 Unspecified atrial fibrillation: Secondary | ICD-10-CM | POA: Diagnosis not present

## 2023-03-23 DIAGNOSIS — I484 Atypical atrial flutter: Secondary | ICD-10-CM | POA: Diagnosis not present

## 2023-03-23 DIAGNOSIS — I4891 Unspecified atrial fibrillation: Secondary | ICD-10-CM | POA: Diagnosis not present

## 2023-03-24 DIAGNOSIS — D696 Thrombocytopenia, unspecified: Secondary | ICD-10-CM | POA: Diagnosis not present

## 2023-03-25 DIAGNOSIS — I484 Atypical atrial flutter: Secondary | ICD-10-CM | POA: Diagnosis not present

## 2023-03-25 DIAGNOSIS — I4819 Other persistent atrial fibrillation: Secondary | ICD-10-CM | POA: Diagnosis not present

## 2023-03-25 DIAGNOSIS — D6869 Other thrombophilia: Secondary | ICD-10-CM | POA: Diagnosis not present

## 2023-03-25 DIAGNOSIS — I422 Other hypertrophic cardiomyopathy: Secondary | ICD-10-CM | POA: Diagnosis not present

## 2023-03-26 DIAGNOSIS — I351 Nonrheumatic aortic (valve) insufficiency: Secondary | ICD-10-CM | POA: Diagnosis not present

## 2023-03-27 DIAGNOSIS — D696 Thrombocytopenia, unspecified: Secondary | ICD-10-CM | POA: Diagnosis not present

## 2023-03-29 DIAGNOSIS — D696 Thrombocytopenia, unspecified: Secondary | ICD-10-CM | POA: Diagnosis not present

## 2023-03-30 DIAGNOSIS — D696 Thrombocytopenia, unspecified: Secondary | ICD-10-CM | POA: Diagnosis not present

## 2023-03-31 DIAGNOSIS — D696 Thrombocytopenia, unspecified: Secondary | ICD-10-CM | POA: Diagnosis not present

## 2023-04-01 DIAGNOSIS — D696 Thrombocytopenia, unspecified: Secondary | ICD-10-CM | POA: Diagnosis not present

## 2023-04-02 DIAGNOSIS — D696 Thrombocytopenia, unspecified: Secondary | ICD-10-CM | POA: Diagnosis not present

## 2023-04-03 DIAGNOSIS — D696 Thrombocytopenia, unspecified: Secondary | ICD-10-CM | POA: Diagnosis not present

## 2023-04-04 DIAGNOSIS — D649 Anemia, unspecified: Secondary | ICD-10-CM | POA: Diagnosis not present

## 2023-04-04 DIAGNOSIS — E669 Obesity, unspecified: Secondary | ICD-10-CM | POA: Diagnosis not present

## 2023-04-04 DIAGNOSIS — D696 Thrombocytopenia, unspecified: Secondary | ICD-10-CM | POA: Diagnosis not present

## 2023-04-04 DIAGNOSIS — K219 Gastro-esophageal reflux disease without esophagitis: Secondary | ICD-10-CM | POA: Diagnosis not present

## 2023-04-04 DIAGNOSIS — I4819 Other persistent atrial fibrillation: Secondary | ICD-10-CM | POA: Diagnosis not present

## 2023-04-04 DIAGNOSIS — G4733 Obstructive sleep apnea (adult) (pediatric): Secondary | ICD-10-CM | POA: Diagnosis not present

## 2023-04-04 DIAGNOSIS — Z7901 Long term (current) use of anticoagulants: Secondary | ICD-10-CM | POA: Diagnosis not present

## 2023-04-04 DIAGNOSIS — Z48815 Encounter for surgical aftercare following surgery on the digestive system: Secondary | ICD-10-CM | POA: Diagnosis not present

## 2023-04-04 DIAGNOSIS — F32A Depression, unspecified: Secondary | ICD-10-CM | POA: Diagnosis not present

## 2023-04-04 DIAGNOSIS — I422 Other hypertrophic cardiomyopathy: Secondary | ICD-10-CM | POA: Diagnosis not present

## 2023-04-04 DIAGNOSIS — E785 Hyperlipidemia, unspecified: Secondary | ICD-10-CM | POA: Diagnosis not present

## 2023-04-04 DIAGNOSIS — D6869 Other thrombophilia: Secondary | ICD-10-CM | POA: Diagnosis not present

## 2023-04-04 DIAGNOSIS — G43909 Migraine, unspecified, not intractable, without status migrainosus: Secondary | ICD-10-CM | POA: Diagnosis not present

## 2023-04-04 DIAGNOSIS — Z6835 Body mass index (BMI) 35.0-35.9, adult: Secondary | ICD-10-CM | POA: Diagnosis not present

## 2023-04-04 DIAGNOSIS — I447 Left bundle-branch block, unspecified: Secondary | ICD-10-CM | POA: Diagnosis not present

## 2023-04-04 DIAGNOSIS — J45909 Unspecified asthma, uncomplicated: Secondary | ICD-10-CM | POA: Diagnosis not present

## 2023-04-04 DIAGNOSIS — Z952 Presence of prosthetic heart valve: Secondary | ICD-10-CM | POA: Diagnosis not present

## 2023-04-04 DIAGNOSIS — F909 Attention-deficit hyperactivity disorder, unspecified type: Secondary | ICD-10-CM | POA: Diagnosis not present

## 2023-04-04 DIAGNOSIS — Z8673 Personal history of transient ischemic attack (TIA), and cerebral infarction without residual deficits: Secondary | ICD-10-CM | POA: Diagnosis not present

## 2023-04-04 DIAGNOSIS — I110 Hypertensive heart disease with heart failure: Secondary | ICD-10-CM | POA: Diagnosis not present

## 2023-04-04 DIAGNOSIS — H8109 Meniere's disease, unspecified ear: Secondary | ICD-10-CM | POA: Diagnosis not present

## 2023-04-04 DIAGNOSIS — I484 Atypical atrial flutter: Secondary | ICD-10-CM | POA: Diagnosis not present

## 2023-04-04 DIAGNOSIS — Z433 Encounter for attention to colostomy: Secondary | ICD-10-CM | POA: Diagnosis not present

## 2023-04-04 DIAGNOSIS — I509 Heart failure, unspecified: Secondary | ICD-10-CM | POA: Diagnosis not present

## 2023-04-06 DIAGNOSIS — Z933 Colostomy status: Secondary | ICD-10-CM | POA: Diagnosis not present

## 2023-04-06 DIAGNOSIS — E669 Obesity, unspecified: Secondary | ICD-10-CM | POA: Diagnosis not present

## 2023-04-06 DIAGNOSIS — Z888 Allergy status to other drugs, medicaments and biological substances status: Secondary | ICD-10-CM | POA: Diagnosis not present

## 2023-04-06 DIAGNOSIS — I110 Hypertensive heart disease with heart failure: Secondary | ICD-10-CM | POA: Diagnosis not present

## 2023-04-06 DIAGNOSIS — I509 Heart failure, unspecified: Secondary | ICD-10-CM | POA: Diagnosis not present

## 2023-04-06 DIAGNOSIS — R569 Unspecified convulsions: Secondary | ICD-10-CM | POA: Diagnosis not present

## 2023-04-06 DIAGNOSIS — Z9049 Acquired absence of other specified parts of digestive tract: Secondary | ICD-10-CM | POA: Diagnosis not present

## 2023-04-06 DIAGNOSIS — F32A Depression, unspecified: Secondary | ICD-10-CM | POA: Diagnosis not present

## 2023-04-06 DIAGNOSIS — K5720 Diverticulitis of large intestine with perforation and abscess without bleeding: Secondary | ICD-10-CM | POA: Diagnosis not present

## 2023-04-06 DIAGNOSIS — I480 Paroxysmal atrial fibrillation: Secondary | ICD-10-CM | POA: Diagnosis not present

## 2023-04-06 DIAGNOSIS — T8141XA Infection following a procedure, superficial incisional surgical site, initial encounter: Secondary | ICD-10-CM | POA: Diagnosis not present

## 2023-04-06 DIAGNOSIS — R188 Other ascites: Secondary | ICD-10-CM | POA: Diagnosis not present

## 2023-04-06 DIAGNOSIS — G4733 Obstructive sleep apnea (adult) (pediatric): Secondary | ICD-10-CM | POA: Diagnosis not present

## 2023-04-06 DIAGNOSIS — D638 Anemia in other chronic diseases classified elsewhere: Secondary | ICD-10-CM | POA: Diagnosis not present

## 2023-04-06 DIAGNOSIS — R791 Abnormal coagulation profile: Secondary | ICD-10-CM | POA: Diagnosis not present

## 2023-04-06 DIAGNOSIS — Z79899 Other long term (current) drug therapy: Secondary | ICD-10-CM | POA: Diagnosis not present

## 2023-04-06 DIAGNOSIS — I422 Other hypertrophic cardiomyopathy: Secondary | ICD-10-CM | POA: Diagnosis not present

## 2023-04-06 DIAGNOSIS — Z7901 Long term (current) use of anticoagulants: Secondary | ICD-10-CM | POA: Diagnosis not present

## 2023-04-06 DIAGNOSIS — T8140XA Infection following a procedure, unspecified, initial encounter: Secondary | ICD-10-CM | POA: Diagnosis not present

## 2023-04-06 DIAGNOSIS — Z6833 Body mass index (BMI) 33.0-33.9, adult: Secondary | ICD-10-CM | POA: Diagnosis not present

## 2023-04-06 DIAGNOSIS — E785 Hyperlipidemia, unspecified: Secondary | ICD-10-CM | POA: Diagnosis not present

## 2023-04-06 DIAGNOSIS — F909 Attention-deficit hyperactivity disorder, unspecified type: Secondary | ICD-10-CM | POA: Diagnosis not present

## 2023-04-07 DIAGNOSIS — I4891 Unspecified atrial fibrillation: Secondary | ICD-10-CM | POA: Diagnosis not present

## 2023-04-07 DIAGNOSIS — I482 Chronic atrial fibrillation, unspecified: Secondary | ICD-10-CM | POA: Diagnosis not present

## 2023-04-07 DIAGNOSIS — I4819 Other persistent atrial fibrillation: Secondary | ICD-10-CM | POA: Diagnosis not present

## 2023-04-07 DIAGNOSIS — I959 Hypotension, unspecified: Secondary | ICD-10-CM | POA: Diagnosis not present

## 2023-04-07 DIAGNOSIS — Z952 Presence of prosthetic heart valve: Secondary | ICD-10-CM | POA: Diagnosis not present

## 2023-04-07 DIAGNOSIS — F4323 Adjustment disorder with mixed anxiety and depressed mood: Secondary | ICD-10-CM | POA: Diagnosis not present

## 2023-04-07 DIAGNOSIS — I447 Left bundle-branch block, unspecified: Secondary | ICD-10-CM | POA: Diagnosis not present

## 2023-04-07 DIAGNOSIS — T8141XA Infection following a procedure, superficial incisional surgical site, initial encounter: Secondary | ICD-10-CM | POA: Diagnosis not present

## 2023-04-07 DIAGNOSIS — Z792 Long term (current) use of antibiotics: Secondary | ICD-10-CM | POA: Diagnosis not present

## 2023-04-07 DIAGNOSIS — T8149XD Infection following a procedure, other surgical site, subsequent encounter: Secondary | ICD-10-CM | POA: Diagnosis not present

## 2023-04-07 DIAGNOSIS — Z743 Need for continuous supervision: Secondary | ICD-10-CM | POA: Diagnosis not present

## 2023-04-07 DIAGNOSIS — D72829 Elevated white blood cell count, unspecified: Secondary | ICD-10-CM | POA: Diagnosis not present

## 2023-04-07 DIAGNOSIS — I110 Hypertensive heart disease with heart failure: Secondary | ICD-10-CM | POA: Diagnosis not present

## 2023-04-07 DIAGNOSIS — R509 Fever, unspecified: Secondary | ICD-10-CM | POA: Diagnosis not present

## 2023-04-07 DIAGNOSIS — I509 Heart failure, unspecified: Secondary | ICD-10-CM | POA: Diagnosis not present

## 2023-04-07 DIAGNOSIS — Z79899 Other long term (current) drug therapy: Secondary | ICD-10-CM | POA: Diagnosis not present

## 2023-04-07 DIAGNOSIS — L02211 Cutaneous abscess of abdominal wall: Secondary | ICD-10-CM | POA: Diagnosis not present

## 2023-04-07 DIAGNOSIS — E785 Hyperlipidemia, unspecified: Secondary | ICD-10-CM | POA: Diagnosis not present

## 2023-04-07 DIAGNOSIS — I422 Other hypertrophic cardiomyopathy: Secondary | ICD-10-CM | POA: Diagnosis not present

## 2023-04-07 DIAGNOSIS — K828 Other specified diseases of gallbladder: Secondary | ICD-10-CM | POA: Diagnosis not present

## 2023-04-07 DIAGNOSIS — D649 Anemia, unspecified: Secondary | ICD-10-CM | POA: Diagnosis not present

## 2023-04-07 DIAGNOSIS — R188 Other ascites: Secondary | ICD-10-CM | POA: Diagnosis not present

## 2023-04-07 DIAGNOSIS — R9431 Abnormal electrocardiogram [ECG] [EKG]: Secondary | ICD-10-CM | POA: Diagnosis not present

## 2023-04-07 DIAGNOSIS — I2510 Atherosclerotic heart disease of native coronary artery without angina pectoris: Secondary | ICD-10-CM | POA: Diagnosis not present

## 2023-04-07 DIAGNOSIS — Z9049 Acquired absence of other specified parts of digestive tract: Secondary | ICD-10-CM | POA: Diagnosis not present

## 2023-04-07 DIAGNOSIS — T8140XA Infection following a procedure, unspecified, initial encounter: Secondary | ICD-10-CM | POA: Diagnosis not present

## 2023-04-07 DIAGNOSIS — T81321A Disruption or dehiscence of closure of internal operation (surgical) wound of abdominal wall muscle or fascia, initial encounter: Secondary | ICD-10-CM | POA: Diagnosis not present

## 2023-04-07 DIAGNOSIS — D6869 Other thrombophilia: Secondary | ICD-10-CM | POA: Diagnosis not present

## 2023-04-07 DIAGNOSIS — Z7901 Long term (current) use of anticoagulants: Secondary | ICD-10-CM | POA: Diagnosis not present

## 2023-04-07 DIAGNOSIS — S31109A Unspecified open wound of abdominal wall, unspecified quadrant without penetration into peritoneal cavity, initial encounter: Secondary | ICD-10-CM | POA: Diagnosis not present

## 2023-04-07 DIAGNOSIS — R569 Unspecified convulsions: Secondary | ICD-10-CM | POA: Diagnosis not present

## 2023-04-07 DIAGNOSIS — I080 Rheumatic disorders of both mitral and aortic valves: Secondary | ICD-10-CM | POA: Diagnosis not present

## 2023-04-07 DIAGNOSIS — T8149XA Infection following a procedure, other surgical site, initial encounter: Secondary | ICD-10-CM | POA: Diagnosis not present

## 2023-04-07 DIAGNOSIS — I480 Paroxysmal atrial fibrillation: Secondary | ICD-10-CM | POA: Diagnosis not present

## 2023-04-07 DIAGNOSIS — Z5181 Encounter for therapeutic drug level monitoring: Secondary | ICD-10-CM | POA: Diagnosis not present

## 2023-04-07 DIAGNOSIS — Z8679 Personal history of other diseases of the circulatory system: Secondary | ICD-10-CM | POA: Diagnosis not present

## 2023-04-07 DIAGNOSIS — K7689 Other specified diseases of liver: Secondary | ICD-10-CM | POA: Diagnosis not present

## 2023-04-07 DIAGNOSIS — Z933 Colostomy status: Secondary | ICD-10-CM | POA: Diagnosis not present

## 2023-04-07 DIAGNOSIS — Z8673 Personal history of transient ischemic attack (TIA), and cerebral infarction without residual deficits: Secondary | ICD-10-CM | POA: Diagnosis not present

## 2023-04-07 DIAGNOSIS — Z9889 Other specified postprocedural states: Secondary | ICD-10-CM | POA: Diagnosis not present

## 2023-04-07 DIAGNOSIS — I421 Obstructive hypertrophic cardiomyopathy: Secondary | ICD-10-CM | POA: Diagnosis not present

## 2023-04-18 DIAGNOSIS — Z79891 Long term (current) use of opiate analgesic: Secondary | ICD-10-CM | POA: Diagnosis not present

## 2023-04-18 DIAGNOSIS — G4733 Obstructive sleep apnea (adult) (pediatric): Secondary | ICD-10-CM | POA: Diagnosis not present

## 2023-04-18 DIAGNOSIS — L02211 Cutaneous abscess of abdominal wall: Secondary | ICD-10-CM | POA: Diagnosis not present

## 2023-04-18 DIAGNOSIS — K219 Gastro-esophageal reflux disease without esophagitis: Secondary | ICD-10-CM | POA: Diagnosis not present

## 2023-04-18 DIAGNOSIS — E669 Obesity, unspecified: Secondary | ICD-10-CM | POA: Diagnosis not present

## 2023-04-18 DIAGNOSIS — G43909 Migraine, unspecified, not intractable, without status migrainosus: Secondary | ICD-10-CM | POA: Diagnosis not present

## 2023-04-18 DIAGNOSIS — E785 Hyperlipidemia, unspecified: Secondary | ICD-10-CM | POA: Diagnosis not present

## 2023-04-18 DIAGNOSIS — H8109 Meniere's disease, unspecified ear: Secondary | ICD-10-CM | POA: Diagnosis not present

## 2023-04-18 DIAGNOSIS — F909 Attention-deficit hyperactivity disorder, unspecified type: Secondary | ICD-10-CM | POA: Diagnosis not present

## 2023-04-18 DIAGNOSIS — D649 Anemia, unspecified: Secondary | ICD-10-CM | POA: Diagnosis not present

## 2023-04-18 DIAGNOSIS — I509 Heart failure, unspecified: Secondary | ICD-10-CM | POA: Diagnosis not present

## 2023-04-18 DIAGNOSIS — I4892 Unspecified atrial flutter: Secondary | ICD-10-CM | POA: Diagnosis not present

## 2023-04-18 DIAGNOSIS — F900 Attention-deficit hyperactivity disorder, predominantly inattentive type: Secondary | ICD-10-CM | POA: Diagnosis not present

## 2023-04-18 DIAGNOSIS — I422 Other hypertrophic cardiomyopathy: Secondary | ICD-10-CM | POA: Diagnosis not present

## 2023-04-18 DIAGNOSIS — Z9181 History of falling: Secondary | ICD-10-CM | POA: Diagnosis not present

## 2023-04-18 DIAGNOSIS — I447 Left bundle-branch block, unspecified: Secondary | ICD-10-CM | POA: Diagnosis not present

## 2023-04-18 DIAGNOSIS — D6869 Other thrombophilia: Secondary | ICD-10-CM | POA: Diagnosis not present

## 2023-04-18 DIAGNOSIS — I4819 Other persistent atrial fibrillation: Secondary | ICD-10-CM | POA: Diagnosis not present

## 2023-04-18 DIAGNOSIS — R569 Unspecified convulsions: Secondary | ICD-10-CM | POA: Diagnosis not present

## 2023-04-18 DIAGNOSIS — J45909 Unspecified asthma, uncomplicated: Secondary | ICD-10-CM | POA: Diagnosis not present

## 2023-04-18 DIAGNOSIS — F411 Generalized anxiety disorder: Secondary | ICD-10-CM | POA: Diagnosis not present

## 2023-04-18 DIAGNOSIS — F4323 Adjustment disorder with mixed anxiety and depressed mood: Secondary | ICD-10-CM | POA: Diagnosis not present

## 2023-04-18 DIAGNOSIS — L910 Hypertrophic scar: Secondary | ICD-10-CM | POA: Diagnosis not present

## 2023-04-18 DIAGNOSIS — Z6828 Body mass index (BMI) 28.0-28.9, adult: Secondary | ICD-10-CM | POA: Diagnosis not present

## 2023-04-18 DIAGNOSIS — T8149XD Infection following a procedure, other surgical site, subsequent encounter: Secondary | ICD-10-CM | POA: Diagnosis not present

## 2023-04-18 DIAGNOSIS — Z79899 Other long term (current) drug therapy: Secondary | ICD-10-CM | POA: Diagnosis not present

## 2023-04-18 DIAGNOSIS — D696 Thrombocytopenia, unspecified: Secondary | ICD-10-CM | POA: Diagnosis not present

## 2023-04-18 DIAGNOSIS — I69311 Memory deficit following cerebral infarction: Secondary | ICD-10-CM | POA: Diagnosis not present

## 2023-04-18 DIAGNOSIS — F3341 Major depressive disorder, recurrent, in partial remission: Secondary | ICD-10-CM | POA: Diagnosis not present

## 2023-04-18 DIAGNOSIS — I110 Hypertensive heart disease with heart failure: Secondary | ICD-10-CM | POA: Diagnosis not present

## 2023-04-20 DIAGNOSIS — I509 Heart failure, unspecified: Secondary | ICD-10-CM | POA: Diagnosis not present

## 2023-04-20 DIAGNOSIS — I110 Hypertensive heart disease with heart failure: Secondary | ICD-10-CM | POA: Diagnosis not present

## 2023-04-20 DIAGNOSIS — I4819 Other persistent atrial fibrillation: Secondary | ICD-10-CM | POA: Diagnosis not present

## 2023-04-20 DIAGNOSIS — D696 Thrombocytopenia, unspecified: Secondary | ICD-10-CM | POA: Diagnosis not present

## 2023-04-20 DIAGNOSIS — I4892 Unspecified atrial flutter: Secondary | ICD-10-CM | POA: Diagnosis not present

## 2023-04-20 DIAGNOSIS — T8149XD Infection following a procedure, other surgical site, subsequent encounter: Secondary | ICD-10-CM | POA: Diagnosis not present

## 2023-04-23 DIAGNOSIS — T8149XD Infection following a procedure, other surgical site, subsequent encounter: Secondary | ICD-10-CM | POA: Diagnosis not present

## 2023-04-23 DIAGNOSIS — I110 Hypertensive heart disease with heart failure: Secondary | ICD-10-CM | POA: Diagnosis not present

## 2023-04-23 DIAGNOSIS — I4819 Other persistent atrial fibrillation: Secondary | ICD-10-CM | POA: Diagnosis not present

## 2023-04-23 DIAGNOSIS — I4892 Unspecified atrial flutter: Secondary | ICD-10-CM | POA: Diagnosis not present

## 2023-04-23 DIAGNOSIS — I509 Heart failure, unspecified: Secondary | ICD-10-CM | POA: Diagnosis not present

## 2023-04-23 DIAGNOSIS — D696 Thrombocytopenia, unspecified: Secondary | ICD-10-CM | POA: Diagnosis not present

## 2023-04-25 DIAGNOSIS — I110 Hypertensive heart disease with heart failure: Secondary | ICD-10-CM | POA: Diagnosis not present

## 2023-04-25 DIAGNOSIS — I4819 Other persistent atrial fibrillation: Secondary | ICD-10-CM | POA: Diagnosis not present

## 2023-04-25 DIAGNOSIS — I4892 Unspecified atrial flutter: Secondary | ICD-10-CM | POA: Diagnosis not present

## 2023-04-25 DIAGNOSIS — D696 Thrombocytopenia, unspecified: Secondary | ICD-10-CM | POA: Diagnosis not present

## 2023-04-25 DIAGNOSIS — T8149XD Infection following a procedure, other surgical site, subsequent encounter: Secondary | ICD-10-CM | POA: Diagnosis not present

## 2023-04-25 DIAGNOSIS — I509 Heart failure, unspecified: Secondary | ICD-10-CM | POA: Diagnosis not present

## 2023-04-27 DIAGNOSIS — I110 Hypertensive heart disease with heart failure: Secondary | ICD-10-CM | POA: Diagnosis not present

## 2023-04-27 DIAGNOSIS — I4891 Unspecified atrial fibrillation: Secondary | ICD-10-CM | POA: Diagnosis not present

## 2023-04-27 DIAGNOSIS — E785 Hyperlipidemia, unspecified: Secondary | ICD-10-CM | POA: Diagnosis not present

## 2023-04-27 DIAGNOSIS — Z7901 Long term (current) use of anticoagulants: Secondary | ICD-10-CM | POA: Diagnosis not present

## 2023-04-27 DIAGNOSIS — T8131XA Disruption of external operation (surgical) wound, not elsewhere classified, initial encounter: Secondary | ICD-10-CM | POA: Diagnosis not present

## 2023-04-27 DIAGNOSIS — E663 Overweight: Secondary | ICD-10-CM | POA: Diagnosis not present

## 2023-04-27 DIAGNOSIS — T8149XA Infection following a procedure, other surgical site, initial encounter: Secondary | ICD-10-CM | POA: Diagnosis not present

## 2023-04-27 DIAGNOSIS — I509 Heart failure, unspecified: Secondary | ICD-10-CM | POA: Diagnosis not present

## 2023-04-27 DIAGNOSIS — F909 Attention-deficit hyperactivity disorder, unspecified type: Secondary | ICD-10-CM | POA: Diagnosis not present

## 2023-04-27 DIAGNOSIS — Z6832 Body mass index (BMI) 32.0-32.9, adult: Secondary | ICD-10-CM | POA: Diagnosis not present

## 2023-04-28 DIAGNOSIS — I110 Hypertensive heart disease with heart failure: Secondary | ICD-10-CM | POA: Diagnosis not present

## 2023-04-28 DIAGNOSIS — T8149XD Infection following a procedure, other surgical site, subsequent encounter: Secondary | ICD-10-CM | POA: Diagnosis not present

## 2023-04-28 DIAGNOSIS — I509 Heart failure, unspecified: Secondary | ICD-10-CM | POA: Diagnosis not present

## 2023-04-28 DIAGNOSIS — I4819 Other persistent atrial fibrillation: Secondary | ICD-10-CM | POA: Diagnosis not present

## 2023-04-28 DIAGNOSIS — D696 Thrombocytopenia, unspecified: Secondary | ICD-10-CM | POA: Diagnosis not present

## 2023-04-28 DIAGNOSIS — I4892 Unspecified atrial flutter: Secondary | ICD-10-CM | POA: Diagnosis not present

## 2023-04-30 DIAGNOSIS — D696 Thrombocytopenia, unspecified: Secondary | ICD-10-CM | POA: Diagnosis not present

## 2023-04-30 DIAGNOSIS — I422 Other hypertrophic cardiomyopathy: Secondary | ICD-10-CM | POA: Diagnosis not present

## 2023-04-30 DIAGNOSIS — I480 Paroxysmal atrial fibrillation: Secondary | ICD-10-CM | POA: Diagnosis not present

## 2023-04-30 DIAGNOSIS — K219 Gastro-esophageal reflux disease without esophagitis: Secondary | ICD-10-CM | POA: Diagnosis not present

## 2023-04-30 DIAGNOSIS — L02211 Cutaneous abscess of abdominal wall: Secondary | ICD-10-CM | POA: Diagnosis not present

## 2023-04-30 DIAGNOSIS — I110 Hypertensive heart disease with heart failure: Secondary | ICD-10-CM | POA: Diagnosis not present

## 2023-04-30 DIAGNOSIS — I509 Heart failure, unspecified: Secondary | ICD-10-CM | POA: Diagnosis not present

## 2023-04-30 DIAGNOSIS — I447 Left bundle-branch block, unspecified: Secondary | ICD-10-CM | POA: Diagnosis not present

## 2023-04-30 DIAGNOSIS — D6869 Other thrombophilia: Secondary | ICD-10-CM | POA: Diagnosis not present

## 2023-04-30 DIAGNOSIS — I4892 Unspecified atrial flutter: Secondary | ICD-10-CM | POA: Diagnosis not present

## 2023-04-30 DIAGNOSIS — I4819 Other persistent atrial fibrillation: Secondary | ICD-10-CM | POA: Diagnosis not present

## 2023-04-30 DIAGNOSIS — Z952 Presence of prosthetic heart valve: Secondary | ICD-10-CM | POA: Diagnosis not present

## 2023-04-30 DIAGNOSIS — T8149XD Infection following a procedure, other surgical site, subsequent encounter: Secondary | ICD-10-CM | POA: Diagnosis not present

## 2023-04-30 DIAGNOSIS — E785 Hyperlipidemia, unspecified: Secondary | ICD-10-CM | POA: Diagnosis not present

## 2023-05-01 DIAGNOSIS — D696 Thrombocytopenia, unspecified: Secondary | ICD-10-CM | POA: Diagnosis not present

## 2023-05-01 DIAGNOSIS — I509 Heart failure, unspecified: Secondary | ICD-10-CM | POA: Diagnosis not present

## 2023-05-01 DIAGNOSIS — T8149XD Infection following a procedure, other surgical site, subsequent encounter: Secondary | ICD-10-CM | POA: Diagnosis not present

## 2023-05-01 DIAGNOSIS — I110 Hypertensive heart disease with heart failure: Secondary | ICD-10-CM | POA: Diagnosis not present

## 2023-05-01 DIAGNOSIS — I4892 Unspecified atrial flutter: Secondary | ICD-10-CM | POA: Diagnosis not present

## 2023-05-01 DIAGNOSIS — I4819 Other persistent atrial fibrillation: Secondary | ICD-10-CM | POA: Diagnosis not present

## 2023-05-02 ENCOUNTER — Ambulatory Visit (INDEPENDENT_AMBULATORY_CARE_PROVIDER_SITE_OTHER): Payer: Medicare Other | Admitting: Family Medicine

## 2023-05-02 ENCOUNTER — Encounter: Payer: Self-pay | Admitting: Family Medicine

## 2023-05-02 VITALS — BP 124/76 | HR 79 | Temp 98.0°F | Wt 202.4 lb

## 2023-05-02 DIAGNOSIS — I509 Heart failure, unspecified: Secondary | ICD-10-CM | POA: Diagnosis not present

## 2023-05-02 DIAGNOSIS — I4892 Unspecified atrial flutter: Secondary | ICD-10-CM | POA: Diagnosis not present

## 2023-05-02 DIAGNOSIS — D696 Thrombocytopenia, unspecified: Secondary | ICD-10-CM | POA: Diagnosis not present

## 2023-05-02 DIAGNOSIS — Z9889 Other specified postprocedural states: Secondary | ICD-10-CM

## 2023-05-02 DIAGNOSIS — I484 Atypical atrial flutter: Secondary | ICD-10-CM | POA: Diagnosis not present

## 2023-05-02 DIAGNOSIS — F3341 Major depressive disorder, recurrent, in partial remission: Secondary | ICD-10-CM | POA: Diagnosis not present

## 2023-05-02 DIAGNOSIS — I69319 Unspecified symptoms and signs involving cognitive functions following cerebral infarction: Secondary | ICD-10-CM | POA: Diagnosis not present

## 2023-05-02 DIAGNOSIS — Z952 Presence of prosthetic heart valve: Secondary | ICD-10-CM | POA: Diagnosis not present

## 2023-05-02 DIAGNOSIS — I4819 Other persistent atrial fibrillation: Secondary | ICD-10-CM | POA: Diagnosis not present

## 2023-05-02 DIAGNOSIS — K5720 Diverticulitis of large intestine with perforation and abscess without bleeding: Secondary | ICD-10-CM

## 2023-05-02 DIAGNOSIS — Z7901 Long term (current) use of anticoagulants: Secondary | ICD-10-CM | POA: Diagnosis not present

## 2023-05-02 DIAGNOSIS — Z8679 Personal history of other diseases of the circulatory system: Secondary | ICD-10-CM | POA: Diagnosis not present

## 2023-05-02 DIAGNOSIS — I69311 Memory deficit following cerebral infarction: Secondary | ICD-10-CM | POA: Diagnosis not present

## 2023-05-02 DIAGNOSIS — F411 Generalized anxiety disorder: Secondary | ICD-10-CM | POA: Diagnosis not present

## 2023-05-02 DIAGNOSIS — T8149XD Infection following a procedure, other surgical site, subsequent encounter: Secondary | ICD-10-CM | POA: Diagnosis not present

## 2023-05-02 DIAGNOSIS — F900 Attention-deficit hyperactivity disorder, predominantly inattentive type: Secondary | ICD-10-CM | POA: Diagnosis not present

## 2023-05-02 DIAGNOSIS — T8149XA Infection following a procedure, other surgical site, initial encounter: Secondary | ICD-10-CM

## 2023-05-02 DIAGNOSIS — I110 Hypertensive heart disease with heart failure: Secondary | ICD-10-CM | POA: Diagnosis not present

## 2023-05-02 DIAGNOSIS — Z79899 Other long term (current) drug therapy: Secondary | ICD-10-CM | POA: Diagnosis not present

## 2023-05-02 DIAGNOSIS — R5381 Other malaise: Secondary | ICD-10-CM | POA: Diagnosis not present

## 2023-05-02 NOTE — Progress Notes (Unsigned)
Ph: [phone] Fax: [phone]   Patient ID: Dylan Fletcher, male    DOB: 01-Jun-1961, 62 y.o.   MRN: [id number]  This visit was conducted in person.  BP 124/76 (BP Location: Right Arm, Patient Position: Sitting, Cuff Size: Large)   Pulse 79   Temp 98 F (36.7 C) (Tympanic)   Wt 202 lb 6.4 oz (91.8 kg)   SpO2 97%   BMI 33.42 kg/m    CC: hosp f/u visit  Subjective:   HPI: Dylan Fletcher is a 62 y.o. male presenting on 05/02/2023 for Hospitalization Follow-up (Patient states that he has been exhausted since the hospital visit. )   Recent hospitalization at [facility] for abdominal pain found to have perforated sigmoid colon divertiulitis that occurred 2d after ablation for aflutter, treated with Hartman's procedure (midline proctosigmoidoscopy) on 03/21/2023 that was complicated by wound dehiscence and abscess s/p OR I&D by gen surgery on 04/12/2023.  Hospital records reviewed. Med rec performed.  Concern for HIT - instead placed on ***. Hematology consulted - tested negative for HIT.  Discharged with augmentin course which he has completed.  Now has colostomy - discussing colonoscopy and possible reversal 06/2023.  Continues pantoprazole 40mg  daily - newly started in hospital. No h/o GERD.   Discharged with wound vac to abdominal wound healing by second intention, followed by general surgery.  Coumadin continues to be followed by cardiology coumadin clinic, last checked 04/30/2023 and INR was 3.9.   [facility], nursing (wound care 3d/wk).  Other follow up appointments scheduled: wound care 11/25, cardiology 06/25/2023, gen surgery 07/04/2023, endo 08/02/2023  Since home, staying very fatigued. He continues working with HHPT, also walking his backyard 4 laps daily, as well as active with HEP recommended by PT.  Appetite ok. 20 lb weight loss during these events.  Itchy rash developing around wound. Possibly allergic to tegaderm dressing.   Already saw  cardiology for atypical aflutter s/p catheter ablation 03/19/2023 Sherryll Burger) ______________________________________________________________________ Hospital admission: 03/19/2023 Hospital discharge: 04/03/2023 TCM f/u phone call: not performed   Discharge Diagnosis  1. Persistent afib 2. S/p PVI ablation 3. Perforated colon s/p exploratory laparatomy and Harman's procedure 4. Primary HTN 5. HCM 6. Mechanical MVR 7. Hx of CVA 8. Secondary hypercoagulable state 9. Thrombocytopenia      Relevant past medical, surgical, family and social history reviewed and updated as indicated. Interim medical history since our last visit reviewed. Allergies and medications reviewed and updated. Outpatient Medications Prior to Visit  Medication Sig Dispense Refill   acetaminophen (TYLENOL) 500 MG tablet Take 1 tablet (500 mg total) by mouth 2 (two) times daily.     acetaminophen-codeine (TYLENOL #3) 300-30 MG tablet Take 1 tablet by mouth every 8 (eight) hours as needed for moderate pain. 15 tablet 0   amoxicillin (AMOXIL) 500 MG tablet Take 500 mg by mouth as directed. Takes 4 tablets 1 hour prior to dental procedures     amphetamine-dextroamphetamine (ADDERALL XR) 20 MG 24 hr capsule Take 20 mg by mouth daily. Takes 2 tablets (40 mg) every morning     amphetamine-dextroamphetamine (ADDERALL) 30 MG tablet Take by mouth.     buPROPion (WELLBUTRIN XL) 150 MG 24 hr tablet Take 1 tablet by mouth every morning.     Cholecalciferol 50 MCG (2000 UT) CAPS 1 capsule every day by oral route.     diphenhydrAMINE (BENADRYL) 25 mg capsule Take by mouth.     famotidine (PEPCID) 20 MG tablet Take by mouth.  fenofibrate (TRICOR) 145 MG tablet Take 1 tablet (145 mg total) by mouth daily. 90 tablet 4   furosemide (LASIX) 20 MG tablet Take 1 tablet (20 mg total) by mouth daily. With extra prn weight gain     Glucosamine-Vitamin D 1000-200 MG-UNIT TABS Take 2 tablets by mouth daily.     HYDROcodone bit-homatropine  (HYCODAN) 5-1.5 MG/5ML syrup Take 5 mLs by mouth at bedtime as needed for cough. 120 mL 0   liothyronine (CYTOMEL) 5 MCG tablet Take 2 tablets by mouth daily.     losartan (COZAAR) 100 MG tablet      meclizine (ANTIVERT) 25 MG tablet Take by mouth.     Multiple Vitamin (MULTIVITAMIN) tablet Take 1 tablet by mouth daily.     pantoprazole (PROTONIX) 40 MG tablet Take 1 tablet by mouth daily.     rosuvastatin (CRESTOR) 10 MG tablet Take 10 mg by mouth at bedtime.     sertraline (ZOLOFT) 50 MG tablet Take 50 mg by mouth daily.     sildenafil (VIAGRA) 100 MG tablet Take 1 tablet (100 mg total) by mouth daily as needed for erectile dysfunction. 10 tablet 3   sotalol (BETAPACE) 80 MG tablet Take 80 mg by mouth 2 (two) times daily.     warfarin (COUMADIN) 5 MG tablet Take 5 mg by mouth as directed.      warfarin (COUMADIN) 7.5 MG tablet SMARTSIG:0.5-1 Tablet(s) By Mouth As Directed     amoxicillin-clavulanate (AUGMENTIN) 875-125 MG tablet Take 1 tablet by mouth 2 (two) times daily. (Patient not taking: Reported on 05/02/2023) 14 tablet 0   No facility-administered medications prior to visit.     Per HPI unless specifically indicated in ROS section below Review of Systems  Objective:  BP 124/76 (BP Location: Right Arm, Patient Position: Sitting, Cuff Size: Large)   Pulse 79   Temp 98 F (36.7 C) (Tympanic)   Wt 202 lb 6.4 oz (91.8 kg)   SpO2 97%   BMI 33.42 kg/m   Wt Readings from Last 3 Encounters:  05/02/23 202 lb 6.4 oz (91.8 kg)  03/07/23 220 lb (99.8 kg)  01/31/23 220 lb (99.8 kg)      Physical Exam Vitals and nursing note reviewed.  Constitutional:      Appearance: Normal appearance. He is not ill-appearing.  HENT:     Mouth/Throat:     Mouth: Mucous membranes are moist.     Pharynx: Oropharynx is clear. No oropharyngeal exudate or posterior oropharyngeal erythema.  Cardiovascular:     Rate and Rhythm: Normal rate and regular rhythm.     Pulses: Normal pulses.     Heart  sounds: Murmur (mechanical click) heard.  Pulmonary:     Effort: Pulmonary effort is normal. No respiratory distress.     Breath sounds: Normal breath sounds. No wheezing, rhonchi or rales.  Abdominal:     Comments:  Healing wound with wound vac in place without drainage but there is surrounding erythematous papular rash inferior to wound around tegaderm adhesive  Musculoskeletal:     Right lower leg: No edema.     Left lower leg: No edema.  Skin:    General: Skin is warm and dry.     Findings: No rash.  Neurological:     Mental Status: He is alert.  Psychiatric:        Mood and Affect: Mood normal.        Behavior: Behavior normal.       Results for orders  placed or performed in visit on 01/24/23  PSA, Medicare  Result Value Ref Range   PSA 1.09 0.10 - 4.00 ng/ml  CBC with Differential/Platelet  Result Value Ref Range   WBC 11.7 (H) 4.0 - 10.5 K/uL   RBC 4.63 4.22 - 5.81 Mil/uL   Hemoglobin 14.1 13.0 - 17.0 g/dL   HCT 16.1 09.6 - 04.5 %   MCV 94.5 78.0 - 100.0 fl   MCHC 32.2 30.0 - 36.0 g/dL   RDW 40.9 81.1 - 91.4 %   Platelets 274.0 150.0 - 400.0 K/uL   Neutrophils Relative % 39.1 (L) 43.0 - 77.0 %   Lymphocytes Relative 49.5 (H) 12.0 - 46.0 %   Monocytes Relative 9.4 3.0 - 12.0 %   Eosinophils Relative 1.6 0.0 - 5.0 %   Basophils Relative 0.4 0.0 - 3.0 %   Neutro Abs 4.6 1.4 - 7.7 K/uL   Lymphs Abs 5.8 (H) 0.7 - 4.0 K/uL   Monocytes Absolute 1.1 (H) 0.1 - 1.0 K/uL   Eosinophils Absolute 0.2 0.0 - 0.7 K/uL   Basophils Absolute 0.0 0.0 - 0.1 K/uL  Testosterone  Result Value Ref Range   Testosterone 235.36 (L) 300.00 - 890.00 ng/dL  TSH  Result Value Ref Range   TSH 0.88 0.35 - 5.50 uIU/mL  VITAMIN D 25 Hydroxy (Vit-D Deficiency, Fractures)  Result Value Ref Range   VITD 61.91 30.00 - 100.00 ng/mL  Comprehensive metabolic panel  Result Value Ref Range   Sodium 139 135 - 145 mEq/L   Potassium 4.2 3.5 - 5.1 mEq/L   Chloride 103 96 - 112 mEq/L   CO2 30 19 - 32  mEq/L   Glucose, Bld 104 (H) 70 - 99 mg/dL   BUN 22 6 - 23 mg/dL   Creatinine, Ser 7.82 0.40 - 1.50 mg/dL   Total Bilirubin 0.5 0.2 - 1.2 mg/dL   Alkaline Phosphatase 26 (L) 39 - 117 U/L   AST 26 0 - 37 U/L   ALT 19 0 - 53 U/L   Total Protein 6.4 6.0 - 8.3 g/dL   Albumin 4.3 3.5 - 5.2 g/dL   GFR 95.62 (L) >13.08 mL/min   Calcium 9.3 8.4 - 10.5 mg/dL  Lipid panel  Result Value Ref Range   Cholesterol 137 0 - 200 mg/dL   Triglycerides 65.7 0.0 - 149.0 mg/dL   HDL 84.69 >62.95 mg/dL   VLDL 28.4 0.0 - 13.2 mg/dL   LDL Cholesterol 78 0 - 99 mg/dL   Total CHOL/HDL Ratio 3    NonHDL 97.28     Assessment & Plan:   Problem List Items Addressed This Visit   None    No orders of the defined types were placed in this encounter.   No orders of the defined types were placed in this encounter.   There are no Patient Instructions on file for this visit.  Follow up plan: No follow-ups on file.  Eustaquio Boyden, MD

## 2023-05-02 NOTE — Patient Instructions (Addendum)
Good to see you today Continue working with home health Return in February for previously scheduled appointment

## 2023-05-03 DIAGNOSIS — I421 Obstructive hypertrophic cardiomyopathy: Secondary | ICD-10-CM | POA: Diagnosis not present

## 2023-05-03 DIAGNOSIS — K5720 Diverticulitis of large intestine with perforation and abscess without bleeding: Secondary | ICD-10-CM | POA: Insufficient documentation

## 2023-05-03 DIAGNOSIS — Z952 Presence of prosthetic heart valve: Secondary | ICD-10-CM | POA: Diagnosis not present

## 2023-05-03 DIAGNOSIS — I480 Paroxysmal atrial fibrillation: Secondary | ICD-10-CM | POA: Diagnosis not present

## 2023-05-03 DIAGNOSIS — T8149XA Infection following a procedure, other surgical site, initial encounter: Secondary | ICD-10-CM | POA: Insufficient documentation

## 2023-05-03 DIAGNOSIS — G4730 Sleep apnea, unspecified: Secondary | ICD-10-CM | POA: Diagnosis not present

## 2023-05-03 DIAGNOSIS — R5381 Other malaise: Secondary | ICD-10-CM | POA: Insufficient documentation

## 2023-05-03 DIAGNOSIS — I4819 Other persistent atrial fibrillation: Secondary | ICD-10-CM | POA: Diagnosis not present

## 2023-05-03 NOTE — Assessment & Plan Note (Signed)
S/p surgery, complicated by wound abscess/infection, now healing via second intention with wound vac.  Appreciate surgery care. He will follow up with them 06/2023 for colonoscopy and subsequent care.

## 2023-05-03 NOTE — Assessment & Plan Note (Signed)
Discussed 20 lb weight loss in setting of recent hospitalizations.

## 2023-05-03 NOTE — Assessment & Plan Note (Signed)
Appreciate surgery and wound nurse care. Continue wound care with wound vac per [facility] wound nurse.

## 2023-05-03 NOTE — Assessment & Plan Note (Addendum)
After recent prolonged hospitalizations however he is working hard with [facility] PT and continues improving.

## 2023-05-03 NOTE — Assessment & Plan Note (Signed)
S/p recent ablation, sotalol continued. Continue cardiology f/u.

## 2023-05-04 DIAGNOSIS — I509 Heart failure, unspecified: Secondary | ICD-10-CM | POA: Diagnosis not present

## 2023-05-04 DIAGNOSIS — I4819 Other persistent atrial fibrillation: Secondary | ICD-10-CM | POA: Diagnosis not present

## 2023-05-04 DIAGNOSIS — T8149XD Infection following a procedure, other surgical site, subsequent encounter: Secondary | ICD-10-CM | POA: Diagnosis not present

## 2023-05-04 DIAGNOSIS — D696 Thrombocytopenia, unspecified: Secondary | ICD-10-CM | POA: Diagnosis not present

## 2023-05-04 DIAGNOSIS — I4892 Unspecified atrial flutter: Secondary | ICD-10-CM | POA: Diagnosis not present

## 2023-05-04 DIAGNOSIS — I110 Hypertensive heart disease with heart failure: Secondary | ICD-10-CM | POA: Diagnosis not present

## 2023-05-07 DIAGNOSIS — I4892 Unspecified atrial flutter: Secondary | ICD-10-CM | POA: Diagnosis not present

## 2023-05-07 DIAGNOSIS — I110 Hypertensive heart disease with heart failure: Secondary | ICD-10-CM | POA: Diagnosis not present

## 2023-05-07 DIAGNOSIS — T8149XD Infection following a procedure, other surgical site, subsequent encounter: Secondary | ICD-10-CM | POA: Diagnosis not present

## 2023-05-07 DIAGNOSIS — I4819 Other persistent atrial fibrillation: Secondary | ICD-10-CM | POA: Diagnosis not present

## 2023-05-07 DIAGNOSIS — I509 Heart failure, unspecified: Secondary | ICD-10-CM | POA: Diagnosis not present

## 2023-05-07 DIAGNOSIS — D696 Thrombocytopenia, unspecified: Secondary | ICD-10-CM | POA: Diagnosis not present

## 2023-05-08 DIAGNOSIS — T8149XD Infection following a procedure, other surgical site, subsequent encounter: Secondary | ICD-10-CM | POA: Diagnosis not present

## 2023-05-08 DIAGNOSIS — I4819 Other persistent atrial fibrillation: Secondary | ICD-10-CM | POA: Diagnosis not present

## 2023-05-08 DIAGNOSIS — D696 Thrombocytopenia, unspecified: Secondary | ICD-10-CM | POA: Diagnosis not present

## 2023-05-08 DIAGNOSIS — I4892 Unspecified atrial flutter: Secondary | ICD-10-CM | POA: Diagnosis not present

## 2023-05-08 DIAGNOSIS — I110 Hypertensive heart disease with heart failure: Secondary | ICD-10-CM | POA: Diagnosis not present

## 2023-05-08 DIAGNOSIS — I509 Heart failure, unspecified: Secondary | ICD-10-CM | POA: Diagnosis not present

## 2023-05-09 DIAGNOSIS — I509 Heart failure, unspecified: Secondary | ICD-10-CM | POA: Diagnosis not present

## 2023-05-09 DIAGNOSIS — D696 Thrombocytopenia, unspecified: Secondary | ICD-10-CM | POA: Diagnosis not present

## 2023-05-09 DIAGNOSIS — T8149XD Infection following a procedure, other surgical site, subsequent encounter: Secondary | ICD-10-CM | POA: Diagnosis not present

## 2023-05-09 DIAGNOSIS — I110 Hypertensive heart disease with heart failure: Secondary | ICD-10-CM | POA: Diagnosis not present

## 2023-05-09 DIAGNOSIS — I4819 Other persistent atrial fibrillation: Secondary | ICD-10-CM | POA: Diagnosis not present

## 2023-05-09 DIAGNOSIS — I4892 Unspecified atrial flutter: Secondary | ICD-10-CM | POA: Diagnosis not present

## 2023-05-10 DIAGNOSIS — I4891 Unspecified atrial fibrillation: Secondary | ICD-10-CM | POA: Diagnosis not present

## 2023-05-11 DIAGNOSIS — I110 Hypertensive heart disease with heart failure: Secondary | ICD-10-CM | POA: Diagnosis not present

## 2023-05-11 DIAGNOSIS — I4892 Unspecified atrial flutter: Secondary | ICD-10-CM | POA: Diagnosis not present

## 2023-05-11 DIAGNOSIS — I4819 Other persistent atrial fibrillation: Secondary | ICD-10-CM | POA: Diagnosis not present

## 2023-05-11 DIAGNOSIS — T8149XD Infection following a procedure, other surgical site, subsequent encounter: Secondary | ICD-10-CM | POA: Diagnosis not present

## 2023-05-11 DIAGNOSIS — D696 Thrombocytopenia, unspecified: Secondary | ICD-10-CM | POA: Diagnosis not present

## 2023-05-11 DIAGNOSIS — I509 Heart failure, unspecified: Secondary | ICD-10-CM | POA: Diagnosis not present

## 2023-05-14 DIAGNOSIS — I4891 Unspecified atrial fibrillation: Secondary | ICD-10-CM | POA: Diagnosis not present

## 2023-05-14 DIAGNOSIS — I509 Heart failure, unspecified: Secondary | ICD-10-CM | POA: Diagnosis not present

## 2023-05-14 DIAGNOSIS — T8131XA Disruption of external operation (surgical) wound, not elsewhere classified, initial encounter: Secondary | ICD-10-CM | POA: Diagnosis not present

## 2023-05-14 DIAGNOSIS — Z7901 Long term (current) use of anticoagulants: Secondary | ICD-10-CM | POA: Diagnosis not present

## 2023-05-14 DIAGNOSIS — I110 Hypertensive heart disease with heart failure: Secondary | ICD-10-CM | POA: Diagnosis not present

## 2023-05-14 DIAGNOSIS — T8149XA Infection following a procedure, other surgical site, initial encounter: Secondary | ICD-10-CM | POA: Diagnosis not present

## 2023-05-15 DIAGNOSIS — T8149XD Infection following a procedure, other surgical site, subsequent encounter: Secondary | ICD-10-CM | POA: Diagnosis not present

## 2023-05-15 DIAGNOSIS — I509 Heart failure, unspecified: Secondary | ICD-10-CM | POA: Diagnosis not present

## 2023-05-15 DIAGNOSIS — D696 Thrombocytopenia, unspecified: Secondary | ICD-10-CM | POA: Diagnosis not present

## 2023-05-15 DIAGNOSIS — I4892 Unspecified atrial flutter: Secondary | ICD-10-CM | POA: Diagnosis not present

## 2023-05-15 DIAGNOSIS — I4819 Other persistent atrial fibrillation: Secondary | ICD-10-CM | POA: Diagnosis not present

## 2023-05-15 DIAGNOSIS — I110 Hypertensive heart disease with heart failure: Secondary | ICD-10-CM | POA: Diagnosis not present

## 2023-05-16 DIAGNOSIS — I4819 Other persistent atrial fibrillation: Secondary | ICD-10-CM | POA: Diagnosis not present

## 2023-05-16 DIAGNOSIS — I110 Hypertensive heart disease with heart failure: Secondary | ICD-10-CM | POA: Diagnosis not present

## 2023-05-16 DIAGNOSIS — T8149XD Infection following a procedure, other surgical site, subsequent encounter: Secondary | ICD-10-CM | POA: Diagnosis not present

## 2023-05-16 DIAGNOSIS — I4892 Unspecified atrial flutter: Secondary | ICD-10-CM | POA: Diagnosis not present

## 2023-05-16 DIAGNOSIS — D696 Thrombocytopenia, unspecified: Secondary | ICD-10-CM | POA: Diagnosis not present

## 2023-05-16 DIAGNOSIS — I509 Heart failure, unspecified: Secondary | ICD-10-CM | POA: Diagnosis not present

## 2023-05-18 DIAGNOSIS — I110 Hypertensive heart disease with heart failure: Secondary | ICD-10-CM | POA: Diagnosis not present

## 2023-05-18 DIAGNOSIS — F4323 Adjustment disorder with mixed anxiety and depressed mood: Secondary | ICD-10-CM | POA: Diagnosis not present

## 2023-05-18 DIAGNOSIS — L02211 Cutaneous abscess of abdominal wall: Secondary | ICD-10-CM | POA: Diagnosis not present

## 2023-05-18 DIAGNOSIS — I4892 Unspecified atrial flutter: Secondary | ICD-10-CM | POA: Diagnosis not present

## 2023-05-18 DIAGNOSIS — I509 Heart failure, unspecified: Secondary | ICD-10-CM | POA: Diagnosis not present

## 2023-05-18 DIAGNOSIS — E669 Obesity, unspecified: Secondary | ICD-10-CM | POA: Diagnosis not present

## 2023-05-18 DIAGNOSIS — Z9181 History of falling: Secondary | ICD-10-CM | POA: Diagnosis not present

## 2023-05-18 DIAGNOSIS — G4733 Obstructive sleep apnea (adult) (pediatric): Secondary | ICD-10-CM | POA: Diagnosis not present

## 2023-05-18 DIAGNOSIS — L910 Hypertrophic scar: Secondary | ICD-10-CM | POA: Diagnosis not present

## 2023-05-18 DIAGNOSIS — Z6828 Body mass index (BMI) 28.0-28.9, adult: Secondary | ICD-10-CM | POA: Diagnosis not present

## 2023-05-18 DIAGNOSIS — T8149XD Infection following a procedure, other surgical site, subsequent encounter: Secondary | ICD-10-CM | POA: Diagnosis not present

## 2023-05-18 DIAGNOSIS — Z79891 Long term (current) use of opiate analgesic: Secondary | ICD-10-CM | POA: Diagnosis not present

## 2023-05-18 DIAGNOSIS — R569 Unspecified convulsions: Secondary | ICD-10-CM | POA: Diagnosis not present

## 2023-05-18 DIAGNOSIS — E785 Hyperlipidemia, unspecified: Secondary | ICD-10-CM | POA: Diagnosis not present

## 2023-05-18 DIAGNOSIS — D649 Anemia, unspecified: Secondary | ICD-10-CM | POA: Diagnosis not present

## 2023-05-18 DIAGNOSIS — K219 Gastro-esophageal reflux disease without esophagitis: Secondary | ICD-10-CM | POA: Diagnosis not present

## 2023-05-18 DIAGNOSIS — I422 Other hypertrophic cardiomyopathy: Secondary | ICD-10-CM | POA: Diagnosis not present

## 2023-05-18 DIAGNOSIS — I4819 Other persistent atrial fibrillation: Secondary | ICD-10-CM | POA: Diagnosis not present

## 2023-05-18 DIAGNOSIS — J45909 Unspecified asthma, uncomplicated: Secondary | ICD-10-CM | POA: Diagnosis not present

## 2023-05-18 DIAGNOSIS — G43909 Migraine, unspecified, not intractable, without status migrainosus: Secondary | ICD-10-CM | POA: Diagnosis not present

## 2023-05-18 DIAGNOSIS — D696 Thrombocytopenia, unspecified: Secondary | ICD-10-CM | POA: Diagnosis not present

## 2023-05-18 DIAGNOSIS — D6869 Other thrombophilia: Secondary | ICD-10-CM | POA: Diagnosis not present

## 2023-05-18 DIAGNOSIS — H8109 Meniere's disease, unspecified ear: Secondary | ICD-10-CM | POA: Diagnosis not present

## 2023-05-18 DIAGNOSIS — F909 Attention-deficit hyperactivity disorder, unspecified type: Secondary | ICD-10-CM | POA: Diagnosis not present

## 2023-05-18 DIAGNOSIS — I447 Left bundle-branch block, unspecified: Secondary | ICD-10-CM | POA: Diagnosis not present

## 2023-05-22 DIAGNOSIS — I110 Hypertensive heart disease with heart failure: Secondary | ICD-10-CM | POA: Diagnosis not present

## 2023-05-22 DIAGNOSIS — T8149XD Infection following a procedure, other surgical site, subsequent encounter: Secondary | ICD-10-CM | POA: Diagnosis not present

## 2023-05-22 DIAGNOSIS — I4819 Other persistent atrial fibrillation: Secondary | ICD-10-CM | POA: Diagnosis not present

## 2023-05-22 DIAGNOSIS — I509 Heart failure, unspecified: Secondary | ICD-10-CM | POA: Diagnosis not present

## 2023-05-22 DIAGNOSIS — I4892 Unspecified atrial flutter: Secondary | ICD-10-CM | POA: Diagnosis not present

## 2023-05-22 DIAGNOSIS — D696 Thrombocytopenia, unspecified: Secondary | ICD-10-CM | POA: Diagnosis not present

## 2023-05-23 DIAGNOSIS — T8149XD Infection following a procedure, other surgical site, subsequent encounter: Secondary | ICD-10-CM | POA: Diagnosis not present

## 2023-05-23 DIAGNOSIS — I4819 Other persistent atrial fibrillation: Secondary | ICD-10-CM | POA: Diagnosis not present

## 2023-05-23 DIAGNOSIS — I509 Heart failure, unspecified: Secondary | ICD-10-CM | POA: Diagnosis not present

## 2023-05-23 DIAGNOSIS — D696 Thrombocytopenia, unspecified: Secondary | ICD-10-CM | POA: Diagnosis not present

## 2023-05-23 DIAGNOSIS — I4892 Unspecified atrial flutter: Secondary | ICD-10-CM | POA: Diagnosis not present

## 2023-05-23 DIAGNOSIS — I110 Hypertensive heart disease with heart failure: Secondary | ICD-10-CM | POA: Diagnosis not present

## 2023-05-28 DIAGNOSIS — Z933 Colostomy status: Secondary | ICD-10-CM | POA: Diagnosis not present

## 2023-05-28 DIAGNOSIS — T8131XA Disruption of external operation (surgical) wound, not elsewhere classified, initial encounter: Secondary | ICD-10-CM | POA: Diagnosis not present

## 2023-05-29 DIAGNOSIS — D696 Thrombocytopenia, unspecified: Secondary | ICD-10-CM | POA: Diagnosis not present

## 2023-05-29 DIAGNOSIS — I509 Heart failure, unspecified: Secondary | ICD-10-CM | POA: Diagnosis not present

## 2023-05-29 DIAGNOSIS — I4819 Other persistent atrial fibrillation: Secondary | ICD-10-CM | POA: Diagnosis not present

## 2023-05-29 DIAGNOSIS — I110 Hypertensive heart disease with heart failure: Secondary | ICD-10-CM | POA: Diagnosis not present

## 2023-05-29 DIAGNOSIS — T8149XD Infection following a procedure, other surgical site, subsequent encounter: Secondary | ICD-10-CM | POA: Diagnosis not present

## 2023-05-29 DIAGNOSIS — I4892 Unspecified atrial flutter: Secondary | ICD-10-CM | POA: Diagnosis not present

## 2023-06-05 DIAGNOSIS — F411 Generalized anxiety disorder: Secondary | ICD-10-CM | POA: Diagnosis not present

## 2023-06-05 DIAGNOSIS — I69311 Memory deficit following cerebral infarction: Secondary | ICD-10-CM | POA: Diagnosis not present

## 2023-06-05 DIAGNOSIS — F3341 Major depressive disorder, recurrent, in partial remission: Secondary | ICD-10-CM | POA: Diagnosis not present

## 2023-06-05 DIAGNOSIS — F900 Attention-deficit hyperactivity disorder, predominantly inattentive type: Secondary | ICD-10-CM | POA: Diagnosis not present

## 2023-06-05 DIAGNOSIS — Z79899 Other long term (current) drug therapy: Secondary | ICD-10-CM | POA: Diagnosis not present

## 2023-06-06 DIAGNOSIS — I509 Heart failure, unspecified: Secondary | ICD-10-CM | POA: Diagnosis not present

## 2023-06-06 DIAGNOSIS — D696 Thrombocytopenia, unspecified: Secondary | ICD-10-CM | POA: Diagnosis not present

## 2023-06-06 DIAGNOSIS — I4892 Unspecified atrial flutter: Secondary | ICD-10-CM | POA: Diagnosis not present

## 2023-06-06 DIAGNOSIS — I110 Hypertensive heart disease with heart failure: Secondary | ICD-10-CM | POA: Diagnosis not present

## 2023-06-06 DIAGNOSIS — T8149XD Infection following a procedure, other surgical site, subsequent encounter: Secondary | ICD-10-CM | POA: Diagnosis not present

## 2023-06-06 DIAGNOSIS — I4819 Other persistent atrial fibrillation: Secondary | ICD-10-CM | POA: Diagnosis not present

## 2023-06-07 DIAGNOSIS — Z952 Presence of prosthetic heart valve: Secondary | ICD-10-CM | POA: Diagnosis not present

## 2023-06-24 DIAGNOSIS — R059 Cough, unspecified: Secondary | ICD-10-CM | POA: Diagnosis not present

## 2023-06-24 DIAGNOSIS — R509 Fever, unspecified: Secondary | ICD-10-CM | POA: Diagnosis not present

## 2023-07-05 DIAGNOSIS — I4891 Unspecified atrial fibrillation: Secondary | ICD-10-CM | POA: Diagnosis not present

## 2023-07-16 DIAGNOSIS — Z952 Presence of prosthetic heart valve: Secondary | ICD-10-CM | POA: Diagnosis not present

## 2023-07-26 ENCOUNTER — Ambulatory Visit (INDEPENDENT_AMBULATORY_CARE_PROVIDER_SITE_OTHER): Payer: Medicare Other | Admitting: Internal Medicine

## 2023-07-26 VITALS — BP 102/64 | HR 75 | Temp 98.4°F | Resp 99 | Ht 65.5 in | Wt 213.0 lb

## 2023-07-26 DIAGNOSIS — L739 Follicular disorder, unspecified: Secondary | ICD-10-CM | POA: Insufficient documentation

## 2023-07-26 MED ORDER — AMOXICILLIN-POT CLAVULANATE 875-125 MG PO TABS: 1.0000 | ORAL_TABLET | Freq: Two times a day (BID) | ORAL | 0 refills | Status: DC

## 2023-07-26 NOTE — Progress Notes (Signed)
 Subjective:    Patient ID: Dylan Fletcher, male    DOB: [birth year], 63 y.o.   MRN: [id number]  HPI Here due to sore areas under left arm  First noticed pain 4 days ago Has 2 red spots---that are getting bigger Especially bad pain in one spot--the lower one No fever No drainage  Tried heating pad last night for 20 minutes--no help  Current Outpatient Medications on File Prior to Visit  Medication Sig Dispense Refill   acetaminophen  (TYLENOL ) 500 MG tablet Take 1 tablet (500 mg total) by mouth 2 (two) times daily.     acetaminophen -codeine  (TYLENOL  #3) 300-30 MG tablet Take 1 tablet by mouth every 8 (eight) hours as needed for moderate pain. 15 tablet 0   amoxicillin  (AMOXIL ) 500 MG tablet Take 500 mg by mouth as directed. Takes 4 tablets 1 hour prior to dental procedures     amoxicillin -clavulanate (AUGMENTIN ) 875-125 MG tablet Take 1 tablet by mouth 2 (two) times daily. 14 tablet 0   amphetamine-dextroamphetamine (ADDERALL XR) 20 MG 24 hr capsule Take 20 mg by mouth daily. Takes 2 tablets (40 mg) every morning     amphetamine-dextroamphetamine (ADDERALL) 30 MG tablet Take by mouth.     buPROPion (WELLBUTRIN XL) 150 MG 24 hr tablet Take 1 tablet by mouth every morning.     Cholecalciferol 50 MCG (2000 UT) CAPS 1 capsule every day by oral route.     diphenhydrAMINE (BENADRYL) 25 mg capsule Take by mouth.     famotidine (PEPCID) 20 MG tablet Take by mouth.     fenofibrate  (TRICOR ) 145 MG tablet Take 1 tablet (145 mg total) by mouth daily. 90 tablet 4   furosemide (LASIX) 20 MG tablet Take 1 tablet (20 mg total) by mouth daily. With extra prn weight gain     Glucosamine-Vitamin D  1000-200 MG-UNIT TABS Take 2 tablets by mouth daily.     HYDROcodone  bit-homatropine (HYCODAN) 5-1.5 MG/5ML syrup Take 5 mLs by mouth at bedtime as needed for cough. 120 mL 0   liothyronine (CYTOMEL) 5 MCG tablet Take 2 tablets by mouth daily.     losartan (COZAAR) 100 MG tablet      meclizine (ANTIVERT) 25  MG tablet Take by mouth.     Multiple Vitamin (MULTIVITAMIN) tablet Take 1 tablet by mouth daily.     rosuvastatin (CRESTOR) 10 MG tablet Take 10 mg by mouth at bedtime.     sertraline (ZOLOFT) 50 MG tablet Take 50 mg by mouth daily.     sildenafil  (VIAGRA ) 100 MG tablet Take 1 tablet (100 mg total) by mouth daily as needed for erectile dysfunction. 10 tablet 3   sotalol (BETAPACE) 80 MG tablet Take 80 mg by mouth 2 (two) times daily.     warfarin (COUMADIN) 5 MG tablet Take 5 mg by mouth as directed.      warfarin (COUMADIN) 7.5 MG tablet SMARTSIG:0.5-1 Tablet(s) By Mouth As Directed     No current facility-administered medications on file prior to visit.    Allergies  Allergen Reactions   Nicardipine Hcl Shortness Of Breath    Shortness of breath   Nitroglycerin Anaphylaxis   Calcium Channel Blockers Other (See Comments)    Shortness of breath   Molds & Smuts Other (See Comments)    Ragweed, pollen, mold, etc.   Shellfish Allergy Nausea And Vomiting   Tape Rash    Past Medical History:  Diagnosis Date   ADD (attention deficit disorder)    sees psych  in [place], [place] (meds from there)   Allergic rhinitis    Ragweed, mold, mildew   Anxiety associated with depression    sees psych in [place], [place] (meds from there)   Arrhythmia    Claustrophobia    Colon polyp 04/19/2009   rectal tubular adenoma, rec rpt 3 yrs   COVID-19 05/24/2022   CVA (cerebral infarction) 08/17/2013   L thalamic lacunar infarct post CVTS surgery, s/p neuropsychology testing, completed speech therapy. no ASA 2/2 no CAD hx   ED (erectile dysfunction)    H/O mitral valve replacement 08/17/2013   St Jude, needs abx ppx, completed cardiac rehab 12/2013   History of asthma    as child   HLD (hyperlipidemia)    HTN (hypertension)    Hypertrophic obstructive cardiomyopathy(425.11)    Cards (Dr. Jenel [facility] - need abx prophylaxis - now established with [facility]  Dr. Zoran Popovic ([phone]) and  Dr. Meade and Mabel Mulberry s/p myomectomy, now cardiac rehab [facility] 09/2013   LBBB (left bundle branch block) 08/17/2013   after myomectomy   Migraine with aura    Need for prophylactic antibiotic    Obesity    saw nutritionist 12/18/2014   OSA (obstructive sleep apnea)    CPAP at night, up to 16 mmHg (Dr Shona at [facility])   Paroxysmal atrial fibrillation (HCC) 04/2013, 05/2013   with RVR; s/p multiple hospitalizations, spontaneous conversion, referred to Dr. Maree EP, then recurrence - failed sotalol, multaq (both with spont conversions)   Transient alteration of awareness 05/19/2014   during hospitalization pending EEG [facility])   Vitamin D  deficiency    Warfarin anticoagulation    goal INR 2.5-3.5    Past Surgical History:  Procedure Laterality Date   CARDIAC CATHETERIZATION  2014   done for chest pain, no plaque buildup   COLONOSCOPY  04/2009   rectal tubular adenoma x1, rpt 3 yrs   COLONOSCOPY  04/2014   mild diverticulosis, no polyps, rec rpt 5 yrs [facility])   EYE SURGERY  1969; 1971   EYE SURGERY Right 04/2015   for PVD   hospitalization  04/2014   subtherapeutic INR, heparin bridge   INGUINAL HERNIA REPAIR  1987   KNEE ARTHROSCOPY  2006; 2007   right   MITRAL VALVE REPLACEMENT  08/2013   St Jude MV for severe central mitral regurg   MYOMECTOMY  08/22/2013   with MAZE procedure [facility] (Drs Yaakov Meade)   PFTs  07/2013   FVC 79%, FEV1 72%, ratio 0.71 - mild obstruction, o/w normal   VASECTOMY  1992   WISDOM TOOTH EXTRACTION  1977    Family History  Problem Relation Age of Onset   Alcohol abuse Brother    Cancer Mother 15       breast   Ulcers Mother    Heart disease Mother        HOCM   Psoriasis Father    Cancer Maternal Grandfather        colon   Coronary artery disease Paternal Grandfather 74       MI   Diabetes Neg Hx    Stroke Neg Hx     Social History   Socioeconomic History   Marital status: Married    Spouse name: Not on file    Number of children: Not on file   Years of education: Not on file   Highest education level: Bachelor's degree (e.g., BA, AB, BS)  Occupational History   Not on  file  Tobacco Use   Smoking status: Never   Smokeless tobacco: Never  Substance and Sexual Activity   Alcohol use: Yes    Comment: rarely   Drug use: No   Sexual activity: Not on file  Other Topics Concern   Not on file  Social History Narrative   Caffeine: quart of soda/day   Lives with wife ([name]), 3 cats. No children.   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   Edu: Bachelor's degree   Activity: walking several times a week   Diet: good water, fruits/vegetables daily       Cards: Dr Jenel [facility] (HOCM) and Dr Maree [place] (EP), as well as [facility] Dr Gloria Gore   Social Drivers of Health   Financial Resource Strain: Low Risk  (07/26/2023)   Overall Financial Resource Strain (CARDIA)    Difficulty of Paying Living Expenses: Not very hard  Food Insecurity: No Food Insecurity (07/26/2023)   Hunger Vital Sign    Worried About Running Out of Food in the Last Year: Never true    Ran Out of Food in the Last Year: Never true  Transportation Needs: No Transportation Needs (07/26/2023)   PRAPARE - Transportation    Lack of Transportation (Medical): No    Lack of Transportation (Non-Medical): No  Physical Activity: Inactive (07/26/2023)   Exercise Vital Sign    Days of Exercise per Week: 0 days    Minutes of Exercise per Session: 0 min  Stress: Stress Concern Present (07/26/2023)   [employer] of Occupational Health - Occupational Stress Questionnaire    Feeling of Stress : Rather much  Social Connections: Moderately Integrated (07/26/2023)   Social Connection and Isolation Panel [NHANES]    Frequency of Communication with Friends and Family: Once a week    Frequency of Social Gatherings with Friends and Family: Once a week    Attends Religious Services: 1 to 4 times per year    Active Member of [employer] or  Organizations: Yes    Attends Engineer, Structural: More than 4 times per year    Marital Status: Married  Catering Manager Violence: Not At Risk (01/29/2023)   Humiliation, Afraid, Rape, and Kick questionnaire    Fear of Current or Ex-Partner: No    Emotionally Abused: No    Physically Abused: No    Sexually Abused: No   Review of Systems     Objective:   Physical Exam Skin:    Comments: Flat red area in left axilla--top. Not really tender Red area lower down with tenderness and small mass that is tender            Assessment & Plan:

## 2023-07-26 NOTE — Assessment & Plan Note (Addendum)
 Discussed warm compresses to try to get some drainage--discussed that it seems there is some pus there Will try the augmentin  again---875 bid x 7 days Would change to doxy if that doesn't work  Other spot not clearly infection---can try cortaid

## 2023-07-30 DIAGNOSIS — I480 Paroxysmal atrial fibrillation: Secondary | ICD-10-CM | POA: Diagnosis not present

## 2023-08-02 DIAGNOSIS — E291 Testicular hypofunction: Secondary | ICD-10-CM | POA: Diagnosis not present

## 2023-08-02 DIAGNOSIS — R5383 Other fatigue: Secondary | ICD-10-CM | POA: Diagnosis not present

## 2023-08-06 ENCOUNTER — Ambulatory Visit: Payer: Medicare Other | Admitting: Family Medicine

## 2023-08-06 DIAGNOSIS — I480 Paroxysmal atrial fibrillation: Secondary | ICD-10-CM | POA: Diagnosis not present

## 2023-08-06 DIAGNOSIS — I484 Atypical atrial flutter: Secondary | ICD-10-CM | POA: Diagnosis not present

## 2023-08-07 ENCOUNTER — Encounter: Payer: Self-pay | Admitting: Family Medicine

## 2023-08-07 ENCOUNTER — Ambulatory Visit (INDEPENDENT_AMBULATORY_CARE_PROVIDER_SITE_OTHER): Payer: Medicare Other | Admitting: Family Medicine

## 2023-08-07 VITALS — BP 120/64 | HR 70 | Temp 97.7°F | Ht 65.5 in | Wt 216.5 lb

## 2023-08-07 DIAGNOSIS — F3341 Major depressive disorder, recurrent, in partial remission: Secondary | ICD-10-CM | POA: Diagnosis not present

## 2023-08-07 DIAGNOSIS — I6930 Unspecified sequelae of cerebral infarction: Secondary | ICD-10-CM

## 2023-08-07 DIAGNOSIS — Z9889 Other specified postprocedural states: Secondary | ICD-10-CM | POA: Diagnosis not present

## 2023-08-07 DIAGNOSIS — Z79899 Other long term (current) drug therapy: Secondary | ICD-10-CM | POA: Diagnosis not present

## 2023-08-07 DIAGNOSIS — I484 Atypical atrial flutter: Secondary | ICD-10-CM | POA: Diagnosis not present

## 2023-08-07 DIAGNOSIS — Z7901 Long term (current) use of anticoagulants: Secondary | ICD-10-CM

## 2023-08-07 DIAGNOSIS — Z952 Presence of prosthetic heart valve: Secondary | ICD-10-CM | POA: Diagnosis not present

## 2023-08-07 DIAGNOSIS — I480 Paroxysmal atrial fibrillation: Secondary | ICD-10-CM | POA: Diagnosis not present

## 2023-08-07 DIAGNOSIS — K5720 Diverticulitis of large intestine with perforation and abscess without bleeding: Secondary | ICD-10-CM | POA: Diagnosis not present

## 2023-08-07 DIAGNOSIS — F411 Generalized anxiety disorder: Secondary | ICD-10-CM | POA: Diagnosis not present

## 2023-08-07 DIAGNOSIS — I69311 Memory deficit following cerebral infarction: Secondary | ICD-10-CM | POA: Diagnosis not present

## 2023-08-07 DIAGNOSIS — I5032 Chronic diastolic (congestive) heart failure: Secondary | ICD-10-CM | POA: Diagnosis not present

## 2023-08-07 NOTE — Assessment & Plan Note (Signed)
 S/p ablation Sounds regular today Continues coumadin.

## 2023-08-07 NOTE — Assessment & Plan Note (Signed)
 S/p proctosigmoidectomy 03/2023.  Appreciate GI and gen surg care.  Upcoming colostomy take down and colonoscopy - they have upcoming preop with GI.

## 2023-08-07 NOTE — Assessment & Plan Note (Signed)
 Seems euvolemic on daily furosemide

## 2023-08-07 NOTE — Progress Notes (Signed)
 Ph: [phone] Fax: [phone]   Patient ID: Dylan Fletcher, male    DOB: May 14, 1961, 63 y.o.   MRN: [id number]  This visit was conducted in person.  BP 120/64   Pulse 70   Temp 97.7 F (36.5 C) (Oral)   Ht 5' 5.5" (1.664 m)   Wt 216 lb 8 oz (98.2 kg)   SpO2 97%   BMI 35.48 kg/m    CC: 6 mo f/u visit  Subjective:   HPI: Dylan Fletcher is a 63 y.o. male presenting on 08/07/2023 for Medical Management of Chronic Issues (Here for 6 mo f/u. Pt accompanied by wife, [name]. )   HOCM, LBBB, Pafib, s/p ventricular septal myectomy and MVR followed by [facility] cardiology. Known h/o meniere's and monoclonal B-cell gammopathy vs early CLL followed by [facility]ology/oncology Teena Dunk).   Last seen 04/2023 after [facility] hospitalization for perforated sigmoid diverticulum after ablation for aflutter, treated with Hartman's procedure (midline proctosigmoidectomy) also complicated by wound dehiscence and abscess. Wound has fully healed. Released from wound care. Continues HH.   INR goal 2.5-3.5.  Saw gen surgery, planned colostomy takedown and colonoscopy.  Planning to see gastroenterology PA to discuss low sodium prep.   Saw endo O'Connell for hypogonadism/low testosterone, stable period off replacement, recently T levels low so he was restarted on Clomid through compounding pharmacy.   Saw Dr Alphonsus Sias last week for possibly infected cyst under arm - doing better with warm compresses and Augmentin course.   Generally feeling well. Hopeful for warmer weather to get more outdoors physical activity. Still fatigued.      Relevant past medical, surgical, family and social history reviewed and updated as indicated. Interim medical history since our last visit reviewed. Allergies and medications reviewed and updated. Outpatient Medications Prior to Visit  Medication Sig Dispense Refill   acetaminophen (TYLENOL) 500 MG tablet Take 1 tablet (500 mg total) by mouth 2 (two) times daily.      acetaminophen-codeine (TYLENOL #3) 300-30 MG tablet Take 1 tablet by mouth every 8 (eight) hours as needed for moderate pain. 15 tablet 0   amoxicillin (AMOXIL) 500 MG tablet Take 500 mg by mouth as directed. Takes 4 tablets 1 hour prior to dental procedures     amphetamine-dextroamphetamine (ADDERALL XR) 20 MG 24 hr capsule Take 20 mg by mouth daily. Takes 2 tablets (40 mg) every morning     amphetamine-dextroamphetamine (ADDERALL) 30 MG tablet Take by mouth.     buPROPion (WELLBUTRIN XL) 150 MG 24 hr tablet Take 1 tablet by mouth every morning.     Cholecalciferol 50 MCG (2000 UT) CAPS 1 capsule every day by oral route.     clomiPHENE (CLOMID) 50 MG tablet      diphenhydrAMINE (BENADRYL) 25 mg capsule Take by mouth.     famotidine (PEPCID) 20 MG tablet Take by mouth.     fenofibrate (TRICOR) 145 MG tablet Take 1 tablet (145 mg total) by mouth daily. 90 tablet 4   furosemide (LASIX) 20 MG tablet Take 1 tablet (20 mg total) by mouth daily. With extra prn weight gain     Glucosamine-Vitamin D 1000-200 MG-UNIT TABS Take 2 tablets by mouth daily.     HYDROcodone bit-homatropine (HYCODAN) 5-1.5 MG/5ML syrup Take 5 mLs by mouth at bedtime as needed for cough. 120 mL 0   liothyronine (CYTOMEL) 5 MCG tablet Take 2 tablets by mouth daily.     losartan (COZAAR) 100 MG tablet      meclizine (ANTIVERT)  25 MG tablet Take by mouth.     Multiple Vitamin (MULTIVITAMIN) tablet Take 1 tablet by mouth daily.     rosuvastatin (CRESTOR) 10 MG tablet Take 10 mg by mouth at bedtime.     sertraline (ZOLOFT) 50 MG tablet Take 50 mg by mouth daily.     sildenafil (VIAGRA) 100 MG tablet Take 1 tablet (100 mg total) by mouth daily as needed for erectile dysfunction. 10 tablet 3   sotalol (BETAPACE) 80 MG tablet Take 80 mg by mouth 2 (two) times daily.     warfarin (COUMADIN) 5 MG tablet Take 5 mg by mouth as directed.      warfarin (COUMADIN) 7.5 MG tablet SMARTSIG:0.5-1 Tablet(s) By Mouth As Directed      amoxicillin-clavulanate (AUGMENTIN) 875-125 MG tablet Take 1 tablet by mouth 2 (two) times daily. 14 tablet 0   No facility-administered medications prior to visit.     Per HPI unless specifically indicated in ROS section below Review of Systems  Objective:  BP 120/64   Pulse 70   Temp 97.7 F (36.5 C) (Oral)   Ht 5' 5.5" (1.664 m)   Wt 216 lb 8 oz (98.2 kg)   SpO2 97%   BMI 35.48 kg/m   Wt Readings from Last 3 Encounters:  08/07/23 216 lb 8 oz (98.2 kg)  07/26/23 213 lb (96.6 kg)  05/02/23 202 lb 6.4 oz (91.8 kg)      Physical Exam Vitals and nursing note reviewed.  Constitutional:      Appearance: Normal appearance. He is not ill-appearing.  Cardiovascular:     Rate and Rhythm: Normal rate and regular rhythm.     Pulses: Normal pulses.     Heart sounds: Murmur heard.     Comments: Soft mechanical click Pulmonary:     Effort: Pulmonary effort is normal. No respiratory distress.     Breath sounds: Normal breath sounds. No wheezing, rhonchi or rales.  Abdominal:     General: Bowel sounds are normal.     Palpations: Abdomen is soft. There is no mass.     Tenderness: There is no abdominal tenderness.     Comments:  Colostomy present to left lower abd Midline incision largely healed  Musculoskeletal:     Right lower leg: No edema.     Left lower leg: No edema.  Skin:    General: Skin is warm and dry.  Neurological:     Mental Status: He is alert.        Assessment & Plan:   Problem List Items Addressed This Visit     Warfarin anticoagulation   H/O mitral valve replacement   History of cerebrovascular accident (CVA) with residual deficit   Heart failure with preserved ejection fraction (HCC)   Seems euvolemic on daily furosemide      Presence of prosthetic heart valve   S/P ventricular septal myectomy   Atypical atrial flutter (HCC)   S/p ablation Sounds regular today Continues coumadin.       Perforated diverticulum of large intestine - Primary    S/p proctosigmoidectomy 03/2023.  Appreciate GI and gen surg care.  Upcoming colostomy take down and colonoscopy - they have upcoming preop with GI.         No orders of the defined types were placed in this encounter.   No orders of the defined types were placed in this encounter.   Patient Instructions  Good to see you today Return as needed or in 6 months  for wellness visit follow up  Follow up plan: Return in about 6 months (around 02/04/2024) for medicare wellness visit.  Eustaquio Boyden, MD

## 2023-08-07 NOTE — Patient Instructions (Addendum)
 Good to see you today Return as needed or in 6 months for wellness visit follow up

## 2023-08-14 DIAGNOSIS — I480 Paroxysmal atrial fibrillation: Secondary | ICD-10-CM | POA: Diagnosis not present

## 2023-08-15 DIAGNOSIS — I5033 Acute on chronic diastolic (congestive) heart failure: Secondary | ICD-10-CM | POA: Diagnosis not present

## 2023-08-15 DIAGNOSIS — Z860100 Personal history of colon polyps, unspecified: Secondary | ICD-10-CM | POA: Diagnosis not present

## 2023-08-15 DIAGNOSIS — I484 Atypical atrial flutter: Secondary | ICD-10-CM | POA: Diagnosis not present

## 2023-08-15 DIAGNOSIS — Z939 Artificial opening status, unspecified: Secondary | ICD-10-CM | POA: Diagnosis not present

## 2023-08-15 DIAGNOSIS — Z7901 Long term (current) use of anticoagulants: Secondary | ICD-10-CM | POA: Diagnosis not present

## 2023-08-15 DIAGNOSIS — I110 Hypertensive heart disease with heart failure: Secondary | ICD-10-CM | POA: Diagnosis not present

## 2023-08-15 DIAGNOSIS — K5720 Diverticulitis of large intestine with perforation and abscess without bleeding: Secondary | ICD-10-CM | POA: Diagnosis not present

## 2023-08-18 HISTORY — PX: COLONOSCOPY: SHX174

## 2023-08-20 DIAGNOSIS — Z8673 Personal history of transient ischemic attack (TIA), and cerebral infarction without residual deficits: Secondary | ICD-10-CM | POA: Diagnosis not present

## 2023-08-20 DIAGNOSIS — I480 Paroxysmal atrial fibrillation: Secondary | ICD-10-CM | POA: Diagnosis not present

## 2023-08-20 DIAGNOSIS — Z952 Presence of prosthetic heart valve: Secondary | ICD-10-CM | POA: Diagnosis not present

## 2023-08-20 DIAGNOSIS — Z01810 Encounter for preprocedural cardiovascular examination: Secondary | ICD-10-CM | POA: Diagnosis not present

## 2023-08-20 DIAGNOSIS — E782 Mixed hyperlipidemia: Secondary | ICD-10-CM | POA: Diagnosis not present

## 2023-08-20 DIAGNOSIS — I421 Obstructive hypertrophic cardiomyopathy: Secondary | ICD-10-CM | POA: Diagnosis not present

## 2023-08-22 DIAGNOSIS — Z952 Presence of prosthetic heart valve: Secondary | ICD-10-CM | POA: Diagnosis not present

## 2023-08-27 DIAGNOSIS — Z933 Colostomy status: Secondary | ICD-10-CM | POA: Diagnosis not present

## 2023-08-27 DIAGNOSIS — Z860100 Personal history of colon polyps, unspecified: Secondary | ICD-10-CM | POA: Diagnosis not present

## 2023-08-27 DIAGNOSIS — I340 Nonrheumatic mitral (valve) insufficiency: Secondary | ICD-10-CM | POA: Diagnosis present

## 2023-08-27 DIAGNOSIS — I110 Hypertensive heart disease with heart failure: Secondary | ICD-10-CM | POA: Diagnosis present

## 2023-08-27 DIAGNOSIS — K219 Gastro-esophageal reflux disease without esophagitis: Secondary | ICD-10-CM | POA: Diagnosis present

## 2023-08-27 DIAGNOSIS — Z6834 Body mass index (BMI) 34.0-34.9, adult: Secondary | ICD-10-CM | POA: Diagnosis not present

## 2023-08-27 DIAGNOSIS — T82897A Other specified complication of cardiac prosthetic devices, implants and grafts, initial encounter: Secondary | ICD-10-CM | POA: Diagnosis not present

## 2023-08-27 DIAGNOSIS — K5790 Diverticulosis of intestine, part unspecified, without perforation or abscess without bleeding: Secondary | ICD-10-CM | POA: Diagnosis not present

## 2023-08-27 DIAGNOSIS — Z888 Allergy status to other drugs, medicaments and biological substances status: Secondary | ICD-10-CM | POA: Diagnosis not present

## 2023-08-27 DIAGNOSIS — K5190 Ulcerative colitis, unspecified, without complications: Secondary | ICD-10-CM | POA: Diagnosis not present

## 2023-08-27 DIAGNOSIS — I4891 Unspecified atrial fibrillation: Secondary | ICD-10-CM | POA: Diagnosis present

## 2023-08-27 DIAGNOSIS — Z952 Presence of prosthetic heart valve: Secondary | ICD-10-CM | POA: Diagnosis not present

## 2023-08-27 DIAGNOSIS — I447 Left bundle-branch block, unspecified: Secondary | ICD-10-CM | POA: Diagnosis present

## 2023-08-27 DIAGNOSIS — I421 Obstructive hypertrophic cardiomyopathy: Secondary | ICD-10-CM | POA: Diagnosis not present

## 2023-08-27 DIAGNOSIS — Z1211 Encounter for screening for malignant neoplasm of colon: Secondary | ICD-10-CM | POA: Diagnosis not present

## 2023-08-27 DIAGNOSIS — E785 Hyperlipidemia, unspecified: Secondary | ICD-10-CM | POA: Diagnosis present

## 2023-08-27 DIAGNOSIS — Z7901 Long term (current) use of anticoagulants: Secondary | ICD-10-CM | POA: Diagnosis not present

## 2023-08-27 DIAGNOSIS — I422 Other hypertrophic cardiomyopathy: Secondary | ICD-10-CM | POA: Diagnosis present

## 2023-08-27 DIAGNOSIS — Z886 Allergy status to analgesic agent status: Secondary | ICD-10-CM | POA: Diagnosis not present

## 2023-08-27 DIAGNOSIS — I2510 Atherosclerotic heart disease of native coronary artery without angina pectoris: Secondary | ICD-10-CM | POA: Diagnosis present

## 2023-08-27 DIAGNOSIS — I69811 Memory deficit following other cerebrovascular disease: Secondary | ICD-10-CM | POA: Diagnosis not present

## 2023-08-27 DIAGNOSIS — Z860101 Personal history of adenomatous and serrated colon polyps: Secondary | ICD-10-CM | POA: Diagnosis not present

## 2023-08-27 DIAGNOSIS — I484 Atypical atrial flutter: Secondary | ICD-10-CM | POA: Diagnosis present

## 2023-08-27 DIAGNOSIS — Z79899 Other long term (current) drug therapy: Secondary | ICD-10-CM | POA: Diagnosis not present

## 2023-08-27 DIAGNOSIS — I509 Heart failure, unspecified: Secondary | ICD-10-CM | POA: Diagnosis present

## 2023-08-27 DIAGNOSIS — E669 Obesity, unspecified: Secondary | ICD-10-CM | POA: Diagnosis present

## 2023-08-27 DIAGNOSIS — I480 Paroxysmal atrial fibrillation: Secondary | ICD-10-CM | POA: Diagnosis not present

## 2023-08-27 DIAGNOSIS — F909 Attention-deficit hyperactivity disorder, unspecified type: Secondary | ICD-10-CM | POA: Diagnosis present

## 2023-08-27 DIAGNOSIS — K5730 Diverticulosis of large intestine without perforation or abscess without bleeding: Secondary | ICD-10-CM | POA: Diagnosis present

## 2023-08-27 DIAGNOSIS — K635 Polyp of colon: Secondary | ICD-10-CM | POA: Diagnosis present

## 2023-08-27 DIAGNOSIS — K5720 Diverticulitis of large intestine with perforation and abscess without bleeding: Secondary | ICD-10-CM | POA: Diagnosis not present

## 2023-08-27 LAB — HM COLONOSCOPY

## 2023-08-28 DIAGNOSIS — F909 Attention-deficit hyperactivity disorder, unspecified type: Secondary | ICD-10-CM | POA: Diagnosis present

## 2023-08-28 DIAGNOSIS — E785 Hyperlipidemia, unspecified: Secondary | ICD-10-CM | POA: Diagnosis present

## 2023-08-28 DIAGNOSIS — Z886 Allergy status to analgesic agent status: Secondary | ICD-10-CM | POA: Diagnosis not present

## 2023-08-28 DIAGNOSIS — I69811 Memory deficit following other cerebrovascular disease: Secondary | ICD-10-CM | POA: Diagnosis not present

## 2023-08-28 DIAGNOSIS — K219 Gastro-esophageal reflux disease without esophagitis: Secondary | ICD-10-CM | POA: Diagnosis present

## 2023-08-28 DIAGNOSIS — Z952 Presence of prosthetic heart valve: Secondary | ICD-10-CM | POA: Diagnosis not present

## 2023-08-28 DIAGNOSIS — I509 Heart failure, unspecified: Secondary | ICD-10-CM | POA: Diagnosis present

## 2023-08-28 DIAGNOSIS — Z888 Allergy status to other drugs, medicaments and biological substances status: Secondary | ICD-10-CM | POA: Diagnosis not present

## 2023-08-28 DIAGNOSIS — K635 Polyp of colon: Secondary | ICD-10-CM | POA: Diagnosis present

## 2023-08-28 DIAGNOSIS — I447 Left bundle-branch block, unspecified: Secondary | ICD-10-CM | POA: Diagnosis present

## 2023-08-28 DIAGNOSIS — Z860101 Personal history of adenomatous and serrated colon polyps: Secondary | ICD-10-CM | POA: Diagnosis not present

## 2023-08-28 DIAGNOSIS — I2510 Atherosclerotic heart disease of native coronary artery without angina pectoris: Secondary | ICD-10-CM | POA: Diagnosis present

## 2023-08-28 DIAGNOSIS — I484 Atypical atrial flutter: Secondary | ICD-10-CM | POA: Diagnosis present

## 2023-08-28 DIAGNOSIS — T82897A Other specified complication of cardiac prosthetic devices, implants and grafts, initial encounter: Secondary | ICD-10-CM | POA: Diagnosis not present

## 2023-08-28 DIAGNOSIS — I4891 Unspecified atrial fibrillation: Secondary | ICD-10-CM | POA: Diagnosis present

## 2023-08-28 DIAGNOSIS — K5190 Ulcerative colitis, unspecified, without complications: Secondary | ICD-10-CM | POA: Diagnosis not present

## 2023-08-28 DIAGNOSIS — I340 Nonrheumatic mitral (valve) insufficiency: Secondary | ICD-10-CM | POA: Diagnosis present

## 2023-08-28 DIAGNOSIS — K5720 Diverticulitis of large intestine with perforation and abscess without bleeding: Secondary | ICD-10-CM | POA: Diagnosis not present

## 2023-08-28 DIAGNOSIS — Z7901 Long term (current) use of anticoagulants: Secondary | ICD-10-CM | POA: Diagnosis not present

## 2023-08-28 DIAGNOSIS — Z860100 Personal history of colon polyps, unspecified: Secondary | ICD-10-CM | POA: Diagnosis not present

## 2023-08-28 DIAGNOSIS — Z933 Colostomy status: Secondary | ICD-10-CM | POA: Diagnosis not present

## 2023-08-28 DIAGNOSIS — K5730 Diverticulosis of large intestine without perforation or abscess without bleeding: Secondary | ICD-10-CM | POA: Diagnosis present

## 2023-08-28 DIAGNOSIS — Z6834 Body mass index (BMI) 34.0-34.9, adult: Secondary | ICD-10-CM | POA: Diagnosis not present

## 2023-08-28 DIAGNOSIS — I421 Obstructive hypertrophic cardiomyopathy: Secondary | ICD-10-CM | POA: Diagnosis not present

## 2023-08-28 DIAGNOSIS — I422 Other hypertrophic cardiomyopathy: Secondary | ICD-10-CM | POA: Diagnosis present

## 2023-08-28 DIAGNOSIS — I110 Hypertensive heart disease with heart failure: Secondary | ICD-10-CM | POA: Diagnosis present

## 2023-08-28 DIAGNOSIS — E669 Obesity, unspecified: Secondary | ICD-10-CM | POA: Diagnosis present

## 2023-08-28 DIAGNOSIS — Z79899 Other long term (current) drug therapy: Secondary | ICD-10-CM | POA: Diagnosis not present

## 2023-08-30 ENCOUNTER — Encounter: Payer: Self-pay | Admitting: Family Medicine

## 2023-09-03 ENCOUNTER — Telehealth: Payer: Self-pay

## 2023-09-03 DIAGNOSIS — I4819 Other persistent atrial fibrillation: Secondary | ICD-10-CM | POA: Diagnosis not present

## 2023-09-03 NOTE — Transitions of Care (Post Inpatient/ED Visit) (Signed)
 09/03/2023  Name: Dylan Fletcher MRN: [id number] DOB: 1960/07/15  Today's TOC FU Call Status: Today's TOC FU Call Status:: Successful TOC FU Call Completed TOC FU Call Complete Date: 09/03/23 Patient's Name and Date of Birth confirmed.  Transition Care Management Follow-up Telephone Call Date of Discharge: 08/31/23 Discharge Facility: Other [facility]) Name of Other ([facility]) Discharge Facility: [facility] Type of Discharge: Inpatient Admission Primary Inpatient Discharge Diagnosis:: S/P MVR How have you been since you were released from the hospital?: Better Any questions or concerns?: No  Items Reviewed: Did you receive and understand the discharge instructions provided?: Yes Medications obtained,verified, and reconciled?: Yes (Medications Reviewed) Any new allergies since your discharge?: No Dietary orders reviewed?: Yes Type of Diet Ordered:: Heart Healthy Do you have support at home?: Yes People in Home: spouse Name of Support/Comfort Primary Source: [name]  Medications Reviewed Today: Medications Reviewed Today     Reviewed by Redge Gainer, RN (Case Manager) on 09/03/23 at 1119  Med List Status: <None>   Medication Order Taking? Sig Documenting Provider Last Dose Status Informant  acetaminophen (TYLENOL) 500 MG tablet [id number] No Take 1 tablet (500 mg total) by mouth 2 (two) times daily. Eustaquio Boyden, MD Taking Active   acetaminophen-codeine (TYLENOL #3) 300-30 MG tablet [id number] No Take 1 tablet by mouth every 8 (eight) hours as needed for moderate pain. Eustaquio Boyden, MD Taking Active   amoxicillin (AMOXIL) 500 MG tablet [id number] No Take 500 mg by mouth as directed. Takes 4 tablets 1 hour prior to dental procedures [provider] Taking Active   amphetamine-dextroamphetamine (ADDERALL XR) 20 MG 24 hr capsule [id number] No Take 20 mg by mouth daily. Takes 2 tablets (40 mg) every morning [provider] Taking Active Self   amphetamine-dextroamphetamine (ADDERALL) 30 MG tablet [id number] No Take by mouth. [provider] Taking Active   buPROPion (WELLBUTRIN XL) 150 MG 24 hr tablet [id number] No Take 1 tablet by mouth every morning. [provider] Taking Active   Cholecalciferol 50 MCG (2000 UT) CAPS [id number] No 1 capsule every day by oral route. [provider] Taking Active   clomiPHENE (CLOMID) 50 MG tablet [id number]   [provider]  Active   diphenhydrAMINE (BENADRYL) 25 mg capsule [id number] No Take by mouth. [provider] Taking Active   famotidine (PEPCID) 20 MG tablet [id number] No Take by mouth. [provider] Taking Active   fenofibrate (TRICOR) 145 MG tablet [id number] No Take 1 tablet (145 mg total) by mouth daily. Eustaquio Boyden, MD Taking Active   furosemide (LASIX) 20 MG tablet [id number] No Take 1 tablet (20 mg total) by mouth daily. With extra prn weight gain Eustaquio Boyden, MD Taking Active   Glucosamine-Vitamin D 1000-200 MG-UNIT TABS [id number] No Take 2 tablets by mouth daily. Eustaquio Boyden, MD Taking Active   HYDROcodone bit-homatropine [facility]) 5-1.5 MG/5ML syrup [id number] No Take 5 mLs by mouth at bedtime as needed for cough. Karie Schwalbe, MD Taking Active   liothyronine (CYTOMEL) 5 MCG tablet [id number] No Take 2 tablets by mouth daily. [provider] Taking Active   losartan (COZAAR) 100 MG tablet [id number] No  [provider] Taking Active   meclizine (ANTIVERT) 25 MG tablet [id number] No Take by mouth. [provider] Taking Active   Multiple Vitamin (MULTIVITAMIN) tablet [id number] No Take 1 tablet by mouth daily. [provider] Taking Active   rosuvastatin (CRESTOR) 10 MG tablet [id number] No Take 10 mg by mouth at bedtime. [provider]  Taking Active   sertraline (ZOLOFT) 50 MG tablet [id number] No Take 50 mg by mouth daily. [provider] Taking Active   sildenafil  (VIAGRA) 100 MG tablet [id number] No Take 1 tablet (100 mg total) by mouth daily as needed for erectile dysfunction. Eustaquio Boyden, MD Taking Active   sotalol (BETAPACE) 80 MG tablet [id number] No Take 80 mg by mouth 2 (two) times daily. [provider] Taking Active   warfarin (COUMADIN) 5 MG tablet [id number] No Take 5 mg by mouth as directed.  [provider] Taking Active   warfarin (COUMADIN) 7.5 MG tablet [id number] No SMARTSIG:0.5-1 Tablet(s) By Mouth As Directed [provider] Taking Active             Home Care and Equipment/Supplies: Were Home Health Services Ordered?: NA Any new equipment or medical supplies ordered?: NA  Functional Questionnaire: Do you need assistance with bathing/showering or dressing?: No Do you need assistance with meal preparation?: Yes Do you need assistance with eating?: No Do you have difficulty maintaining continence: No Do you need assistance with getting out of bed/getting out of a chair/moving?: No Do you have difficulty managing or taking your medications?: No  Follow up appointments reviewed: PCP Follow-up appointment confirmed?: Yes Date of PCP follow-up appointment?: 09/11/23 Follow-up Provider: Eustaquio Boyden Specialist [facility] Follow-up appointment confirmed?: No Reason Specialist Follow-Up Not Confirmed: Patient has Specialist Provider Number and will Call for Appointment Do you need transportation to your follow-up appointment?: No Do you understand care options if your condition(s) worsen?: Yes-patient verbalized understanding  SDOH Interventions Today    Flowsheet Row Most Recent Value  SDOH Interventions   Food Insecurity Interventions Intervention Not Indicated  Housing Interventions Intervention Not Indicated  Transportation Interventions Intervention Not Indicated  Utilities Interventions Intervention Not Indicated      Interventions Today    Flowsheet Row Most Recent Value  Chronic  Disease   Chronic disease during today's visit Atrial Fibrillation (AFib)  General Interventions   General Interventions Discussed/Reviewed General Interventions Discussed, General Interventions Reviewed, Doctor Visits  Doctor Visits Discussed/Reviewed Doctor Visits Reviewed  Exercise Interventions   Exercise Discussed/Reviewed Physical Activity  Physical Activity Discussed/Reviewed Physical Activity Reviewed  Education Interventions   Education Provided Provided Education  Provided Verbal Education On Insurance Plans, Medication, When to see the doctor  Pharmacy Interventions   Pharmacy Dicussed/Reviewed Medications and their functions       The patient has been provided with contact information for the care management team and has been advised to call with any health-related questions or concerns. The patient verbalized understanding with current POC. The patient is directed to their insurance card regarding availability of benefits coverage.   Deidre Ala, BSN, RN [place]  [facility] - [employer] [facility] RN Care Manager [phone]

## 2023-09-11 ENCOUNTER — Ambulatory Visit (INDEPENDENT_AMBULATORY_CARE_PROVIDER_SITE_OTHER): Admitting: Family Medicine

## 2023-09-11 ENCOUNTER — Encounter: Payer: Self-pay | Admitting: Family Medicine

## 2023-09-11 VITALS — BP 122/70 | HR 70 | Temp 97.9°F | Ht 65.5 in | Wt 221.2 lb

## 2023-09-11 DIAGNOSIS — I6930 Unspecified sequelae of cerebral infarction: Secondary | ICD-10-CM | POA: Diagnosis not present

## 2023-09-11 DIAGNOSIS — M109 Gout, unspecified: Secondary | ICD-10-CM

## 2023-09-11 DIAGNOSIS — Z933 Colostomy status: Secondary | ICD-10-CM | POA: Diagnosis not present

## 2023-09-11 DIAGNOSIS — K5720 Diverticulitis of large intestine with perforation and abscess without bleeding: Secondary | ICD-10-CM | POA: Diagnosis not present

## 2023-09-11 DIAGNOSIS — I421 Obstructive hypertrophic cardiomyopathy: Secondary | ICD-10-CM

## 2023-09-11 DIAGNOSIS — Z7901 Long term (current) use of anticoagulants: Secondary | ICD-10-CM | POA: Diagnosis not present

## 2023-09-11 DIAGNOSIS — I480 Paroxysmal atrial fibrillation: Secondary | ICD-10-CM

## 2023-09-11 NOTE — Patient Instructions (Addendum)
 Good to see you today.  Continue current medicines.  Return to see me in August at previously scheduled appointment.

## 2023-09-11 NOTE — Progress Notes (Unsigned)
 Ph: [phone] Fax: [phone]   Patient ID: Dylan Fletcher, male    DOB: 05-08-1961, 63 y.o.   MRN: [id number]  This visit was conducted in person.  BP 122/70   Pulse 70   Temp 97.9 F (36.6 C) (Oral)   Ht 5' 5.5" (1.664 m)   Wt 221 lb 4 oz (100.4 kg)   SpO2 98%   BMI 36.26 kg/m    CC: hosp f/u visit  Subjective:   HPI: Dylan Fletcher is a 63 y.o. male presenting on 09/11/2023 for Hospitalization Follow-up (Admitted on 08/27/23 at [facility], dx status post MAZE operation for afib. Pt accompanied by wife, Dylan Fletcher. )   See prior note for details.  HOCM, LBBB, Pafib, s/p ventricular septal myectomy and MVR followed by [facility] cardiology. Known h/o meniere's and monoclonal B-cell gammopathy vs early CLL followed by [facility] hematology/oncology Dylan Fletcher).   Prior hospitalization 03/2023 for proctosigmoidectomy in setting of perforated diverticulum. Planned robotic lap colostomy takedown 11/2023  Recent hospitalization for elective colonoscopy through colostomy on 3/10/025 - diverticulosis throughout colon, with 10mm polyp removed from sigmoid colon. Admitted for 2 days for IV heparin bridge while off coumadin.  Hospital records reviewed. Med rec performed.  He did well with miralax prep - didn't have trouble with sodium load/meniere's disease exacerbation.   Several weeks ago had episode of what sounds like podagra - significant pain and soreness to L great toe without redness/warmth or swelling - treated with tart cherry juice with benefit.   Home health not set up.  Other follow up appointments scheduled: cardiology 10/09/2023 (Dr Melvyn Neth).  ______________________________________________________________________ Hospital admission: 08/27/2023 Hospital discharge: 08/31/2023 TCM f/u phone call:  performed on 09/03/2023  Discharge Diagnosis  Mechanical MVR  Atrial fibrillation s/p remote Maze procedure x 2 Atypical atrial flutter s/p catheter ablation 03/19/2023 with Dr.  Sherryll Burger LBBB HOCM s/p Myomectomy in 08/2013 Diverticulosis  Ruptured diverticulum 2 days post ablation  History of CVA with short term memory loss  History of rectal tubular adenoma 04/2009  Obesity Body mass index is 34.76 kg/m.   Procedures Completed During This Admission  08/27/2023: colonoscopy  Impression - Diverticulosis in the entire examined colon. - One 10 mm polyp in the sigmoid colon at the level of  closure with sigmoid clips. Biopsied.      Relevant past medical, surgical, family and social history reviewed and updated as indicated. Interim medical history since our last visit reviewed. Allergies and medications reviewed and updated. Outpatient Medications Prior to Visit  Medication Sig Dispense Refill   acetaminophen (TYLENOL) 500 MG tablet Take 1 tablet (500 mg total) by mouth 2 (two) times daily.     acetaminophen-codeine (TYLENOL #3) 300-30 MG tablet Take 1 tablet by mouth every 8 (eight) hours as needed for moderate pain. 15 tablet 0   amoxicillin (AMOXIL) 500 MG tablet Take 500 mg by mouth as directed. Takes 4 tablets 1 hour prior to dental procedures     amphetamine-dextroamphetamine (ADDERALL XR) 20 MG 24 hr capsule Take 20 mg by mouth daily. Takes 2 tablets (40 mg) every morning     amphetamine-dextroamphetamine (ADDERALL) 30 MG tablet Take by mouth.     buPROPion (WELLBUTRIN XL) 150 MG 24 hr tablet Take 1 tablet by mouth every morning.     Cholecalciferol 50 MCG (2000 UT) CAPS 1 capsule every day by oral route.     clomiPHENE (CLOMID) 50 MG tablet      diphenhydrAMINE (BENADRYL) 25 mg capsule Take by mouth.  famotidine (PEPCID) 20 MG tablet Take by mouth.     fenofibrate (TRICOR) 145 MG tablet Take 1 tablet (145 mg total) by mouth daily. 90 tablet 4   furosemide (LASIX) 20 MG tablet Take 1 tablet (20 mg total) by mouth daily. With extra prn weight gain     Glucosamine-Vitamin D 1000-200 MG-UNIT TABS Take 2 tablets by mouth daily.     HYDROcodone bit-homatropine  (HYCODAN) 5-1.5 MG/5ML syrup Take 5 mLs by mouth at bedtime as needed for cough. 120 mL 0   liothyronine (CYTOMEL) 5 MCG tablet Take 2 tablets by mouth daily.     losartan (COZAAR) 100 MG tablet      meclizine (ANTIVERT) 25 MG tablet Take by mouth.     Multiple Vitamin (MULTIVITAMIN) tablet Take 1 tablet by mouth daily.     rosuvastatin (CRESTOR) 10 MG tablet Take 10 mg by mouth at bedtime.     sertraline (ZOLOFT) 50 MG tablet Take 50 mg by mouth daily.     sildenafil (VIAGRA) 100 MG tablet Take 1 tablet (100 mg total) by mouth daily as needed for erectile dysfunction. 10 tablet 3   sotalol (BETAPACE) 80 MG tablet Take 80 mg by mouth 2 (two) times daily.     warfarin (COUMADIN) 5 MG tablet Take 5 mg by mouth as directed.      warfarin (COUMADIN) 7.5 MG tablet SMARTSIG:0.5-1 Tablet(s) By Mouth As Directed     No facility-administered medications prior to visit.     Per HPI unless specifically indicated in ROS section below Review of Systems  Objective:  BP 122/70   Pulse 70   Temp 97.9 F (36.6 C) (Oral)   Ht 5' 5.5" (1.664 m)   Wt 221 lb 4 oz (100.4 kg)   SpO2 98%   BMI 36.26 kg/m   Wt Readings from Last 3 Encounters:  09/11/23 221 lb 4 oz (100.4 kg)  08/07/23 216 lb 8 oz (98.2 kg)  07/26/23 213 lb (96.6 kg)      Physical Exam Vitals and nursing note reviewed.  Constitutional:      Appearance: Normal appearance. He is not ill-appearing.  HENT:     Mouth/Throat:     Mouth: Mucous membranes are moist.     Pharynx: Oropharynx is clear. No oropharyngeal exudate or posterior oropharyngeal erythema.  Eyes:     Extraocular Movements: Extraocular movements intact.     Conjunctiva/sclera: Conjunctivae normal.     Pupils: Pupils are equal, round, and reactive to light.  Cardiovascular:     Rate and Rhythm: Normal rate and regular rhythm.     Pulses: Normal pulses.     Heart sounds: Normal heart sounds. No murmur heard.    Comments: Mechanical click Pulmonary:     Effort:  Pulmonary effort is normal. No respiratory distress.     Breath sounds: Normal breath sounds. No wheezing, rhonchi or rales.  Musculoskeletal:     Right lower leg: No edema.     Left lower leg: No edema.  Skin:    General: Skin is warm and dry.     Findings: No rash.  Neurological:     Mental Status: He is alert.  Psychiatric:        Mood and Affect: Mood normal.        Behavior: Behavior normal.       Results for orders placed or performed in visit on 08/28/23  HM COLONOSCOPY   Collection Time: 08/27/23 12:07 PM  Result Value Ref  Range   HM Colonoscopy See Report (in chart) See Report (in chart), Patient Reported   No results found for: "LABURIC"  Assessment & Plan:   Problem List Items Addressed This Visit     Hypertrophic obstructive cardiomyopathy (HCC)   S/p myectomy and MV repair followed by [facility] cardiology on warfarin.  Did receive bridging while off coumadin prior to procedure.  Did well with recent colonoscopy, hospitalized while coumadin levels returned to therapeutic range.       History of cerebrovascular accident (CVA) with residual deficit   Continues coumadin, statin.       Paroxysmal atrial fibrillation (HCC)   Continues sotalol and coumadin.       Chronic anticoagulation   Did well with recent lovenox bridge followed by hospitalization while coumadin levels returned to therapeutic range.      Perforated diverticulum of large intestine   Podagra   Possible podagra treated with tart cherry juice with benefit.  Consider updated urate levels next labwork.       Colostomy status (HCC) - Primary   Planning colostomy take down 11/2023 - seeing cardiology and surgery regularly.         No orders of the defined types were placed in this encounter.   No orders of the defined types were placed in this encounter.   Patient Instructions  Good to see you today.  Continue current medicines.  Return to see me in August at previously scheduled  appointment.   Follow up plan: Return if symptoms worsen or fail to improve.  Eustaquio Boyden, MD

## 2023-09-12 DIAGNOSIS — M109 Gout, unspecified: Secondary | ICD-10-CM | POA: Insufficient documentation

## 2023-09-12 DIAGNOSIS — Z933 Colostomy status: Secondary | ICD-10-CM | POA: Insufficient documentation

## 2023-09-12 NOTE — Assessment & Plan Note (Signed)
 Continues sotalol and coumadin.

## 2023-09-12 NOTE — Assessment & Plan Note (Addendum)
 S/p myectomy and MV repair followed by [facility] cardiology on warfarin.  Did receive bridging while off coumadin prior to procedure.  Did well with recent colonoscopy, hospitalized while coumadin levels returned to therapeutic range.

## 2023-09-12 NOTE — Assessment & Plan Note (Addendum)
 Did well with recent lovenox bridge followed by hospitalization while coumadin levels returned to therapeutic range.

## 2023-09-12 NOTE — Assessment & Plan Note (Signed)
 Possible podagra treated with tart cherry juice with benefit.  Consider updated urate levels next labwork.

## 2023-09-12 NOTE — Assessment & Plan Note (Signed)
 Planning colostomy take down 11/2023 - seeing cardiology and surgery regularly.

## 2023-09-12 NOTE — Assessment & Plan Note (Addendum)
 Continues coumadin, statin.

## 2023-10-02 DIAGNOSIS — G43009 Migraine without aura, not intractable, without status migrainosus: Secondary | ICD-10-CM | POA: Diagnosis not present

## 2023-10-02 DIAGNOSIS — R4189 Other symptoms and signs involving cognitive functions and awareness: Secondary | ICD-10-CM | POA: Diagnosis not present

## 2023-10-11 DIAGNOSIS — Z952 Presence of prosthetic heart valve: Secondary | ICD-10-CM | POA: Diagnosis not present

## 2023-10-11 DIAGNOSIS — I4891 Unspecified atrial fibrillation: Secondary | ICD-10-CM | POA: Diagnosis not present

## 2023-10-30 DIAGNOSIS — F900 Attention-deficit hyperactivity disorder, predominantly inattentive type: Secondary | ICD-10-CM | POA: Diagnosis not present

## 2023-10-30 DIAGNOSIS — Z79899 Other long term (current) drug therapy: Secondary | ICD-10-CM | POA: Diagnosis not present

## 2023-10-30 DIAGNOSIS — I69311 Memory deficit following cerebral infarction: Secondary | ICD-10-CM | POA: Diagnosis not present

## 2023-10-30 DIAGNOSIS — F3342 Major depressive disorder, recurrent, in full remission: Secondary | ICD-10-CM | POA: Diagnosis not present

## 2023-10-30 DIAGNOSIS — F411 Generalized anxiety disorder: Secondary | ICD-10-CM | POA: Diagnosis not present

## 2023-11-02 DIAGNOSIS — Z01818 Encounter for other preprocedural examination: Secondary | ICD-10-CM | POA: Diagnosis not present

## 2023-11-02 DIAGNOSIS — Z933 Colostomy status: Secondary | ICD-10-CM | POA: Diagnosis not present

## 2023-11-08 DIAGNOSIS — Z952 Presence of prosthetic heart valve: Secondary | ICD-10-CM | POA: Diagnosis not present

## 2023-11-09 DIAGNOSIS — Z01818 Encounter for other preprocedural examination: Secondary | ICD-10-CM | POA: Diagnosis not present

## 2023-11-09 DIAGNOSIS — I480 Paroxysmal atrial fibrillation: Secondary | ICD-10-CM | POA: Diagnosis not present

## 2023-11-09 DIAGNOSIS — R600 Localized edema: Secondary | ICD-10-CM | POA: Diagnosis not present

## 2023-11-26 DIAGNOSIS — Z8719 Personal history of other diseases of the digestive system: Secondary | ICD-10-CM | POA: Diagnosis not present

## 2023-11-26 DIAGNOSIS — I480 Paroxysmal atrial fibrillation: Secondary | ICD-10-CM | POA: Diagnosis not present

## 2023-11-26 DIAGNOSIS — Z933 Colostomy status: Secondary | ICD-10-CM | POA: Diagnosis not present

## 2023-11-29 DIAGNOSIS — G9341 Metabolic encephalopathy: Secondary | ICD-10-CM | POA: Diagnosis not present

## 2023-11-29 DIAGNOSIS — L7632 Postprocedural hematoma of skin and subcutaneous tissue following other procedure: Secondary | ICD-10-CM | POA: Diagnosis not present

## 2023-11-29 DIAGNOSIS — I4891 Unspecified atrial fibrillation: Secondary | ICD-10-CM | POA: Diagnosis not present

## 2023-11-29 DIAGNOSIS — B9562 Methicillin resistant Staphylococcus aureus infection as the cause of diseases classified elsewhere: Secondary | ICD-10-CM | POA: Diagnosis not present

## 2023-11-29 DIAGNOSIS — K5780 Diverticulitis of intestine, part unspecified, with perforation and abscess without bleeding: Secondary | ICD-10-CM | POA: Diagnosis not present

## 2023-11-29 DIAGNOSIS — R509 Fever, unspecified: Secondary | ICD-10-CM | POA: Diagnosis not present

## 2023-11-29 DIAGNOSIS — K9400 Colostomy complication, unspecified: Secondary | ICD-10-CM | POA: Diagnosis not present

## 2023-11-29 DIAGNOSIS — Z8679 Personal history of other diseases of the circulatory system: Secondary | ICD-10-CM | POA: Diagnosis not present

## 2023-11-29 DIAGNOSIS — E785 Hyperlipidemia, unspecified: Secondary | ICD-10-CM | POA: Diagnosis not present

## 2023-11-29 DIAGNOSIS — Z79899 Other long term (current) drug therapy: Secondary | ICD-10-CM | POA: Diagnosis not present

## 2023-11-29 DIAGNOSIS — D696 Thrombocytopenia, unspecified: Secondary | ICD-10-CM | POA: Diagnosis present

## 2023-11-29 DIAGNOSIS — A419 Sepsis, unspecified organism: Secondary | ICD-10-CM | POA: Diagnosis not present

## 2023-11-29 DIAGNOSIS — E869 Volume depletion, unspecified: Secondary | ICD-10-CM | POA: Diagnosis not present

## 2023-11-29 DIAGNOSIS — I484 Atypical atrial flutter: Secondary | ICD-10-CM | POA: Diagnosis not present

## 2023-11-29 DIAGNOSIS — K5730 Diverticulosis of large intestine without perforation or abscess without bleeding: Secondary | ICD-10-CM | POA: Diagnosis not present

## 2023-11-29 DIAGNOSIS — I10 Essential (primary) hypertension: Secondary | ICD-10-CM | POA: Diagnosis not present

## 2023-11-29 DIAGNOSIS — Z952 Presence of prosthetic heart valve: Secondary | ICD-10-CM | POA: Diagnosis not present

## 2023-11-29 DIAGNOSIS — I361 Nonrheumatic tricuspid (valve) insufficiency: Secondary | ICD-10-CM | POA: Diagnosis not present

## 2023-11-29 DIAGNOSIS — G4733 Obstructive sleep apnea (adult) (pediatric): Secondary | ICD-10-CM | POA: Diagnosis not present

## 2023-11-29 DIAGNOSIS — Z433 Encounter for attention to colostomy: Secondary | ICD-10-CM | POA: Diagnosis not present

## 2023-11-29 DIAGNOSIS — N183 Chronic kidney disease, stage 3 unspecified: Secondary | ICD-10-CM | POA: Diagnosis not present

## 2023-11-29 DIAGNOSIS — Z7902 Long term (current) use of antithrombotics/antiplatelets: Secondary | ICD-10-CM | POA: Diagnosis not present

## 2023-11-29 DIAGNOSIS — Z954 Presence of other heart-valve replacement: Secondary | ICD-10-CM | POA: Diagnosis not present

## 2023-11-29 DIAGNOSIS — I4892 Unspecified atrial flutter: Secondary | ICD-10-CM | POA: Diagnosis not present

## 2023-11-29 DIAGNOSIS — R918 Other nonspecific abnormal finding of lung field: Secondary | ICD-10-CM | POA: Diagnosis not present

## 2023-11-29 DIAGNOSIS — I5032 Chronic diastolic (congestive) heart failure: Secondary | ICD-10-CM | POA: Diagnosis not present

## 2023-11-29 DIAGNOSIS — E875 Hyperkalemia: Secondary | ICD-10-CM | POA: Diagnosis not present

## 2023-11-29 DIAGNOSIS — I421 Obstructive hypertrophic cardiomyopathy: Secondary | ICD-10-CM | POA: Diagnosis present

## 2023-11-29 DIAGNOSIS — K7689 Other specified diseases of liver: Secondary | ICD-10-CM | POA: Diagnosis not present

## 2023-11-29 DIAGNOSIS — K626 Ulcer of anus and rectum: Secondary | ICD-10-CM | POA: Diagnosis not present

## 2023-11-29 DIAGNOSIS — N289 Disorder of kidney and ureter, unspecified: Secondary | ICD-10-CM | POA: Diagnosis not present

## 2023-11-29 DIAGNOSIS — Z7901 Long term (current) use of anticoagulants: Secondary | ICD-10-CM | POA: Diagnosis not present

## 2023-11-29 DIAGNOSIS — R6520 Severe sepsis without septic shock: Secondary | ICD-10-CM | POA: Diagnosis not present

## 2023-11-29 DIAGNOSIS — I422 Other hypertrophic cardiomyopathy: Secondary | ICD-10-CM | POA: Diagnosis not present

## 2023-11-29 DIAGNOSIS — I4821 Permanent atrial fibrillation: Secondary | ICD-10-CM | POA: Diagnosis not present

## 2023-11-29 DIAGNOSIS — I480 Paroxysmal atrial fibrillation: Secondary | ICD-10-CM | POA: Diagnosis not present

## 2023-11-29 DIAGNOSIS — F32A Depression, unspecified: Secondary | ICD-10-CM | POA: Diagnosis present

## 2023-11-29 DIAGNOSIS — Z79890 Hormone replacement therapy (postmenopausal): Secondary | ICD-10-CM | POA: Diagnosis not present

## 2023-11-29 DIAGNOSIS — K660 Peritoneal adhesions (postprocedural) (postinfection): Secondary | ICD-10-CM | POA: Diagnosis not present

## 2023-11-29 DIAGNOSIS — J984 Other disorders of lung: Secondary | ICD-10-CM | POA: Diagnosis not present

## 2023-11-29 DIAGNOSIS — C9110 Chronic lymphocytic leukemia of B-cell type not having achieved remission: Secondary | ICD-10-CM | POA: Diagnosis not present

## 2023-11-29 DIAGNOSIS — Z1152 Encounter for screening for COVID-19: Secondary | ICD-10-CM | POA: Diagnosis not present

## 2023-11-29 DIAGNOSIS — Z933 Colostomy status: Secondary | ICD-10-CM | POA: Diagnosis not present

## 2023-11-29 DIAGNOSIS — J9811 Atelectasis: Secondary | ICD-10-CM | POA: Diagnosis not present

## 2023-11-29 DIAGNOSIS — R0989 Other specified symptoms and signs involving the circulatory and respiratory systems: Secondary | ICD-10-CM | POA: Diagnosis not present

## 2023-11-29 DIAGNOSIS — Z792 Long term (current) use of antibiotics: Secondary | ICD-10-CM | POA: Diagnosis not present

## 2023-11-29 DIAGNOSIS — R4182 Altered mental status, unspecified: Secondary | ICD-10-CM | POA: Diagnosis not present

## 2023-11-29 DIAGNOSIS — K668 Other specified disorders of peritoneum: Secondary | ICD-10-CM | POA: Diagnosis not present

## 2023-11-29 DIAGNOSIS — R0902 Hypoxemia: Secondary | ICD-10-CM | POA: Diagnosis not present

## 2023-11-29 DIAGNOSIS — D72820 Lymphocytosis (symptomatic): Secondary | ICD-10-CM | POA: Diagnosis not present

## 2023-11-29 DIAGNOSIS — D62 Acute posthemorrhagic anemia: Secondary | ICD-10-CM | POA: Diagnosis not present

## 2023-11-29 DIAGNOSIS — E66812 Obesity, class 2: Secondary | ICD-10-CM | POA: Diagnosis not present

## 2023-11-29 DIAGNOSIS — D72829 Elevated white blood cell count, unspecified: Secondary | ICD-10-CM | POA: Diagnosis not present

## 2023-11-29 DIAGNOSIS — K5720 Diverticulitis of large intestine with perforation and abscess without bleeding: Secondary | ICD-10-CM | POA: Diagnosis not present

## 2023-11-29 DIAGNOSIS — D689 Coagulation defect, unspecified: Secondary | ICD-10-CM | POA: Diagnosis not present

## 2023-11-29 DIAGNOSIS — M7989 Other specified soft tissue disorders: Secondary | ICD-10-CM | POA: Diagnosis not present

## 2023-11-29 DIAGNOSIS — D6959 Other secondary thrombocytopenia: Secondary | ICD-10-CM | POA: Diagnosis not present

## 2023-11-29 DIAGNOSIS — E039 Hypothyroidism, unspecified: Secondary | ICD-10-CM | POA: Diagnosis present

## 2023-11-29 DIAGNOSIS — Z5331 Laparoscopic surgical procedure converted to open procedure: Secondary | ICD-10-CM | POA: Diagnosis not present

## 2023-11-29 DIAGNOSIS — N179 Acute kidney failure, unspecified: Secondary | ICD-10-CM | POA: Diagnosis present

## 2023-11-29 DIAGNOSIS — D6869 Other thrombophilia: Secondary | ICD-10-CM | POA: Diagnosis not present

## 2023-11-30 DIAGNOSIS — E875 Hyperkalemia: Secondary | ICD-10-CM | POA: Diagnosis not present

## 2023-11-30 DIAGNOSIS — Z79890 Hormone replacement therapy (postmenopausal): Secondary | ICD-10-CM | POA: Diagnosis not present

## 2023-11-30 DIAGNOSIS — E039 Hypothyroidism, unspecified: Secondary | ICD-10-CM | POA: Diagnosis not present

## 2023-11-30 DIAGNOSIS — Z7902 Long term (current) use of antithrombotics/antiplatelets: Secondary | ICD-10-CM | POA: Diagnosis not present

## 2023-11-30 DIAGNOSIS — E785 Hyperlipidemia, unspecified: Secondary | ICD-10-CM | POA: Diagnosis not present

## 2023-11-30 DIAGNOSIS — N179 Acute kidney failure, unspecified: Secondary | ICD-10-CM | POA: Diagnosis not present

## 2023-11-30 DIAGNOSIS — I10 Essential (primary) hypertension: Secondary | ICD-10-CM | POA: Diagnosis not present

## 2023-11-30 DIAGNOSIS — F32A Depression, unspecified: Secondary | ICD-10-CM | POA: Diagnosis not present

## 2023-12-01 DIAGNOSIS — J9811 Atelectasis: Secondary | ICD-10-CM | POA: Diagnosis not present

## 2023-12-01 DIAGNOSIS — D62 Acute posthemorrhagic anemia: Secondary | ICD-10-CM | POA: Diagnosis not present

## 2023-12-01 DIAGNOSIS — Z433 Encounter for attention to colostomy: Secondary | ICD-10-CM | POA: Diagnosis not present

## 2023-12-01 DIAGNOSIS — E869 Volume depletion, unspecified: Secondary | ICD-10-CM | POA: Diagnosis not present

## 2023-12-01 DIAGNOSIS — N179 Acute kidney failure, unspecified: Secondary | ICD-10-CM | POA: Diagnosis not present

## 2023-12-01 DIAGNOSIS — D696 Thrombocytopenia, unspecified: Secondary | ICD-10-CM | POA: Diagnosis not present

## 2023-12-01 DIAGNOSIS — R0902 Hypoxemia: Secondary | ICD-10-CM | POA: Diagnosis not present

## 2023-12-03 DIAGNOSIS — A419 Sepsis, unspecified organism: Secondary | ICD-10-CM | POA: Diagnosis not present

## 2023-12-03 DIAGNOSIS — D62 Acute posthemorrhagic anemia: Secondary | ICD-10-CM | POA: Diagnosis not present

## 2023-12-03 DIAGNOSIS — D696 Thrombocytopenia, unspecified: Secondary | ICD-10-CM | POA: Diagnosis not present

## 2023-12-03 DIAGNOSIS — G9341 Metabolic encephalopathy: Secondary | ICD-10-CM | POA: Diagnosis not present

## 2023-12-03 DIAGNOSIS — M7989 Other specified soft tissue disorders: Secondary | ICD-10-CM | POA: Diagnosis not present

## 2023-12-05 DIAGNOSIS — D696 Thrombocytopenia, unspecified: Secondary | ICD-10-CM | POA: Diagnosis not present

## 2023-12-05 DIAGNOSIS — I421 Obstructive hypertrophic cardiomyopathy: Secondary | ICD-10-CM | POA: Diagnosis not present

## 2023-12-05 DIAGNOSIS — G9341 Metabolic encephalopathy: Secondary | ICD-10-CM | POA: Diagnosis not present

## 2023-12-05 DIAGNOSIS — G4733 Obstructive sleep apnea (adult) (pediatric): Secondary | ICD-10-CM | POA: Diagnosis not present

## 2023-12-05 DIAGNOSIS — I4821 Permanent atrial fibrillation: Secondary | ICD-10-CM | POA: Diagnosis not present

## 2023-12-06 DIAGNOSIS — I361 Nonrheumatic tricuspid (valve) insufficiency: Secondary | ICD-10-CM | POA: Diagnosis not present

## 2023-12-06 DIAGNOSIS — R0989 Other specified symptoms and signs involving the circulatory and respiratory systems: Secondary | ICD-10-CM | POA: Diagnosis not present

## 2023-12-06 DIAGNOSIS — J984 Other disorders of lung: Secondary | ICD-10-CM | POA: Diagnosis not present

## 2023-12-06 DIAGNOSIS — R918 Other nonspecific abnormal finding of lung field: Secondary | ICD-10-CM | POA: Diagnosis not present

## 2023-12-14 ENCOUNTER — Telehealth: Payer: Self-pay | Admitting: *Deleted

## 2023-12-14 NOTE — Telephone Encounter (Signed)
 Noted

## 2023-12-14 NOTE — Telephone Encounter (Signed)
 Copied from CRM [id number]. Topic: General - Other >> Dec 14, 2023 12:45 PM Robinson H wrote: Reason for CRM: Shantia with [facility] calling to notify provider they will be going out to do an assessment on patient for physical and occupational therapy for patient tomorrow.

## 2023-12-15 DIAGNOSIS — I110 Hypertensive heart disease with heart failure: Secondary | ICD-10-CM | POA: Diagnosis not present

## 2023-12-15 DIAGNOSIS — E669 Obesity, unspecified: Secondary | ICD-10-CM | POA: Diagnosis not present

## 2023-12-15 DIAGNOSIS — Z6832 Body mass index (BMI) 32.0-32.9, adult: Secondary | ICD-10-CM | POA: Diagnosis not present

## 2023-12-15 DIAGNOSIS — D72829 Elevated white blood cell count, unspecified: Secondary | ICD-10-CM | POA: Diagnosis not present

## 2023-12-15 DIAGNOSIS — I440 Atrioventricular block, first degree: Secondary | ICD-10-CM | POA: Diagnosis not present

## 2023-12-15 DIAGNOSIS — E785 Hyperlipidemia, unspecified: Secondary | ICD-10-CM | POA: Diagnosis not present

## 2023-12-15 DIAGNOSIS — I5033 Acute on chronic diastolic (congestive) heart failure: Secondary | ICD-10-CM | POA: Diagnosis not present

## 2023-12-15 DIAGNOSIS — I447 Left bundle-branch block, unspecified: Secondary | ICD-10-CM | POA: Diagnosis not present

## 2023-12-15 DIAGNOSIS — K5720 Diverticulitis of large intestine with perforation and abscess without bleeding: Secondary | ICD-10-CM | POA: Diagnosis not present

## 2023-12-15 DIAGNOSIS — G4733 Obstructive sleep apnea (adult) (pediatric): Secondary | ICD-10-CM | POA: Diagnosis not present

## 2023-12-15 DIAGNOSIS — Z433 Encounter for attention to colostomy: Secondary | ICD-10-CM | POA: Diagnosis not present

## 2023-12-15 DIAGNOSIS — F32A Depression, unspecified: Secondary | ICD-10-CM | POA: Diagnosis not present

## 2023-12-15 DIAGNOSIS — M1990 Unspecified osteoarthritis, unspecified site: Secondary | ICD-10-CM | POA: Diagnosis not present

## 2023-12-15 DIAGNOSIS — Z952 Presence of prosthetic heart valve: Secondary | ICD-10-CM | POA: Diagnosis not present

## 2023-12-15 DIAGNOSIS — F909 Attention-deficit hyperactivity disorder, unspecified type: Secondary | ICD-10-CM | POA: Diagnosis not present

## 2023-12-15 DIAGNOSIS — I421 Obstructive hypertrophic cardiomyopathy: Secondary | ICD-10-CM | POA: Diagnosis not present

## 2023-12-15 DIAGNOSIS — Z9049 Acquired absence of other specified parts of digestive tract: Secondary | ICD-10-CM | POA: Diagnosis not present

## 2023-12-15 DIAGNOSIS — Z4801 Encounter for change or removal of surgical wound dressing: Secondary | ICD-10-CM | POA: Diagnosis not present

## 2023-12-15 DIAGNOSIS — K219 Gastro-esophageal reflux disease without esophagitis: Secondary | ICD-10-CM | POA: Diagnosis not present

## 2023-12-15 DIAGNOSIS — Z7901 Long term (current) use of anticoagulants: Secondary | ICD-10-CM | POA: Diagnosis not present

## 2023-12-15 DIAGNOSIS — J45909 Unspecified asthma, uncomplicated: Secondary | ICD-10-CM | POA: Diagnosis not present

## 2023-12-15 DIAGNOSIS — Z8673 Personal history of transient ischemic attack (TIA), and cerebral infarction without residual deficits: Secondary | ICD-10-CM | POA: Diagnosis not present

## 2023-12-15 DIAGNOSIS — Z9852 Vasectomy status: Secondary | ICD-10-CM | POA: Diagnosis not present

## 2023-12-15 DIAGNOSIS — D62 Acute posthemorrhagic anemia: Secondary | ICD-10-CM | POA: Diagnosis not present

## 2023-12-15 DIAGNOSIS — I480 Paroxysmal atrial fibrillation: Secondary | ICD-10-CM | POA: Diagnosis not present

## 2023-12-17 ENCOUNTER — Telehealth: Payer: Self-pay

## 2023-12-17 NOTE — Telephone Encounter (Signed)
 Lvm (on confidential line, per recording) for Jon, of [facility], informing her Dr KANDICE is giving verbal orders for services requested for pt.

## 2023-12-17 NOTE — Telephone Encounter (Signed)
 Copied from CRM [id number]. Topic: Clinical - Home Health Verbal Orders >> Dec 17, 2023  8:28 AM Thersia BROCKS wrote: Caller/Agency: Jon. [facility]  Callback Number: [phone] Service Requested: Skilled Nursing Frequency: 1 time a week for 5 weeks and every other weeks for 4 weeks Any new concerns about the patient? No

## 2023-12-17 NOTE — Telephone Encounter (Signed)
 Agree with this. Thanks.

## 2023-12-17 NOTE — Transitions of Care (Post Inpatient/ED Visit) (Signed)
 12/17/2023  Name: Dylan Fletcher MRN: [id number] DOB: 1960/10/06  Today's TOC FU Call Status: Today's TOC FU Call Status:: Successful TOC FU Call Completed TOC FU Call Complete Date: 12/17/23 Patient's Name and Date of Birth confirmed.  Transition Care Management Follow-up Telephone Call Date of Discharge: 12/14/23 Discharge Facility: Other [facility]) Name of Other (Non[facility]) Discharge Facility: [facility] Type of Discharge: Inpatient Admission Primary Inpatient Discharge Diagnosis:: Colostomy How have you been since you were released from the hospital?:  (He's had a 7 and half hour surgery and still with pain and tired) Any questions or concerns?: No  Items Reviewed: Did you receive and understand the discharge instructions provided?: Yes Medications obtained,verified, and reconciled?: Yes (Medications Reviewed) Any new allergies since your discharge?: No Do you have support at home?: Yes People in Home [RPT]: spouse, other relative(s) Name of Support/Comfort Primary Source: [name] and [name]'s sister lives with them she's a retired Engineer, civil (consulting)  Medications Reviewed Today: Medications Reviewed Today     Reviewed by Eilleen Richerd GRADE, RN (Registered Nurse) on 12/17/23 at 1346  Med List Status: <None>   Medication Order Taking? Sig Documenting Provider Last Dose Status Informant  acetaminophen  (TYLENOL ) 500 MG tablet [id number] No Take 1 tablet (500 mg total) by mouth 2 (two) times daily. Rilla Baller, MD Taking Active   acetaminophen -codeine  (TYLENOL  #3) 300-30 MG tablet [id number] No Take 1 tablet by mouth every 8 (eight) hours as needed for moderate pain. Rilla Baller, MD Taking Active   amoxicillin  (AMOXIL ) 500 MG tablet [id number] No Take 500 mg by mouth as directed. Takes 4 tablets 1 hour prior to dental procedures [provider] Taking Active   amphetamine-dextroamphetamine (ADDERALL XR) 20 MG 24 hr capsule [id number] No Take 20 mg by mouth  daily. Takes 2 tablets (40 mg) every morning [provider] Taking Active Self  amphetamine-dextroamphetamine (ADDERALL) 30 MG tablet [id number] No Take by mouth. [provider] Taking Active   buPROPion (WELLBUTRIN XL) 150 MG 24 hr tablet [id number] No Take 1 tablet by mouth every morning. [provider] Taking Active   Cholecalciferol 50 MCG (2000 UT) CAPS [id number] No 1 capsule every day by oral route. [provider] Taking Active   clomiPHENE (CLOMID) 50 MG tablet [id number] No  [provider] Taking Active   diphenhydrAMINE (BENADRYL) 25 mg capsule [id number] No Take by mouth. [provider] Taking Active   famotidine (PEPCID) 20 MG tablet [id number] No Take by mouth. [provider] Taking Active   fenofibrate  (TRICOR ) 145 MG tablet [id number] No Take 1 tablet (145 mg total) by mouth daily. Rilla Baller, MD Taking Active   furosemide (LASIX) 20 MG tablet [id number] No Take 1 tablet (20 mg total) by mouth daily. With extra prn weight gain Rilla Baller, MD Taking Active   Glucosamine-Vitamin D  1000-200 MG-UNIT TABS [id number] No Take 2 tablets by mouth daily. Rilla Baller, MD Taking Active   HYDROcodone  bit-homatropine [facility]) 5-1.5 MG/5ML syrup [id number] No Take 5 mLs by mouth at bedtime as needed for cough. Jimmy Charlie FERNS, MD Taking Active   liothyronine (CYTOMEL) 5 MCG tablet [id number] No Take 2 tablets by mouth daily. [provider] Taking Active   losartan (COZAAR) 100 MG tablet [id number] No  [provider] Taking Active   meclizine (ANTIVERT) 25 MG tablet [id number] No Take by mouth. [provider] Taking Active   Multiple Vitamin (MULTIVITAMIN) tablet [id number] No Take 1 tablet by mouth daily. [provider] Taking Active  rosuvastatin (CRESTOR) 10 MG tablet [id number] No Take 10 mg by mouth at bedtime. [provider] Taking Active   sertraline (ZOLOFT) 50 MG  tablet [id number] No Take 50 mg by mouth daily. [provider] Taking Active   sildenafil  (VIAGRA ) 100 MG tablet [id number] No Take 1 tablet (100 mg total) by mouth daily as needed for erectile dysfunction. Rilla Baller, MD Taking Active   sotalol (BETAPACE) 80 MG tablet [id number] No Take 80 mg by mouth 2 (two) times daily. [provider] Taking Active   warfarin (COUMADIN) 5 MG tablet [id number] No Take 5 mg by mouth as directed.  [provider] Taking Active   warfarin (COUMADIN) 7.5 MG tablet [id number] No SMARTSIG:0.5-1 Tablet(s) By Mouth As Directed [provider] Taking Active             Home Care and Equipment/Supplies: Were Home Health Services Ordered?: Yes Name of Home Health Agency:: [facility] Has Agency set up a time to come to your home?: Yes First Home Health Visit Date: 12/16/23  Functional Questionnaire: Patient's wife, [name], (on [place])  states patient has cognitive deficits and he was asleep. She agrees to review medications, get assistance for PCP follow up, and appointment review.    Follow up appointments reviewed: PCP Follow-up appointment confirmed?: No (CMA made appointment for 12/31/23 at 11:30 am, wife accepted) Specialist Hospital Follow-up appointment confirmed?: Yes Date of Specialist follow-up appointment?: 12/24/23 Follow-Up Specialty Provider:: Cardiology Do you need transportation to your follow-up appointment?: No Do you understand care options if your condition(s) worsen?: Yes-patient verbalized understanding  *Wife declines 30 day TOC program.  Richerd Fish, RN, BSN, CCM [facility], [facility] RN Care Manager Direct Dial: [phone]

## 2023-12-18 DIAGNOSIS — Z79899 Other long term (current) drug therapy: Secondary | ICD-10-CM | POA: Diagnosis not present

## 2023-12-18 DIAGNOSIS — I69311 Memory deficit following cerebral infarction: Secondary | ICD-10-CM | POA: Diagnosis not present

## 2023-12-18 DIAGNOSIS — F411 Generalized anxiety disorder: Secondary | ICD-10-CM | POA: Diagnosis not present

## 2023-12-18 DIAGNOSIS — F3342 Major depressive disorder, recurrent, in full remission: Secondary | ICD-10-CM | POA: Diagnosis not present

## 2023-12-26 DIAGNOSIS — I5033 Acute on chronic diastolic (congestive) heart failure: Secondary | ICD-10-CM | POA: Diagnosis not present

## 2023-12-26 DIAGNOSIS — I421 Obstructive hypertrophic cardiomyopathy: Secondary | ICD-10-CM | POA: Diagnosis not present

## 2023-12-26 DIAGNOSIS — Z4801 Encounter for change or removal of surgical wound dressing: Secondary | ICD-10-CM | POA: Diagnosis not present

## 2023-12-26 DIAGNOSIS — I110 Hypertensive heart disease with heart failure: Secondary | ICD-10-CM | POA: Diagnosis not present

## 2023-12-26 DIAGNOSIS — Z433 Encounter for attention to colostomy: Secondary | ICD-10-CM | POA: Diagnosis not present

## 2023-12-26 DIAGNOSIS — I480 Paroxysmal atrial fibrillation: Secondary | ICD-10-CM | POA: Diagnosis not present

## 2023-12-28 ENCOUNTER — Inpatient Hospital Stay: Admitting: Family Medicine

## 2023-12-28 DIAGNOSIS — D72820 Lymphocytosis (symptomatic): Secondary | ICD-10-CM | POA: Diagnosis not present

## 2023-12-28 DIAGNOSIS — C9110 Chronic lymphocytic leukemia of B-cell type not having achieved remission: Secondary | ICD-10-CM | POA: Diagnosis not present

## 2023-12-28 DIAGNOSIS — D72829 Elevated white blood cell count, unspecified: Secondary | ICD-10-CM | POA: Diagnosis not present

## 2023-12-31 ENCOUNTER — Ambulatory Visit: Admitting: Family Medicine

## 2023-12-31 ENCOUNTER — Encounter: Payer: Self-pay | Admitting: Family Medicine

## 2023-12-31 VITALS — BP 138/68 | HR 68 | Temp 98.3°F | Ht 65.0 in | Wt 209.4 lb

## 2023-12-31 DIAGNOSIS — D696 Thrombocytopenia, unspecified: Secondary | ICD-10-CM | POA: Diagnosis not present

## 2023-12-31 DIAGNOSIS — Z9889 Other specified postprocedural states: Secondary | ICD-10-CM

## 2023-12-31 DIAGNOSIS — I421 Obstructive hypertrophic cardiomyopathy: Secondary | ICD-10-CM | POA: Diagnosis not present

## 2023-12-31 DIAGNOSIS — I10 Essential (primary) hypertension: Secondary | ICD-10-CM

## 2023-12-31 DIAGNOSIS — I1310 Hypertensive heart and chronic kidney disease without heart failure, with stage 1 through stage 4 chronic kidney disease, or unspecified chronic kidney disease: Secondary | ICD-10-CM

## 2023-12-31 DIAGNOSIS — I5033 Acute on chronic diastolic (congestive) heart failure: Secondary | ICD-10-CM | POA: Diagnosis not present

## 2023-12-31 DIAGNOSIS — Z933 Colostomy status: Secondary | ICD-10-CM

## 2023-12-31 DIAGNOSIS — Z4801 Encounter for change or removal of surgical wound dressing: Secondary | ICD-10-CM | POA: Diagnosis not present

## 2023-12-31 DIAGNOSIS — N1831 Chronic kidney disease, stage 3a: Secondary | ICD-10-CM | POA: Diagnosis not present

## 2023-12-31 DIAGNOSIS — I5032 Chronic diastolic (congestive) heart failure: Secondary | ICD-10-CM | POA: Diagnosis not present

## 2023-12-31 DIAGNOSIS — I480 Paroxysmal atrial fibrillation: Secondary | ICD-10-CM | POA: Diagnosis not present

## 2023-12-31 DIAGNOSIS — K5720 Diverticulitis of large intestine with perforation and abscess without bleeding: Secondary | ICD-10-CM

## 2023-12-31 DIAGNOSIS — F331 Major depressive disorder, recurrent, moderate: Secondary | ICD-10-CM | POA: Diagnosis not present

## 2023-12-31 DIAGNOSIS — R5382 Chronic fatigue, unspecified: Secondary | ICD-10-CM

## 2023-12-31 DIAGNOSIS — I110 Hypertensive heart disease with heart failure: Secondary | ICD-10-CM | POA: Diagnosis not present

## 2023-12-31 DIAGNOSIS — Z433 Encounter for attention to colostomy: Secondary | ICD-10-CM | POA: Diagnosis not present

## 2023-12-31 NOTE — Patient Instructions (Addendum)
 Good to see you today Hang in there! We will update labwork next month at physical.  Keep upcoming appointments

## 2023-12-31 NOTE — Progress Notes (Unsigned)
 Ph: [phone] Fax: [phone]   Patient ID: Dylan Fletcher, male    DOB: 1960-10-22, 63 y.o.   MRN: [id number]  This visit was conducted in person.  BP 138/68   Pulse 68   Temp 98.3 F (36.8 C) (Oral)   Ht 5' 5 (1.651 m)   Wt 209 lb 6 oz (95 kg)   SpO2 98%   BMI 34.84 kg/m    CC: hosp f/u visit  Subjective:   HPI: Willian Fletcher is a 63 y.o. male presenting on 12/31/2023 for Hospitalization Follow-up (Admitted on 11/29/23 at [facility], dx attention to colostomy; acute chronic diastolic CHF; chronic anticoagulation; fatigue. Pt accompanied by wife, [name]. )   See prior notes for details. HOCM, LBBB, Pafib, s/p ventricular septal myectomy and MVR followed by [facility] cardiology. Known h/o meniere's and monoclonal B-cell gammopathy vs early CLL followed by [facility]).   Recent hospitalization for robotic colostomy takedown - converted to open with diverting loop colostomy and midline abdominal hernia closure. Hospitalization course complicated by hypotension thrombocytopenia/anemia requiring transfusions (blood, FFP and platelet) and acute renal failure (Cr 1.9). Negative HIT panel. Had ICU admission. Ultimately was unable to complete takedown.  Hospital records reviewed. Med rec performed.   Has felt exhausted since coming home.  Some temperature intolerance (hot and cold).  Lab Results  Component Value Date   TSH 0.88 01/24/2023  TSH 0.92 (07/2023)  Recent labs from 12/28/2023 reviewed - WBC 10.2, Hgb 11.6, plt 301, Cr 1.76, eGFR 43, BUN 22.  Lab Results  Component Value Date   NA 139 01/24/2023   CL 103 01/24/2023   K 4.2 01/24/2023   CO2 30 01/24/2023   BUN 22 01/24/2023   CREATININE 1.42 01/24/2023   GFR 52.43 (L) 01/24/2023   CALCIUM 9.3 01/24/2023   PHOS 1.9 (L) 04/03/2022   ALBUMIN 4.3 01/24/2023   GLUCOSE 104 (H) 01/24/2023   Losartan 100mg  and furosemide currently on hold.  He has upcoming cardiology appt tomorrow  No  fevers/chills, nausea/vomiting, abd pain.  Drinking high protein shakes.   Home health set up with [facility] - skilled nursing and PT.  Other follow up appointments scheduled: cardiology tomorrow and again 01/09/2024, gen surgery 01/02/2024, endo 02/02/2024 ______________________________________________________________________ Hospital admission: 11/29/2023 Hospital discharge: 12/14/2023 TCM f/u phone call:  performed on 12/17/2023  Discharge Diagnoses: Colostomy take-down s/p Robotic Laparoscopic Colostomy Takedown, open low anterior resection, diverting Loop Ileostomy, repair of midline abdominal wall hernia  Secondary Diagnosis:  Acute blood loss anemia (POA: No) Active Problems: HOCM (hypertrophic obstructive cardiomyopathy) (POA: Yes) Atrial fibrillation (POA: Yes) ADHD (attention deficit hyperactivity disorder) (POA: Yes) S/P MVR (mitral valve replacement) (POA: Not Applicable) Status post myomectomy (POA: Not Applicable) Chronic anticoagulation (POA: Not Applicable) Status post Maze operation for atrial fibrillation (POA: Not Applicable) OSA (obstructive sleep apnea) (POA: Yes) Attention to colostomy (POA: Not Applicable) History of diverticulitis (POA: Not Applicable)      Relevant past medical, surgical, family and social history reviewed and updated as indicated. Interim medical history since our last visit reviewed. Allergies and medications reviewed and updated. Outpatient Medications Prior to Visit  Medication Sig Dispense Refill   acetaminophen  (TYLENOL ) 500 MG tablet Take 1 tablet (500 mg total) by mouth 2 (two) times daily.     acetaminophen -codeine  (TYLENOL  #3) 300-30 MG tablet Take 1 tablet by mouth every 8 (eight) hours as needed for moderate pain. 15 tablet 0   amoxicillin  (AMOXIL ) 500 MG tablet Take 500 mg by mouth as  directed. Takes 4 tablets 1 hour prior to dental procedures     amphetamine-dextroamphetamine (ADDERALL XR) 20 MG 24 hr capsule Take 20 mg by mouth  daily. Takes 2 tablets (40 mg) every morning     amphetamine-dextroamphetamine (ADDERALL) 30 MG tablet Take by mouth.     buPROPion (WELLBUTRIN XL) 150 MG 24 hr tablet Take 1 tablet by mouth every morning.     Cholecalciferol 50 MCG (2000 UT) CAPS 1 capsule every day by oral route.     clomiPHENE (CLOMID) 50 MG tablet      diphenhydrAMINE (BENADRYL) 25 mg capsule Take by mouth.     famotidine (PEPCID) 20 MG tablet Take by mouth.     fenofibrate  (TRICOR ) 145 MG tablet Take 1 tablet (145 mg total) by mouth daily. 90 tablet 4   furosemide (LASIX) 20 MG tablet Take 1 tablet (20 mg total) by mouth daily. With extra prn weight gain     Glucosamine-Vitamin D  1000-200 MG-UNIT TABS Take 2 tablets by mouth daily.     liothyronine (CYTOMEL) 5 MCG tablet Take 2 tablets by mouth daily.     losartan (COZAAR) 100 MG tablet      meclizine (ANTIVERT) 25 MG tablet Take by mouth.     Multiple Vitamin (MULTIVITAMIN) tablet Take 1 tablet by mouth daily.     oxyCODONE  (OXY IR/ROXICODONE ) 5 MG immediate release tablet Take 5 mg by mouth.     rosuvastatin (CRESTOR) 10 MG tablet Take 10 mg by mouth at bedtime.     sertraline (ZOLOFT) 50 MG tablet Take 50 mg by mouth daily.     sildenafil  (VIAGRA ) 100 MG tablet Take 1 tablet (100 mg total) by mouth daily as needed for erectile dysfunction. 10 tablet 3   sotalol (BETAPACE) 80 MG tablet Take 80 mg by mouth 2 (two) times daily.     warfarin (COUMADIN) 5 MG tablet Take 5 mg by mouth as directed.      warfarin (COUMADIN) 7.5 MG tablet SMARTSIG:0.5-1 Tablet(s) By Mouth As Directed     HYDROcodone  bit-homatropine (HYCODAN) 5-1.5 MG/5ML syrup Take 5 mLs by mouth at bedtime as needed for cough. 120 mL 0   No facility-administered medications prior to visit.     Per HPI unless specifically indicated in ROS section below Review of Systems  Objective:  BP 138/68   Pulse 68   Temp 98.3 F (36.8 C) (Oral)   Ht 5' 5 (1.651 m)   Wt 209 lb 6 oz (95 kg)   SpO2 98%   BMI  34.84 kg/m   Wt Readings from Last 3 Encounters:  12/31/23 209 lb 6 oz (95 kg)  09/11/23 221 lb 4 oz (100.4 kg)  08/07/23 216 lb 8 oz (98.2 kg)      Physical Exam Vitals and nursing note reviewed.  Constitutional:      Appearance: Normal appearance. He is not ill-appearing.  HENT:     Head: Normocephalic and atraumatic.     Mouth/Throat:     Mouth: Mucous membranes are moist.     Pharynx: Oropharynx is clear. No oropharyngeal exudate or posterior oropharyngeal erythema.  Eyes:     Extraocular Movements: Extraocular movements intact.     Conjunctiva/sclera: Conjunctivae normal.     Pupils: Pupils are equal, round, and reactive to light.  Cardiovascular:     Rate and Rhythm: Normal rate and regular rhythm.     Pulses: Normal pulses.     Heart sounds: Murmur (click) heard.  Pulmonary:  Effort: Pulmonary effort is normal. No respiratory distress.     Breath sounds: Normal breath sounds. No wheezing, rhonchi or rales.  Abdominal:     General: Bowel sounds are normal.     Palpations: Abdomen is soft. There is no mass.     Tenderness: There is no abdominal tenderness. There is no guarding or rebound.     Hernia: No hernia is present.      Comments: Diverting loop colostomy present to L abdomen  Musculoskeletal:     Right lower leg: No edema.     Left lower leg: No edema.  Skin:    General: Skin is warm and dry.     Findings: No rash.  Neurological:     Mental Status: He is alert.  Psychiatric:        Mood and Affect: Mood normal.        Behavior: Behavior normal.         Assessment & Plan:   Problem List Items Addressed This Visit     Hypertrophic obstructive cardiomyopathy (HCC)   HTN (hypertension)   Chronic, stable. Continue current regimen.       Chronic fatigue   Acute worsening after prolonged surgery and hospitalization. Reviewed anticipated course of recovery.       MDD (major depressive disorder), recurrent episode, moderate (HCC)   This has been  a difficult period for both patient and his wife.  He is seeing psychiatrist more regularly due to this. Support provided.  Continue wellbutrin, sertraline, adderall      Heart failure with preserved ejection fraction (HCC)   Seems euvolemic.  Currently losartan and furosemide on hold after AKI while hospitalized - will await cardiology recommendations for recommencement.       CKD (chronic kidney disease) stage 3, GFR 30-59 ml/min (HCC)   Acute on chronic kidney impairment during hospitalization. Reviewed labs from last week - Cr 1.76, GFR 43 (previously 50s).   Continue to hold losartan and lasix, await cardiology recommendations. Update labwork at physical appt next month.       Perforated diverticulum of large intestine   Colostomy status (HCC)   S/p colostomy takedown, with residual diverting loop colostomy still present.       Status post colostomy takedown - Primary   Recent hospitalization for colostomy takedown with complications leading to 2 wk hospitalization, including 1 wk ICU stay. Records reviewed. He is back home and continues slowly recovering. He has close follow up with general surgeon's office.       Thrombocytopenia (HCC)   Marked - with plt count nadir of 8, thought consumptive as HIT panel was negative.  Received FFP and blood and platelet transfusions while hospitalized.  Latest plt count back to normal 301 last week.         No orders of the defined types were placed in this encounter.   No orders of the defined types were placed in this encounter.   Patient Instructions  Good to see you today Hang in there! We will update labwork next month at physical.  Keep upcoming appointments  Follow up plan: No follow-ups on file.  Anton Blas, MD

## 2024-01-01 ENCOUNTER — Encounter: Payer: Self-pay | Admitting: Family Medicine

## 2024-01-01 DIAGNOSIS — I4891 Unspecified atrial fibrillation: Secondary | ICD-10-CM | POA: Diagnosis not present

## 2024-01-01 DIAGNOSIS — R0602 Shortness of breath: Secondary | ICD-10-CM | POA: Diagnosis not present

## 2024-01-01 DIAGNOSIS — D696 Thrombocytopenia, unspecified: Secondary | ICD-10-CM | POA: Insufficient documentation

## 2024-01-01 DIAGNOSIS — Z952 Presence of prosthetic heart valve: Secondary | ICD-10-CM | POA: Diagnosis not present

## 2024-01-01 DIAGNOSIS — G4730 Sleep apnea, unspecified: Secondary | ICD-10-CM | POA: Diagnosis not present

## 2024-01-01 DIAGNOSIS — Z9889 Other specified postprocedural states: Secondary | ICD-10-CM | POA: Insufficient documentation

## 2024-01-01 DIAGNOSIS — E782 Mixed hyperlipidemia: Secondary | ICD-10-CM | POA: Diagnosis not present

## 2024-01-01 DIAGNOSIS — I421 Obstructive hypertrophic cardiomyopathy: Secondary | ICD-10-CM | POA: Diagnosis not present

## 2024-01-01 DIAGNOSIS — I480 Paroxysmal atrial fibrillation: Secondary | ICD-10-CM | POA: Diagnosis not present

## 2024-01-01 NOTE — Assessment & Plan Note (Signed)
 S/p colostomy takedown, with residual diverting loop colostomy still present.

## 2024-01-01 NOTE — Assessment & Plan Note (Addendum)
 This has been a difficult period for both patient and his wife.  He is seeing psychiatrist more regularly due to this. Support provided.  Continue wellbutrin, sertraline, adderall

## 2024-01-01 NOTE — Assessment & Plan Note (Signed)
 Acute worsening after prolonged surgery and hospitalization. Reviewed anticipated course of recovery.

## 2024-01-01 NOTE — Assessment & Plan Note (Addendum)
 Marked - with plt count nadir of 8, thought consumptive as HIT panel was negative.  Received FFP and blood and platelet transfusions while hospitalized.  Latest plt count back to normal 301 last week.

## 2024-01-01 NOTE — Assessment & Plan Note (Signed)
 Chronic, stable. Continue current regimen.

## 2024-01-01 NOTE — Assessment & Plan Note (Signed)
 Seems euvolemic.  Currently losartan and furosemide on hold after AKI while hospitalized - will await cardiology recommendations for recommencement.

## 2024-01-01 NOTE — Assessment & Plan Note (Signed)
 Recent hospitalization for colostomy takedown with complications leading to 2 wk hospitalization, including 1 wk ICU stay. Records reviewed. He is back home and continues slowly recovering. He has close follow up with general surgeon's office.

## 2024-01-01 NOTE — Assessment & Plan Note (Addendum)
 Acute on chronic kidney impairment during hospitalization. Reviewed labs from last week - Cr 1.76, GFR 43 (previously 50s).   Continue to hold losartan and lasix, await cardiology recommendations. Update labwork at physical appt next month.

## 2024-01-02 DIAGNOSIS — Z79899 Other long term (current) drug therapy: Secondary | ICD-10-CM | POA: Diagnosis not present

## 2024-01-02 DIAGNOSIS — Z5189 Encounter for other specified aftercare: Secondary | ICD-10-CM | POA: Diagnosis not present

## 2024-01-02 DIAGNOSIS — F411 Generalized anxiety disorder: Secondary | ICD-10-CM | POA: Diagnosis not present

## 2024-01-02 DIAGNOSIS — F3341 Major depressive disorder, recurrent, in partial remission: Secondary | ICD-10-CM | POA: Diagnosis not present

## 2024-01-02 DIAGNOSIS — I5033 Acute on chronic diastolic (congestive) heart failure: Secondary | ICD-10-CM | POA: Diagnosis not present

## 2024-01-02 DIAGNOSIS — I480 Paroxysmal atrial fibrillation: Secondary | ICD-10-CM | POA: Diagnosis not present

## 2024-01-02 DIAGNOSIS — F900 Attention-deficit hyperactivity disorder, predominantly inattentive type: Secondary | ICD-10-CM | POA: Diagnosis not present

## 2024-01-02 DIAGNOSIS — Z4801 Encounter for change or removal of surgical wound dressing: Secondary | ICD-10-CM | POA: Diagnosis not present

## 2024-01-02 DIAGNOSIS — Z433 Encounter for attention to colostomy: Secondary | ICD-10-CM | POA: Diagnosis not present

## 2024-01-02 DIAGNOSIS — I110 Hypertensive heart disease with heart failure: Secondary | ICD-10-CM | POA: Diagnosis not present

## 2024-01-02 DIAGNOSIS — I421 Obstructive hypertrophic cardiomyopathy: Secondary | ICD-10-CM | POA: Diagnosis not present

## 2024-01-02 DIAGNOSIS — I69311 Memory deficit following cerebral infarction: Secondary | ICD-10-CM | POA: Diagnosis not present

## 2024-01-03 DIAGNOSIS — I4891 Unspecified atrial fibrillation: Secondary | ICD-10-CM | POA: Diagnosis not present

## 2024-01-03 DIAGNOSIS — Z952 Presence of prosthetic heart valve: Secondary | ICD-10-CM | POA: Diagnosis not present

## 2024-01-04 DIAGNOSIS — Z433 Encounter for attention to colostomy: Secondary | ICD-10-CM | POA: Diagnosis not present

## 2024-01-04 DIAGNOSIS — I480 Paroxysmal atrial fibrillation: Secondary | ICD-10-CM | POA: Diagnosis not present

## 2024-01-04 DIAGNOSIS — I5033 Acute on chronic diastolic (congestive) heart failure: Secondary | ICD-10-CM | POA: Diagnosis not present

## 2024-01-04 DIAGNOSIS — I421 Obstructive hypertrophic cardiomyopathy: Secondary | ICD-10-CM | POA: Diagnosis not present

## 2024-01-04 DIAGNOSIS — I110 Hypertensive heart disease with heart failure: Secondary | ICD-10-CM | POA: Diagnosis not present

## 2024-01-04 DIAGNOSIS — Z4801 Encounter for change or removal of surgical wound dressing: Secondary | ICD-10-CM | POA: Diagnosis not present

## 2024-01-09 DIAGNOSIS — I480 Paroxysmal atrial fibrillation: Secondary | ICD-10-CM | POA: Diagnosis not present

## 2024-01-10 DIAGNOSIS — I480 Paroxysmal atrial fibrillation: Secondary | ICD-10-CM | POA: Diagnosis not present

## 2024-01-10 DIAGNOSIS — I4891 Unspecified atrial fibrillation: Secondary | ICD-10-CM | POA: Diagnosis not present

## 2024-01-10 DIAGNOSIS — Z4801 Encounter for change or removal of surgical wound dressing: Secondary | ICD-10-CM | POA: Diagnosis not present

## 2024-01-10 DIAGNOSIS — Z433 Encounter for attention to colostomy: Secondary | ICD-10-CM | POA: Diagnosis not present

## 2024-01-10 DIAGNOSIS — I421 Obstructive hypertrophic cardiomyopathy: Secondary | ICD-10-CM | POA: Diagnosis not present

## 2024-01-10 DIAGNOSIS — I110 Hypertensive heart disease with heart failure: Secondary | ICD-10-CM | POA: Diagnosis not present

## 2024-01-10 DIAGNOSIS — I5033 Acute on chronic diastolic (congestive) heart failure: Secondary | ICD-10-CM | POA: Diagnosis not present

## 2024-01-11 DIAGNOSIS — Z433 Encounter for attention to colostomy: Secondary | ICD-10-CM | POA: Diagnosis not present

## 2024-01-11 DIAGNOSIS — I421 Obstructive hypertrophic cardiomyopathy: Secondary | ICD-10-CM | POA: Diagnosis not present

## 2024-01-11 DIAGNOSIS — Z4801 Encounter for change or removal of surgical wound dressing: Secondary | ICD-10-CM | POA: Diagnosis not present

## 2024-01-11 DIAGNOSIS — I5033 Acute on chronic diastolic (congestive) heart failure: Secondary | ICD-10-CM | POA: Diagnosis not present

## 2024-01-11 DIAGNOSIS — I110 Hypertensive heart disease with heart failure: Secondary | ICD-10-CM | POA: Diagnosis not present

## 2024-01-11 DIAGNOSIS — I480 Paroxysmal atrial fibrillation: Secondary | ICD-10-CM | POA: Diagnosis not present

## 2024-01-14 DIAGNOSIS — J45909 Unspecified asthma, uncomplicated: Secondary | ICD-10-CM | POA: Diagnosis not present

## 2024-01-14 DIAGNOSIS — I447 Left bundle-branch block, unspecified: Secondary | ICD-10-CM | POA: Diagnosis not present

## 2024-01-14 DIAGNOSIS — Z9049 Acquired absence of other specified parts of digestive tract: Secondary | ICD-10-CM | POA: Diagnosis not present

## 2024-01-14 DIAGNOSIS — Z4801 Encounter for change or removal of surgical wound dressing: Secondary | ICD-10-CM | POA: Diagnosis not present

## 2024-01-14 DIAGNOSIS — Z8673 Personal history of transient ischemic attack (TIA), and cerebral infarction without residual deficits: Secondary | ICD-10-CM | POA: Diagnosis not present

## 2024-01-14 DIAGNOSIS — Z9852 Vasectomy status: Secondary | ICD-10-CM | POA: Diagnosis not present

## 2024-01-14 DIAGNOSIS — K5720 Diverticulitis of large intestine with perforation and abscess without bleeding: Secondary | ICD-10-CM | POA: Diagnosis not present

## 2024-01-14 DIAGNOSIS — Z952 Presence of prosthetic heart valve: Secondary | ICD-10-CM | POA: Diagnosis not present

## 2024-01-14 DIAGNOSIS — M1990 Unspecified osteoarthritis, unspecified site: Secondary | ICD-10-CM | POA: Diagnosis not present

## 2024-01-14 DIAGNOSIS — E669 Obesity, unspecified: Secondary | ICD-10-CM | POA: Diagnosis not present

## 2024-01-14 DIAGNOSIS — Z7901 Long term (current) use of anticoagulants: Secondary | ICD-10-CM | POA: Diagnosis not present

## 2024-01-14 DIAGNOSIS — I5033 Acute on chronic diastolic (congestive) heart failure: Secondary | ICD-10-CM | POA: Diagnosis not present

## 2024-01-14 DIAGNOSIS — I480 Paroxysmal atrial fibrillation: Secondary | ICD-10-CM | POA: Diagnosis not present

## 2024-01-14 DIAGNOSIS — F909 Attention-deficit hyperactivity disorder, unspecified type: Secondary | ICD-10-CM | POA: Diagnosis not present

## 2024-01-14 DIAGNOSIS — K219 Gastro-esophageal reflux disease without esophagitis: Secondary | ICD-10-CM | POA: Diagnosis not present

## 2024-01-14 DIAGNOSIS — F32A Depression, unspecified: Secondary | ICD-10-CM | POA: Diagnosis not present

## 2024-01-14 DIAGNOSIS — E785 Hyperlipidemia, unspecified: Secondary | ICD-10-CM | POA: Diagnosis not present

## 2024-01-14 DIAGNOSIS — Z433 Encounter for attention to colostomy: Secondary | ICD-10-CM | POA: Diagnosis not present

## 2024-01-14 DIAGNOSIS — I421 Obstructive hypertrophic cardiomyopathy: Secondary | ICD-10-CM | POA: Diagnosis not present

## 2024-01-14 DIAGNOSIS — I110 Hypertensive heart disease with heart failure: Secondary | ICD-10-CM | POA: Diagnosis not present

## 2024-01-14 DIAGNOSIS — D62 Acute posthemorrhagic anemia: Secondary | ICD-10-CM | POA: Diagnosis not present

## 2024-01-14 DIAGNOSIS — Z6832 Body mass index (BMI) 32.0-32.9, adult: Secondary | ICD-10-CM | POA: Diagnosis not present

## 2024-01-14 DIAGNOSIS — I440 Atrioventricular block, first degree: Secondary | ICD-10-CM | POA: Diagnosis not present

## 2024-01-14 DIAGNOSIS — D72829 Elevated white blood cell count, unspecified: Secondary | ICD-10-CM | POA: Diagnosis not present

## 2024-01-14 DIAGNOSIS — G4733 Obstructive sleep apnea (adult) (pediatric): Secondary | ICD-10-CM | POA: Diagnosis not present

## 2024-01-17 DIAGNOSIS — Z4801 Encounter for change or removal of surgical wound dressing: Secondary | ICD-10-CM | POA: Diagnosis not present

## 2024-01-17 DIAGNOSIS — I4891 Unspecified atrial fibrillation: Secondary | ICD-10-CM | POA: Diagnosis not present

## 2024-01-17 DIAGNOSIS — I421 Obstructive hypertrophic cardiomyopathy: Secondary | ICD-10-CM | POA: Diagnosis not present

## 2024-01-17 DIAGNOSIS — I480 Paroxysmal atrial fibrillation: Secondary | ICD-10-CM | POA: Diagnosis not present

## 2024-01-17 DIAGNOSIS — I110 Hypertensive heart disease with heart failure: Secondary | ICD-10-CM | POA: Diagnosis not present

## 2024-01-17 DIAGNOSIS — Z433 Encounter for attention to colostomy: Secondary | ICD-10-CM | POA: Diagnosis not present

## 2024-01-17 DIAGNOSIS — I5033 Acute on chronic diastolic (congestive) heart failure: Secondary | ICD-10-CM | POA: Diagnosis not present

## 2024-01-18 DIAGNOSIS — I480 Paroxysmal atrial fibrillation: Secondary | ICD-10-CM | POA: Diagnosis not present

## 2024-01-18 DIAGNOSIS — R0602 Shortness of breath: Secondary | ICD-10-CM | POA: Diagnosis not present

## 2024-01-18 DIAGNOSIS — I4891 Unspecified atrial fibrillation: Secondary | ICD-10-CM | POA: Diagnosis not present

## 2024-01-21 DIAGNOSIS — I5033 Acute on chronic diastolic (congestive) heart failure: Secondary | ICD-10-CM | POA: Diagnosis not present

## 2024-01-21 DIAGNOSIS — I110 Hypertensive heart disease with heart failure: Secondary | ICD-10-CM | POA: Diagnosis not present

## 2024-01-21 DIAGNOSIS — Z4801 Encounter for change or removal of surgical wound dressing: Secondary | ICD-10-CM | POA: Diagnosis not present

## 2024-01-21 DIAGNOSIS — Z433 Encounter for attention to colostomy: Secondary | ICD-10-CM | POA: Diagnosis not present

## 2024-01-21 DIAGNOSIS — I421 Obstructive hypertrophic cardiomyopathy: Secondary | ICD-10-CM | POA: Diagnosis not present

## 2024-01-21 DIAGNOSIS — I480 Paroxysmal atrial fibrillation: Secondary | ICD-10-CM | POA: Diagnosis not present

## 2024-01-21 DIAGNOSIS — I4891 Unspecified atrial fibrillation: Secondary | ICD-10-CM | POA: Diagnosis not present

## 2024-01-24 DIAGNOSIS — I4891 Unspecified atrial fibrillation: Secondary | ICD-10-CM | POA: Diagnosis not present

## 2024-01-25 DIAGNOSIS — I421 Obstructive hypertrophic cardiomyopathy: Secondary | ICD-10-CM | POA: Diagnosis not present

## 2024-01-25 DIAGNOSIS — I5033 Acute on chronic diastolic (congestive) heart failure: Secondary | ICD-10-CM | POA: Diagnosis not present

## 2024-01-25 DIAGNOSIS — Z4801 Encounter for change or removal of surgical wound dressing: Secondary | ICD-10-CM | POA: Diagnosis not present

## 2024-01-25 DIAGNOSIS — I110 Hypertensive heart disease with heart failure: Secondary | ICD-10-CM | POA: Diagnosis not present

## 2024-01-25 DIAGNOSIS — Z433 Encounter for attention to colostomy: Secondary | ICD-10-CM | POA: Diagnosis not present

## 2024-01-25 DIAGNOSIS — I480 Paroxysmal atrial fibrillation: Secondary | ICD-10-CM | POA: Diagnosis not present

## 2024-01-29 DIAGNOSIS — E291 Testicular hypofunction: Secondary | ICD-10-CM | POA: Diagnosis not present

## 2024-01-30 DIAGNOSIS — F3341 Major depressive disorder, recurrent, in partial remission: Secondary | ICD-10-CM | POA: Diagnosis not present

## 2024-01-30 DIAGNOSIS — Z4801 Encounter for change or removal of surgical wound dressing: Secondary | ICD-10-CM | POA: Diagnosis not present

## 2024-01-30 DIAGNOSIS — I480 Paroxysmal atrial fibrillation: Secondary | ICD-10-CM | POA: Diagnosis not present

## 2024-01-30 DIAGNOSIS — I5033 Acute on chronic diastolic (congestive) heart failure: Secondary | ICD-10-CM | POA: Diagnosis not present

## 2024-01-30 DIAGNOSIS — I421 Obstructive hypertrophic cardiomyopathy: Secondary | ICD-10-CM | POA: Diagnosis not present

## 2024-01-30 DIAGNOSIS — Z433 Encounter for attention to colostomy: Secondary | ICD-10-CM | POA: Diagnosis not present

## 2024-01-30 DIAGNOSIS — I69311 Memory deficit following cerebral infarction: Secondary | ICD-10-CM | POA: Diagnosis not present

## 2024-01-30 DIAGNOSIS — Z79899 Other long term (current) drug therapy: Secondary | ICD-10-CM | POA: Diagnosis not present

## 2024-01-30 DIAGNOSIS — F900 Attention-deficit hyperactivity disorder, predominantly inattentive type: Secondary | ICD-10-CM | POA: Diagnosis not present

## 2024-01-30 DIAGNOSIS — I110 Hypertensive heart disease with heart failure: Secondary | ICD-10-CM | POA: Diagnosis not present

## 2024-01-30 DIAGNOSIS — F411 Generalized anxiety disorder: Secondary | ICD-10-CM | POA: Diagnosis not present

## 2024-01-31 DIAGNOSIS — Z952 Presence of prosthetic heart valve: Secondary | ICD-10-CM | POA: Diagnosis not present

## 2024-02-02 ENCOUNTER — Other Ambulatory Visit: Payer: Self-pay | Admitting: Family Medicine

## 2024-02-02 DIAGNOSIS — E785 Hyperlipidemia, unspecified: Secondary | ICD-10-CM

## 2024-02-02 DIAGNOSIS — M109 Gout, unspecified: Secondary | ICD-10-CM

## 2024-02-02 DIAGNOSIS — N1831 Chronic kidney disease, stage 3a: Secondary | ICD-10-CM

## 2024-02-02 DIAGNOSIS — I480 Paroxysmal atrial fibrillation: Secondary | ICD-10-CM

## 2024-02-02 DIAGNOSIS — E559 Vitamin D deficiency, unspecified: Secondary | ICD-10-CM

## 2024-02-02 DIAGNOSIS — Z125 Encounter for screening for malignant neoplasm of prostate: Secondary | ICD-10-CM

## 2024-02-04 ENCOUNTER — Telehealth: Payer: Self-pay | Admitting: Family Medicine

## 2024-02-04 ENCOUNTER — Ambulatory Visit (INDEPENDENT_AMBULATORY_CARE_PROVIDER_SITE_OTHER): Payer: Medicare Other

## 2024-02-04 VITALS — BP 138/68 | Ht 65.0 in | Wt 214.0 lb

## 2024-02-04 DIAGNOSIS — Z0000 Encounter for general adult medical examination without abnormal findings: Secondary | ICD-10-CM | POA: Diagnosis not present

## 2024-02-04 NOTE — Telephone Encounter (Signed)
 Copied from CRM #[id number]. Topic: General - Other >> Feb 04, 2024 12:18 PM Thersia BROCKS wrote: Reason for CRM: Patient wife called in regarding missed Medicare Annual Wellness, would like a callback regarding this

## 2024-02-04 NOTE — Progress Notes (Signed)
 Because this visit was a virtual/telehealth visit,  certain criteria was not obtained, such a blood pressure, CBG if applicable, and timed get up and go. Any medications not marked as taking were not mentioned during the medication reconciliation part of the visit. Any vitals not documented were not able to be obtained due to this being a telehealth visit or patient was unable to self-report a recent blood pressure reading due to a lack of equipment at home via telehealth. Vitals that have been documented are verbally provided by the patient.  This visit was performed by a medical professional under my direct supervision. I was immediately available for consultation/collaboration. I have reviewed and agree with the Annual Wellness Visit documentation.  Subjective:   Willliam Fletcher is a 63 y.o. who presents for a Medicare Wellness preventive visit.  As a reminder, Annual Wellness Visits don't include a physical exam, and some assessments may be limited, especially if this visit is performed virtually. We may recommend an in-person follow-up visit with your provider if needed.  Visit Complete: Virtual I connected with  Dylan Fletcher on 02/04/24 by a audio enabled telemedicine application and verified that I am speaking with the correct person using two identifiers.  Patient Location: Home  Provider Location: Home Office  I discussed the limitations of evaluation and management by telemedicine. The patient expressed understanding and agreed to proceed.  Vital Signs: Because this visit was a virtual/telehealth visit, some criteria may be missing or patient reported. Any vitals not documented were not able to be obtained and vitals that have been documented are patient reported.  VideoDeclined- This patient declined Librarian, academic. Therefore the visit was completed with audio only.  Persons Participating in Visit: Patient.  AWV Questionnaire: No: Patient  Medicare AWV questionnaire was not completed prior to this visit.  Cardiac Risk Factors include: advanced age (>77men, >60 women);male gender;obesity (BMI >30kg/m2);hypertension;Other (see comment), Risk factor comments: afib ,     Objective:    Today's Vitals   02/04/24 1322  BP: 138/68  Weight: 214 lb (97.1 kg)  Height: 5' 5 (1.651 m)   Body mass index is 35.61 kg/m.     02/04/2024    1:30 PM 01/29/2023   10:40 AM 01/26/2022    9:23 AM 11/10/2019    3:41 PM 01/29/2016    5:07 PM 12/18/2014    2:07 PM  Advanced Directives  Does Patient Have a Medical Advance Directive? Yes Yes No No No  No   Type of Estate agent of [place];Living will Healthcare Power of [place];Living will      Does patient want to make changes to medical advance directive? No - Patient declined No - Patient declined No - Patient declined No - Patient declined    Copy of Healthcare Power of Attorney in Chart? Yes - validated most recent copy scanned in chart (See row information) Yes - validated most recent copy scanned in chart (See row information)      Would patient like information on creating a medical advance directive?   No - Patient declined  No - patient declined information  Yes - Educational materials given      Data saved with a previous flowsheet row definition    Current Medications (verified) Outpatient Encounter Medications as of 02/04/2024  Medication Sig   acetaminophen  (TYLENOL ) 500 MG tablet Take 1 tablet (500 mg total) by mouth 2 (two) times daily.   acetaminophen -codeine  (TYLENOL  #3) 300-30 MG tablet Take  1 tablet by mouth every 8 (eight) hours as needed for moderate pain.   amoxicillin  (AMOXIL ) 500 MG tablet Take 500 mg by mouth as directed. Takes 4 tablets 1 hour prior to dental procedures   amphetamine-dextroamphetamine (ADDERALL XR) 20 MG 24 hr capsule Take 20 mg by mouth daily. Takes 2 tablets (40 mg) every morning   amphetamine-dextroamphetamine (ADDERALL) 30 MG  tablet Take by mouth.   buPROPion (WELLBUTRIN XL) 150 MG 24 hr tablet Take 1 tablet by mouth every morning.   Cholecalciferol 50 MCG (2000 UT) CAPS 1 capsule every day by oral route.   clomiPHENE (CLOMID) 50 MG tablet    diphenhydrAMINE (BENADRYL) 25 mg capsule Take by mouth.   famotidine (PEPCID) 20 MG tablet Take by mouth.   fenofibrate  (TRICOR ) 145 MG tablet Take 1 tablet (145 mg total) by mouth daily.   furosemide (LASIX) 20 MG tablet Take 1 tablet (20 mg total) by mouth daily. With extra prn weight gain   Glucosamine-Vitamin D  1000-200 MG-UNIT TABS Take 2 tablets by mouth daily.   liothyronine (CYTOMEL) 5 MCG tablet Take 2 tablets by mouth daily.   losartan (COZAAR) 100 MG tablet    meclizine (ANTIVERT) 25 MG tablet Take by mouth.   Multiple Vitamin (MULTIVITAMIN) tablet Take 1 tablet by mouth daily.   oxyCODONE  (OXY IR/ROXICODONE ) 5 MG immediate release tablet Take 5 mg by mouth.   rosuvastatin (CRESTOR) 10 MG tablet Take 10 mg by mouth at bedtime.   sertraline (ZOLOFT) 50 MG tablet Take 50 mg by mouth daily.   sildenafil  (VIAGRA ) 100 MG tablet Take 1 tablet (100 mg total) by mouth daily as needed for erectile dysfunction.   sotalol (BETAPACE) 80 MG tablet Take 80 mg by mouth 2 (two) times daily.   warfarin (COUMADIN) 5 MG tablet Take 5 mg by mouth as directed.    warfarin (COUMADIN) 7.5 MG tablet SMARTSIG:0.5-1 Tablet(s) By Mouth As Directed   No facility-administered encounter medications on file as of 02/04/2024.    Allergies (verified) Nicardipine hcl, Nitroglycerin, Nsaids, Calcium channel blockers, Diltiazem, Latex, Molds & smuts, Shellfish allergy, and Tape   History: Past Medical History:  Diagnosis Date   ADD (attention deficit disorder)    sees psych in [place], [place] (meds from there)   Allergic rhinitis    Ragweed, mold, mildew   Anxiety associated with depression    sees psych in [place], [place] (meds from there)   Arrhythmia    Claustrophobia    Colon polyp 04/19/2009    rectal tubular adenoma, rec rpt 3 yrs   COVID-19 05/24/2022   CVA (cerebral infarction) 08/17/2013   L thalamic lacunar infarct post CVTS surgery, s/p neuropsychology testing, completed speech therapy. no ASA 2/2 no CAD hx   ED (erectile dysfunction)    H/O mitral valve replacement 08/17/2013   St Jude, needs abx ppx, completed cardiac rehab 12/2013   History of asthma    as child   HLD (hyperlipidemia)    HTN (hypertension)    Hypertrophic obstructive cardiomyopathy(425.11)    Cards (Dr. Jenel Henry [facility] - need abx prophylaxis - now established with [facility]  Dr. Zoran Popovic ([phone]) and Dr. Meade and Mabel Mulberry s/p myomectomy, now cardiac rehab [facility] 09/2013   LBBB (left bundle branch block) 08/17/2013   after myomectomy   Migraine with aura    Need for prophylactic antibiotic    Obesity    saw nutritionist 12/18/2014   OSA (obstructive sleep apnea)    CPAP at night,  up to 16 mmHg (Dr Shona at [facility])   Paroxysmal atrial fibrillation (HCC) 04/2013, 05/2013   with RVR; s/p multiple hospitalizations, spontaneous conversion, referred to Dr. Maree EP, then recurrence - failed sotalol, multaq (both with spont conversions)   Transient alteration of awareness 05/19/2014   during hospitalization pending EEG [facility])   Vitamin D  deficiency    Warfarin anticoagulation    goal INR 2.5-3.5   Past Surgical History:  Procedure Laterality Date   CARDIAC CATHETERIZATION  06/19/2012   done for chest pain, no plaque buildup   COLONOSCOPY  04/19/2009   rectal tubular adenoma x1, rpt 3 yrs   COLONOSCOPY  04/19/2014   mild diverticulosis, no polyps, rec rpt 5 yrs [facility])   COLONOSCOPY  08/2023   1cm polyp, diverticulosis ([place] at [facility])   EYE SURGERY  1969; 1971   EYE SURGERY Right 04/20/2015   for PVD   hospitalization  04/19/2014   subtherapeutic INR, heparin bridge   INGUINAL HERNIA REPAIR  06/19/1985   KNEE ARTHROSCOPY  2006; 2007   right   MITRAL VALVE  REPLACEMENT  08/17/2013   St Jude MV for severe central mitral regurg   MYOMECTOMY  08/22/2013   with MAZE procedure [facility] (Drs Yaakov Gore)   PFTs  07/20/2013   FVC 79%, FEV1 72%, ratio 0.71 - mild obstruction, o/w normal   VASECTOMY  06/19/1990   WISDOM TOOTH EXTRACTION  06/20/1975   Family History  Problem Relation Age of Onset   Alcohol abuse Brother    Cancer Mother 89       breast   Ulcers Mother    Heart disease Mother        HOCM   Psoriasis Father    Cancer Maternal Grandfather        colon   Coronary artery disease Paternal Grandfather 31       MI   Diabetes Neg Hx    Stroke Neg Hx    Social History   Socioeconomic History   Marital status: Married    Spouse name: Not on file   Number of children: Not on file   Years of education: Not on file   Highest education level: Bachelor's degree (e.g., BA, AB, BS)  Occupational History   Not on file  Tobacco Use   Smoking status: Never   Smokeless tobacco: Never  Substance and Sexual Activity   Alcohol use: Yes    Comment: rarely   Drug use: No   Sexual activity: Not on file  Other Topics Concern   Not on file  Social History Narrative   Caffeine: quart of soda/day   Lives with wife ([name]), 3 cats. No children.   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   Edu: Bachelor's degree   Activity: walking several times a week   Diet: good water, fruits/vegetables daily       Cards: Dr Jenel Henry [facility] (HOCM) and Dr Maree [place] (EP), as well as [facility] Dr Gloria Gore   Social Drivers of Health   Financial Resource Strain: Low Risk  (02/04/2024)   Overall Financial Resource Strain (CARDIA)    Difficulty of Paying Living Expenses: Not very hard  Food Insecurity: No Food Insecurity (02/04/2024)   Hunger Vital Sign    Worried About Running Out of Food in the Last Year: Never true    Ran Out of Food in the Last Year: Never true  Transportation Needs: No Transportation Needs (02/04/2024)  PRAPARE - Administrator, Civil Service (Medical): No    Lack of Transportation (Non-Medical): No  Physical Activity: Inactive (02/04/2024)   Exercise Vital Sign    Days of Exercise per Week: 0 days    Minutes of Exercise per Session: 0 min  Stress: Stress Concern Present (02/04/2024)   [employer] of Occupational Health - Occupational Stress Questionnaire    Feeling of Stress: Rather much  Social Connections: Moderately Integrated (02/04/2024)   Social Connection and Isolation Panel    Frequency of Communication with Friends and Family: Once a week    Frequency of Social Gatherings with Friends and Family: Once a week    Attends Religious Services: 1 to 4 times per year    Active Member of [employer] or Organizations: Yes    Attends Engineer, structural: More than 4 times per year    Marital Status: Married    Tobacco Counseling Counseling given: Not Answered    Clinical Intake:  Pre-visit preparation completed: Yes  Pain : No/denies pain     BMI - recorded: 35.61 Nutritional Status: BMI > 30  Obese Nutritional Risks: None Diabetes: No  Lab Results  Component Value Date   HGBA1C 5.4 06/09/2016     How often do you need to have someone help you when you read instructions, pamphlets, or other written materials from your doctor or pharmacy?: 1 - Never  Interpreter Needed?: No  Information entered by :: Anali Cabanilla whit field,CMA   Activities of Daily Living     02/04/2024    1:28 PM  In your present state of health, do you have any difficulty performing the following activities:  Hearing? 1  Vision? 0  Difficulty concentrating or making decisions? 1  Comment patient wife said he is have this more and more  Walking or climbing stairs? 0  Dressing or bathing? 0  Doing errands, shopping? 0  Preparing Food and eating ? N  Using the Toilet? N  In the past six months, have you accidently leaked urine? N  Do you have problems with loss of bowel  control? N  Managing your Medications? N  Managing your Finances? N  Housekeeping or managing your Housekeeping? N    Patient Care Team: Rilla Baller, MD as PCP - General (Family Medicine) Maree Rasp, MD (Cardiology) Alena Jenel HERO, MD (Inactive) (Cardiology) Fate Morna SAILOR, [facility] (Inactive) as Pharmacist (Pharmacist)  I have updated your Care Teams any recent Medical Services you may have received from other providers in the past year.     Assessment:   This is a routine wellness examination for Riggs.  Hearing/Vision screen Hearing Screening - Comments:: Some difficulties in left ears  Vision Screening - Comments:: Patient wears glasses    Goals Addressed             This Visit's Progress    Patient Stated       Patient would like to get back to golf        Depression Screen     02/04/2024    1:30 PM 12/31/2023   11:55 AM 09/11/2023   11:32 AM 08/07/2023    8:07 AM 05/02/2023   11:09 AM 01/29/2023   10:37 AM 08/02/2022   10:12 AM  PHQ 2/9 Scores  PHQ - 2 Score 2 2 1 1 2  0 2  PHQ- 9 Score 4 12 8 7 5  6     Fall Risk     02/04/2024  1:29 PM 12/31/2023   11:55 AM 09/11/2023   11:32 AM 08/07/2023    8:07 AM 05/02/2023   11:08 AM  Fall Risk   Falls in the past year? 1 0 1 1 0  Number falls in past yr: 0 1 0 1 0  Injury with Fall? 0 0 0 0 0  Risk for fall due to : No Fall Risks Other (Comment)   No Fall Risks  Follow up Falls evaluation completed    Falls evaluation completed    MEDICARE RISK AT HOME:  Medicare Risk at Home Any stairs in or around the home?: Yes If so, are there any without handrails?: No Home free of loose throw rugs in walkways, pet beds, electrical cords, etc?: Yes Adequate lighting in your home to reduce risk of falls?: Yes Life alert?: No Use of a cane, walker or w/c?: No Grab bars in the bathroom?: Yes Shower chair or bench in shower?: Yes Elevated toilet seat or a handicapped toilet?: Yes  TIMED UP AND GO:  Was the  test performed?  No  Cognitive Function: 6CIT completed    11/10/2019    3:57 PM  MMSE - Mini Mental State Exam  Not completed: Unable to complete        02/04/2024    1:26 PM 01/29/2023   10:42 AM  6CIT Screen  What Year? 0 points 0 points  What month? 0 points 0 points  What time? 0 points 0 points  Count back from 20 0 points 0 points  Months in reverse 0 points 0 points  Repeat phrase 0 points 8 points  Total Score 0 points 8 points    Immunizations Immunization History  Administered Date(s) Administered   Influenza, High Dose Seasonal PF 03/16/2022   Influenza,inj,Quad PF,6+ Mos 03/19/2015, 03/17/2016, 03/19/2018, 04/04/2019, 04/28/2020, 03/30/2021   Influenza,inj,quad, With Preservative 03/19/2016   Influenza-Unspecified 03/19/2014, 03/16/2017   Moderna Covid-19 Vaccine Bivalent Booster 82yrs & up 03/17/2021   Moderna Sars-Covid-2 Vaccination 07/15/2019, 09/22/2019   PNEUMOCOCCAL CONJUGATE-20 09/08/2021   Pfizer(Comirnaty)Fall Seasonal Vaccine 12 years and older 03/16/2022   Pneumococcal Polysaccharide-23 03/19/2015   Tdap 02/06/2013, 11/29/2019   Zoster Recombinant(Shingrix) 09/01/2021, 01/20/2022    Screening Tests Health Maintenance  Topic Date Due   HIV Screening  Never done   COVID-19 Vaccine (5 - 2024-25 season) 02/18/2023   INFLUENZA VACCINE  01/18/2024   Fecal DNA (Cologuard)  08/12/2024   Medicare Annual Wellness (AWV)  02/03/2025   DTaP/Tdap/Td (3 - Td or Tdap) 11/28/2029   Pneumococcal Vaccine: 50+ Years  Completed   Hepatitis C Screening  Completed   Zoster Vaccines- Shingrix  Completed   Hepatitis B Vaccines 19-59 Average Risk  Aged Out   HPV VACCINES  Aged Out   Meningococcal B Vaccine  Aged Out   Pneumococcal Vaccine  Discontinued   Colonoscopy  Discontinued    Health Maintenance  Health Maintenance Due  Topic Date Due   HIV Screening  Never done   COVID-19 Vaccine (5 - 2024-25 season) 02/18/2023   INFLUENZA VACCINE  01/18/2024    Health Maintenance Items Addressed:patient declined at the moment   Additional Screening:  Vision Screening: Recommended annual ophthalmology exams for early detection of glaucoma and other disorders of the eye. Would you like a referral to an eye doctor? No    Dental Screening: Recommended annual dental exams for proper oral hygiene  Community Resource Referral / Chronic Care Management: CRR required this visit?  No   CCM required this  visit?  No   Plan:    I have personally reviewed and noted the following in the patient's chart:   Medical and social history Use of alcohol, tobacco or illicit drugs  Current medications and supplements including opioid prescriptions. Patient is not currently taking opioid prescriptions. Functional ability and status Nutritional status Physical activity Advanced directives List of other physicians Hospitalizations, surgeries, and ER visits in previous 12 months Vitals Screenings to include cognitive, depression, and falls Referrals and appointments  In addition, I have reviewed and discussed with patient certain preventive protocols, quality metrics, and best practice recommendations. A written personalized care plan for preventive services as well as general preventive health recommendations were provided to patient.   Lyle MARLA Right, [place]   02/04/2024   After Visit Summary: (MyChart) Due to this being a telephonic visit, the after visit summary with patients personalized plan was offered to patient via MyChart   Notes: Nothing significant to report at this time.

## 2024-02-04 NOTE — Patient Instructions (Signed)
 Mr. Dylan Fletcher , Thank you for taking time out of your busy schedule to complete your Annual Wellness Visit with me. I enjoyed our conversation and look forward to speaking with you again next year. I, as well as your care team,  appreciate your ongoing commitment to your health goals. Please review the following plan we discussed and let me know if I can assist you in the future. Your Game plan/ To Do List    Referrals: If you haven't heard from the office you've been referred to, please reach out to them at the phone provided.   Follow up Visits: We will see or speak with you next year for your Next Medicare AWV with our clinical staff Have you seen your provider in the last 6 months (3 months if uncontrolled diabetes)? No  Clinician Recommendations:  Aim for 30 minutes of exercise or brisk walking, 6-8 glasses of water, and 5 servings of fruits and vegetables each day.       This is a list of the screenings recommended for you:  Health Maintenance  Topic Date Due   HIV Screening  Never done   COVID-19 Vaccine (5 - 2024-25 season) 02/18/2023   Flu Shot  01/18/2024   Cologuard (Stool DNA test)  08/12/2024   Medicare Annual Wellness Visit  02/03/2025   DTaP/Tdap/Td vaccine (3 - Td or Tdap) 11/28/2029   Pneumococcal Vaccine for age over 42  Completed   Hepatitis C Screening  Completed   Zoster (Shingles) Vaccine  Completed   Hepatitis B Vaccine  Aged Out   HPV Vaccine  Aged Out   Meningitis B Vaccine  Aged Out   Pneumococcal Vaccine  Discontinued   Colon Cancer Screening  Discontinued    Advanced directives: in chart Advance Care Planning is important because it:  [x]  Makes sure you receive the medical care that is consistent with your values, goals, and preferences  [x]  It provides guidance to your family and loved ones and reduces their decisional burden about whether or not they are making the right decisions based on your wishes.  Follow the link provided in your after  visit summary or read over the paperwork we have mailed to you to help you started getting your Advance Directives in place. If you need assistance in completing these, please reach out to us  so that we can help you!  See attachments for Preventive Care and Fall Prevention Tips.

## 2024-02-05 ENCOUNTER — Other Ambulatory Visit: Payer: Medicare Other

## 2024-02-05 ENCOUNTER — Other Ambulatory Visit (INDEPENDENT_AMBULATORY_CARE_PROVIDER_SITE_OTHER)

## 2024-02-05 DIAGNOSIS — Z125 Encounter for screening for malignant neoplasm of prostate: Secondary | ICD-10-CM | POA: Diagnosis not present

## 2024-02-05 DIAGNOSIS — E559 Vitamin D deficiency, unspecified: Secondary | ICD-10-CM | POA: Diagnosis not present

## 2024-02-05 DIAGNOSIS — M109 Gout, unspecified: Secondary | ICD-10-CM

## 2024-02-05 DIAGNOSIS — E785 Hyperlipidemia, unspecified: Secondary | ICD-10-CM

## 2024-02-05 DIAGNOSIS — N1831 Chronic kidney disease, stage 3a: Secondary | ICD-10-CM | POA: Diagnosis not present

## 2024-02-05 DIAGNOSIS — E291 Testicular hypofunction: Secondary | ICD-10-CM | POA: Diagnosis not present

## 2024-02-05 DIAGNOSIS — I480 Paroxysmal atrial fibrillation: Secondary | ICD-10-CM

## 2024-02-05 DIAGNOSIS — R5383 Other fatigue: Secondary | ICD-10-CM | POA: Diagnosis not present

## 2024-02-05 LAB — CBC WITH DIFFERENTIAL/PLATELET
Basophils Absolute: 0.1 K/uL (ref 0.0–0.1)
Basophils Relative: 0.5 % (ref 0.0–3.0)
Eosinophils Absolute: 0.2 K/uL (ref 0.0–0.7)
Eosinophils Relative: 1.7 % (ref 0.0–5.0)
HCT: 40 % (ref 39.0–52.0)
Hemoglobin: 12.8 g/dL — ABNORMAL LOW (ref 13.0–17.0)
Lymphocytes Relative: 43.5 % (ref 12.0–46.0)
Lymphs Abs: 5 K/uL — ABNORMAL HIGH (ref 0.7–4.0)
MCHC: 32.1 g/dL (ref 30.0–36.0)
MCV: 90.8 fl (ref 78.0–100.0)
Monocytes Absolute: 1.2 K/uL — ABNORMAL HIGH (ref 0.1–1.0)
Monocytes Relative: 10.5 % (ref 3.0–12.0)
Neutro Abs: 5.1 K/uL (ref 1.4–7.7)
Neutrophils Relative %: 43.8 % (ref 43.0–77.0)
Platelets: 252 K/uL (ref 150.0–400.0)
RBC: 4.4 Mil/uL (ref 4.22–5.81)
RDW: 15.4 % (ref 11.5–15.5)
WBC: 11.6 K/uL — ABNORMAL HIGH (ref 4.0–10.5)

## 2024-02-05 LAB — COMPREHENSIVE METABOLIC PANEL WITH GFR
ALT: 15 U/L (ref 0–53)
AST: 21 U/L (ref 0–37)
Albumin: 3.9 g/dL (ref 3.5–5.2)
Alkaline Phosphatase: 20 U/L — ABNORMAL LOW (ref 39–117)
BUN: 25 mg/dL — ABNORMAL HIGH (ref 6–23)
CO2: 27 meq/L (ref 19–32)
Calcium: 9.1 mg/dL (ref 8.4–10.5)
Chloride: 106 meq/L (ref 96–112)
Creatinine, Ser: 1.21 mg/dL (ref 0.40–1.50)
GFR: 63.07 mL/min (ref >60.00)
Glucose, Bld: 106 mg/dL — ABNORMAL HIGH (ref 70–99)
Potassium: 4.6 meq/L (ref 3.5–5.1)
Sodium: 139 meq/L (ref 135–145)
Total Bilirubin: 0.3 mg/dL (ref 0.2–1.2)
Total Protein: 6.4 g/dL (ref 6.0–8.3)

## 2024-02-05 LAB — PHOSPHORUS: Phosphorus: 3.2 mg/dL (ref 2.3–4.6)

## 2024-02-05 LAB — LIPID PANEL
Cholesterol: 120 mg/dL (ref 0–200)
HDL: 35.1 mg/dL — ABNORMAL LOW (ref >39.00)
LDL Cholesterol: 66 mg/dL (ref 0–99)
NonHDL: 85.13
Total CHOL/HDL Ratio: 3
Triglycerides: 94 mg/dL (ref 0.0–149.0)
VLDL: 18.8 mg/dL (ref 0.0–40.0)

## 2024-02-05 LAB — MICROALBUMIN / CREATININE URINE RATIO
Creatinine,U: 70 mg/dL
Microalb Creat Ratio: UNDETERMINED mg/g (ref 0.0–30.0)
Microalb, Ur: <0.7 mg/dL

## 2024-02-05 LAB — VITAMIN D 25 HYDROXY (VIT D DEFICIENCY, FRACTURES): VITD: 56.36 ng/mL (ref 30.00–100.00)

## 2024-02-05 LAB — URIC ACID: Uric Acid, Serum: 4.1 mg/dL (ref 4.0–7.8)

## 2024-02-05 LAB — PSA, MEDICARE: PSA: 2.54 ng/mL (ref 0.10–4.00)

## 2024-02-06 ENCOUNTER — Ambulatory Visit: Payer: Self-pay | Admitting: Family Medicine

## 2024-02-06 LAB — PARATHYROID HORMONE, INTACT (NO CA): PTH: 14 pg/mL — ABNORMAL LOW (ref 16–77)

## 2024-02-06 NOTE — Telephone Encounter (Signed)
 Patient AWV was completed.

## 2024-02-07 DIAGNOSIS — I5033 Acute on chronic diastolic (congestive) heart failure: Secondary | ICD-10-CM | POA: Diagnosis not present

## 2024-02-07 DIAGNOSIS — I110 Hypertensive heart disease with heart failure: Secondary | ICD-10-CM | POA: Diagnosis not present

## 2024-02-07 DIAGNOSIS — Z433 Encounter for attention to colostomy: Secondary | ICD-10-CM | POA: Diagnosis not present

## 2024-02-07 DIAGNOSIS — Z4801 Encounter for change or removal of surgical wound dressing: Secondary | ICD-10-CM | POA: Diagnosis not present

## 2024-02-07 DIAGNOSIS — I480 Paroxysmal atrial fibrillation: Secondary | ICD-10-CM | POA: Diagnosis not present

## 2024-02-07 DIAGNOSIS — I421 Obstructive hypertrophic cardiomyopathy: Secondary | ICD-10-CM | POA: Diagnosis not present

## 2024-02-08 DIAGNOSIS — Z433 Encounter for attention to colostomy: Secondary | ICD-10-CM | POA: Diagnosis not present

## 2024-02-08 DIAGNOSIS — I5033 Acute on chronic diastolic (congestive) heart failure: Secondary | ICD-10-CM | POA: Diagnosis not present

## 2024-02-08 DIAGNOSIS — I421 Obstructive hypertrophic cardiomyopathy: Secondary | ICD-10-CM | POA: Diagnosis not present

## 2024-02-08 DIAGNOSIS — I110 Hypertensive heart disease with heart failure: Secondary | ICD-10-CM | POA: Diagnosis not present

## 2024-02-08 DIAGNOSIS — I480 Paroxysmal atrial fibrillation: Secondary | ICD-10-CM | POA: Diagnosis not present

## 2024-02-08 DIAGNOSIS — Z4801 Encounter for change or removal of surgical wound dressing: Secondary | ICD-10-CM | POA: Diagnosis not present

## 2024-02-11 DIAGNOSIS — Z4801 Encounter for change or removal of surgical wound dressing: Secondary | ICD-10-CM | POA: Diagnosis not present

## 2024-02-11 DIAGNOSIS — Z433 Encounter for attention to colostomy: Secondary | ICD-10-CM | POA: Diagnosis not present

## 2024-02-11 DIAGNOSIS — I5033 Acute on chronic diastolic (congestive) heart failure: Secondary | ICD-10-CM | POA: Diagnosis not present

## 2024-02-11 DIAGNOSIS — I421 Obstructive hypertrophic cardiomyopathy: Secondary | ICD-10-CM | POA: Diagnosis not present

## 2024-02-11 DIAGNOSIS — I480 Paroxysmal atrial fibrillation: Secondary | ICD-10-CM | POA: Diagnosis not present

## 2024-02-11 DIAGNOSIS — I110 Hypertensive heart disease with heart failure: Secondary | ICD-10-CM | POA: Diagnosis not present

## 2024-02-12 ENCOUNTER — Ambulatory Visit (INDEPENDENT_AMBULATORY_CARE_PROVIDER_SITE_OTHER): Payer: Medicare Other | Admitting: Family Medicine

## 2024-02-12 ENCOUNTER — Ambulatory Visit: Payer: Self-pay | Admitting: Family Medicine

## 2024-02-12 VITALS — BP 118/68 | HR 72 | Temp 98.1°F | Ht 65.0 in | Wt 216.5 lb

## 2024-02-12 DIAGNOSIS — Z6836 Body mass index (BMI) 36.0-36.9, adult: Secondary | ICD-10-CM | POA: Diagnosis not present

## 2024-02-12 DIAGNOSIS — N1831 Chronic kidney disease, stage 3a: Secondary | ICD-10-CM | POA: Diagnosis not present

## 2024-02-12 DIAGNOSIS — Z7901 Long term (current) use of anticoagulants: Secondary | ICD-10-CM

## 2024-02-12 DIAGNOSIS — I6930 Unspecified sequelae of cerebral infarction: Secondary | ICD-10-CM | POA: Diagnosis not present

## 2024-02-12 DIAGNOSIS — F4323 Adjustment disorder with mixed anxiety and depressed mood: Secondary | ICD-10-CM

## 2024-02-12 DIAGNOSIS — R350 Frequency of micturition: Secondary | ICD-10-CM | POA: Diagnosis not present

## 2024-02-12 DIAGNOSIS — I69319 Unspecified symptoms and signs involving cognitive functions following cerebral infarction: Secondary | ICD-10-CM

## 2024-02-12 DIAGNOSIS — E785 Hyperlipidemia, unspecified: Secondary | ICD-10-CM

## 2024-02-12 DIAGNOSIS — F331 Major depressive disorder, recurrent, moderate: Secondary | ICD-10-CM

## 2024-02-12 DIAGNOSIS — G4733 Obstructive sleep apnea (adult) (pediatric): Secondary | ICD-10-CM

## 2024-02-12 DIAGNOSIS — H8102 Meniere's disease, left ear: Secondary | ICD-10-CM

## 2024-02-12 DIAGNOSIS — Z933 Colostomy status: Secondary | ICD-10-CM

## 2024-02-12 DIAGNOSIS — Z7189 Other specified counseling: Secondary | ICD-10-CM | POA: Diagnosis not present

## 2024-02-12 DIAGNOSIS — F909 Attention-deficit hyperactivity disorder, unspecified type: Secondary | ICD-10-CM

## 2024-02-12 DIAGNOSIS — I484 Atypical atrial flutter: Secondary | ICD-10-CM

## 2024-02-12 DIAGNOSIS — E781 Pure hyperglyceridemia: Secondary | ICD-10-CM

## 2024-02-12 DIAGNOSIS — R5382 Chronic fatigue, unspecified: Secondary | ICD-10-CM

## 2024-02-12 DIAGNOSIS — I480 Paroxysmal atrial fibrillation: Secondary | ICD-10-CM | POA: Diagnosis not present

## 2024-02-12 DIAGNOSIS — I130 Hypertensive heart and chronic kidney disease with heart failure and stage 1 through stage 4 chronic kidney disease, or unspecified chronic kidney disease: Secondary | ICD-10-CM | POA: Diagnosis not present

## 2024-02-12 DIAGNOSIS — I421 Obstructive hypertrophic cardiomyopathy: Secondary | ICD-10-CM

## 2024-02-12 DIAGNOSIS — I5032 Chronic diastolic (congestive) heart failure: Secondary | ICD-10-CM

## 2024-02-12 DIAGNOSIS — I10 Essential (primary) hypertension: Secondary | ICD-10-CM

## 2024-02-12 DIAGNOSIS — E559 Vitamin D deficiency, unspecified: Secondary | ICD-10-CM

## 2024-02-12 DIAGNOSIS — Z8679 Personal history of other diseases of the circulatory system: Secondary | ICD-10-CM

## 2024-02-12 DIAGNOSIS — Z952 Presence of prosthetic heart valve: Secondary | ICD-10-CM

## 2024-02-12 DIAGNOSIS — D472 Monoclonal gammopathy: Secondary | ICD-10-CM

## 2024-02-12 DIAGNOSIS — E291 Testicular hypofunction: Secondary | ICD-10-CM

## 2024-02-12 LAB — POC URINALSYSI DIPSTICK (AUTOMATED)
Bilirubin, UA: NEGATIVE
Blood, UA: NEGATIVE
Glucose, UA: NEGATIVE
Ketones, UA: NEGATIVE
Leukocytes, UA: NEGATIVE
Nitrite, UA: NEGATIVE
Protein, UA: NEGATIVE
Spec Grav, UA: 1.015 (ref 1.010–1.025)
Urobilinogen, UA: 0.2 U/dL
pH, UA: 5.5 (ref 5.0–8.0)

## 2024-02-12 LAB — T3: T3, Total: 123 ng/dL (ref 76–181)

## 2024-02-12 LAB — TSH: TSH: 0.48 u[IU]/mL (ref 0.35–5.50)

## 2024-02-12 LAB — T4, FREE: Free T4: 0.71 ng/dL (ref 0.60–1.60)

## 2024-02-12 NOTE — Assessment & Plan Note (Signed)
Advanced directive planning: does not have set up. Would want wife to be HCPOA. Packet previously provided.

## 2024-02-12 NOTE — Progress Notes (Unsigned)
 Ph: [phone] Fax: [phone]   Patient ID: Dylan Fletcher, male    DOB: March 01, 1961, 63 y.o.   MRN: [id number]  This visit was conducted in person.  BP 118/68   Pulse 72   Temp 98.1 F (36.7 C) (Oral)   Ht 5' 5 (1.651 m)   Wt 216 lb 8 oz (98.2 kg)   SpO2 98%   BMI 36.03 kg/m    CC: AMW f/u visit  Subjective:   HPI: Dylan Fletcher is a 63 y.o. male presenting on 02/12/2024 for Annual Exam (Pt accompanied by his wife Hermina)   Saw health advisor 02/04/2024 for medicare wellness visit. Note reviewed.   No results found.  Flowsheet Row Office Visit from 02/12/2024 in [facility] [place]  PHQ-2 Total Score 3       02/12/2024   10:19 AM 02/04/2024    1:29 PM 12/31/2023   11:55 AM 09/11/2023   11:32 AM 08/07/2023    8:07 AM  Fall Risk   Falls in the past year? 0 1 0 1 1  Number falls in past yr: 0 0 1 0 1  Injury with Fall? 0 0 0 0 0  Risk for fall due to : No Fall Risks No Fall Risks Other (Comment)    Follow up Falls evaluation completed Falls evaluation completed       HOCM, LBBB, Pafib/atypical aflutter s/p catheter ablation 02/2023 [place]), s/p ventricular septal myectomy and MVR followed by [facility] cardiology (Dr Rosina Kerns). Known h/o meniere's ([facility]) and monoclonal B-cell gammopathy vs early CLL followed by [facility]/oncology [place]).  L thalamic stroke 09/2013 after MAZE myomectomy and MVR for HOCM and afib. Most recently sotalol stopped, now on Toprol XL with plan to restart Sotalol for recurrent arrhythmias  Hypogonadism followed by endo Dr Cherilyn on Clomid 25mg  MWF.  OSA continues auto CPAP use.    Recent hospitalization for robotic colostomy takedown - converted to open with diverting loop colostomy and midline abdominal hernia closure. Hospitalization course complicated by hypotension thrombocytopenia/anemia requiring transfusions (blood, FFP and platelet) and acute renal failure (Cr 1.9). Negative HIT panel. Had  ICU admission. Ultimately was unable to complete takedown. Chronic surgical wound continues slowly healing.   Notes slow weight gain.  Ongoing fatigue, wife notes slow improvement.  Pending repeat scan 02/2024.  On cytomel through psychiatry. Also continues sertraline, adderall, wellbutrin XL.    Preventative: COLONOSCOPY 08/2023 - 1cm polyp, diverticulosis ([facility] at [facility]) Prostate cancer screening - yearly PSA  Lung cancer screening - not eligible  Flu yearly  COVID vaccine - Moderna 06/2019, 09/2019, booster x2  Tdap 01/2013, 11/2019  Pneumovax 2016, prevnar-20 08/2021  Shingrix - 08/2021, 01/2022  Advanced directive planning: does not have set up. Would want wife to be HCPOA. Packet previously provided.  Seat belt use discussed  Sunscreen use discussed. No changing moles on skin. Saw derm last year  Non smoker  Alcohol - seldom  Dentist q4 mo  Eye exam yearly [facility] Dr Myrna) Bowel - has ostomy  Bladder - urge incontinence, urinary frequency - uses urinal bag for driving long distances.   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 Interim medical history since  our last visit reviewed. Allergies and medications reviewed and updated. Outpatient Medications Prior to Visit  Medication Sig Dispense Refill   acetaminophen  (TYLENOL ) 500 MG tablet Take 1 tablet (500 mg total) by mouth 2 (two) times daily.     acetaminophen -codeine  (TYLENOL  #3) 300-30 MG tablet Take 1 tablet by mouth every 8 (eight) hours as needed for moderate pain. 15 tablet 0   amoxicillin  (AMOXIL ) 500 MG tablet Take 500 mg by mouth as directed. Takes 4 tablets 1 hour prior to dental procedures     amphetamine-dextroamphetamine  (ADDERALL XR) 20 MG 24 hr capsule Take 20 mg by mouth daily. Takes 2 tablets (40 mg) every morning     amphetamine-dextroamphetamine (ADDERALL) 30 MG tablet Take by mouth.     buPROPion (WELLBUTRIN XL) 150 MG 24 hr tablet Take 1 tablet by mouth every morning.     Cholecalciferol 50 MCG (2000 UT) CAPS 1 capsule every day by oral route.     diphenhydrAMINE (BENADRYL) 25 mg capsule Take by mouth.     enoxaparin (LOVENOX) 100 MG/ML injection Inject 100 mg into the skin 2 (two) times daily.     famotidine (PEPCID) 20 MG tablet Take by mouth.     fenofibrate  (TRICOR ) 145 MG tablet Take 1 tablet (145 mg total) by mouth daily. 90 tablet 4   Glucosamine-Vitamin D  1000-200 MG-UNIT TABS Take 2 tablets by mouth daily.     liothyronine (CYTOMEL) 5 MCG tablet Take 2 tablets by mouth daily.     meclizine (ANTIVERT) 25 MG tablet Take by mouth.     metoprolol succinate (TOPROL-XL) 25 MG 24 hr tablet Take 25 mg by mouth daily.     Multiple Vitamin (MULTIVITAMIN) tablet Take 1 tablet by mouth daily.     rosuvastatin (CRESTOR) 10 MG tablet Take 10 mg by mouth at bedtime.     sertraline (ZOLOFT) 50 MG tablet Take 50 mg by mouth daily.     sildenafil  (VIAGRA ) 100 MG tablet Take 1 tablet (100 mg total) by mouth daily as needed for erectile dysfunction. 10 tablet 3   warfarin (COUMADIN) 5 MG tablet Take 5 mg by mouth as directed.      clomiPHENE (CLOMID) 50 MG tablet  (Patient taking differently: 25 mg three times a week)     furosemide (LASIX) 20 MG tablet Take 1 tablet (20 mg total) by mouth daily. With extra prn weight gain     losartan (COZAAR) 100 MG tablet  (Patient taking differently: Take 50 mg by mouth 2 (two) times daily.)     clomiPHENE (CLOMID) 50 MG tablet Take 0.5 tablets (25 mg total) by mouth every Monday, Wednesday, and Friday.     furosemide (LASIX) 20 MG tablet Take 1 tablet (20 mg total) by mouth every other day. With extra prn weight gain     losartan (COZAAR) 100 MG tablet Take 0.5 tablets (50 mg  total) by mouth daily.     warfarin (COUMADIN) 7.5 MG tablet SMARTSIG:0.5-1 Tablet(s) By Mouth As Directed     losartan (COZAAR) 100 MG tablet Take 0.5 tablets (50 mg total) by mouth 2 (two) times daily.     oxyCODONE  (OXY IR/ROXICODONE ) 5 MG immediate release tablet Take 5 mg by mouth. (Patient not taking: Reported on 02/12/2024)     sotalol (BETAPACE) 80 MG tablet Take 80 mg by mouth 2 (two) times daily.     No facility-administered medications prior to visit.     Per HPI unless specifically indicated in ROS section  below Review of Systems  Objective:  BP 118/68   Pulse 72   Temp 98.1 F (36.7 C) (Oral)   Ht 5' 5 (1.651 m)   Wt 216 lb 8 oz (98.2 kg)   SpO2 98%   BMI 36.03 kg/m   Wt Readings from Last 3 Encounters:  02/12/24 216 lb 8 oz (98.2 kg)  02/04/24 214 lb (97.1 kg)  12/31/23 209 lb 6 oz (95 kg)      Physical Exam Vitals and nursing note reviewed.  Constitutional:      General: He is not in acute distress.    Appearance: Normal appearance. He is well-developed. He is not ill-appearing.  HENT:     Head: Normocephalic and atraumatic.     Right Ear: Hearing, tympanic membrane, ear canal and external ear normal.     Left Ear: Hearing, tympanic membrane, ear canal and external ear normal.     Mouth/Throat:     Mouth: Mucous membranes are moist.     Pharynx: Oropharynx is clear. No oropharyngeal exudate or posterior oropharyngeal erythema.  Eyes:     General: No scleral icterus.    Extraocular Movements: Extraocular movements intact.     Conjunctiva/sclera: Conjunctivae normal.     Pupils: Pupils are equal, round, and reactive to light.  Neck:     Thyroid : No thyroid  mass or thyromegaly.  Cardiovascular:     Rate and Rhythm: Normal rate and regular rhythm.     Pulses: Normal pulses.          Radial pulses are 2+ on the right side and 2+ on the left side.     Heart sounds: Murmur (3/6 systolic mechanical murmur) heard.  Pulmonary:     Effort: Pulmonary effort is  normal. No respiratory distress.     Breath sounds: Normal breath sounds. No wheezing, rhonchi or rales.  Abdominal:     General: A surgical scar is present. The ostomy site is clean. Bowel sounds are normal. There is no distension.     Palpations: Abdomen is soft. There is no mass.     Tenderness: There is no abdominal tenderness. There is no guarding or rebound. Negative signs include Murphy's sign.     Hernia: No hernia is present.      Comments:  Colostomy present to L abdomen  Healed surgical incision midline abdomen   Musculoskeletal:        General: Normal range of motion.     Cervical back: Normal range of motion and neck supple.     Right lower leg: No edema.     Left lower leg: No edema.  Lymphadenopathy:     Cervical: No cervical adenopathy.  Skin:    General: Skin is warm and dry.     Findings: No rash.  Neurological:     General: No focal deficit present.     Mental Status: He is alert and oriented to person, place, and time.  Psychiatric:        Mood and Affect: Mood normal.        Behavior: Behavior normal.        Thought Content: Thought content normal.        Judgment: Judgment normal.       Results for orders placed or performed in visit on 02/12/24  TSH   Collection Time: 02/12/24 11:12 AM  Result Value Ref Range   TSH 0.48 0.35 - 5.50 uIU/mL  T3   Collection Time: 02/12/24 11:12 AM  Result  Value Ref Range   T3, Total 123 76 - 181 ng/dL  T4, free   Collection Time: 02/12/24 11:12 AM  Result Value Ref Range   Free T4 0.71 0.60 - 1.60 ng/dL  POCT Urinalysis Dipstick (Automated)   Collection Time: 02/12/24 11:16 AM  Result Value Ref Range   Color, UA yellow    Clarity, UA clear    Glucose, UA Negative Negative   Bilirubin, UA negative    Ketones, UA negative    Spec Grav, UA 1.015 1.010 - 1.025   Blood, UA negative    pH, UA 5.5 5.0 - 8.0   Protein, UA Negative Negative   Urobilinogen, UA 0.2 0.2 or 1.0 E.U./dL   Nitrite, UA negative     Leukocytes, UA Negative Negative   Lab Results  Component Value Date   NA 139 02/05/2024   CL 106 02/05/2024   K 4.6 02/05/2024   CO2 27 02/05/2024   BUN 25 (H) 02/05/2024   CREATININE 1.21 02/05/2024   GFR 63.07 02/05/2024   CALCIUM 9.1 02/05/2024   PHOS 3.2 02/05/2024   ALBUMIN 3.9 02/05/2024   GLUCOSE 106 (H) 02/05/2024    Lab Results  Component Value Date   ALT 15 02/05/2024   AST 21 02/05/2024   ALKPHOS 20 (L) 02/05/2024   BILITOT 0.3 02/05/2024    Lab Results  Component Value Date   WBC 11.6 (H) 02/05/2024   HGB 12.8 (L) 02/05/2024   HCT 40.0 02/05/2024   MCV 90.8 02/05/2024   PLT 252.0 02/05/2024    Lab Results  Component Value Date   CHOL 120 02/05/2024   HDL 35.10 (L) 02/05/2024   LDLCALC 66 02/05/2024   LDLDIRECT 96.0 01/14/2020   TRIG 94.0 02/05/2024   CHOLHDL 3 02/05/2024     Assessment & Plan:   Problem List Items Addressed This Visit     Advanced directives, counseling/discussion - Primary (Chronic)   Advanced directive planning: does not have set up. Would want wife to be HCPOA. Packet previously provided.       Vitamin D  deficiency   Continue vit D replacement 2000 units daily       Hypertrophic obstructive cardiomyopathy (HCC)   S/p myomectomy and MV repair.  Regularly sees [facility] cardiology team      Relevant Medications   enoxaparin (LOVENOX) 100 MG/ML injection   metoprolol succinate (TOPROL-XL) 25 MG 24 hr tablet   losartan (COZAAR) 100 MG tablet   furosemide (LASIX) 20 MG tablet   OSA (obstructive sleep apnea)   Continue nightly CPAP use.       Dyslipidemia   Chronic, stable period on crestor and fibrate. Consider trial taper off fibrate. The ASCVD Risk score (Arnett DK, et al., 2019) failed to calculate for the following reasons:   Risk score cannot be calculated because patient has a medical history suggesting prior/existing ASCVD       HTN (hypertension)   Chronic, stable. Continue current regimen.      Relevant  Medications   enoxaparin (LOVENOX) 100 MG/ML injection   metoprolol succinate (TOPROL-XL) 25 MG 24 hr tablet   losartan (COZAAR) 100 MG tablet   furosemide (LASIX) 20 MG tablet   Adjustment disorder with mixed anxiety and depressed mood   Worsened after recent acute illness /hospitalization/ ICU stay      Severe obesity (BMI 35.0-39.9) with comorbidity (HCC)   Encourage healthy diet and lifestyle choices to affect sustainable weight loss.  Weight gain noted - will drop protein  supplement to twice once daily.       H/O mitral valve replacement   History of cerebrovascular accident (CVA) with residual deficit   Monoclonal gammopathy   ?early CLL - followed by oncology yearly.       Chronic fatigue   Update thyroid  function in setting of chronic fatigue worse after recent complicated surgery/hospital stay and Cytomel use for MDD through psychiatry.       Relevant Orders   TSH (Completed)   T3 (Completed)   T4, free (Completed)   Urinary frequency   Update UA today in h/o ongoing urinary frequency regardless of lasix use.       Relevant Orders   POCT Urinalysis Dipstick (Automated) (Completed)   Meniere disease, left   Hypogonadism in male   Followed by endocrinology.       Cognitive deficit as late effect of cerebrovascular accident (CVA)   Ongoing short term memory difficulty.       Hypertriglyceridemia   See above.       Relevant Medications   enoxaparin (LOVENOX) 100 MG/ML injection   metoprolol succinate (TOPROL-XL) 25 MG 24 hr tablet   losartan (COZAAR) 100 MG tablet   furosemide (LASIX) 20 MG tablet   MDD (major depressive disorder), recurrent episode, moderate (HCC)   Continues sertraline and cytomel through psychiatry       Heart failure with preserved ejection fraction (HCC)   Seems euvolemic today.       Relevant Medications   enoxaparin (LOVENOX) 100 MG/ML injection   metoprolol succinate (TOPROL-XL) 25 MG 24 hr tablet   losartan (COZAAR) 100 MG  tablet   furosemide (LASIX) 20 MG tablet   ADHD (attention deficit hyperactivity disorder)   Followed by psychiatry on adderall.       Paroxysmal atrial fibrillation (HCC)   S/p ablation, continues coumadin.  Appreciate cardiology care       Relevant Medications   enoxaparin (LOVENOX) 100 MG/ML injection   metoprolol succinate (TOPROL-XL) 25 MG 24 hr tablet   losartan (COZAAR) 100 MG tablet   furosemide (LASIX) 20 MG tablet   Chronic anticoagulation   Presence of prosthetic heart valve   Status post Maze operation for atrial fibrillation   CKD (chronic kidney disease) stage 3, GFR 30-59 ml/min (HCC)   Latest GFR improved >60, will continue to monitor       Atypical atrial flutter (HCC)   S/p ablation, continues coumadin.  Appreciate cardiology care       Relevant Medications   enoxaparin (LOVENOX) 100 MG/ML injection   metoprolol succinate (TOPROL-XL) 25 MG 24 hr tablet   losartan (COZAAR) 100 MG tablet   furosemide (LASIX) 20 MG tablet   Colostomy status (HCC)   Colostomy c/d/i. Pending takedown.         No orders of the defined types were placed in this encounter.   Orders Placed This Encounter  Procedures   TSH   T3   T4, free   POCT Urinalysis Dipstick (Automated)    Patient Instructions  Work on advanced directive.  Urinalysis today  Thyroid  labs today.  Return in 3-4 months for follow up visit  Continue current medicines Good to see you today.   Follow up plan: Return in about 3 months (around 05/14/2024) for follow up visit.  Anton Blas, MD

## 2024-02-12 NOTE — Assessment & Plan Note (Signed)
 Update thyroid  function in setting of chronic fatigue worse after recent complicated surgery/hospital stay and Cytomel use for MDD through psychiatry.

## 2024-02-12 NOTE — Patient Instructions (Addendum)
 Work on Scientist, water quality.  Urinalysis today  Thyroid  labs today.  Return in 3-4 months for follow up visit  Continue current medicines Good to see you today.

## 2024-02-12 NOTE — Assessment & Plan Note (Signed)
 Update UA today in h/o ongoing urinary frequency regardless of lasix use.

## 2024-02-13 NOTE — Assessment & Plan Note (Addendum)
 Colostomy c/d/i. Pending takedown.

## 2024-02-13 NOTE — Assessment & Plan Note (Signed)
 S/p ablation, continues coumadin.  Appreciate cardiology care

## 2024-02-13 NOTE — Assessment & Plan Note (Signed)
Followed by psychiatry on adderall.

## 2024-02-13 NOTE — Assessment & Plan Note (Signed)
 Chronic, stable. Continue current regimen.

## 2024-02-13 NOTE — Assessment & Plan Note (Signed)
 Chronic, stable period on crestor and fibrate. Consider trial taper off fibrate. The ASCVD Risk score (Arnett DK, et al., 2019) failed to calculate for the following reasons:   Risk score cannot be calculated because patient has a medical history suggesting prior/existing ASCVD

## 2024-02-13 NOTE — Assessment & Plan Note (Addendum)
 Worsened after recent acute illness /hospitalization/ ICU stay

## 2024-02-13 NOTE — Assessment & Plan Note (Signed)
 S/p myomectomy and MV repair.  Regularly sees [facility] cardiology team

## 2024-02-13 NOTE — Assessment & Plan Note (Signed)
 Latest GFR improved >60, will continue to monitor

## 2024-02-13 NOTE — Assessment & Plan Note (Signed)
 Ongoing short term memory difficulty.

## 2024-02-13 NOTE — Assessment & Plan Note (Addendum)
 S/p ablation, continues coumadin.  Appreciate cardiology care

## 2024-02-13 NOTE — Assessment & Plan Note (Signed)
?  early CLL - followed by oncology yearly.

## 2024-02-13 NOTE — Assessment & Plan Note (Signed)
 Followed by endocrinology

## 2024-02-13 NOTE — Assessment & Plan Note (Signed)
 Continues sertraline and cytomel through psychiatry

## 2024-02-13 NOTE — Assessment & Plan Note (Signed)
Seems euvolemic today.  

## 2024-02-13 NOTE — Assessment & Plan Note (Signed)
 See above.

## 2024-02-13 NOTE — Assessment & Plan Note (Signed)
 Continue nightly CPAP use.

## 2024-02-13 NOTE — Assessment & Plan Note (Signed)
 Continue vit D replacement 2000 units daily

## 2024-02-13 NOTE — Assessment & Plan Note (Addendum)
 Encourage healthy diet and lifestyle choices to affect sustainable weight loss.  Weight gain noted - will drop protein supplement to twice once daily.

## 2024-02-19 DIAGNOSIS — H5010 Unspecified exotropia: Secondary | ICD-10-CM | POA: Diagnosis not present

## 2024-02-19 DIAGNOSIS — H2513 Age-related nuclear cataract, bilateral: Secondary | ICD-10-CM | POA: Diagnosis not present

## 2024-02-19 DIAGNOSIS — H43813 Vitreous degeneration, bilateral: Secondary | ICD-10-CM | POA: Diagnosis not present

## 2024-02-20 DIAGNOSIS — I4819 Other persistent atrial fibrillation: Secondary | ICD-10-CM | POA: Diagnosis not present

## 2024-02-27 DIAGNOSIS — I421 Obstructive hypertrophic cardiomyopathy: Secondary | ICD-10-CM | POA: Diagnosis not present

## 2024-02-27 DIAGNOSIS — I480 Paroxysmal atrial fibrillation: Secondary | ICD-10-CM | POA: Diagnosis not present

## 2024-02-29 DIAGNOSIS — I480 Paroxysmal atrial fibrillation: Secondary | ICD-10-CM | POA: Diagnosis not present

## 2024-03-04 DIAGNOSIS — Z952 Presence of prosthetic heart valve: Secondary | ICD-10-CM | POA: Diagnosis not present

## 2024-03-06 DIAGNOSIS — K8020 Calculus of gallbladder without cholecystitis without obstruction: Secondary | ICD-10-CM | POA: Diagnosis not present

## 2024-03-06 DIAGNOSIS — K760 Fatty (change of) liver, not elsewhere classified: Secondary | ICD-10-CM | POA: Diagnosis not present

## 2024-03-12 DIAGNOSIS — Z8719 Personal history of other diseases of the digestive system: Secondary | ICD-10-CM | POA: Diagnosis not present

## 2024-03-12 DIAGNOSIS — F900 Attention-deficit hyperactivity disorder, predominantly inattentive type: Secondary | ICD-10-CM | POA: Diagnosis not present

## 2024-03-12 DIAGNOSIS — I4719 Other supraventricular tachycardia: Secondary | ICD-10-CM | POA: Diagnosis not present

## 2024-03-12 DIAGNOSIS — I4892 Unspecified atrial flutter: Secondary | ICD-10-CM | POA: Diagnosis not present

## 2024-03-12 DIAGNOSIS — I440 Atrioventricular block, first degree: Secondary | ICD-10-CM | POA: Diagnosis not present

## 2024-03-12 DIAGNOSIS — I498 Other specified cardiac arrhythmias: Secondary | ICD-10-CM | POA: Diagnosis not present

## 2024-03-12 DIAGNOSIS — I421 Obstructive hypertrophic cardiomyopathy: Secondary | ICD-10-CM | POA: Diagnosis not present

## 2024-03-12 DIAGNOSIS — Z7901 Long term (current) use of anticoagulants: Secondary | ICD-10-CM | POA: Diagnosis not present

## 2024-03-12 DIAGNOSIS — I422 Other hypertrophic cardiomyopathy: Secondary | ICD-10-CM | POA: Diagnosis not present

## 2024-03-12 DIAGNOSIS — Z79899 Other long term (current) drug therapy: Secondary | ICD-10-CM | POA: Diagnosis not present

## 2024-03-12 DIAGNOSIS — Z5189 Encounter for other specified aftercare: Secondary | ICD-10-CM | POA: Diagnosis not present

## 2024-03-12 DIAGNOSIS — I468 Cardiac arrest due to other underlying condition: Secondary | ICD-10-CM | POA: Diagnosis not present

## 2024-03-12 DIAGNOSIS — F32A Depression, unspecified: Secondary | ICD-10-CM | POA: Diagnosis not present

## 2024-03-12 DIAGNOSIS — I130 Hypertensive heart and chronic kidney disease with heart failure and stage 1 through stage 4 chronic kidney disease, or unspecified chronic kidney disease: Secondary | ICD-10-CM | POA: Diagnosis not present

## 2024-03-12 DIAGNOSIS — F411 Generalized anxiety disorder: Secondary | ICD-10-CM | POA: Diagnosis not present

## 2024-03-12 DIAGNOSIS — Z0389 Encounter for observation for other suspected diseases and conditions ruled out: Secondary | ICD-10-CM | POA: Diagnosis not present

## 2024-03-12 DIAGNOSIS — I69311 Memory deficit following cerebral infarction: Secondary | ICD-10-CM | POA: Diagnosis not present

## 2024-03-12 DIAGNOSIS — I480 Paroxysmal atrial fibrillation: Secondary | ICD-10-CM | POA: Diagnosis not present

## 2024-03-12 DIAGNOSIS — D6869 Other thrombophilia: Secondary | ICD-10-CM | POA: Diagnosis not present

## 2024-03-12 DIAGNOSIS — I447 Left bundle-branch block, unspecified: Secondary | ICD-10-CM | POA: Diagnosis not present

## 2024-03-12 DIAGNOSIS — I4819 Other persistent atrial fibrillation: Secondary | ICD-10-CM | POA: Diagnosis not present

## 2024-03-12 DIAGNOSIS — I340 Nonrheumatic mitral (valve) insufficiency: Secondary | ICD-10-CM | POA: Diagnosis not present

## 2024-03-12 DIAGNOSIS — E785 Hyperlipidemia, unspecified: Secondary | ICD-10-CM | POA: Diagnosis not present

## 2024-03-12 DIAGNOSIS — R9431 Abnormal electrocardiogram [ECG] [EKG]: Secondary | ICD-10-CM | POA: Diagnosis not present

## 2024-03-12 DIAGNOSIS — F3341 Major depressive disorder, recurrent, in partial remission: Secondary | ICD-10-CM | POA: Diagnosis not present

## 2024-03-12 DIAGNOSIS — Z933 Colostomy status: Secondary | ICD-10-CM | POA: Diagnosis not present

## 2024-03-12 DIAGNOSIS — I5022 Chronic systolic (congestive) heart failure: Secondary | ICD-10-CM | POA: Diagnosis not present

## 2024-03-12 DIAGNOSIS — F419 Anxiety disorder, unspecified: Secondary | ICD-10-CM | POA: Diagnosis not present

## 2024-03-12 DIAGNOSIS — I484 Atypical atrial flutter: Secondary | ICD-10-CM | POA: Diagnosis not present

## 2024-03-12 DIAGNOSIS — C9110 Chronic lymphocytic leukemia of B-cell type not having achieved remission: Secondary | ICD-10-CM | POA: Diagnosis not present

## 2024-03-12 DIAGNOSIS — Z8673 Personal history of transient ischemic attack (TIA), and cerebral infarction without residual deficits: Secondary | ICD-10-CM | POA: Diagnosis not present

## 2024-03-12 DIAGNOSIS — I428 Other cardiomyopathies: Secondary | ICD-10-CM | POA: Diagnosis not present

## 2024-03-12 DIAGNOSIS — I10 Essential (primary) hypertension: Secondary | ICD-10-CM | POA: Diagnosis not present

## 2024-03-12 DIAGNOSIS — F909 Attention-deficit hyperactivity disorder, unspecified type: Secondary | ICD-10-CM | POA: Diagnosis not present

## 2024-03-12 DIAGNOSIS — I4891 Unspecified atrial fibrillation: Secondary | ICD-10-CM | POA: Diagnosis not present

## 2024-03-12 DIAGNOSIS — G4733 Obstructive sleep apnea (adult) (pediatric): Secondary | ICD-10-CM | POA: Diagnosis not present

## 2024-03-12 DIAGNOSIS — K219 Gastro-esophageal reflux disease without esophagitis: Secondary | ICD-10-CM | POA: Diagnosis not present

## 2024-03-12 DIAGNOSIS — I495 Sick sinus syndrome: Secondary | ICD-10-CM | POA: Diagnosis not present

## 2024-03-12 DIAGNOSIS — N189 Chronic kidney disease, unspecified: Secondary | ICD-10-CM | POA: Diagnosis not present

## 2024-03-13 DIAGNOSIS — I480 Paroxysmal atrial fibrillation: Secondary | ICD-10-CM | POA: Diagnosis not present

## 2024-03-17 DIAGNOSIS — I480 Paroxysmal atrial fibrillation: Secondary | ICD-10-CM | POA: Diagnosis not present

## 2024-03-20 ENCOUNTER — Encounter: Payer: Self-pay | Admitting: Family Medicine

## 2024-03-20 DIAGNOSIS — Z950 Presence of cardiac pacemaker: Secondary | ICD-10-CM | POA: Insufficient documentation

## 2024-03-26 DIAGNOSIS — Z5189 Encounter for other specified aftercare: Secondary | ICD-10-CM | POA: Diagnosis not present

## 2024-03-26 DIAGNOSIS — I69311 Memory deficit following cerebral infarction: Secondary | ICD-10-CM | POA: Diagnosis not present

## 2024-03-26 DIAGNOSIS — F3341 Major depressive disorder, recurrent, in partial remission: Secondary | ICD-10-CM | POA: Diagnosis not present

## 2024-03-26 DIAGNOSIS — F411 Generalized anxiety disorder: Secondary | ICD-10-CM | POA: Diagnosis not present

## 2024-03-26 DIAGNOSIS — F900 Attention-deficit hyperactivity disorder, predominantly inattentive type: Secondary | ICD-10-CM | POA: Diagnosis not present

## 2024-03-26 DIAGNOSIS — Z79899 Other long term (current) drug therapy: Secondary | ICD-10-CM | POA: Diagnosis not present

## 2024-03-26 NOTE — Discharge Summary (Signed)
 Cardiology Discharge Summary  Admit Date   03/12/2024  Discharge Date   03/26/2024  PCP   Rilla Baller, MD  Primary Cardiologist     Dr. DELENA Kerns EP -> Dr. Maree  Discharged To  Home   LOS: 13 days   Discharge Diagnosis     Atrial fibrillation       Atypical atrial flutter   Procedures Completed During This Admission   Dual-chamber AICD implanted 03/17/2024 Table 1: Location  Company Model # Serial # Date of Implant  RA  [employer] [phone] 03/17/24   RV  [employer] [id number] 03/17/24    Table 2: Location  Company Model # Serial # Date of Implant  the left deltopectoral groove  [employer] [phone] 03/17/24     Hospital Course    Dylan Fletcher is a 63 year old male with a history of hypertrophic cardiomyopathy (s/p septal myomectomy), mechanical mitral valve replacement, atrial fibrillation/atypical atrial flutter, prior CVA, hypertension, chronic kidney disease, and chronic leukocytosis, who was admitted for management of persistent atrial arrhythmias and device therapy.  Atrial Fibrillation and Atypical Atrial Flutter He was admitted for scheduled direct-current cardioversion (DCCV) for persistent atrial flutter, which resulted in asystole requiring transient transcutaneous pacing and atropine, followed by restoration of sinus rhythm with left bundle branch block morphology. During hospitalization, he experienced recurrent episodes of atrial fibrillation and atypical atrial flutter, including an episode of rapid ventricular response on 10/1, which was terminated by overdrive pacing and two boluses of IV amiodarone. He was maintained on telemetry throughout, with periods of normal sinus rhythm and atrial pacing following device implantation.  Bradyarrhythmia, Sick Sinus Syndrome, and Device Therapy He had documented bradycardia, sinus pauses, and sick sinus syndrome, with a recent Zio monitor showing minimum HR 23, maximum HR  120, and 169 pauses (3-43.7 sec). On 9/29, he underwent successful dual chamber ICD implantation for primary prevention, with intra-procedural DCCV for wide complex tachycardia and subsequent restoration of sinus rhythm. Device interrogation post-op showed normal function, and post-op chest X-ray was negative for pneumothorax. He was maintained on atrial pacing at 80-85 bpm post-implant, with no device-related complications or hematoma noted at the incision site.  Anticoagulation Management (Mechanical Mitral Valve, Afib) He was on chronic warfarin for mechanical mitral valve and atrial fibrillation, with a target INR of 2.5-3.5. Warfarin was held peri-procedurally and bridged with IV heparin, with daily INR monitoring. INR remained subtherapeutic for much of the admission, requiring continued heparin bridging and dose adjustments, including a one-time 7.5 mg warfarin dose on 10/6 and 10/7. INR reached therapeutic range (2.51) on 10/8, and heparin was discontinued. He was instructed to resume his home warfarin regimen and monitor INR post-discharge.  Hypertrophic Cardiomyopathy and Heart Failure Risk He has a history of hypertrophic cardiomyopathy (s/p septal myomectomy, EF 55-60%), with no evidence of heart failure exacerbation during admission. He was maintained on guideline-directed medical therapy, including beta-blocker (metoprolol) and ARB (losartan), with dose titration as needed.  Functional Status and Rehabilitation He required occupational and physical therapy evaluation post-procedure for unsteadiness and fall risk, with recommendations for safety precautions and education on device restrictions. He was independent with mobility and ADLs  at discharge, with no ongoing need for skilled therapy services.  Discharge Planning At discharge, he was stable, ambulating independently, and pain was well controlled. He was instructed on ICD wound care, device precautions, and anticoagulation monitoring,  with outpatient follow-up arranged for device check and INR monitoring.   Physical Examination  Temp:  [36.3 C (97.3 F)-37 C (98.6 F)] 37 C (98.6 F) Pulse:  [80] 80 Resp:  [16-18] 16 BP: (113-136)/(56-77) 113/56 SpO2:  [97 %-98 %] 98 % General:  Resting comfortably in NAD Skin:  Warm & dry Neuro:  Alert & oriented X 3.  Cooperative. HEENT:  Normocephalic, atraumatic.  Resp:  Resp even and nonlabored.  Lungs sounds clear throughout CV:  S1S2 RRR, no murmurs, gallops or rubs GI:  Soft, nontender, nondistended, NABS Ext:  No clubbing, cyanosis, or edema.  Moving all extremities. Neck:  Supple, no thyromegaly.  No JVD Pulses:  2+ and symmetrical upper and lower extremities Heme: No signs of bleeding or excessive bruising.  Most Recent Lab Results    Lab Results  Component Value Date   CKTOTAL 75.0 12/07/2023   CKTOTAL 59 10/04/2013   CKTOTAL 78 10/03/2013   CKMB 0.7 10/04/2013   CKMB 0.7 10/03/2013   CKMB 1.7 07/06/2013   TROPONINI 29 05/03/2022   TROPONINI 33 05/03/2022   TROPONINI 39 05/03/2022   Recent Labs    Units 03/26/24 0458  WBC 10*9/L 16.2*  RBC 10*12/L 4.26  HGB g/dL 87.5*  HCT % 62.2*  MCV fL 88.6  MCH pg 29.2  MCHC g/dL 67.0  RDW % 83.7*  PLT 10*9/L 258  MPV fL 8.8  . Recent Labs    Units 03/26/24 0458  NA mmol/L 142  K mmol/L 4.0  CL mmol/L 110*  BUN mg/dL 15  CREATININE mg/dL 8.71*  GLU mg/dL 86   Lab Results  Component Value Date   LDL, Direct POC 75 10/03/2022   Non-HDL 151 04/20/2020   Cholesterol, Non-HDL, Calculated 107 07/16/2020   Non HDL Chol.  92 10/03/2022   HDL, POC 39 (A) 10/03/2022   INR, POC 2.60 03/11/2024   INR 2.51 03/26/2024    Condition at Discharge   good Discharge Medications      Your Medication List     STOP taking these medications    amoxicillin  500 MG capsule Commonly known as: AMOXIL        CHANGE how you take these medications    metoPROLOL succinate 25 MG 24 hr tablet Commonly known  as: TOPROL XL Take 1 tablet (25 mg total) by mouth two (2) times a day. What changed: when to take this       CONTINUE taking these medications    acetaminophen -codeine  300-15 mg per tablet Commonly known as: TYLENOL  #2 Take 1 tablet by mouth every four (4) hours as needed for pain.   buPROPion 150 MG 24 hr tablet Commonly known as: Wellbutrin XL Take 1 tablet (150 mg total) by mouth every morning.   cholecalciferol (vitamin D3-50 mcg (2,000 unit)) 50 mcg (2,000 unit) Cap Take 1 capsule (50 mcg total) by mouth every morning.   dextroamphetamine-amphetamine 20 MG 24 hr capsule Commonly known as: ADDERALL XR Take 2 capsules (40 mg total) by mouth every morning.   famotidine 20 MG tablet Commonly known as: PEPCID Take 1 tablet (20 mg total) by mouth nightly.   fenofibrate  145 MG tablet Commonly known as: TRICOR  Take 1 tablet (145 mg total) by mouth daily.   glucosamine sulfate 1,000 mg Cap Take 1 tablet by mouth every morning.   liothyronine 5 MCG tablet Commonly known as: CYTOMEL Take 2 tablets (10 mcg total) by mouth every evening.   losartan 50 MG tablet Commonly known as: COZAAR Take 1 tablet (50 mg total) by mouth daily.   multivitamin per tablet  Commonly known as: TAB-A-VITE/THERAGRAN Take 1 tablet by mouth every evening. No vitamin K   rosuvastatin 10 MG tablet Commonly known as: CRESTOR TAKE 1 TABLET BY MOUTH EVERY DAY IN THE EVENING   sertraline 50 MG tablet Commonly known as: ZOLOFT Take 1.5 tablets (75 mg total) by mouth every morning.   sildenafil  100 MG tablet Commonly known as: VIAGRA  Take 1 tablet (100 mg total) by mouth daily as needed for erectile dysfunction.   warfarin 5 MG tablet Commonly known as: JANTOVEN Take 0.75 tablets (3.75 mg total) by mouth Monday, Wednesday, Thursday, Friday, and Sunday. Take 1 tablet by mouth daily as directed by anticoag clinic   warfarin 5 MG tablet Commonly known as: JANTOVEN Take 1 tablet (5 mg total) by  mouth 2 times a week (Tuesday and Saturday). Mon and Thur        Refer to AVS Provided to Patient    Activity Instructions   Implantable Cardioverter Defibrillator (ICD) Discharge Instructions  Care of Incision: DO NOT remove the clear dressing over the incision.  The nurse will remove this dressing at your wound check appointment.  You may shower 24 hours after your surgery. Turn the shoulder with the dressing away from the water. DO NOT let the water run directly over the incision site.  Cover the site, so it does not get wet. Pat dry with a clean towel.  DO NOT take a bath, tub soak, go in a Jacuzzi or swim in a pool, ocean, or lake for 8 weeks after your procedure.  The wound cannot be submerged in water until completely healed.   Check the incision site every day or have a family member check the site every day.  After the dressing is removed: Leave the Steri-Strips on the incision site. These are the small pieces of white tape. You may remove them as they come loose. Keep the incision clean and dry.  DO NOT put any kind of dressing over the incision site. Leave it open to air. DO NOT use:  oils, creams, ointments, powders, hydrogen peroxide or rubbing alcohol on the incision. (This includes no Neosporin or bacitracin).  DO NOT wear tight clothing that could irritate your skin over the device.  Activity:  DO NOT wear the arm immobilizer (sling) constantly.  Only wear it at night if needed. Constant use of the sling will reduce range of motion and cause arm stiffness.  DO NOT push, pull, or lift anything greater than 10 pounds with the affected arm for 8 weeks from day of surgery. DO NOT lift the affected arm above the shoulder for 8 weeks.  This gives the leads time to firmly attach to the heart tissue and allows the incision time to heal.  DO NOT drive until after your wound check appointment. DO NOT have elective surgery, procedures, or dental work until 8 weeks after your  pacemaker or defibrillator was implanted.   Avoid rough contact that could result in blows to the chest.  Diet after your procedure:   Resume heart healthy diet/previous diet  General Instructions: ICD's are built with protective shielding. The majority of items you use or will come in contact with on a daily basis will not affect the normal operation of your ICD. However, if you use a wireless telephone or cell phone, please hold it on the side opposite the ICD. Please avoid strong electromagnetic fields such as MRI scanners and automobile engine alternators. Your ICD company will mail you a permanent identification  card within 3-4 weeks. Keep this with you at all times. Tell all your doctors, surgeons, and dentist that you have an ICD. You may walk normally through [employer] in stores, libraries and airports. Your ICD may cause metal detectors to alarm. You will need to show the security personnel your ICD identification card. If a hand-held wand is used, asked the security personnel not to hold it over your ICD. You may safely use household appliances including microwave ovens. Gas powered equipment that is hand held and uses a spark plug can interact with your ICD and should be used with caution. Consider obtaining a medic alert bracelet or necklace engraved Implantable Cardiac Defibrillator.  Call your Physician's office if you notice this:   You have any drainage, bleeding, fever, chills, or increased redness, swelling, or pain (DO NOT wait until recheck appointment).  You have a fever over 100.5. If you notice anything unusual or unexpected, such as new symptoms or symptoms like the ones you had before you received your device. Hiccups that don't go away. The incision becomes red, swollen, feels hot, hard or if there is drainage. If you plan to travel or move away.  Work with your doctor to develop a follow-up plan.  If you have questions: Medical problems or questions:  [phone] Remote monitor system questions:  [phone]  Call 911 or go to the emergency room if you notice this:  Any dizziness or shortness of breath. Chest pain  Fainting or passing out        Follow Up instructions and Outpatient Referrals    Schedule Follow-up visit with Primary Care Physician     Reason for referral: pt is s/p aicd implant on 9/29 and needs an RN  incision check scheduled in ~ 10 days   Requested follow up plan: You would evaluate and manage.    Appointments which have been scheduled for you    Mar 28, 2024 11:30 AM (Arrive by 11:15 AM) INCISION CHECK CARDIOLOGY with [facility] NURSE [place] HE[facility] AND VASCULAR [address] [place] [facility]) [address] [place] [address] [phone]     Apr 08, 2024 11:00 AM (Arrive by 10:45 AM) RETURN CARDIOLOGY with Rosina Georgina Kerns, MD [facility] HEART AND VASCULAR [address] [place] [facility] [place]) [address] [place] [address] [phone]     Apr 23, 2024 8:20 AM (Arrive by 8:05 AM) OFFICE VISIT with Toribio Lamar [place], MD [place]  SURGERY [facility]) [address] [place] [address] [phone]     Jul 02, 2024 9:00 AM (Arrive by 8:45 AM) RETURN CARDIOLOGY with Morna Skates [place], ANP [place] HEART AND VASCULAR [address] [place] [facility]) [address] [place] [address] [phone]     Jul 14, 2024 3:00 PM (Arrive by 2:45 PM) AT HOME DEVICE CHECK with [facility] EP REMOTE MONITORING [place] HEART AND VASCULAR EP REMOTE MONITORING MG [facility] [place]) [address] [place] [address] [phone]  This is a remote appointment, you do not need to come into the office.          Total time spent on discharge of this patient: I personally spent 30 minutes in discharge planning  services including the coordination of care and patient education.  Camellia Goldsmith, ACNP, ACNPC-AG 03/26/2024, 2:50 PM

## 2024-03-27 ENCOUNTER — Telehealth: Payer: Self-pay

## 2024-03-27 NOTE — Transitions of Care (Post Inpatient/ED Visit) (Signed)
   03/27/2024  Name: Dylan Fletcher MRN: [id number] DOB: 1960/12/18  Today's TOC FU Call Status: Today's TOC FU Call Status:: Unsuccessful Call (1st Attempt) Unsuccessful Call (1st Attempt) Date: 03/27/24  Attempted to reach the patient regarding the most recent Inpatient/ED visit.  Follow Up Plan: Additional outreach attempts will be made to reach the patient to complete the Transitions of Care (Post Inpatient/ED visit) call.   Arvin Seip RN, BSN, CCM [employer], Population Health Case Manager Phone: [phone]

## 2024-03-27 NOTE — Telephone Encounter (Signed)
 This encounter was created in error - please disregard.

## 2024-03-28 ENCOUNTER — Telehealth: Payer: Self-pay

## 2024-03-28 NOTE — Transitions of Care (Post Inpatient/ED Visit) (Signed)
   03/28/2024  Name: Dylan Fletcher MRN: [id number] DOB: Dec 03, 1960  Today's TOC FU Call Status: Today's TOC FU Call Status:: Successful TOC FU Call Completed TOC FU Call Complete Date: 03/28/24 Patient's Name and Date of Birth confirmed.  Transition Care Management Follow-up Telephone Call   *Confirmed wife is on [facility] 01/31/23 noted in Media*  Date of Discharge: 03/26/24 Discharge Facility: Other [facility]) Name of Other (Non[facility]) Discharge Facility: [facility] Type of Discharge: Inpatient Admission Primary Inpatient Discharge Diagnosis:: Atrial Fibrillation How have you been since you were released from the hospital?: Better (but post op pain/discomfort) Any questions or concerns?: Yes Patient Questions/Concerns:: About dual pacemaker/ICD Patient Questions/Concerns Addressed: Other:  Items Reviewed: Did you receive and understand the discharge instructions provided?: Yes Medications obtained,verified, and reconciled?: Partial Review Completed Reason for Partial Mediation Review: Wife driving from [facility] [place] appt with patient (Patient states, Because I've had a stroke my memory is not good and my wife is driving us  home right now) Any new allergies since your discharge?: No  Medications Reviewed Today:  Unable to review, wife was driving states nothing has changed since [facility] except the Furosemide 20 mg every other day was not on their paper work to discuss with PCP on 04/01/24 appointment. Patient states he's had a stroke and can't remember everything correctly. Medications Reviewed Today   Medications were not reviewed in this encounter     Home Care and Equipment/Supplies: Were Home Health Services Ordered?: No  Functional Questionnaire:    Follow up appointments reviewed: PCP Follow-up appointment confirmed?: Yes Date of PCP follow-up appointment?: 04/01/24 Follow-up Provider: Rilla Baller, MD Specialist Hospital Follow-up appointment confirmed?:  Yes Date of Specialist follow-up appointment?: 04/08/24 Follow-Up Specialty Provider:: [facility] Cardiology - staples were removed today's appointment Do you need transportation to your follow-up appointment?: No Do you understand care options if your condition(s) worsen?: Yes-patient verbalized understanding  Discussed and offered 30 day TOC program.  Patient declined currently will discuss with PCP at hospital follow up, has calls coming from [facility].   The patient has been provided with contact information for the care management team and has been advised to call with any health -related questions or concerns.  The patient verbalized understanding with current plan of care.  The patient is directed to their insurance card regarding availability of benefits coverage.    Richerd Fish, RN, BSN, CCM [facility], [facility] Health RN Care Manager Direct Dial: [phone]

## 2024-03-31 DIAGNOSIS — I4891 Unspecified atrial fibrillation: Secondary | ICD-10-CM | POA: Diagnosis not present

## 2024-04-01 ENCOUNTER — Ambulatory Visit (INDEPENDENT_AMBULATORY_CARE_PROVIDER_SITE_OTHER): Admitting: Family Medicine

## 2024-04-01 ENCOUNTER — Encounter: Payer: Self-pay | Admitting: Family Medicine

## 2024-04-01 VITALS — BP 120/64 | HR 80 | Temp 97.9°F | Ht 65.0 in | Wt 218.5 lb

## 2024-04-01 DIAGNOSIS — Z23 Encounter for immunization: Secondary | ICD-10-CM

## 2024-04-01 DIAGNOSIS — I5032 Chronic diastolic (congestive) heart failure: Secondary | ICD-10-CM | POA: Diagnosis not present

## 2024-04-01 DIAGNOSIS — I484 Atypical atrial flutter: Secondary | ICD-10-CM | POA: Diagnosis not present

## 2024-04-01 DIAGNOSIS — Z8679 Personal history of other diseases of the circulatory system: Secondary | ICD-10-CM | POA: Diagnosis not present

## 2024-04-01 DIAGNOSIS — I480 Paroxysmal atrial fibrillation: Secondary | ICD-10-CM

## 2024-04-01 DIAGNOSIS — Z9889 Other specified postprocedural states: Secondary | ICD-10-CM | POA: Diagnosis not present

## 2024-04-01 DIAGNOSIS — Z950 Presence of cardiac pacemaker: Secondary | ICD-10-CM | POA: Diagnosis not present

## 2024-04-01 DIAGNOSIS — I495 Sick sinus syndrome: Secondary | ICD-10-CM

## 2024-04-01 DIAGNOSIS — Z933 Colostomy status: Secondary | ICD-10-CM | POA: Diagnosis not present

## 2024-04-01 DIAGNOSIS — Z7901 Long term (current) use of anticoagulants: Secondary | ICD-10-CM | POA: Diagnosis not present

## 2024-04-01 DIAGNOSIS — I69319 Unspecified symptoms and signs involving cognitive functions following cerebral infarction: Secondary | ICD-10-CM | POA: Diagnosis not present

## 2024-04-01 MED ORDER — ONE-DAILY MULTI VITAMINS PO TABS: 2.0000 | ORAL_TABLET | Freq: Every day | ORAL | Status: AC

## 2024-04-01 NOTE — Patient Instructions (Addendum)
 Flu shot today  Keep follow up appointment in December.  You are doing well today Good to see you today

## 2024-04-01 NOTE — Progress Notes (Unsigned)
 Ph: [phone] Fax: [phone]   Patient ID: Dylan Fletcher, male    DOB: 03/16/1961, 63 y.o.   MRN: [id number]  This visit was conducted in person.  BP 120/64   Pulse 80   Temp 97.9 F (36.6 C) (Oral)   Ht 5' 5 (1.651 m)   Wt 218 lb 8 oz (99.1 kg)   SpO2 100%   BMI 36.36 kg/m    CC: hosp f/u visit - TCM deferred to cardiology appt on 10/21. Subjective:   HPI: Dylan Fletcher is a 63 y.o. male presenting on 04/01/2024 for Hospitalization Follow-up (Pt here for a Hospital f/u . Pt has new ICD and Pacemaker.)   HOCM, LBBB, s/p MAZE ventricular septal myectomy and MVR 2015 followed by [facility] cardiology (Dr Rosina Kerns), Pafib/atypical aflutter s/p catheter ablation 02/2023 [place]). Known h/o meniere's ([facility]) and monoclonal B-cell gammopathy vs early CLL followed by heme/onc Dr Marylen at [facility], OSA on CPAP, suffered L thalamic stroke 09/2013 after myomectomy /MVR.   Recent hospitalization 12/2023 for robotic colostomy takedown - converted to open with diverting loop colostomy and midline abdominal hernia closure. Hospitalization course complicated by hypotension thrombocytopenia/anemia requiring transfusions (blood, FFP and platelet) and acute renal failure (Cr 1.9). Negative HIT panel. Had ICU admission. Ultimately was unable to complete takedown. Chronic surgical wound now fully healed, with persistent colostomy with peristomal hernia.   Latest hospitalization 02/2024 at [facility] for [employer] dual chamber defibrillator /pacemaker placement 03/17/2024 for SSS and persistent atrial arrhythmia (afib, atrial flutter) after 3d Zio monitor showed HR 23-120, with multiple pauses longest lasting 43.7 sec.  Hospital records reviewed. Med rec performed.   Since home, he is still fatigued but slowly recovering.  Latest home INR 2.2 (03/29/2024).  Wife has been so stressed during this time that she developed Takotsubo cardiomyopathy.   Home health not needed.   Other follow up appointments scheduled: cardiology Dr Kerns 04/08/2024, gen surgery 04/23/2024.  ______________________________________________________________________ Hospital admission: 03/12/2024 Hospital discharge: 03/26/2024 TCM f/u phone call:  performed on 03/28/2024  Discharge Diagnosis  Atrial fibrillation  Atypical atrial flutter   Procedures Completed During This Admission: Dual-chamber AICD implanted 03/17/2024      Relevant past medical, surgical, family and social history reviewed and updated as indicated. Interim medical history since our last visit reviewed. Allergies and medications reviewed and updated. Outpatient Medications Prior to Visit  Medication Sig Dispense Refill   diphenhydrAMINE (BENADRYL) 25 mg capsule Take by mouth.     acetaminophen  (TYLENOL ) 500 MG tablet Take 1 tablet (500 mg total) by mouth 2 (two) times daily.     acetaminophen -codeine  (TYLENOL  #3) 300-30 MG tablet Take 1 tablet by mouth every 8 (eight) hours as needed for moderate pain. 15 tablet 0   amoxicillin  (AMOXIL ) 500 MG tablet Take 500 mg by mouth as directed. Takes 4 tablets 1 hour prior to dental procedures     amphetamine-dextroamphetamine (ADDERALL XR) 20 MG 24 hr capsule Take 20 mg by mouth daily. Takes 2 tablets (40 mg) every morning     amphetamine-dextroamphetamine (ADDERALL) 30 MG tablet Take by mouth.     buPROPion (WELLBUTRIN XL) 150 MG 24 hr tablet Take 1 tablet by mouth every morning.     Cholecalciferol 50 MCG (2000 UT) CAPS 1 capsule every day by oral route.     clomiPHENE (CLOMID) 50 MG tablet Take 0.5 tablets (25 mg total) by mouth every Monday, Wednesday, and Friday.     enoxaparin (LOVENOX) 100  MG/ML injection Inject 100 mg into the skin 2 (two) times daily.     famotidine (PEPCID) 20 MG tablet Take by mouth.     fenofibrate  (TRICOR ) 145 MG tablet Take 1 tablet (145 mg total) by mouth daily. 90 tablet 4   furosemide (LASIX) 20 MG tablet Take 1 tablet (20 mg total) by mouth  every other day. With extra prn weight gain     Glucosamine-Vitamin D  1000-200 MG-UNIT TABS Take 2 tablets by mouth daily.     liothyronine (CYTOMEL) 5 MCG tablet Take 2 tablets by mouth daily.     losartan (COZAAR) 100 MG tablet Take 0.5 tablets (50 mg total) by mouth daily.     meclizine (ANTIVERT) 25 MG tablet Take by mouth.     metoprolol succinate (TOPROL-XL) 25 MG 24 hr tablet Take 1 tablet (25 mg total) by mouth in the morning and at bedtime.     rosuvastatin (CRESTOR) 10 MG tablet Take 10 mg by mouth at bedtime.     sertraline (ZOLOFT) 50 MG tablet Take 1.5 tablets (75 mg total) by mouth daily.     sildenafil  (VIAGRA ) 100 MG tablet Take 1 tablet (100 mg total) by mouth daily as needed for erectile dysfunction. 10 tablet 3   warfarin (COUMADIN) 5 MG tablet Take 5 mg by mouth as directed.      warfarin (COUMADIN) 7.5 MG tablet SMARTSIG:0.5-1 Tablet(s) By Mouth As Directed     metoprolol succinate (TOPROL-XL) 25 MG 24 hr tablet Take 25 mg by mouth daily.     Multiple Vitamin (MULTIVITAMIN) tablet Take 1 tablet by mouth daily.     sertraline (ZOLOFT) 50 MG tablet Take 50 mg by mouth daily.     No facility-administered medications prior to visit.     Per HPI unless specifically indicated in ROS section below Review of Systems  Objective:  BP 120/64   Pulse 80   Temp 97.9 F (36.6 C) (Oral)   Ht 5' 5 (1.651 m)   Wt 218 lb 8 oz (99.1 kg)   SpO2 100%   BMI 36.36 kg/m   Wt Readings from Last 3 Encounters:  04/01/24 218 lb 8 oz (99.1 kg)  02/12/24 216 lb 8 oz (98.2 kg)  02/04/24 214 lb (97.1 kg)      Physical Exam Vitals and nursing note reviewed.  Constitutional:      Appearance: Normal appearance. He is not ill-appearing.  HENT:     Head: Normocephalic and atraumatic.     Mouth/Throat:     Mouth: Mucous membranes are moist.     Pharynx: Oropharynx is clear. No oropharyngeal exudate or posterior oropharyngeal erythema.  Eyes:     Extraocular Movements: Extraocular  movements intact.     Pupils: Pupils are equal, round, and reactive to light.  Cardiovascular:     Rate and Rhythm: Normal rate and regular rhythm.     Pulses: Normal pulses.     Heart sounds: Normal heart sounds.     Comments: Sounds regular, less prominent murmur Pulmonary:     Effort: Pulmonary effort is normal. No respiratory distress.     Breath sounds: Normal breath sounds. No wheezing, rhonchi or rales.  Abdominal:     General: Bowel sounds are normal. There is no distension.     Palpations: Abdomen is soft. There is no mass.     Tenderness: There is no abdominal tenderness. There is no guarding or rebound.     Hernia: No hernia is present.  Comments: Abdominal surgical scar fully healed  Musculoskeletal:     Right lower leg: No edema.     Left lower leg: No edema.  Skin:    General: Skin is warm and dry.     Findings: No rash.  Neurological:     Mental Status: He is alert.  Psychiatric:        Mood and Affect: Mood normal.        Behavior: Behavior normal.       Results for orders placed or performed in visit on 02/12/24  TSH   Collection Time: 02/12/24 11:12 AM  Result Value Ref Range   TSH 0.48 0.35 - 5.50 uIU/mL  T3   Collection Time: 02/12/24 11:12 AM  Result Value Ref Range   T3, Total 123 76 - 181 ng/dL  T4, free   Collection Time: 02/12/24 11:12 AM  Result Value Ref Range   Free T4 0.71 0.60 - 1.60 ng/dL  POCT Urinalysis Dipstick (Automated)   Collection Time: 02/12/24 11:16 AM  Result Value Ref Range   Color, UA yellow    Clarity, UA clear    Glucose, UA Negative Negative   Bilirubin, UA negative    Ketones, UA negative    Spec Grav, UA 1.015 1.010 - 1.025   Blood, UA negative    pH, UA 5.5 5.0 - 8.0   Protein, UA Negative Negative   Urobilinogen, UA 0.2 0.2 or 1.0 E.U./dL   Nitrite, UA negative    Leukocytes, UA Negative Negative    Assessment & Plan:   Problem List Items Addressed This Visit     Warfarin anticoagulation   Cognitive  deficit as late effect of cerebrovascular accident (CVA)   Heart failure with preserved ejection fraction (HCC)   Seems euvolemic      Relevant Medications   metoprolol succinate (TOPROL-XL) 25 MG 24 hr tablet   Paroxysmal atrial fibrillation (HCC)   Relevant Medications   metoprolol succinate (TOPROL-XL) 25 MG 24 hr tablet   Status post Maze operation for atrial fibrillation   Atypical atrial flutter (HCC)   Relevant Medications   metoprolol succinate (TOPROL-XL) 25 MG 24 hr tablet   Colostomy status (HCC)   Colostomy c/d/i Will keep surg appt early next month.       S/P placement of cardiac pacemaker   SSS (sick sinus syndrome) (HCC) - Primary   Now with pacemaker/defibrillator placed 03/17/2024 Actually with improved semblance since pacemaker placement      Relevant Medications   metoprolol succinate (TOPROL-XL) 25 MG 24 hr tablet   Other Visit Diagnoses       Encounter for immunization       Relevant Orders   Flu vaccine trivalent PF, 6mos and older(Flulaval,Afluria,Fluarix,Fluzone) (Completed)        Meds ordered this encounter  Medications   Multiple Vitamin (MULTIVITAMIN) tablet    Sig: Take 2 tablets by mouth daily.    Orders Placed This Encounter  Procedures   Flu vaccine trivalent PF, 6mos and older(Flulaval,Afluria,Fluarix,Fluzone)    Patient Instructions  Flu shot today  Keep follow up appointment in December.  You are doing well today Good to see you today   Follow up plan: No follow-ups on file.  Anton Blas, MD

## 2024-04-02 DIAGNOSIS — G43009 Migraine without aura, not intractable, without status migrainosus: Secondary | ICD-10-CM | POA: Diagnosis not present

## 2024-04-02 DIAGNOSIS — I4819 Other persistent atrial fibrillation: Secondary | ICD-10-CM | POA: Diagnosis not present

## 2024-04-02 DIAGNOSIS — R4189 Other symptoms and signs involving cognitive functions and awareness: Secondary | ICD-10-CM | POA: Diagnosis not present

## 2024-04-03 DIAGNOSIS — I495 Sick sinus syndrome: Secondary | ICD-10-CM | POA: Insufficient documentation

## 2024-04-03 NOTE — Assessment & Plan Note (Signed)
 Colostomy c/d/i Will keep surg appt early next month.

## 2024-04-03 NOTE — Assessment & Plan Note (Addendum)
 Now with pacemaker/defibrillator placed 03/17/2024 Actually with improved semblance since pacemaker placement

## 2024-04-03 NOTE — Assessment & Plan Note (Signed)
 Seems euvolemic.

## 2024-04-06 ENCOUNTER — Other Ambulatory Visit: Payer: Self-pay | Admitting: Family Medicine

## 2024-04-06 DIAGNOSIS — E785 Hyperlipidemia, unspecified: Secondary | ICD-10-CM

## 2024-04-06 DIAGNOSIS — E781 Pure hyperglyceridemia: Secondary | ICD-10-CM

## 2024-04-08 DIAGNOSIS — Z952 Presence of prosthetic heart valve: Secondary | ICD-10-CM | POA: Diagnosis not present

## 2024-04-08 DIAGNOSIS — F4310 Post-traumatic stress disorder, unspecified: Secondary | ICD-10-CM | POA: Diagnosis not present

## 2024-04-08 DIAGNOSIS — Z950 Presence of cardiac pacemaker: Secondary | ICD-10-CM | POA: Diagnosis not present

## 2024-04-08 DIAGNOSIS — E782 Mixed hyperlipidemia: Secondary | ICD-10-CM | POA: Diagnosis not present

## 2024-04-08 DIAGNOSIS — I421 Obstructive hypertrophic cardiomyopathy: Secondary | ICD-10-CM | POA: Diagnosis not present

## 2024-04-09 DIAGNOSIS — Z952 Presence of prosthetic heart valve: Secondary | ICD-10-CM | POA: Diagnosis not present

## 2024-04-09 DIAGNOSIS — I4891 Unspecified atrial fibrillation: Secondary | ICD-10-CM | POA: Diagnosis not present

## 2024-04-23 DIAGNOSIS — Z933 Colostomy status: Secondary | ICD-10-CM | POA: Diagnosis not present

## 2024-04-23 DIAGNOSIS — I480 Paroxysmal atrial fibrillation: Secondary | ICD-10-CM | POA: Diagnosis not present

## 2024-05-13 DIAGNOSIS — I4819 Other persistent atrial fibrillation: Secondary | ICD-10-CM | POA: Diagnosis not present

## 2024-05-13 DIAGNOSIS — Z952 Presence of prosthetic heart valve: Secondary | ICD-10-CM | POA: Diagnosis not present

## 2024-05-27 DIAGNOSIS — Z79899 Other long term (current) drug therapy: Secondary | ICD-10-CM | POA: Diagnosis not present

## 2024-05-27 DIAGNOSIS — F411 Generalized anxiety disorder: Secondary | ICD-10-CM | POA: Diagnosis not present

## 2024-05-27 DIAGNOSIS — F900 Attention-deficit hyperactivity disorder, predominantly inattentive type: Secondary | ICD-10-CM | POA: Diagnosis not present

## 2024-06-16 ENCOUNTER — Ambulatory Visit: Admitting: Family Medicine

## 2024-06-16 VITALS — BP 106/68 | HR 106 | Temp 98.1°F | Ht 65.0 in | Wt 224.6 lb

## 2024-06-16 DIAGNOSIS — N183 Chronic kidney disease, stage 3 unspecified: Secondary | ICD-10-CM | POA: Diagnosis not present

## 2024-06-16 DIAGNOSIS — Z933 Colostomy status: Secondary | ICD-10-CM

## 2024-06-16 DIAGNOSIS — I480 Paroxysmal atrial fibrillation: Secondary | ICD-10-CM | POA: Diagnosis not present

## 2024-06-16 DIAGNOSIS — Z7901 Long term (current) use of anticoagulants: Secondary | ICD-10-CM

## 2024-06-16 DIAGNOSIS — N1831 Chronic kidney disease, stage 3a: Secondary | ICD-10-CM

## 2024-06-16 DIAGNOSIS — R600 Localized edema: Secondary | ICD-10-CM

## 2024-06-16 NOTE — Progress Notes (Unsigned)
 " Ph: [phone] Fax: [phone]   Patient ID: Dylan Fletcher, male    DOB: 12-Aug-1960, 63 y.o.   MRN: [id number]  This visit was conducted in person.  BP 106/68 (BP Location: Right Arm, Patient Position: Sitting, Cuff Size: Normal)   Pulse (!) 106   Temp 98.1 F (36.7 C) (Oral)   Ht 5' 5 (1.651 m)   Wt 224 lb 9.6 oz (101.9 kg)   SpO2 98%   BMI 37.38 kg/m   BP Readings from Last 3 Encounters:  06/16/24 106/68  04/01/24 120/64  02/12/24 118/68   CC: 3 mo f/u visit  Subjective:   HPI: Dylan Fletcher is a 63 y.o. male presenting on 06/16/2024 for Medical Management of Chronic Issues (Accidentally missed warfin dose and his INR crashed so [facility] put him on meds/Pt also had pitting edema in left leg///Pt and wife said they havent done anything as far as advanced directives/Dylan Fletcher, Pt's wife, is in room with Pt)   Care team, mostly at [facility]: PCP: Dr Rilla GI: Dr Andrey Surgeon: Dr Cristi Cardiology: Dr Ezzard EP: Dr GORMAN Fairly Pulmonology: Dr LELON Hurst Neurology: Dr Norleen Lunger Neuropsychiatry: Dr Gwenda Endocrinology: Dr Cherilyn  See prior notes for details.  HOCM, LBBB, s/p MAZE ventricular septal myectomy and MVR 2015 followed by [facility] cardiology (Dr Rosina Ezzard), Pafib/atypical aflutter s/p catheter ablation 02/2023 Dylan Fletcher). Latest PPM placement 02/2024 for SSS. Known h/o meniere's ([facility]) and monoclonal B-cell gammopathy vs early CLL followed by heme/onc Dr Marylen at [facility], OSA on CPAP, suffered L thalamic stroke 09/2013 after myomectomy /MVR.    Last week missed 1 dose of coumadin. INR dropped to 1.7 - coumadin clinic started lovenox injections while levels go up.  Since last seen, has developing edema L>R leg.  Had venous US  done last Friday negative for DVT. Did show enlarged inguinal lymph nodes of unclear etiology - no urinary symptoms, no discharge, LE cellulitis.  Currently on lasix 20mg  2 tablets daily for 3-5 days through cardiology  instructions.   LLE Venous ultrasound 06/13/2024: Right There is no evidence of obstruction proximal to the inguinal ligament or in the common femoral vein. Left There is no evidence of DVT in the lower extremity. There is no evidence of obstruction proximal to the inguinal ligament or in the common femoral vein. Enlarged lymph nodes visualized in the groin.   Planned colostomy reversal /periostomal hernia repair 07/2024.   Sister-in-law just diagnosed with stage 3 ovarian cancer     Relevant past medical, surgical, family and social history reviewed and updated as indicated. Interim medical history since our last visit reviewed. Allergies and medications reviewed and updated. Outpatient Medications Prior to Visit  Medication Sig Dispense Refill   acetaminophen  (TYLENOL ) 500 MG tablet Take 1 tablet (500 mg total) by mouth 2 (two) times daily.     acetaminophen -codeine  (TYLENOL  #3) 300-30 MG tablet Take 1 tablet by mouth every 8 (eight) hours as needed for moderate pain. 15 tablet 0   amoxicillin  (AMOXIL ) 500 MG tablet Take 500 mg by mouth as directed. Takes 4 tablets 1 hour prior to dental procedures     amphetamine-dextroamphetamine (ADDERALL XR) 20 MG 24 hr capsule Take 20 mg by mouth daily. Takes 2 tablets (40 mg) every morning     amphetamine-dextroamphetamine (ADDERALL) 30 MG tablet Take by mouth.     buPROPion (WELLBUTRIN XL) 150 MG 24 hr tablet Take 1 tablet by mouth every morning.  Cholecalciferol 50 MCG (2000 UT) CAPS 1 capsule every day by oral route.     clomiPHENE (CLOMID) 50 MG tablet Take 0.5 tablets (25 mg total) by mouth every Monday, Wednesday, and Friday.     diphenhydrAMINE (BENADRYL) 25 mg capsule Take by mouth. (Patient taking differently: Take by mouth as needed.)     enoxaparin (LOVENOX) 100 MG/ML injection Inject 100 mg into the skin 2 (two) times daily.     famotidine (PEPCID) 20 MG tablet Take by mouth.     fenofibrate  (TRICOR ) 145 MG tablet TAKE 1 TABLET BY  MOUTH EVERY DAY 90 tablet 4   furosemide (LASIX) 20 MG tablet Take 1 tablet (20 mg total) by mouth every other day. With extra prn weight gain     Glucosamine-Vitamin D  1000-200 MG-UNIT TABS Take 2 tablets by mouth daily.     liothyronine (CYTOMEL) 5 MCG tablet Take 2 tablets by mouth daily.     losartan (COZAAR) 100 MG tablet Take 0.5 tablets (50 mg total) by mouth daily.     meclizine (ANTIVERT) 25 MG tablet Take by mouth.     metoprolol succinate (TOPROL-XL) 25 MG 24 hr tablet Take 1 tablet (25 mg total) by mouth in the morning and at bedtime.     Multiple Vitamin (MULTIVITAMIN) tablet Take 2 tablets by mouth daily.     rosuvastatin (CRESTOR) 10 MG tablet Take 10 mg by mouth at bedtime.     sertraline (ZOLOFT) 50 MG tablet Take 1.5 tablets (75 mg total) by mouth daily.     sildenafil  (VIAGRA ) 100 MG tablet Take 1 tablet (100 mg total) by mouth daily as needed for erectile dysfunction. 10 tablet 3   warfarin (COUMADIN) 5 MG tablet Take 5 mg by mouth as directed.      warfarin (COUMADIN) 7.5 MG tablet SMARTSIG:0.5-1 Tablet(s) By Mouth As Directed     No facility-administered medications prior to visit.     Per HPI unless specifically indicated in ROS section below Review of Systems  Objective:  BP 106/68 (BP Location: Right Arm, Patient Position: Sitting, Cuff Size: Normal)   Pulse (!) 106   Temp 98.1 F (36.7 C) (Oral)   Ht 5' 5 (1.651 m)   Wt 224 lb 9.6 oz (101.9 kg)   SpO2 98%   BMI 37.38 kg/m   Wt Readings from Last 3 Encounters:  06/16/24 224 lb 9.6 oz (101.9 kg)  04/01/24 218 lb 8 oz (99.1 kg)  02/12/24 216 lb 8 oz (98.2 kg)      Physical Exam Vitals and nursing note reviewed.  Constitutional:      Appearance: Normal appearance. He is not ill-appearing.  HENT:     Head: Normocephalic and atraumatic.     Mouth/Throat:     Mouth: Mucous membranes are moist.     Pharynx: Oropharynx is clear. No oropharyngeal exudate or posterior oropharyngeal erythema.  Eyes:      Extraocular Movements: Extraocular movements intact.     Pupils: Pupils are equal, round, and reactive to light.  Cardiovascular:     Rate and Rhythm: Regular rhythm. Tachycardia present.     Pulses: Normal pulses.     Heart sounds: Murmur heard.  Pulmonary:     Effort: Pulmonary effort is normal. No respiratory distress.     Breath sounds: Normal breath sounds. No wheezing, rhonchi or rales.  Musculoskeletal:     Right lower leg: Edema (trace) present.     Left lower leg: Edema (trace) present.  Comments:  Wearing knee high compression stockings  Skin:    General: Skin is warm and dry.     Findings: No rash.  Psychiatric:        Mood and Affect: Mood normal.        Behavior: Behavior normal.       Results for orders placed or performed in visit on 02/12/24  TSH   Collection Time: 02/12/24 11:12 AM  Result Value Ref Range   TSH 0.48 0.35 - 5.50 uIU/mL  T3   Collection Time: 02/12/24 11:12 AM  Result Value Ref Range   T3, Total 123 76 - 181 ng/dL  T4, free   Collection Time: 02/12/24 11:12 AM  Result Value Ref Range   Free T4 0.71 0.60 - 1.60 ng/dL  POCT Urinalysis Dipstick (Automated)   Collection Time: 02/12/24 11:16 AM  Result Value Ref Range   Color, UA yellow    Clarity, UA clear    Glucose, UA Negative Negative   Bilirubin, UA negative    Ketones, UA negative    Spec Grav, UA 1.015 1.010 - 1.025   Blood, UA negative    pH, UA 5.5 5.0 - 8.0   Protein, UA Negative Negative   Urobilinogen, UA 0.2 0.2 or 1.0 E.U./dL   Nitrite, UA negative    Leukocytes, UA Negative Negative   Lab Results  Component Value Date   NA 139 02/05/2024   CL 106 02/05/2024   K 4.6 02/05/2024   CO2 27 02/05/2024   BUN 25 (H) 02/05/2024   CREATININE 1.21 02/05/2024   GFR 63.07 02/05/2024   CALCIUM 9.1 02/05/2024   PHOS 3.2 02/05/2024   ALBUMIN 3.9 02/05/2024   GLUCOSE 106 (H) 02/05/2024    Lab Results  Component Value Date   WBC 11.6 (H) 02/05/2024   HGB 12.8 (L)  02/05/2024   HCT 40.0 02/05/2024   MCV 90.8 02/05/2024   PLT 252.0 02/05/2024   Assessment & Plan:   Problem List Items Addressed This Visit     Warfarin anticoagulation   Followed by [facility] coumadin clinic  Recent subtherapeutic INR, now on lovenox.       Paroxysmal atrial fibrillation (HCC)   S/p ablation Continues warfarin and currently on lovenox for subtherapeutic INR.  Remaining mildly tachycardic despite Toprol XL 25mg  BID, BP somewhat limits titration.       CKD (chronic kidney disease) stage 3, GFR 30-59 ml/min (HCC)   Latest GFR 55.  Now on lasix.  They will try to update labs towards end of week with more regular lasix use.       Colostomy status (HCC)   Discussing  colostomy reversal /periostomal hernia repair 07/2024.       Pedal edema - Primary   New, mild. He has started compression stocking use with benefit.  He is also on lasix 40mg  daily. Latest renal function mildly deteriorated with Cr 1.42 and GFR down to 55. Given mild pedal edema present, suggested dropping lasix dose to 20mg  daily. He is drinking about 2 quarts/day.        No orders of the defined types were placed in this encounter.   No orders of the defined types were placed in this encounter.   Patient Instructions  Take lasix daily for the next 5 days.  Increase fluid intake by 1-2 glasses a day while increasing lasix Try to get labs done at end of the week.  Good to see you today.  Return to see me  in 4 months for follow up visit   Follow up plan: No follow-ups on file.  Anton Blas, MD   "

## 2024-06-16 NOTE — Patient Instructions (Addendum)
 Take lasix daily for the next 5 days.  Increase fluid intake by 1-2 glasses a day while increasing lasix Try to get labs done at end of the week.  Good to see you today.  Return to see me in 4 months for follow up visit

## 2024-06-18 NOTE — Assessment & Plan Note (Signed)
 S/p ablation Continues warfarin Currently on lovenox for subtherapeutic INR.

## 2024-06-19 DIAGNOSIS — R600 Localized edema: Secondary | ICD-10-CM | POA: Insufficient documentation

## 2024-06-19 NOTE — Assessment & Plan Note (Addendum)
 Latest GFR 55.  Now on lasix.  They will try to update labs towards end of week with more regular lasix use.

## 2024-06-19 NOTE — Assessment & Plan Note (Addendum)
 New, mild. He has started compression stocking use with benefit.  He is also on lasix 40mg  daily. Latest renal function mildly deteriorated with Cr 1.42 and GFR down to 55. Given mild pedal edema present, suggested dropping lasix dose to 20mg  daily. He is drinking about 2 quarts/day.

## 2024-06-19 NOTE — Assessment & Plan Note (Addendum)
 Followed by [facility] coumadin clinic  Recent subtherapeutic INR, now on lovenox.

## 2024-06-19 NOTE — Assessment & Plan Note (Signed)
 Discussing  colostomy reversal /periostomal hernia repair 07/2024.

## 2024-07-15 ENCOUNTER — Telehealth: Payer: Self-pay

## 2024-07-15 NOTE — Transitions of Care (Post Inpatient/ED Visit) (Signed)
" ° °  07/15/2024  Name: Dylan Fletcher MRN: [id number] DOB: Sep 09, 1960  Today's TOC FU Call Status: Today's TOC FU Call Status:: Unsuccessful Call (1st Attempt) Unsuccessful Call (1st Attempt) Date: 07/15/24  Attempted to reach the patient regarding the most recent Inpatient/ED visit.  Follow Up Plan: Additional outreach attempts will be made to reach the patient to complete the Transitions of Care (Post Inpatient/ED visit) call.   Arvin Seip RN, BSN, CCM [employer], Population Health Case Manager Phone: [phone]  "

## 2024-07-17 ENCOUNTER — Telehealth: Payer: Self-pay | Admitting: *Deleted

## 2024-07-17 ENCOUNTER — Telehealth: Payer: Self-pay

## 2024-07-17 DIAGNOSIS — Z8679 Personal history of other diseases of the circulatory system: Secondary | ICD-10-CM

## 2024-07-17 NOTE — Transitions of Care (Post Inpatient/ED Visit) (Signed)
 "  07/17/2024  Name: Dylan Fletcher MRN: [id number] DOB: [birth year]  Today's TOC FU Call Status: Today's TOC FU Call Status:: Successful TOC FU Call Completed TOC FU Call Complete Date: 07/17/24  Patient's Name and Date of Birth confirmed. Name, DOB (Verified with DPR/[name])  Transition Care Management Follow-up Telephone Call Date of Discharge: 07/14/24 Discharge Facility: Other ([facility]) Name of Other ([facility]) Discharge Facility: [facility] Type of Discharge: Inpatient Admission Primary Inpatient Discharge Diagnosis:: tikosyn load atrial fibrillation How have you been since you were released from the hospital?: Better Any questions or concerns?: Yes Patient Questions/Concerns:: DPR has questions regarding medications and would like a pharmacy referral to review possible interactions Patient Questions/Concerns Addressed: Other: (Pharmacy referral placed)  Items Reviewed: Did you receive and understand the discharge instructions provided?: Yes Medications obtained,verified, and reconciled?: Yes (Medications Reviewed) Any new allergies since your discharge?: No Dietary orders reviewed?: Yes Type of Diet Ordered:: Heart Healthy Do you have support at home?: Yes People in Home [RPT]: spouse Name of Support/Comfort Primary Source: spouse/[name]  Medications Reviewed Today: Medications Reviewed Today     Reviewed by Lucky Andrea LABOR, RN (Registered Nurse) on 07/17/24 at 1231  Med List Status: <None>   Medication Order Taking? Sig Documenting Provider Last Dose Status Informant  acetaminophen  (TYLENOL ) 500 MG tablet [id number] Yes Take 1 tablet (500 mg total) by mouth 2 (two) times daily. Rilla Baller, MD  Active   acetaminophen -codeine  (TYLENOL  #3) 300-30 MG tablet [id number] Yes Take 1 tablet by mouth every 8 (eight) hours as needed for moderate pain. Rilla Baller, MD  Active   amoxicillin  (AMOXIL ) 500 MG tablet [id number] Yes Take 500 mg by mouth as  directed. Takes 4 tablets 1 hour prior to dental procedures [provider]  Active   amphetamine-dextroamphetamine (ADDERALL XR) 20 MG 24 hr capsule [id number] Yes Take 20 mg by mouth daily. Takes 2 tablets (40 mg) every morning [provider]  Active Self  amphetamine-dextroamphetamine (ADDERALL) 30 MG tablet [id number] Yes Take by mouth. [provider]  Active   buPROPion (WELLBUTRIN XL) 150 MG 24 hr tablet [id number] Yes Take 1 tablet by mouth every morning.  Patient taking differently: Take 2 tablets by mouth every morning.   [provider]  Active   Cholecalciferol 50 MCG (2000 UT) CAPS [id number] Yes 1 capsule every day by oral route. [provider]  Active   clomiPHENE (CLOMID) 50 MG tablet [id number] Yes Take 0.5 tablets (25 mg total) by mouth every Monday, Wednesday, and Friday. Rilla Baller, MD  Active   diphenhydrAMINE (BENADRYL) 25 mg capsule [id number] Yes Take by mouth.  Patient taking differently: Take by mouth as needed.   [provider]  Active   dofetilide (TIKOSYN) 250 MCG capsule [id number] Yes Take 250 mcg by mouth 2 (two) times daily. [provider]  Active   enoxaparin (LOVENOX) 100 MG/ML injection [id number] Yes Inject 100 mg into the skin 2 (two) times daily.  Patient taking differently: Inject 100 mg into the skin 2 (two) times daily. Takes as directed or prior to surgery   [provider]  Active   famotidine (PEPCID) 20 MG tablet [id number] Yes Take by mouth. [provider]  Active   fenofibrate  (TRICOR ) 145 MG tablet [id number] Yes TAKE 1 TABLET BY MOUTH EVERY DAY Rilla Baller, MD  Active   furosemide (LASIX) 20 MG tablet [id number] Yes Take 1 tablet (20 mg total) by mouth every other day. With extra prn weight gain  Rilla Baller, MD  Active   Glucosamine-Vitamin D  1000-200 MG-UNIT TABS [id number] Yes Take 2 tablets by mouth daily. Rilla Baller, MD  Active   liothyronine  (CYTOMEL) 5 MCG tablet [id number] Yes Take 2 tablets by mouth daily. [provider]  Active   losartan (COZAAR) 100 MG tablet [id number] Yes Take 0.5 tablets (50 mg total) by mouth daily. Rilla Baller, MD  Active   meclizine (ANTIVERT) 25 MG tablet [id number] Yes Take by mouth. [provider]  Active   metoprolol succinate (TOPROL-XL) 25 MG 24 hr tablet [id number] Yes Take 1 tablet (25 mg total) by mouth in the morning and at bedtime. Rilla Baller, MD  Active   Multiple Vitamin (MULTIVITAMIN) tablet [id number] Yes Take 2 tablets by mouth daily. Rilla Baller, MD  Active   rosuvastatin (CRESTOR) 10 MG tablet [id number] Yes Take 10 mg by mouth at bedtime. [provider]  Active   sertraline (ZOLOFT) 50 MG tablet [id number] Yes Take 1.5 tablets (75 mg total) by mouth daily. Rilla Baller, MD  Active   sildenafil  (VIAGRA ) 100 MG tablet [id number] Yes Take 1 tablet (100 mg total) by mouth daily as needed for erectile dysfunction. Rilla Baller, MD  Active   warfarin (COUMADIN) 5 MG tablet [id number] Yes Take 5 mg by mouth as directed.  [provider]  Active   warfarin (COUMADIN) 7.5 MG tablet [id number] Yes SMARTSIG:0.5-1 Tablet(s) By Mouth As Directed [provider]  Active             Home Care and Equipment/Supplies: Were Home Health Services Ordered?: No Any new equipment or medical supplies ordered?: No  Functional Questionnaire: Do you need assistance with bathing/showering or dressing?: No Do you need assistance with meal preparation?: No Do you need assistance with eating?: No Do you have difficulty maintaining continence: No Do you need assistance with getting out of bed/getting out of a chair/moving?: No Do you have difficulty managing or taking your medications?: Yes (spouse assists)  Follow up appointments reviewed: PCP Follow-up appointment confirmed?: No (Spouse will call and schedule) MD Provider Line  Number:[phone] Given: No Specialist Hospital Follow-up appointment confirmed?: Yes Date of Specialist follow-up appointment?: 07/30/24 Follow-Up Specialty Provider:: Heart [facility] V[facility] Do you need transportation to your follow-up appointment?: No Do you understand care options if your condition(s) worsen?: Yes-patient verbalized understanding  SDOH Interventions Today    Flowsheet Row Most Recent Value  SDOH Interventions   Food Insecurity Interventions Intervention Not Indicated  Housing Interventions Intervention Not Indicated  Transportation Interventions Intervention Not Indicated  Utilities Interventions Intervention Not Indicated    Andrea Dimes RN, BSN [place]  [facility] RN Care Manager [phone]  "

## 2024-07-17 NOTE — Progress Notes (Signed)
 Complex Care Management Note Care Guide Note  07/17/2024 Name: Dylan Fletcher MRN: [id number] DOB: Jul 14, 1960   Complex Care Management Outreach Attempts: A second unsuccessful outreach was attempted today to offer the patient with information about available complex care management services.  Follow Up Plan:  Additional outreach attempts will be made to offer the patient complex care management information and services.   Encounter Outcome:  No Answer  [facility]  [facility], [facility] VBCI Assistant Direct Dial: [phone]  Fax: [phone]

## 2024-07-18 NOTE — Progress Notes (Unsigned)
 Complex Care Management Note Care Guide Note  07/18/2024 Name: Dylan Fletcher MRN: [id number] DOB: May 20, 1961   Complex Care Management Outreach Attempts: A second unsuccessful outreach was attempted today to offer the patient with information about available complex care management services.  Follow Up Plan:  Additional outreach attempts will be made to offer the patient complex care management information and services.   Encounter Outcome:  No Answer  Dreama [facility]  [facility], [facility] VBCI Assistant Direct Dial: [phone]  Fax: [phone]

## 2024-07-21 NOTE — Progress Notes (Signed)
 Complex Care Management Note Care Guide Note  07/21/2024 Name: Dylan Fletcher MRN: [id number] DOB: 03-11-1961   Complex Care Management Outreach Attempts: A third unsuccessful outreach was attempted today to offer the patient with information about available complex care management services.  Follow Up Plan:  No further outreach attempts will be made at this time. We have been unable to contact the patient to offer or enroll patient in complex care management services.  Encounter Outcome:  No Answer  [facility]ream[facility]  [facility], [facility] VBCI Assistant Direct Dial: [phone]  Fax: [phone]

## 2024-07-28 ENCOUNTER — Inpatient Hospital Stay: Admitting: Family Medicine

## 2024-07-29 ENCOUNTER — Inpatient Hospital Stay: Admitting: Family Medicine

## 2024-10-14 ENCOUNTER — Ambulatory Visit: Admitting: Family Medicine

## 2025-02-04 ENCOUNTER — Ambulatory Visit
